# Patient Record
Sex: Female | Born: 1935 | Race: White | Hispanic: No | Smoking: Never smoker
Health system: Southern US, Community
[De-identification: ages and names within clinical notes are randomized; demographics above are authoritative.]

## PROBLEM LIST (undated history)

## (undated) DIAGNOSIS — E785 Hyperlipidemia, unspecified: Secondary | ICD-10-CM

## (undated) DIAGNOSIS — I614 Nontraumatic intracerebral hemorrhage in cerebellum: Secondary | ICD-10-CM

## (undated) DIAGNOSIS — I2510 Atherosclerotic heart disease of native coronary artery without angina pectoris: Secondary | ICD-10-CM

## (undated) DIAGNOSIS — S32599A Other specified fracture of unspecified pubis, initial encounter for closed fracture: Secondary | ICD-10-CM

## (undated) DIAGNOSIS — N6019 Diffuse cystic mastopathy of unspecified breast: Secondary | ICD-10-CM

## (undated) DIAGNOSIS — N182 Chronic kidney disease, stage 2 (mild): Secondary | ICD-10-CM

## (undated) DIAGNOSIS — I4891 Unspecified atrial fibrillation: Secondary | ICD-10-CM

## (undated) DIAGNOSIS — D126 Benign neoplasm of colon, unspecified: Secondary | ICD-10-CM

## (undated) DIAGNOSIS — I5032 Chronic diastolic (congestive) heart failure: Secondary | ICD-10-CM

## (undated) DIAGNOSIS — D509 Iron deficiency anemia, unspecified: Secondary | ICD-10-CM

## (undated) DIAGNOSIS — I071 Rheumatic tricuspid insufficiency: Secondary | ICD-10-CM

## (undated) DIAGNOSIS — I4821 Permanent atrial fibrillation: Secondary | ICD-10-CM

## (undated) DIAGNOSIS — I509 Heart failure, unspecified: Secondary | ICD-10-CM

## (undated) DIAGNOSIS — I272 Pulmonary hypertension, unspecified: Secondary | ICD-10-CM

## (undated) DIAGNOSIS — I82409 Acute embolism and thrombosis of unspecified deep veins of unspecified lower extremity: Secondary | ICD-10-CM

## (undated) DIAGNOSIS — C539 Malignant neoplasm of cervix uteri, unspecified: Secondary | ICD-10-CM

## (undated) DIAGNOSIS — K2270 Barrett's esophagus without dysplasia: Secondary | ICD-10-CM

## (undated) DIAGNOSIS — H3530 Unspecified macular degeneration: Secondary | ICD-10-CM

## (undated) DIAGNOSIS — I10 Essential (primary) hypertension: Secondary | ICD-10-CM

## (undated) DIAGNOSIS — S329XXA Fracture of unspecified parts of lumbosacral spine and pelvis, initial encounter for closed fracture: Secondary | ICD-10-CM

## (undated) HISTORY — DX: Nontraumatic intracerebral hemorrhage in cerebellum: I61.4

## (undated) HISTORY — DX: Heart failure, unspecified: I50.9

## (undated) HISTORY — DX: Fracture of unspecified parts of lumbosacral spine and pelvis, initial encounter for closed fracture: S32.9XXA

## (undated) HISTORY — DX: Benign neoplasm of colon, unspecified: D12.6

## (undated) HISTORY — DX: Essential (primary) hypertension: I10

## (undated) HISTORY — DX: Other specified fracture of unspecified pubis, initial encounter for closed fracture: S32.599A

## (undated) HISTORY — DX: Acute embolism and thrombosis of unspecified deep veins of unspecified lower extremity: I82.409

## (undated) HISTORY — DX: Malignant neoplasm of cervix uteri, unspecified: C53.9

## (undated) HISTORY — DX: Diffuse cystic mastopathy of unspecified breast: N60.19

## (undated) HISTORY — DX: Barrett's esophagus without dysplasia: K22.70

## (undated) HISTORY — DX: Iron deficiency anemia, unspecified: D50.9

## (undated) HISTORY — DX: Atherosclerotic heart disease of native coronary artery without angina pectoris: I25.10

## (undated) HISTORY — DX: Unspecified atrial fibrillation: I48.91

## (undated) HISTORY — DX: Hyperlipidemia, unspecified: E78.5

---

## 1962-10-04 HISTORY — PX: TOTAL ABDOMINAL HYSTERECTOMY: SHX209

## 2005-10-04 DIAGNOSIS — K2270 Barrett's esophagus without dysplasia: Secondary | ICD-10-CM

## 2005-10-04 DIAGNOSIS — D126 Benign neoplasm of colon, unspecified: Secondary | ICD-10-CM

## 2005-10-04 HISTORY — DX: Benign neoplasm of colon, unspecified: D12.6

## 2005-10-04 HISTORY — DX: Barrett's esophagus without dysplasia: K22.70

## 2006-08-09 ENCOUNTER — Encounter (HOSPITAL_COMMUNITY): Admission: RE | Admit: 2006-08-09 | Discharge: 2006-09-09 | Payer: Self-pay | Admitting: Internal Medicine

## 2006-09-05 ENCOUNTER — Ambulatory Visit (HOSPITAL_COMMUNITY): Admission: RE | Admit: 2006-09-05 | Discharge: 2006-09-05 | Payer: Self-pay | Admitting: *Deleted

## 2008-10-04 HISTORY — PX: CATARACT EXTRACTION, BILATERAL: SHX1313

## 2010-01-30 HISTORY — PX: CATARACT EXTRACTION: SUR2

## 2010-02-15 HISTORY — PX: CATARACT EXTRACTION: SUR2

## 2012-12-06 ENCOUNTER — Encounter: Payer: Self-pay | Admitting: Gastroenterology

## 2012-12-27 ENCOUNTER — Encounter: Payer: Self-pay | Admitting: Gastroenterology

## 2012-12-27 ENCOUNTER — Ambulatory Visit (INDEPENDENT_AMBULATORY_CARE_PROVIDER_SITE_OTHER): Payer: Medicare Other | Admitting: Gastroenterology

## 2012-12-27 VITALS — BP 120/64 | HR 88 | Ht 60.0 in | Wt 124.0 lb

## 2012-12-27 DIAGNOSIS — Z7901 Long term (current) use of anticoagulants: Secondary | ICD-10-CM

## 2012-12-27 DIAGNOSIS — Z1211 Encounter for screening for malignant neoplasm of colon: Secondary | ICD-10-CM

## 2012-12-27 DIAGNOSIS — Z86010 Personal history of colonic polyps: Secondary | ICD-10-CM

## 2012-12-27 NOTE — Progress Notes (Signed)
History of Present Illness: This is an 79 year old female previously followed by Dr. [name] Rochester and by Dr. Elnoria Howard. She has a history of one small adenomatous colon polyp found on colonoscopy in December 2007. Upper endoscopy performed at the same time showed a short segment of Barrett's esophagus without dysplasia. She saw Dr. Elnoria Howard about 2 years ago but did not follow through with recommended procedures. She has no gastrointestinal complaints. She is maintained on Coumadin for atrial fibrillation. Denies weight loss, abdominal pain, constipation, diarrhea, change in stool caliber, melena, hematochezia, nausea, vomiting, dysphagia, reflux symptoms, chest pain.  Review of Systems: Pertinent positive and negative review of systems were noted in the above HPI section. All other review of systems were otherwise negative.  Current Medications, Allergies, Past Medical History, Past Surgical History, Family History and Social History were reviewed in [employer] record.  Physical Exam: General: Well developed , well nourished, no acute distress Head: Normocephalic and atraumatic Eyes:  sclerae anicteric, EOMI Ears: Normal auditory acuity Mouth: No deformity or lesions Neck: Supple, no masses or thyromegaly Lungs: Clear throughout to auscultation Heart: Regular rate and rhythm; no murmurs, rubs or bruits Abdomen: Soft, non tender and non distended. No masses, hepatosplenomegaly or hernias noted. Normal Bowel sounds Musculoskeletal: Symmetrical with no gross deformities  Skin: No lesions on visible extremities Pulses:  Normal pulses noted Extremities: No clubbing, cyanosis, edema or deformities noted Neurological: Alert oriented x 4, grossly nonfocal Cervical Nodes:  No significant cervical adenopathy Inguinal Nodes: No significant inguinal adenopathy Psychological:  Alert and cooperative. Normal mood and affect  Assessment and Recommendations:  1. Personal history of one small  adenomatous colon polyp in 2007. Given her age of 54 and her comorbidities, including ongoing Coumadin anticoagulation, we discussed the pros and cons of continuing a polyp surveillance program. Adenomatous colon polyp surveillance programs and colorectal cancer screening programs generally stop at age 60 and she does not want to proceed with colonoscopy at this time. I believe this is a reasonable decision. I will see her back as needed.  2. Barrett's esophagus. Again for all the same reasons as above she elects not to proceed with surveillance endoscopy. I think this is a reasonable decision. I recommend that she begin omeprazole 20 mg daily long-term for treatment of presumed underlying GERD leading to Barrett's although she is currently asymptomatic. I will see her back as needed.

## 2012-12-27 NOTE — Patient Instructions (Signed)
Please follow up as needed.  Thank you for choosing Dr. Russella Dar and [facility].   Cc: Pearson Grippe, MD

## 2014-10-10 DIAGNOSIS — I4891 Unspecified atrial fibrillation: Secondary | ICD-10-CM | POA: Diagnosis not present

## 2014-10-10 DIAGNOSIS — Z7901 Long term (current) use of anticoagulants: Secondary | ICD-10-CM | POA: Diagnosis not present

## 2014-11-07 DIAGNOSIS — I4891 Unspecified atrial fibrillation: Secondary | ICD-10-CM | POA: Diagnosis not present

## 2014-11-07 DIAGNOSIS — Z7901 Long term (current) use of anticoagulants: Secondary | ICD-10-CM | POA: Diagnosis not present

## 2014-11-14 DIAGNOSIS — H4011X2 Primary open-angle glaucoma, moderate stage: Secondary | ICD-10-CM | POA: Diagnosis not present

## 2014-11-14 DIAGNOSIS — H4011X3 Primary open-angle glaucoma, severe stage: Secondary | ICD-10-CM | POA: Diagnosis not present

## 2014-11-26 DIAGNOSIS — H34831 Tributary (branch) retinal vein occlusion, right eye: Secondary | ICD-10-CM | POA: Diagnosis not present

## 2014-12-09 DIAGNOSIS — R739 Hyperglycemia, unspecified: Secondary | ICD-10-CM | POA: Diagnosis not present

## 2014-12-09 DIAGNOSIS — M810 Age-related osteoporosis without current pathological fracture: Secondary | ICD-10-CM | POA: Diagnosis not present

## 2014-12-09 DIAGNOSIS — Z7901 Long term (current) use of anticoagulants: Secondary | ICD-10-CM | POA: Diagnosis not present

## 2014-12-09 DIAGNOSIS — I10 Essential (primary) hypertension: Secondary | ICD-10-CM | POA: Diagnosis not present

## 2014-12-09 DIAGNOSIS — I4891 Unspecified atrial fibrillation: Secondary | ICD-10-CM | POA: Diagnosis not present

## 2014-12-12 DIAGNOSIS — I4891 Unspecified atrial fibrillation: Secondary | ICD-10-CM | POA: Diagnosis not present

## 2014-12-12 DIAGNOSIS — I10 Essential (primary) hypertension: Secondary | ICD-10-CM | POA: Diagnosis not present

## 2014-12-12 DIAGNOSIS — Z0000 Encounter for general adult medical examination without abnormal findings: Secondary | ICD-10-CM | POA: Diagnosis not present

## 2014-12-12 DIAGNOSIS — M810 Age-related osteoporosis without current pathological fracture: Secondary | ICD-10-CM | POA: Diagnosis not present

## 2015-01-09 DIAGNOSIS — Z7901 Long term (current) use of anticoagulants: Secondary | ICD-10-CM | POA: Diagnosis not present

## 2015-01-09 DIAGNOSIS — I4891 Unspecified atrial fibrillation: Secondary | ICD-10-CM | POA: Diagnosis not present

## 2015-01-13 DIAGNOSIS — H34831 Tributary (branch) retinal vein occlusion, right eye: Secondary | ICD-10-CM | POA: Diagnosis not present

## 2015-02-06 DIAGNOSIS — I4891 Unspecified atrial fibrillation: Secondary | ICD-10-CM | POA: Diagnosis not present

## 2015-02-06 DIAGNOSIS — Z7901 Long term (current) use of anticoagulants: Secondary | ICD-10-CM | POA: Diagnosis not present

## 2015-03-06 DIAGNOSIS — Z7901 Long term (current) use of anticoagulants: Secondary | ICD-10-CM | POA: Diagnosis not present

## 2015-03-06 DIAGNOSIS — I4891 Unspecified atrial fibrillation: Secondary | ICD-10-CM | POA: Diagnosis not present

## 2015-03-13 DIAGNOSIS — E780 Pure hypercholesterolemia: Secondary | ICD-10-CM | POA: Diagnosis not present

## 2015-03-13 DIAGNOSIS — I4891 Unspecified atrial fibrillation: Secondary | ICD-10-CM | POA: Diagnosis not present

## 2015-03-13 DIAGNOSIS — I10 Essential (primary) hypertension: Secondary | ICD-10-CM | POA: Diagnosis not present

## 2015-03-18 DIAGNOSIS — I10 Essential (primary) hypertension: Secondary | ICD-10-CM | POA: Diagnosis not present

## 2015-03-18 DIAGNOSIS — E780 Pure hypercholesterolemia: Secondary | ICD-10-CM | POA: Diagnosis not present

## 2015-03-18 DIAGNOSIS — I4891 Unspecified atrial fibrillation: Secondary | ICD-10-CM | POA: Diagnosis not present

## 2015-03-18 DIAGNOSIS — R739 Hyperglycemia, unspecified: Secondary | ICD-10-CM | POA: Diagnosis not present

## 2015-03-25 DIAGNOSIS — I4891 Unspecified atrial fibrillation: Secondary | ICD-10-CM | POA: Diagnosis not present

## 2015-04-08 DIAGNOSIS — I4891 Unspecified atrial fibrillation: Secondary | ICD-10-CM | POA: Diagnosis not present

## 2015-04-08 DIAGNOSIS — Z79899 Other long term (current) drug therapy: Secondary | ICD-10-CM | POA: Diagnosis not present

## 2015-04-10 DIAGNOSIS — Z7901 Long term (current) use of anticoagulants: Secondary | ICD-10-CM | POA: Diagnosis not present

## 2015-04-10 DIAGNOSIS — I4891 Unspecified atrial fibrillation: Secondary | ICD-10-CM | POA: Diagnosis not present

## 2015-04-17 DIAGNOSIS — Z1231 Encounter for screening mammogram for malignant neoplasm of breast: Secondary | ICD-10-CM | POA: Diagnosis not present

## 2015-04-23 DIAGNOSIS — N63 Unspecified lump in breast: Secondary | ICD-10-CM | POA: Diagnosis not present

## 2015-04-23 DIAGNOSIS — N6001 Solitary cyst of right breast: Secondary | ICD-10-CM | POA: Diagnosis not present

## 2015-04-24 DIAGNOSIS — H4011X3 Primary open-angle glaucoma, severe stage: Secondary | ICD-10-CM | POA: Diagnosis not present

## 2015-04-24 DIAGNOSIS — H4011X2 Primary open-angle glaucoma, moderate stage: Secondary | ICD-10-CM | POA: Diagnosis not present

## 2015-04-28 DIAGNOSIS — I4891 Unspecified atrial fibrillation: Secondary | ICD-10-CM | POA: Diagnosis not present

## 2015-04-28 DIAGNOSIS — Z7901 Long term (current) use of anticoagulants: Secondary | ICD-10-CM | POA: Diagnosis not present

## 2015-05-22 DIAGNOSIS — H4011X3 Primary open-angle glaucoma, severe stage: Secondary | ICD-10-CM | POA: Diagnosis not present

## 2015-05-26 DIAGNOSIS — Z7901 Long term (current) use of anticoagulants: Secondary | ICD-10-CM | POA: Diagnosis not present

## 2015-05-26 DIAGNOSIS — I4891 Unspecified atrial fibrillation: Secondary | ICD-10-CM | POA: Diagnosis not present

## 2015-06-05 DIAGNOSIS — H4011X2 Primary open-angle glaucoma, moderate stage: Secondary | ICD-10-CM | POA: Diagnosis not present

## 2015-06-12 DIAGNOSIS — I10 Essential (primary) hypertension: Secondary | ICD-10-CM | POA: Diagnosis not present

## 2015-06-12 DIAGNOSIS — M810 Age-related osteoporosis without current pathological fracture: Secondary | ICD-10-CM | POA: Diagnosis not present

## 2015-06-12 DIAGNOSIS — I4891 Unspecified atrial fibrillation: Secondary | ICD-10-CM | POA: Diagnosis not present

## 2015-06-18 DIAGNOSIS — I480 Paroxysmal atrial fibrillation: Secondary | ICD-10-CM | POA: Diagnosis not present

## 2015-06-18 DIAGNOSIS — M810 Age-related osteoporosis without current pathological fracture: Secondary | ICD-10-CM | POA: Diagnosis not present

## 2015-06-18 DIAGNOSIS — E780 Pure hypercholesterolemia: Secondary | ICD-10-CM | POA: Diagnosis not present

## 2015-06-18 DIAGNOSIS — I10 Essential (primary) hypertension: Secondary | ICD-10-CM | POA: Diagnosis not present

## 2015-06-23 DIAGNOSIS — Z7901 Long term (current) use of anticoagulants: Secondary | ICD-10-CM | POA: Diagnosis not present

## 2015-06-23 DIAGNOSIS — I4891 Unspecified atrial fibrillation: Secondary | ICD-10-CM | POA: Diagnosis not present

## 2015-07-24 DIAGNOSIS — I4891 Unspecified atrial fibrillation: Secondary | ICD-10-CM | POA: Diagnosis not present

## 2015-07-24 DIAGNOSIS — Z7901 Long term (current) use of anticoagulants: Secondary | ICD-10-CM | POA: Diagnosis not present

## 2015-07-25 DIAGNOSIS — H401122 Primary open-angle glaucoma, left eye, moderate stage: Secondary | ICD-10-CM | POA: Diagnosis not present

## 2015-07-25 DIAGNOSIS — H401113 Primary open-angle glaucoma, right eye, severe stage: Secondary | ICD-10-CM | POA: Diagnosis not present

## 2015-08-14 DIAGNOSIS — H401113 Primary open-angle glaucoma, right eye, severe stage: Secondary | ICD-10-CM | POA: Diagnosis not present

## 2015-08-14 DIAGNOSIS — H401122 Primary open-angle glaucoma, left eye, moderate stage: Secondary | ICD-10-CM | POA: Diagnosis not present

## 2015-08-21 DIAGNOSIS — I4891 Unspecified atrial fibrillation: Secondary | ICD-10-CM | POA: Diagnosis not present

## 2015-08-21 DIAGNOSIS — Z7901 Long term (current) use of anticoagulants: Secondary | ICD-10-CM | POA: Diagnosis not present

## 2015-09-17 DIAGNOSIS — H34831 Tributary (branch) retinal vein occlusion, right eye, with macular edema: Secondary | ICD-10-CM | POA: Diagnosis not present

## 2015-09-17 DIAGNOSIS — H353132 Nonexudative age-related macular degeneration, bilateral, intermediate dry stage: Secondary | ICD-10-CM | POA: Diagnosis not present

## 2015-09-17 DIAGNOSIS — H35351 Cystoid macular degeneration, right eye: Secondary | ICD-10-CM | POA: Diagnosis not present

## 2015-09-17 DIAGNOSIS — H3581 Retinal edema: Secondary | ICD-10-CM | POA: Diagnosis not present

## 2015-09-18 DIAGNOSIS — I4891 Unspecified atrial fibrillation: Secondary | ICD-10-CM | POA: Diagnosis not present

## 2015-09-18 DIAGNOSIS — Z7901 Long term (current) use of anticoagulants: Secondary | ICD-10-CM | POA: Diagnosis not present

## 2015-11-18 DIAGNOSIS — Z7901 Long term (current) use of anticoagulants: Secondary | ICD-10-CM | POA: Diagnosis not present

## 2015-11-18 DIAGNOSIS — I4891 Unspecified atrial fibrillation: Secondary | ICD-10-CM | POA: Diagnosis not present

## 2015-11-24 DIAGNOSIS — H34831 Tributary (branch) retinal vein occlusion, right eye, with macular edema: Secondary | ICD-10-CM | POA: Diagnosis not present

## 2015-11-24 DIAGNOSIS — H35351 Cystoid macular degeneration, right eye: Secondary | ICD-10-CM | POA: Diagnosis not present

## 2015-11-24 DIAGNOSIS — H353132 Nonexudative age-related macular degeneration, bilateral, intermediate dry stage: Secondary | ICD-10-CM | POA: Diagnosis not present

## 2015-12-09 DIAGNOSIS — N63 Unspecified lump in breast: Secondary | ICD-10-CM | POA: Diagnosis not present

## 2015-12-11 DIAGNOSIS — H401113 Primary open-angle glaucoma, right eye, severe stage: Secondary | ICD-10-CM | POA: Diagnosis not present

## 2015-12-11 DIAGNOSIS — H401122 Primary open-angle glaucoma, left eye, moderate stage: Secondary | ICD-10-CM | POA: Diagnosis not present

## 2015-12-17 DIAGNOSIS — M810 Age-related osteoporosis without current pathological fracture: Secondary | ICD-10-CM | POA: Diagnosis not present

## 2015-12-17 DIAGNOSIS — I10 Essential (primary) hypertension: Secondary | ICD-10-CM | POA: Diagnosis not present

## 2015-12-17 DIAGNOSIS — N390 Urinary tract infection, site not specified: Secondary | ICD-10-CM | POA: Diagnosis not present

## 2015-12-22 DIAGNOSIS — I4891 Unspecified atrial fibrillation: Secondary | ICD-10-CM | POA: Diagnosis not present

## 2015-12-22 DIAGNOSIS — Z7901 Long term (current) use of anticoagulants: Secondary | ICD-10-CM | POA: Diagnosis not present

## 2015-12-24 DIAGNOSIS — I480 Paroxysmal atrial fibrillation: Secondary | ICD-10-CM | POA: Diagnosis not present

## 2015-12-24 DIAGNOSIS — I10 Essential (primary) hypertension: Secondary | ICD-10-CM | POA: Diagnosis not present

## 2015-12-24 DIAGNOSIS — M810 Age-related osteoporosis without current pathological fracture: Secondary | ICD-10-CM | POA: Diagnosis not present

## 2016-01-05 DIAGNOSIS — I4891 Unspecified atrial fibrillation: Secondary | ICD-10-CM | POA: Diagnosis not present

## 2016-01-05 DIAGNOSIS — Z7901 Long term (current) use of anticoagulants: Secondary | ICD-10-CM | POA: Diagnosis not present

## 2016-02-05 DIAGNOSIS — Z7901 Long term (current) use of anticoagulants: Secondary | ICD-10-CM | POA: Diagnosis not present

## 2016-02-05 DIAGNOSIS — I4891 Unspecified atrial fibrillation: Secondary | ICD-10-CM | POA: Diagnosis not present

## 2016-02-15 ENCOUNTER — Emergency Department (HOSPITAL_COMMUNITY): Payer: Medicare Other

## 2016-02-15 ENCOUNTER — Observation Stay (HOSPITAL_COMMUNITY)
Admission: EM | Admit: 2016-02-15 | Discharge: 2016-02-18 | Disposition: A | Discharge status: Skilled Nursing Facility | Payer: Medicare Other | Attending: Internal Medicine | Admitting: Internal Medicine

## 2016-02-15 ENCOUNTER — Encounter (HOSPITAL_COMMUNITY): Payer: Self-pay | Admitting: *Deleted

## 2016-02-15 DIAGNOSIS — E785 Hyperlipidemia, unspecified: Secondary | ICD-10-CM | POA: Insufficient documentation

## 2016-02-15 DIAGNOSIS — K2270 Barrett's esophagus without dysplasia: Secondary | ICD-10-CM | POA: Diagnosis not present

## 2016-02-15 DIAGNOSIS — Y9222 Religious institution as the place of occurrence of the external cause: Secondary | ICD-10-CM | POA: Insufficient documentation

## 2016-02-15 DIAGNOSIS — M25551 Pain in right hip: Secondary | ICD-10-CM | POA: Diagnosis not present

## 2016-02-15 DIAGNOSIS — S32599A Other specified fracture of unspecified pubis, initial encounter for closed fracture: Secondary | ICD-10-CM | POA: Diagnosis present

## 2016-02-15 DIAGNOSIS — S32810G Multiple fractures of pelvis with stable disruption of pelvic ring, subsequent encounter for fracture with delayed healing: Secondary | ICD-10-CM | POA: Diagnosis present

## 2016-02-15 DIAGNOSIS — Y9389 Activity, other specified: Secondary | ICD-10-CM | POA: Insufficient documentation

## 2016-02-15 DIAGNOSIS — Z888 Allergy status to other drugs, medicaments and biological substances status: Secondary | ICD-10-CM | POA: Insufficient documentation

## 2016-02-15 DIAGNOSIS — Z7901 Long term (current) use of anticoagulants: Secondary | ICD-10-CM | POA: Insufficient documentation

## 2016-02-15 DIAGNOSIS — I4892 Unspecified atrial flutter: Secondary | ICD-10-CM | POA: Diagnosis not present

## 2016-02-15 DIAGNOSIS — Z8541 Personal history of malignant neoplasm of cervix uteri: Secondary | ICD-10-CM | POA: Insufficient documentation

## 2016-02-15 DIAGNOSIS — S32591A Other specified fracture of right pubis, initial encounter for closed fracture: Principal | ICD-10-CM | POA: Insufficient documentation

## 2016-02-15 DIAGNOSIS — I5032 Chronic diastolic (congestive) heart failure: Secondary | ICD-10-CM | POA: Insufficient documentation

## 2016-02-15 DIAGNOSIS — I4891 Unspecified atrial fibrillation: Secondary | ICD-10-CM | POA: Insufficient documentation

## 2016-02-15 DIAGNOSIS — Y998 Other external cause status: Secondary | ICD-10-CM | POA: Diagnosis not present

## 2016-02-15 DIAGNOSIS — Z880 Allergy status to penicillin: Secondary | ICD-10-CM | POA: Insufficient documentation

## 2016-02-15 DIAGNOSIS — I700 Atherosclerosis of aorta: Secondary | ICD-10-CM | POA: Diagnosis not present

## 2016-02-15 DIAGNOSIS — S79911A Unspecified injury of right hip, initial encounter: Secondary | ICD-10-CM | POA: Diagnosis not present

## 2016-02-15 DIAGNOSIS — I110 Hypertensive heart disease with heart failure: Secondary | ICD-10-CM | POA: Insufficient documentation

## 2016-02-15 DIAGNOSIS — S32511A Fracture of superior rim of right pubis, initial encounter for closed fracture: Secondary | ICD-10-CM | POA: Diagnosis not present

## 2016-02-15 DIAGNOSIS — W010XXA Fall on same level from slipping, tripping and stumbling without subsequent striking against object, initial encounter: Secondary | ICD-10-CM | POA: Diagnosis not present

## 2016-02-15 DIAGNOSIS — S329XXA Fracture of unspecified parts of lumbosacral spine and pelvis, initial encounter for closed fracture: Secondary | ICD-10-CM | POA: Diagnosis present

## 2016-02-15 LAB — BASIC METABOLIC PANEL
Anion gap: 12 (ref 5–15)
BUN: 12 mg/dL (ref 6–20)
CO2: 21 mmol/L — ABNORMAL LOW (ref 22–32)
Calcium: 9.4 mg/dL (ref 8.9–10.3)
Chloride: 107 mmol/L (ref 101–111)
Creatinine, Ser: 0.7 mg/dL (ref 0.44–1.00)
GFR calc Af Amer: >60 mL/min (ref >60)
GFR calc non Af Amer: >60 mL/min (ref >60)
Glucose, Bld: 117 mg/dL — ABNORMAL HIGH (ref 65–99)
Potassium: 3.8 mmol/L (ref 3.5–5.1)
Sodium: 140 mmol/L (ref 135–145)

## 2016-02-15 LAB — CBC WITH DIFFERENTIAL/PLATELET
Basophils Absolute: 0 10*3/uL (ref 0.0–0.1)
Basophils Relative: 0 %
Eosinophils Absolute: 0 10*3/uL (ref 0.0–0.7)
Eosinophils Relative: 0 %
HCT: 41.3 % (ref 36.0–46.0)
Hemoglobin: 13.9 g/dL (ref 12.0–15.0)
Lymphocytes Relative: 13 %
Lymphs Abs: 1.1 10*3/uL (ref 0.7–4.0)
MCH: 30.9 pg (ref 26.0–34.0)
MCHC: 33.7 g/dL (ref 30.0–36.0)
MCV: 91.8 fL (ref 78.0–100.0)
Monocytes Absolute: 1 10*3/uL (ref 0.1–1.0)
Monocytes Relative: 11 %
Neutro Abs: 6.8 10*3/uL (ref 1.7–7.7)
Neutrophils Relative %: 76 %
Platelets: 193 10*3/uL (ref 150–400)
RBC: 4.5 MIL/uL (ref 3.87–5.11)
RDW: 12.9 % (ref 11.5–15.5)
WBC: 9 10*3/uL (ref 4.0–10.5)

## 2016-02-15 LAB — PROTIME-INR
INR: 2.13 — ABNORMAL HIGH (ref 0.00–1.49)
Prothrombin Time: 23.7 seconds — ABNORMAL HIGH (ref 11.6–15.2)

## 2016-02-15 MED ORDER — ADULT MULTIVITAMIN W/MINERALS CH
1.0000 | ORAL_TABLET | Freq: Every day | ORAL | Status: DC
  Administered 2016-02-16 – 2016-02-18 (×3): 1 via ORAL
  Filled 2016-02-15 (×3): qty 1

## 2016-02-15 MED ORDER — AMLODIPINE BESYLATE 5 MG PO TABS
5.0000 mg | ORAL_TABLET | Freq: Every day | ORAL | Status: DC
  Administered 2016-02-16 – 2016-02-18 (×3): 5 mg via ORAL
  Filled 2016-02-15 (×3): qty 1

## 2016-02-15 MED ORDER — CALCIUM CARBONATE 600 MG PO TABS: 600.0000 mg | ORAL_TABLET | Freq: Every day | ORAL | Status: DC

## 2016-02-15 MED ORDER — ATORVASTATIN CALCIUM 20 MG PO TABS
20.0000 mg | ORAL_TABLET | Freq: Every day | ORAL | Status: DC
  Administered 2016-02-16 – 2016-02-18 (×3): 20 mg via ORAL
  Filled 2016-02-15 (×3): qty 1

## 2016-02-15 MED ORDER — SODIUM CHLORIDE 0.9 % IV SOLN
Freq: Once | INTRAVENOUS | Status: AC
  Administered 2016-02-15: 19:00:00 via INTRAVENOUS

## 2016-02-15 MED ORDER — FUROSEMIDE 20 MG PO TABS
20.0000 mg | ORAL_TABLET | Freq: Every day | ORAL | Status: DC
  Administered 2016-02-16 – 2016-02-18 (×3): 20 mg via ORAL
  Filled 2016-02-15 (×3): qty 1

## 2016-02-15 MED ORDER — TRAMADOL HCL 50 MG PO TABS
50.0000 mg | ORAL_TABLET | Freq: Once | ORAL | Status: AC
  Administered 2016-02-15: 50 mg via ORAL
  Filled 2016-02-15: qty 1

## 2016-02-15 MED ORDER — WARFARIN SODIUM 2 MG PO TABS: 2.0000 mg | ORAL_TABLET | Freq: Every morning | ORAL | Status: DC

## 2016-02-15 MED ORDER — DILTIAZEM HCL ER COATED BEADS 300 MG PO CP24
300.0000 mg | ORAL_CAPSULE | Freq: Every day | ORAL | Status: DC
Start: 2016-02-15 — End: 2016-02-18
  Administered 2016-02-16 – 2016-02-18 (×3): 300 mg via ORAL
  Filled 2016-02-15 (×4): qty 1

## 2016-02-15 MED ORDER — WARFARIN SODIUM 2 MG PO TABS
2.0000 mg | ORAL_TABLET | ORAL | Status: DC
  Administered 2016-02-16: 2 mg via ORAL
  Filled 2016-02-15 (×2): qty 1

## 2016-02-15 MED ORDER — DIGOXIN 125 MCG PO TABS
0.1250 mg | ORAL_TABLET | Freq: Every day | ORAL | Status: DC
  Administered 2016-02-17 – 2016-02-18 (×2): 0.125 mg via ORAL
  Filled 2016-02-15 (×3): qty 1

## 2016-02-15 MED ORDER — MORPHINE SULFATE (PF) 2 MG/ML IV SOLN: 2.0000 mg | INTRAVENOUS | Status: DC | PRN

## 2016-02-15 MED ORDER — WARFARIN SODIUM 3 MG PO TABS
3.0000 mg | ORAL_TABLET | ORAL | Status: DC
  Administered 2016-02-17: 3 mg via ORAL
  Filled 2016-02-15: qty 1

## 2016-02-15 MED ORDER — ALENDRONATE SODIUM 70 MG PO TABS: 70.0000 mg | ORAL_TABLET | ORAL | Status: DC

## 2016-02-15 MED ORDER — DILTIAZEM HCL 25 MG/5ML IV SOLN
10.0000 mg | Freq: Once | INTRAVENOUS | Status: AC
  Administered 2016-02-15: 10 mg via INTRAVENOUS
  Filled 2016-02-15: qty 5

## 2016-02-15 MED ORDER — ATORVASTATIN CALCIUM 20 MG PO TABS: 20.0000 mg | ORAL_TABLET | Freq: Every day | ORAL | Status: DC

## 2016-02-15 MED ORDER — LISINOPRIL 10 MG PO TABS
30.0000 mg | ORAL_TABLET | Freq: Every day | ORAL | Status: DC
  Administered 2016-02-16 – 2016-02-18 (×3): 30 mg via ORAL
  Filled 2016-02-15 (×2): qty 1

## 2016-02-15 MED ORDER — WARFARIN - PHYSICIAN DOSING INPATIENT
Freq: Every day | Status: DC
  Administered 2016-02-16: 18:00:00

## 2016-02-15 MED ORDER — ACETAMINOPHEN 500 MG PO TABS
500.0000 mg | ORAL_TABLET | Freq: Four times a day (QID) | ORAL | Status: DC | PRN
  Administered 2016-02-16 – 2016-02-18 (×7): 500 mg via ORAL
  Filled 2016-02-15 (×7): qty 1

## 2016-02-15 MED ORDER — ACETAMINOPHEN 325 MG PO TABS
650.0000 mg | ORAL_TABLET | Freq: Once | ORAL | Status: AC
  Administered 2016-02-15: 650 mg via ORAL
  Filled 2016-02-15: qty 2

## 2016-02-15 MED ORDER — DORZOLAMIDE HCL 2 % OP SOLN
1.0000 [drp] | Freq: Two times a day (BID) | OPHTHALMIC | Status: DC
  Administered 2016-02-15 – 2016-02-18 (×6): 1 [drp] via OPHTHALMIC
  Filled 2016-02-15: qty 10

## 2016-02-15 MED ORDER — LATANOPROST 0.005 % OP SOLN
1.0000 [drp] | Freq: Every day | OPHTHALMIC | Status: DC
  Administered 2016-02-15 – 2016-02-17 (×3): 1 [drp] via OPHTHALMIC
  Filled 2016-02-15: qty 2.5

## 2016-02-15 MED ORDER — AMLODIPINE BESYLATE 5 MG PO TABS: 5.0000 mg | ORAL_TABLET | Freq: Every day | ORAL | Status: DC

## 2016-02-15 NOTE — ED Notes (Signed)
Attempted to ambulate pt.  Pt unable to get up from bed with out severe pain.  MD made aware.

## 2016-02-15 NOTE — ED Notes (Signed)
Pt st[place] a [employer] and fell on right hip on Thursday.  Pt states it got better and she fell this am when the chair came out from under her.   Hurts to stand on it and reports pain to groin.  Not sure if she hit head today

## 2016-02-15 NOTE — ED Provider Notes (Signed)
CSN: [id number]     Arrival date & time 02/15/16  1241 History   First MD Initiated Contact with Patient 02/15/16 1458     Chief Complaint  Patient presents with  . Hip Pain     (Consider location/radiation/quality/duration/timing/severity/associated sxs/prior Treatment) The history is provided by the patient, medical records and a relative. No language interpreter was used.   Patient is a 82 year old female with history Osteoporosis, atrial fibrillation who presents with right hip pain after 2 mechanical falls. Patient first fell 4 days ago while shopping. She landed on her right hip. Denies LOC, head injury or other injuries. She had some hip discomfort after this fall but has been able to ambulate and walk up and down stairs since then. Today she fell again while at Sunday school. She was standing up from a table holding onto a chair when the chair pushed out from underneath her hands and she fell onto her right side. All was witnessed by daughter, who reports no loss of consciousness and no head injury. She was able to stand after her fall, but continued to have significant pain with weightbearing and ambulation. Pain is located mostly in her right anterior hip and groin area. Pain is worse with weightbearing. It is relieved with rest. She has not had prior injury or surgery in her right hip.  Past Medical History  Diagnosis Date  . Atrial fibrillation ([place])   . Hypertension   . Hyperlipidemia   . Cerebellar hemorrhage ([place])   . Barrett's esophagus 2007  . Adenomatous colon polyp 2007  . Fibrocystic breast disease   . Cervical cancer ([place])   . Iron deficiency anemia    Past Surgical History  Procedure Laterality Date  . Cataract extraction, bilateral  2010  . Total abdominal hysterectomy  1964   Family History  Problem Relation Age of Onset  . CVA Father   . CVA Mother    Social History  Substance Use Topics  . Smoking status: Never Smoker   . Smokeless tobacco: Never Used   . Alcohol Use: No   OB History    No data available     Review of Systems  Constitutional: Negative for fever and chills.  HENT: Negative for congestion and rhinorrhea.   Eyes: Negative for visual disturbance.  Respiratory: Negative for cough and shortness of breath.   Cardiovascular: Negative for chest pain and leg swelling.  Gastrointestinal: Negative for nausea, vomiting and abdominal pain.  Genitourinary: Negative for dysuria and difficulty urinating.  Musculoskeletal: Positive for gait problem.  Skin: Negative for pallor, rash and wound.  Neurological: Negative for dizziness and headaches.  Psychiatric/Behavioral: Negative for confusion.      Allergies  Penicillins and Zocor  Home Medications   Prior to Admission medications   Medication Sig Start Date End Date Taking? Authorizing Provider  acetaminophen (TYLENOL) 500 MG tablet Take 500 mg by mouth every 6 (six) hours as needed for pain.    Historical Provider, MD  amLODipine (NORVASC) 5 MG tablet Take 5 mg by mouth daily.    Historical Provider, MD  atorvastatin (LIPITOR) 20 MG tablet Take 20 mg by mouth daily.    Historical Provider, MD  digoxin (LANOXIN) 0.25 MG tablet Take 0.25 mg by mouth daily. 1 1/2 tablets on Tuesday and Saturday    Historical Provider, MD  diltiazem (CARDIZEM CD) 300 MG 24 hr capsule Take 300 mg by mouth daily.    Historical Provider, MD  dorzolamide (TRUSOPT) 2 % ophthalmic solution  1 drop 2 (two) times daily.    Historical Provider, MD  Ferrous Sulfate (IRON) 325 (65 FE) MG TABS Take 1 tablet by mouth daily.    Historical Provider, MD  furosemide (LASIX) 20 MG tablet Take 20 mg by mouth 2 (two) times daily.    Historical Provider, MD  latanoprost (XALATAN) 0.005 % ophthalmic solution 1 drop at bedtime.    Historical Provider, MD  lisinopril (PRINIVIL,ZESTRIL) 20 MG tablet Take 20 mg by mouth daily.    Historical Provider, MD  methocarbamol (ROBAXIN) 500 MG tablet Take 500 mg by mouth 4 (four)  times daily.    Historical Provider, MD  Multiple Vitamin (MULTIVITAMIN) tablet Take 1 tablet by mouth daily.    Historical Provider, MD  warfarin (COUMADIN) 2 MG tablet Take 2 mg by mouth daily.    Historical Provider, MD   BP 141/83 mmHg  Pulse 103  Temp(Src) 97.3 F (36.3 C) (Oral)  Resp 18  SpO2 97% Physical Exam  Constitutional: She is oriented to person, place, and time. She appears well-developed and well-nourished. No distress.  HENT:  Head: Normocephalic and atraumatic.  Eyes: EOM are normal. Pupils are equal, round, and reactive to light.  Neck: Normal range of motion. Neck supple.  Cardiovascular: Normal rate and intact distal pulses.  An irregularly irregular rhythm present.  Pulmonary/Chest: Effort normal and breath sounds normal. No respiratory distress.  Abdominal: Soft. She exhibits no distension. There is no tenderness.  Musculoskeletal: Normal range of motion. She exhibits no edema or tenderness.  Full passive ROM of right hip and knee without TTP. TTP with palpation over right anterior hip and groin. Distal pulses 2+.   Neurological: She is alert and oriented to person, place, and time.  Skin: Skin is warm and dry. No rash noted.  Psychiatric: She has a normal mood and affect.  Nursing note and vitals reviewed.   ED Course  Procedures (including critical care time) Labs Review Labs Reviewed  BASIC METABOLIC PANEL - Abnormal; Notable for the following:    CO2 21 (*)    Glucose, Bld 117 (*)    All other components within normal limits  CBC WITH DIFFERENTIAL/PLATELET  PROTIME-INR    Imaging Review Ct Hip Right Wo Contrast  02/15/2016  CLINICAL DATA:  Status post fall today and 2 days ago. Pain along the lateral aspect. EXAM: CT OF THE RIGHT HIP WITHOUT CONTRAST TECHNIQUE: Multidetector CT imaging of the right hip was performed according to the standard protocol. Multiplanar CT image reconstructions were also generated. COMPARISON:  None. FINDINGS: There is a  nondisplaced mildly comminuted fracture of the right superior pubic ramus. There is no other fracture or dislocation. There is no aggressive osseous lesion. There is no lytic or sclerotic osseous lesion. There is severe right facet arthropathy at L4-5 and L5-S1. The loss poles are normal. There is no muscle atrophy. There is peripheral vascular atherosclerotic disease. There is abdominal aortic atherosclerosis. There is no right inguinal hernia. There is no inguinal lymphadenopathy. IMPRESSION: 1. Nondisplaced mildly comminuted fracture of the right superior pubic ramus. Electronically Signed   By: Kathreen Devoid   On: 02/15/2016 16:32   Dg Hip Unilat  With Pelvis 2-3 Views Right  02/15/2016  CLINICAL DATA:  Fall.  Right hip pain EXAM: DG HIP (WITH OR WITHOUT PELVIS) 2-3V RIGHT COMPARISON:  None. FINDINGS: Hip joint is normal.  No fracture the femur. Negative for pelvic fracture.  Arterial calcification. IMPRESSION: Negative for fracture Electronically Signed   By:  Franchot Gallo M.D.   On: 02/15/2016 14:43   I have personally reviewed and evaluated these images and lab results as part of my medical decision-making.   EKG Interpretation None      MDM   Final diagnoses:  Atrial fibrillation with RVR (HCC)  Acute right hip pain    82 year old female past medical history of atrial fibrillation presents after mechanical fall with right hip pain. Denies head injury, loss of consciousness or headache. Initial x-ray imaging negative for hip fracture. However, patient continues to have pain to palpation over her anterior hip and groin area. CT imaging of right hip was performed and shows a nondisplaced fracture of the right superior pubic rami. Attempted to give the patient in the ED that she is unable to bear weight secondary to pain. Pain medicine was given and hospitalist is consulted for admission and triage of the patient. Also discussed patient with orthopedic surgery on call who recommended MRI as an  inpatient if she continues to have pain with weightbearing. While awaiting admission patient was found to be in atrial fibrillation with RVR. She is not hypotensive during this episode. She was given a diltiazem bolus with improvement in her heart rate. Admitted without further issues during my care.  Discussed with Dr. Reather Converse, ED attending  Gibson Ramp, MD 02/16/16 [id number]  Elnora Morrison, MD 02/16/16 [phone]

## 2016-02-15 NOTE — H&P (Signed)
History and Physical  Kelli Peterson [id number] DOB: [birth year] DOA: 02/15/2016  PCP:  Jani Gravel, MD   Chief Complaint:  fall  History of Present Illness:  Patient is a 82 yo female with history of afib, HTN, who came with cc of fall today at Sunday school in [place]. She said while she was trying to reach out to chair she fell landing on her right hip. She then had pain in her right hip pain. She denied any prior symptoms of chest pain, dyspnea, palpitations, dizziness, nausea or weakness/sensory deficits. She had a fall last Thursday and 1-2 falls since January. She ambulate well usually without assistance or devices. But she said she has had difficulty with balance since her stroke 20 years ago.   Review of Systems:  CONSTITUTIONAL:     No night sweats.  No fatigue.  No fever. No chills. Eyes:                            No visual changes.  No eye pain.  No eye discharge.   ENT:                              No epistaxis.  No sinus pain.  No sore throat.   No congestion. RESPIRATORY:           No cough.  No wheeze.  No hemoptysis.  No dyspnea CARDIOVASCULAR   :  No chest pains.  No palpitations. GASTROINTESTINAL:  No abdominal pain.  No nausea. No vomiting.  No diarrhea. No constipation.  No hematemesis.  No hematochezia.  No melena. GENITOURINARY:      No urgency.  No frequency.  No dysuria.  No hematuria.  No obstructive symptoms.  No discharge.  No pain.   MUSCULOSKELETAL:  +musculoskeletal pain.  No joint swelling.  No arthritis. NEUROLOGICAL:        No confusion.  No weakness. No headache. No seizure. PSYCHIATRIC:             No depression. No anxiety. No suicidal ideation. SKIN:                             No rashes.  No lesions.  No wounds. ENDOCRINE:                No weight loss.  No polydipsia.  No polyuria.  No polyphagia. HEMATOLOGIC:           No purpura.  No petechiae.  No bleeding.  ALLERGIC                 : No pruritus.  No angioedema Other:  Past Medical  and Surgical History:   Past Medical History  Diagnosis Date  . Atrial fibrillation ([place])   . Hypertension   . Hyperlipidemia   . Cerebellar hemorrhage ([place])   . Barrett's esophagus 2007  . Adenomatous colon polyp 2007  . Fibrocystic breast disease   . Cervical cancer ([place])   . Iron deficiency anemia    Past Surgical History  Procedure Laterality Date  . Cataract extraction, bilateral  2010  . Total abdominal hysterectomy  1964    Social History:   reports that she has never smoked. She has never used smokeless tobacco. She reports that she does not drink alcohol or use illicit drugs.  Allergies  Allergen Reactions  . Penicillins Other (See Comments)  . Zocor [Simvastatin] Other (See Comments)    Weak and dizzy    Family History  Problem Relation Age of Onset  . CVA Father   . CVA Mother       Prior to Admission medications   Medication Sig Start Date End Date Taking? Authorizing Provider  acetaminophen (TYLENOL) 500 MG tablet Take 500 mg by mouth every 6 (six) hours as needed for pain.   Yes Historical Provider, MD  alendronate (FOSAMAX) 70 MG tablet Take 70 mg by mouth every Wednesday. Take with a full glass of water on an empty stomach.   Yes Historical Provider, MD  amLODipine (NORVASC) 5 MG tablet Take 5 mg by mouth daily.   Yes Historical Provider, MD  atorvastatin (LIPITOR) 20 MG tablet Take 20 mg by mouth daily.   Yes Historical Provider, MD  calcium carbonate (CALCIUM 600) 600 MG TABS tablet Take 600 mg by mouth daily with breakfast.   Yes Historical Provider, MD  digoxin (LANOXIN) 0.25 MG tablet Take 0.125 mg by mouth daily.    Yes Historical Provider, MD  diltiazem (CARDIZEM CD) 300 MG 24 hr capsule Take 300 mg by mouth daily.   Yes Historical Provider, MD  dorzolamide (TRUSOPT) 2 % ophthalmic solution Place 1 drop into both eyes 2 (two) times daily.    Yes Historical Provider, MD  Ferrous Sulfate (SLOW RELEASE IRON PO) Take 1 tablet by mouth daily.   Yes  Historical Provider, MD  furosemide (LASIX) 20 MG tablet Take 20 mg by mouth daily.    Yes Historical Provider, MD  latanoprost (XALATAN) 0.005 % ophthalmic solution Place 1 drop into both eyes at bedtime.    Yes Historical Provider, MD  lisinopril (PRINIVIL,ZESTRIL) 20 MG tablet Take 30 mg by mouth daily.    Yes Historical Provider, MD  Multiple Vitamin (MULTIVITAMIN WITH MINERALS) TABS tablet Take 1 tablet by mouth daily.   Yes Historical Provider, MD  Multiple Vitamin (MULTIVITAMIN) tablet Take 1 tablet by mouth daily.   Yes Historical Provider, MD  Omega-3 Fatty Acids (FISH OIL PO) Take 1 capsule by mouth daily.   Yes Historical Provider, MD  warfarin (COUMADIN) 2 MG tablet Take 2-3 mg by mouth every morning. Takes 1.5 tabs on tues, thurs and sat Takes 1 tab all other days   Yes Historical Provider, MD    Physical Exam: BP 169/95 mmHg  Pulse 90  Temp(Src) 97.3 F (36.3 C) (Oral)  Resp 18  SpO2 94%  GENERAL :   Alert and cooperative, and appears to be in no acute distress. HEAD:           normocephalic. EYES:            PERRL, EOMI.  vision is grossly intact. EARS:           hearing grossly intact. NOSE:           No nasal discharge. CARDIAC:    Irregular rythem Vascular:     no peripheral edema.  ABDOMEN:  Soft, nondistended, nontender. No guarding or rebound.      MSK:           Pain on flexing right hip EXT           : No significant deformity or joint abnormality. Neuro        : Alert, oriented to person, place, and time.  CN II-XII intact.  SKIN:            No rash. No lesions. PSYCH:       No hallucination. Patient is not suicidal.          Labs on Admission:  Reviewed.   Radiological Exams on Admission: Ct Hip Right Wo Contrast  02/15/2016  CLINICAL DATA:  Status post fall today and 2 days ago. Pain along the lateral aspect. EXAM: CT OF THE RIGHT HIP WITHOUT CONTRAST TECHNIQUE: Multidetector CT imaging of the right hip was performed according to  the standard protocol. Multiplanar CT image reconstructions were also generated. COMPARISON:  None. FINDINGS: There is a nondisplaced mildly comminuted fracture of the right superior pubic ramus. There is no other fracture or dislocation. There is no aggressive osseous lesion. There is no lytic or sclerotic osseous lesion. There is severe right facet arthropathy at L4-5 and L5-S1. The loss poles are normal. There is no muscle atrophy. There is peripheral vascular atherosclerotic disease. There is abdominal aortic atherosclerosis. There is no right inguinal hernia. There is no inguinal lymphadenopathy. IMPRESSION: 1. Nondisplaced mildly comminuted fracture of the right superior pubic ramus. Electronically Signed   By: Kathreen Devoid   On: 02/15/2016 16:32   Dg Hip Unilat  With Pelvis 2-3 Views Right  02/15/2016  CLINICAL DATA:  Fall.  Right hip pain EXAM: DG HIP (WITH OR WITHOUT PELVIS) 2-3V RIGHT COMPARISON:  None. FINDINGS: Hip joint is normal.  No fracture the femur. Negative for pelvic fracture.  Arterial calcification. IMPRESSION: Negative for fracture Electronically Signed   By: Franchot Gallo M.D.   On: 02/15/2016 14:43     Assessment/Plan  Fall: Mechanical Exam and history not suggestive of neurologic or cardiovascular events. PT consult in am, possibly need rehab/home PT  Right pubic ramus fx:  Due to fall Pain management  Ortho consulted by ED PT consulted   Atrial fibrillation/flutter: Likely related to underlying hypertensive heart disease  Permanent  ChADSVasc:  >2.  Anticoagulation: coumadin  Rate/Rythm control: rate controlled with Dilitazem    CHF: continue lasix and ACEI  CVD: cont statin.                                                             Input & Output: NA Lines & Tubes: PIV DVT prophylaxis: on coumadin  GI prophylaxis: NA Consultants: Ortho / PT Code Status: Full  Family Communication: at bedside   Disposition Plan: Tele bed / Obs    Gennaro Africa  M.D [facility]

## 2016-02-16 DIAGNOSIS — S32501D Unspecified fracture of right pubis, subsequent encounter for fracture with routine healing: Secondary | ICD-10-CM | POA: Diagnosis not present

## 2016-02-16 DIAGNOSIS — S32511A Fracture of superior rim of right pubis, initial encounter for closed fracture: Secondary | ICD-10-CM | POA: Diagnosis not present

## 2016-02-16 DIAGNOSIS — I4891 Unspecified atrial fibrillation: Secondary | ICD-10-CM | POA: Diagnosis not present

## 2016-02-16 DIAGNOSIS — M25551 Pain in right hip: Secondary | ICD-10-CM | POA: Diagnosis not present

## 2016-02-16 NOTE — Consult Note (Signed)
Reason for Consult:  Right-sided pelvic fracture Referring Physician: EDP  Kelli Peterson is an 82 y.o. female.  HPI:   82 yo female with 2 recent mechanical falls.  Reports pain around her right hip and groin area.  After her fall yesterday, had much more significant pain and was admitted thru the ED due to her CT scan showing a minimal right superior pubic rami fracture.  I reviewed the CT scan and agree with this.  Nothing thus far shows a hip fracture, however, she does report considerable pain.  Past Medical History  Diagnosis Date  . Atrial fibrillation ([place])   . Hypertension   . Hyperlipidemia   . Cerebellar hemorrhage ([place])   . Barrett's esophagus 2007  . Adenomatous colon polyp 2007  . Fibrocystic breast disease   . Cervical cancer ([place])   . Iron deficiency anemia     Past Surgical History  Procedure Laterality Date  . Cataract extraction, bilateral  2010  . Total abdominal hysterectomy  1964    Family History  Problem Relation Age of Onset  . CVA Father   . CVA Mother     Social History:  reports that she has never smoked. She has never used smokeless tobacco. She reports that she does not drink alcohol or use illicit drugs.  Allergies:  Allergies  Allergen Reactions  . Penicillins Other (See Comments)  . Zocor [Simvastatin] Other (See Comments)    Weak and dizzy    Medications: I have reviewed the patient's current medications.  Results for orders placed or performed during the hospital encounter of 02/15/16 (from the past 48 hour(s))  CBC with Differential     Status: None   Collection Time: 02/15/16  6:30 PM  Result Value Ref Range   WBC 9.0 4.0 - 10.5 K/uL   RBC 4.50 3.87 - 5.11 MIL/uL   Hemoglobin 13.9 12.0 - 15.0 g/dL   HCT 41.3 36.0 - 46.0 %   MCV 91.8 78.0 - 100.0 fL   MCH 30.9 26.0 - 34.0 pg   MCHC 33.7 30.0 - 36.0 g/dL   RDW 12.9 11.5 - 15.5 %   Platelets 193 150 - 400 K/uL   Neutrophils Relative % 76 %   Neutro Abs 6.8 1.7 - 7.7 K/uL    Lymphocytes Relative 13 %   Lymphs Abs 1.1 0.7 - 4.0 K/uL   Monocytes Relative 11 %   Monocytes Absolute 1.0 0.1 - 1.0 K/uL   Eosinophils Relative 0 %   Eosinophils Absolute 0.0 0.0 - 0.7 K/uL   Basophils Relative 0 %   Basophils Absolute 0.0 0.0 - 0.1 K/uL  Basic metabolic panel     Status: Abnormal   Collection Time: 02/15/16  6:30 PM  Result Value Ref Range   Sodium 140 135 - 145 mmol/L   Potassium 3.8 3.5 - 5.1 mmol/L   Chloride 107 101 - 111 mmol/L   CO2 21 (L) 22 - 32 mmol/L   Glucose, Bld 117 (H) 65 - 99 mg/dL   BUN 12 6 - 20 mg/dL   Creatinine, Ser 0.70 0.44 - 1.00 mg/dL   Calcium 9.4 8.9 - 10.3 mg/dL   GFR calc non Af Amer >60 >60 mL/min   GFR calc Af Amer >60 >60 mL/min    Comment: (NOTE) The eGFR has been calculated using the CKD EPI equation. This calculation has not been validated in all clinical situations. eGFR's persistently <60 mL/min signify possible Chronic Kidney Disease.    Anion  gap 12 5 - 15  Protime-INR     Status: Abnormal   Collection Time: 02/15/16  9:45 PM  Result Value Ref Range   Prothrombin Time 23.7 (H) 11.6 - 15.2 seconds   INR 2.13 (H) 0.00 - 1.49    Ct Hip Right Wo Contrast  02/15/2016  CLINICAL DATA:  Status post fall today and 2 days ago. Pain along the lateral aspect. EXAM: CT OF THE RIGHT HIP WITHOUT CONTRAST TECHNIQUE: Multidetector CT imaging of the right hip was performed according to the standard protocol. Multiplanar CT image reconstructions were also generated. COMPARISON:  None. FINDINGS: There is a nondisplaced mildly comminuted fracture of the right superior pubic ramus. There is no other fracture or dislocation. There is no aggressive osseous lesion. There is no lytic or sclerotic osseous lesion. There is severe right facet arthropathy at L4-5 and L5-S1. The loss poles are normal. There is no muscle atrophy. There is peripheral vascular atherosclerotic disease. There is abdominal aortic atherosclerosis. There is no right inguinal  hernia. There is no inguinal lymphadenopathy. IMPRESSION: 1. Nondisplaced mildly comminuted fracture of the right superior pubic ramus. Electronically Signed   By: Kathreen Devoid   On: 02/15/2016 16:32   Dg Hip Unilat  With Pelvis 2-3 Views Right  02/15/2016  CLINICAL DATA:  Fall.  Right hip pain EXAM: DG HIP (WITH OR WITHOUT PELVIS) 2-3V RIGHT COMPARISON:  None. FINDINGS: Hip joint is normal.  No fracture the femur. Negative for pelvic fracture.  Arterial calcification. IMPRESSION: Negative for fracture Electronically Signed   By: Franchot Gallo M.D.   On: 02/15/2016 14:43    ROS Blood pressure 152/78, pulse 98, temperature 98.3 F (36.8 C), temperature source Oral, resp. rate 16, SpO2 95 %. Physical Exam  Constitutional: She is oriented to person, place, and time. She appears well-developed and well-nourished.  HENT:  Head: Normocephalic and atraumatic.  Eyes: EOM are normal. Pupils are equal, round, and reactive to light.  Neck: Normal range of motion. Neck supple.  Cardiovascular: Normal rate.   Respiratory: Effort normal and breath sounds normal.  GI: Soft. Bowel sounds are normal.  Musculoskeletal:       Right hip: She exhibits bony tenderness.  Neurological: She is alert and oriented to person, place, and time.  Skin: Skin is warm and dry.  Psychiatric: She has a normal mood and affect.   She will let me put her right hip thru gentle rotation and exhibits some groin pain.  Assessment/Plan: Right-sided superior pubic rami fracture 1)  I'm certainly concerned about her recent falls, so I am recommending physical therapy and only up with a walker.  Can attempt full weight-bearing as tolerated.  , Y 02/16/2016, 7:33 AM

## 2016-02-16 NOTE — Care Management Obs Status (Signed)
[facility] NOTIFICATION   Patient Details  Name: Kelli Peterson MRN: [id number] Date of Birth: 1930-04-25   Medicare Observation Status Notification Given:  Yes    Ninfa Meeker, RN 02/16/2016, 11:58 AM

## 2016-02-16 NOTE — Progress Notes (Signed)
PT Cancellation Note  Patient Details Name: Kelli Peterson MRN: [id number] DOB: March 12, 1930   Cancelled Treatment:    Reason Eval/Treat Not Completed: Medical issues which prohibited therapy (await ortho consult and weight bearing status)   Lanetta Inch [facility] 02/16/2016, 7:17 AM Elwyn Reach, [place]

## 2016-02-16 NOTE — Discharge Instructions (Addendum)

## 2016-02-16 NOTE — Progress Notes (Addendum)
Patient ID: Kelli Peterson, female   DOB: Dec 26, 1929, 82 y.o.   MRN: [id number]    PROGRESS NOTE    Kelli Peterson  [id number] DOB: March 26, 1930 DOA: 02/15/2016  PCP: Jani Gravel, MD   Brief Narrative:  82 yo female with history of afib, HTN, who came with cc of fall at Sunday school in [place]. She said while she was trying to reach out to chair she fell landing on her right hip. She denied any prior symptoms of chest pain, dyspnea, palpitations, dizziness, nausea or weakness/sensory deficits. She had a fall last Thursday and 1-2 falls since January. She ambulate well usually without assistance or devices. But she said she has had difficulty with balance since her stroke 20 years ago.   Assessment & Plan:   Active Problems:   Fracture of pubic ramus, right (HCC) - in the setting of mechanical fall  - PT eval done, SNF recommended - per ortho, no indication for surgical intervention - continue to provide analgesia   Atrial fibrillation/flutter, ChADSVasc: > 2 - continue Coumadin  - rate controlled with Dilitazem   CHF, chronic diastolic  - continue lasix and ACEI  CVD - cont statin.   DVT prophylaxis: On Coumadin  Code Status: Full  Family Communication: Patient and daughter at bedside  Disposition Plan: SNF in 1-2 days   Consultants:   Ortho   Procedures:   None  Antimicrobials:   None   Subjective: Reports difficulty with weight bearing.   Objective: Filed Vitals:   02/16/16 0529 02/16/16 0850 02/16/16 1035 02/16/16 1300  BP: 152/78  153/91 127/71  Pulse: 98 53 87 90  Temp: 98.3 F (36.8 C)   98.6 F (37 C)  TempSrc: Oral   Oral  Resp: 16   16  SpO2: 95%      No intake or output data in the 24 hours ending 02/16/16 1501 There were no vitals filed for this visit.  Examination:  General exam: Appears calm and comfortable  Respiratory system: Clear to auscultation. Respiratory effort  normal. Cardiovascular system: S1 & S2 heard, RRR. No JVD, murmurs, rubs, gallops or clicks. No pedal edema. Gastrointestinal system: Abdomen is nondistended, soft and nontender. No organomegaly or masses felt. Normal bowel sounds heard. Central nervous system: Alert and oriented. No focal neurological deficits. Extremities: Symmetric 5 x 5 power. Skin: No rashes, lesions or ulcers Psychiatry: Judgement and insight appear normal. Mood & affect appropriate.    Data Reviewed: I have personally reviewed following labs and imaging studies  CBC:  Recent Labs Lab 02/15/16 1830  WBC 9.0  NEUTROABS 6.8  HGB 13.9  HCT 41.3  MCV 91.8  PLT 0000000   Basic Metabolic Panel:  Recent Labs Lab 02/15/16 1830  NA 140  K 3.8  CL 107  CO2 21*  GLUCOSE 117*  BUN 12  CREATININE 0.70  CALCIUM 9.4   Coagulation Profile:  Recent Labs Lab 02/15/16 2145  INR 2.13*   Radiology Studies: Ct Hip Right Wo Contrast  02/15/2016  CLINICAL DATA:  Status post fall today and 2 days ago. Pain along the lateral aspect. EXAM: CT OF THE RIGHT HIP WITHOUT CONTRAST TECHNIQUE: Multidetector CT imaging of the right hip was performed according to the standard protocol. Multiplanar CT image reconstructions were also generated. COMPARISON:  None. FINDINGS: There is a nondisplaced mildly comminuted fracture of the right superior pubic ramus. There is no other fracture or dislocation. There is no aggressive osseous lesion. There is no lytic or  sclerotic osseous lesion. There is severe right facet arthropathy at L4-5 and L5-S1. The loss poles are normal. There is no muscle atrophy. There is peripheral vascular atherosclerotic disease. There is abdominal aortic atherosclerosis. There is no right inguinal hernia. There is no inguinal lymphadenopathy. IMPRESSION: 1. Nondisplaced mildly comminuted fracture of the right superior pubic ramus. Electronically Signed   By: Kathreen Devoid   On: 02/15/2016 16:32   Dg Hip Unilat  With  Pelvis 2-3 Views Right  02/15/2016  CLINICAL DATA:  Fall.  Right hip pain EXAM: DG HIP (WITH OR WITHOUT PELVIS) 2-3V RIGHT COMPARISON:  None. FINDINGS: Hip joint is normal.  No fracture the femur. Negative for pelvic fracture.  Arterial calcification. IMPRESSION: Negative for fracture Electronically Signed   By: Franchot Gallo M.D.   On: 02/15/2016 14:43      Scheduled Meds: . amLODipine  5 mg Oral Daily  . atorvastatin  20 mg Oral Daily  . digoxin  0.125 mg Oral Daily  . diltiazem  300 mg Oral Daily  . dorzolamide  1 drop Both Eyes BID  . furosemide  20 mg Oral Daily  . latanoprost  1 drop Both Eyes QHS  . lisinopril  30 mg Oral Daily  . multivitamin with minerals  1 tablet Oral Daily  . warfarin  2 mg Oral Q M,W,F,Su-1800   And  . [START ON 02/17/2016] warfarin  3 mg Oral Q T,Th,Sat-1800  . Warfarin - Physician Dosing Inpatient   Does not apply q1800   Continuous Infusions:    LOS: 1 day   Time spent: 20 minutes   Faye Ramsay, MD [facility] Pager [phone]  If 7PM-7AM, please contact night-coverage [email or web address] Password [email or web address] 02/16/2016, 3:01 PM

## 2016-02-16 NOTE — NC FL2 (Signed)
[place] LEVEL OF CARE SCREENING TOOL     IDENTIFICATION  Patient Name: Kelli Peterson Birthdate: Oct 28, 1929 Sex: female Admission Date (Current Location): 02/15/2016  [facility] and [place] Number:  Herbalist and Address:  [facility], [place] [address], [place], [place] [address]      Provider Number: [id number]  Attending Physician Name and Address:  Theodis Blaze, MD  Relative Name and Phone Number:       Current Level of Care: Hospital Recommended Level of Care: [place] Prior Approval Number:    Date Approved/Denied:   PASRR Number: [id number]  Discharge Plan: SNF    Current Diagnoses: Patient Active Problem List   Diagnosis Date Noted  . Pelvic fracture, closed, initial encounter 02/15/2016  . Fracture of pubic ramus ([place]) 02/15/2016    Orientation RESPIRATION BLADDER Height & Weight     Self, Time, Situation, Place  Normal Continent Weight:   Height:     BEHAVIORAL SYMPTOMS/MOOD NEUROLOGICAL BOWEL NUTRITION STATUS      Continent Diet (Please see discharge summary.)  AMBULATORY STATUS COMMUNICATION OF NEEDS Skin   Limited Assist Verbally Normal                       Personal Care Assistance Level of Assistance  Bathing, Feeding, Dressing Bathing Assistance: Maximum assistance Feeding assistance: Limited assistance Dressing Assistance: Maximum assistance     Functional Limitations Info             SPECIAL CARE FACTORS FREQUENCY  PT (By licensed PT), OT (By licensed OT)     PT Frequency: 5 OT Frequency: 5            Contractures      Additional Factors Info  Code Status, Allergies Code Status Info: FULL Allergies Info: Penicillins, Zocor           Current Medications (02/16/2016):  This is the current hospital active medication list Current Facility-Administered Medications  Medication Dose Route Frequency Provider Last Rate Last Dose  . acetaminophen (TYLENOL)  tablet 500 mg  500 mg Oral Q6H PRN Gennaro Africa, MD   500 mg at 02/16/16 0850  . amLODipine (NORVASC) tablet 5 mg  5 mg Oral Daily Rozann Lesches, RPH   5 mg at 02/16/16 0850  . atorvastatin (LIPITOR) tablet 20 mg  20 mg Oral Daily Rozann Lesches, RPH   20 mg at 02/16/16 0850  . digoxin (LANOXIN) tablet 0.125 mg  0.125 mg Oral Daily Gennaro Africa, MD   0.125 mg at 02/15/16 2200  . diltiazem (CARDIZEM CD) 24 hr capsule 300 mg  300 mg Oral Daily Gennaro Africa, MD   300 mg at 02/16/16 1035  . dorzolamide (TRUSOPT) 2 % ophthalmic solution 1 drop  1 drop Both Eyes BID Gennaro Africa, MD   1 drop at 02/16/16 1035  . furosemide (LASIX) tablet 20 mg  20 mg Oral Daily Gennaro Africa, MD   20 mg at 02/16/16 0850  . latanoprost (XALATAN) 0.005 % ophthalmic solution 1 drop  1 drop Both Eyes QHS Gennaro Africa, MD   1 drop at 02/15/16 2237  . lisinopril (PRINIVIL,ZESTRIL) tablet 30 mg  30 mg Oral Daily Gennaro Africa, MD   30 mg at 02/16/16 1000  . morphine 2 MG/ML injection 2 mg  2 mg Intravenous Q4H PRN Gennaro Africa, MD      . multivitamin with minerals tablet 1 tablet  1  tablet Oral Daily Gennaro Africa, MD   1 tablet at 02/16/16 [phone]  . warfarin (COUMADIN) tablet 2 mg  2 mg Oral Q M,W,F,Su-1800 Gennaro Africa, MD       And  . Derrill Memo ON 02/17/2016] warfarin (COUMADIN) tablet 3 mg  3 mg Oral Q T,Th,Sat-1800 Gennaro Africa, MD      . Warfarin - Physician Dosing Inpatient   Does not apply [id number] Gennaro Africa, MD         Discharge Medications: Please see discharge summary for a list of discharge medications.  Relevant Imaging Results:  Relevant Lab Results:   Additional Information SSN: [id number]  Caroline Sauger, LCSW

## 2016-02-16 NOTE — Evaluation (Addendum)
Physical Therapy Evaluation Patient Details Name: Kelli Peterson MRN: [id number] DOB: 01-30-1930 Today's Date: 02/16/2016   History of Present Illness  82 yo admitted after fall at church with prior fall 3 days before as well. Rt pubic rami fx. PMHx: Afib, HTN, CVA, cervical CA, anemia  Clinical Impression  Kelli Peterson is very pleasant and willing to mobilize but limited by pain in right pelvis. Pt with decreased strength, ROM, transfers, gait and function who will benefit from acute therapy to maximize mobility, function, gait, and independence to decrease burden of care. Pt and dgtr educated for HEP, encouraged transfers throughout the day with nursing as well as increased mobility.     Follow Up Recommendations SNF;Supervision/Assistance - 24 hour    Equipment Recommendations  Rolling walker with 5" wheels;3in1 (PT);Wheelchair (measurements PT);Wheelchair cushion (measurements PT)    Recommendations for Other Services       Precautions / Restrictions Precautions Precautions: Fall Restrictions Weight Bearing Restrictions: Yes RLE Weight Bearing: Weight bearing as tolerated      Mobility  Bed Mobility Overal bed mobility: Needs Assistance Bed Mobility: Supine to Sit     Supine to sit: Min assist     General bed mobility comments: Assist to move RLE to pivot to EOB with rail and increased time  Transfers Overall transfer level: Needs assistance   Transfers: Sit to/from Stand Sit to Stand: Min assist         General transfer comment: assist to rise from bed x 2 trials with cues for safety, hand placement and sequence  Ambulation/Gait Ambulation/Gait assistance: Min guard;+2 safety/equipment Ambulation Distance (Feet): 6 Feet Assistive device: Rolling walker (2 wheeled) Gait Pattern/deviations: Step-to pattern;Decreased stride length   Gait velocity interpretation: Below normal speed for age/gender General Gait Details: cues for sequence, RW use and safety,  chair to follow  Stairs            Wheelchair Mobility    Modified Rankin (Stroke Patients Only)       Balance Overall balance assessment: History of Falls                                           Pertinent Vitals/Pain Pain Assessment: 0-10 Pain Score: 5  Pain Location: right hip Pain Descriptors / Indicators: Aching;Throbbing Pain Intervention(s): Limited activity within patient's tolerance;Repositioned;Patient requesting pain meds-RN notified;Monitored during session    Home Living Family/patient expects to be discharged to:: Private residence Living Arrangements: Children Available Help at Discharge: Family;Available PRN/intermittently Type of Home: House Home Access: Level entry     Home Layout: Two level;Bed/bath upstairs Home Equipment: None      Prior Function Level of Independence: Independent         Comments: pt sleeps in recliner, her bedroom is upstairs and she sponge bathes. Lives with dgtr who is not there 24hrs     Hand Dominance        Extremity/Trunk Assessment   Upper Extremity Assessment: Overall WFL for tasks assessed           Lower Extremity Assessment: RLE deficits/detail RLE Deficits / Details: decreased strength and ROM due to pain    Cervical / Trunk Assessment: Kyphotic  Communication   Communication: No difficulties  Cognition Arousal/Alertness: Awake/alert Behavior During Therapy: WFL for tasks assessed/performed Overall Cognitive Status: Within Functional Limits for tasks assessed  General Comments      Exercises        Assessment/Plan    PT Assessment Patient needs continued PT services  PT Diagnosis Difficulty walking;Acute pain;Generalized weakness   PT Problem List Decreased strength;Decreased range of motion;Decreased activity tolerance;Decreased balance;Decreased mobility;Pain;Decreased knowledge of use of DME  PT Treatment Interventions Gait  training;Stair training;Functional mobility training;Therapeutic activities;Therapeutic exercise;Balance training;Patient/family education;DME instruction   PT Goals (Current goals can be found in the Care Plan section) Acute Rehab PT Goals Patient Stated Goal: return home PT Goal Formulation: With patient/family Time For Goal Achievement: 03/01/16 Potential to Achieve Goals: Fair    Frequency Min 3X/week   Barriers to discharge Decreased caregiver support      Co-evaluation               End of Session Equipment Utilized During Treatment: Gait belt Activity Tolerance: Patient tolerated treatment well Patient left: in chair;with call bell/phone within reach;with chair alarm set;with family/visitor present Nurse Communication: Mobility status    Functional Assessment Tool Used: clinical judgement Functional Limitation: Mobility: Walking and moving around Mobility: Walking and Moving Around Current Status [phone]): At least 20 percent but less than 40 percent impaired, limited or restricted Mobility: Walking and Moving Around Goal Status [phone]): At least 1 percent but less than 20 percent impaired, limited or restricted    Time: 0758-0818 PT Time Calculation (min) (ACUTE ONLY): 20 min   Charges:   PT Evaluation $PT Eval Moderate Complexity: 1 Procedure     PT G Codes:   PT G-Codes **NOT FOR INPATIENT CLASS** Functional Assessment Tool Used: clinical judgement Functional Limitation: Mobility: Walking and moving around Mobility: Walking and Moving Around Current Status [id number]): At least 20 percent but less than 40 percent impaired, limited or restricted Mobility: Walking and Moving Around Goal Status [phone]): At least 1 percent but less than 20 percent impaired, limited or restricted    Kelli Peterson 02/16/2016, 8:58 AM Elwyn Reach, [place]

## 2016-02-17 DIAGNOSIS — I4891 Unspecified atrial fibrillation: Secondary | ICD-10-CM | POA: Diagnosis not present

## 2016-02-17 DIAGNOSIS — S32501D Unspecified fracture of right pubis, subsequent encounter for fracture with routine healing: Secondary | ICD-10-CM | POA: Diagnosis not present

## 2016-02-17 LAB — PROTIME-INR
INR: 2.21 — ABNORMAL HIGH (ref 0.00–1.49)
Prothrombin Time: 24.3 seconds — ABNORMAL HIGH (ref 11.6–15.2)

## 2016-02-17 MED ORDER — METOPROLOL TARTRATE 5 MG/5ML IV SOLN
5.0000 mg | INTRAVENOUS | Status: DC | PRN
  Administered 2016-02-17: 5 mg via INTRAVENOUS
  Filled 2016-02-17 (×2): qty 5

## 2016-02-17 NOTE — Clinical Social Work Placement (Signed)
   CLINICAL SOCIAL WORK PLACEMENT  NOTE  Date:  02/17/2016  Patient Details  Name: Kelli Peterson MRN: [id number] Date of Birth: 1930-04-24  Clinical Social Work is seeking post-discharge placement for this patient at the [place] level of care (*CSW will initial, date and re-position this form in  chart as items are completed):  Yes   Patient/family provided with [place] Work Department's list of facilities offering this level of care within the geographic area requested by the patient (or if unable, by the patient's family).  Yes   Patient/family informed of their freedom to choose among providers that offer the needed level of care, that participate in Medicare, Medicaid or managed care program needed by the patient, have an available bed and are willing to accept the patient.  Yes   Patient/family informed of [facility]'s ownership interest in [facility] and [facility], as well as of the fact that they are under no obligation to receive care at these facilities.  PASRR submitted to EDS on 02/17/16     PASRR number received on 02/17/16     Existing PASRR number confirmed on       FL2 transmitted to all facilities in geographic area requested by pt/family on 02/17/16     FL2 transmitted to all facilities within larger geographic area on       Patient informed that his/her managed care company has contracts with or will negotiate with certain facilities, including the following:        Yes   Patient/family informed of bed offers received.  Patient chooses bed at [facility]     Physician recommends and patient chooses bed at      Patient to be transferred to [facility] on  .  Patient to be transferred to facility by PTAR or car     Patient family notified on   of transfer.  Name of family member notified:        PHYSICIAN       Additional Comment:    _______________________________________________ Caroline Sauger,  LCSW 02/17/2016, 1:55 PM

## 2016-02-17 NOTE — Progress Notes (Signed)
Patient ID: Kelli Peterson, female   DOB: 03-03-1930, 82 y.o.   MRN: [id number]    PROGRESS NOTE    Kelli Peterson  [id number] DOB: [birth year] DOA: 02/15/2016  PCP: Jani Gravel, MD   Brief Narrative:  82 yo female with history of afib, HTN, who came with cc of fall at Sunday school in [place]. She said while she was trying to reach out to chair she fell landing on her right hip. She denied any prior symptoms of chest pain, dyspnea, palpitations, dizziness, nausea or weakness/sensory deficits. She had a fall last Thursday and 1-2 falls since January. She ambulate well usually without assistance or devices. But she said she has had difficulty with balance since her stroke 20 years ago.   Assessment & Plan:   Active Problems:   Fracture of pubic ramus, right (HCC) - in the setting of mechanical fall  - PT eval done, SNF recommended - per ortho, no indication for surgical intervention - continue to provide analgesia   Atrial fibrillation/flutter, ChADSVasc: > 2 - continue Coumadin  - rate in 120 - 160's, observe for one more day  - if stable HR, can be d/c in AM  CHF, chronic diastolic  - continue lasix and ACEI  CVD - cont statin.   DVT prophylaxis: On Coumadin  Code Status: Full  Family Communication: Patient and daughter at bedside  Disposition Plan: SNF in AM, [facility]   Consultants:   Ortho   Procedures:   None  Antimicrobials:   None   Subjective: Reports difficulty with weight bearing.   Objective: Filed Vitals:   02/16/16 1300 02/16/16 2007 02/17/16 0450 02/17/16 1344  BP: 127/71 148/86 135/80 126/70  Pulse: 90 72 91 73  Temp: 98.6 F (37 C) 98.7 F (37.1 C) 98.4 F (36.9 C) 98.3 F (36.8 C)  TempSrc: Oral Oral Oral Oral  Resp: 16 17 16 15   SpO2:  95% 95%    No intake or output data in the 24 hours ending 02/17/16 1530 There were no vitals filed for this  visit.  Examination:  General exam: Appears calm and comfortable  Respiratory system: Clear to auscultation. Respiratory effort normal. Cardiovascular system:  IRRR. No JVD, SEM 3/6, no rubs, gallops or clicks. No pedal edema. Gastrointestinal system: Abdomen is nondistended, soft and nontender.  Central nervous system: Alert and oriented. No focal neurological deficits.   Data Reviewed: I have personally reviewed following labs and imaging studies  CBC:  Recent Labs Lab 02/15/16 1830  WBC 9.0  NEUTROABS 6.8  HGB 13.9  HCT 41.3  MCV 91.8  PLT 0000000   Basic Metabolic Panel:  Recent Labs Lab 02/15/16 1830  NA 140  K 3.8  CL 107  CO2 21*  GLUCOSE 117*  BUN 12  CREATININE 0.70  CALCIUM 9.4   Coagulation Profile:  Recent Labs Lab 02/15/16 2145 02/17/16 0440  INR 2.13* 2.21*   Radiology Studies: Ct Hip Right Wo Contrast  02/15/2016  CLINICAL DATA:  Status post fall today and 2 days ago. Pain along the lateral aspect. EXAM: CT OF THE RIGHT HIP WITHOUT CONTRAST TECHNIQUE: Multidetector CT imaging of the right hip was performed according to the standard protocol. Multiplanar CT image reconstructions were also generated. COMPARISON:  None. FINDINGS: There is a nondisplaced mildly comminuted fracture of the right superior pubic ramus. There is no other fracture or dislocation. There is no aggressive osseous lesion. There is no lytic or sclerotic osseous lesion. 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 The loss poles are normal. There is no muscle atrophy. There is peripheral vascular atherosclerotic disease. There is abdominal aortic atherosclerosis. There is no right inguinal hernia. There is no inguinal lymphadenopathy. IMPRESSION: 1. Nondisplaced mildly comminuted fracture of the right superior pubic ramus. Electronically Signed   By: Kathreen Devoid   On: 02/15/2016 16:32      Scheduled Meds: . amLODipine  5 mg Oral Daily  . atorvastatin  20 mg Oral Daily   . digoxin  0.125 mg Oral Daily  . diltiazem  300 mg Oral Daily  . dorzolamide  1 drop Both Eyes BID  . furosemide  20 mg Oral Daily  . latanoprost  1 drop Both Eyes QHS  . lisinopril  30 mg Oral Daily  . multivitamin with minerals  1 tablet Oral Daily  . warfarin  2 mg Oral Q M,W,F,Su-1800   And  . warfarin  3 mg Oral Q T,Th,Sat-1800  . Warfarin - Physician Dosing Inpatient   Does not apply q1800   Continuous Infusions:    LOS: 2 days   Time spent: 20 minutes   Faye Ramsay, MD [facility] Pager [phone]  If 7PM-7AM, please contact night-coverage [email or web address] Password [email or web address] 02/17/2016, 3:30 PM

## 2016-02-17 NOTE — Progress Notes (Signed)
Pt's scheduled dose of Lanoxin and Cardizem. Pt's heart rate has been fluctuating up and down this AM. Pt has worked with PT and has been on the bedpan several times. Will give AM dose of rhythm drugs and will reasess. Pt denies SOB or any other distress. Pt is visably comfortable lying in bed with daughter at bedside.

## 2016-02-17 NOTE — Progress Notes (Signed)
Pt's heart rate has stayed below 110 bpm via cardiac tele following IV toprol. Pt resting comfortably and quietly in bed. Pt's heart rate 60-70 bpm by cardiac tele. Will rept all to oncoming RN. Will continue to monitor.

## 2016-02-17 NOTE — Clinical Social Work Note (Signed)
Clinical Social Work Assessment  Patient Details  Name: Kelli Peterson MRN: [id number] Date of Birth: 10-Oct-1929  Date of referral:  02/17/16               Reason for consult:  Facility Placement, Discharge Planning                Permission sought to share information with:  Facility Sport and exercise psychologist, Family Supports Permission granted to share information::  Yes, Verbal Permission Granted  Name::     [name]  Agency::  [facility] (preference for [employer])  Relationship::  Daughter  Contact Information:     Housing/Transportation Living arrangements for the past 2 months:  [place] of Information:  Patient Patient Interpreter Needed:  None Criminal Activity/Legal Involvement Pertinent to Current Situation/Hospitalization:  No - Comment as needed Significant Relationships:  Adult Children Lives with:  Self Do you feel safe going back to the place where you live?  Yes Need for family participation in patient care:  Yes (Comment) (Patient's daughter active in patient's care.)  Care giving concerns:  Patient expressed no concerns at this time.   Social Worker assessment / plan:  LCSW received referral for possible SNF placement at time of discharge. LCSW spoke with patient and patient's daughter who both stated were agreeable to SNF placement at time of discharge. Patient expressed preference for [facility]. LCSW to continue to follow and assist with discharge planning needs.  Employment status:  Retired Science writer) PT Recommendations:  [place] / Referral to community resources:  [place]  Patient/Family's Response to care:  Patient understanding and agreeable to [employer] of care.  Patient/Family's Understanding of and Emotional Response to Diagnosis, Current Treatment, and Prognosis:  Patient understanding and agreeable to LCSW plan of care.  Emotional  Assessment Appearance:  Appears stated age Attitude/Demeanor/Rapport:  Other (Appropriate) Affect (typically observed):  Accepting, Appropriate, Pleasant Orientation:  Oriented to Self, Oriented to Place, Oriented to  Time, Oriented to Situation Alcohol / Substance use:  Not Applicable Psych involvement (Current and /or in the community):  No (Comment) (Not appropriate on this admission.)  Discharge Needs  Concerns to be addressed:  No discharge needs identified Readmission within the last 30 days:  No Current discharge risk:  None Barriers to Discharge:  No Barriers Identified   Caroline Sauger, LCSW 02/17/2016, 1:53 PM [phone]

## 2016-02-17 NOTE — Progress Notes (Signed)
Pt's heart rate staying more consistently 130-160's even at rest in bed. Rept to Dr. Doyle Askew. Orders received. Pt's BP 152/97 Pulse by dinamap 111 bpm but by cardiac tele 148 bpm, temp 36.9 Celsius, Resp 16 and unlabored, oxy sat 96% RA. Per Dr. Doyle Askew order, pt given slow IV push toprol 5 mg. Will continue to monitor.

## 2016-02-17 NOTE — Progress Notes (Signed)
Pt resting comfortably in bed with daughter at bedside. [place] representative in preparing for discharge. Pt's heart rate by cardiac tele 111. Will continue to monitor.

## 2016-02-18 DIAGNOSIS — I110 Hypertensive heart disease with heart failure: Secondary | ICD-10-CM | POA: Diagnosis not present

## 2016-02-18 DIAGNOSIS — M25551 Pain in right hip: Secondary | ICD-10-CM | POA: Insufficient documentation

## 2016-02-18 DIAGNOSIS — N390 Urinary tract infection, site not specified: Secondary | ICD-10-CM | POA: Diagnosis not present

## 2016-02-18 DIAGNOSIS — S32591A Other specified fracture of right pubis, initial encounter for closed fracture: Secondary | ICD-10-CM | POA: Diagnosis not present

## 2016-02-18 DIAGNOSIS — Z4789 Encounter for other orthopedic aftercare: Secondary | ICD-10-CM | POA: Diagnosis not present

## 2016-02-18 DIAGNOSIS — K2270 Barrett's esophagus without dysplasia: Secondary | ICD-10-CM | POA: Diagnosis not present

## 2016-02-18 DIAGNOSIS — Z880 Allergy status to penicillin: Secondary | ICD-10-CM | POA: Diagnosis not present

## 2016-02-18 DIAGNOSIS — R2681 Unsteadiness on feet: Secondary | ICD-10-CM | POA: Diagnosis not present

## 2016-02-18 DIAGNOSIS — I4892 Unspecified atrial flutter: Secondary | ICD-10-CM | POA: Diagnosis not present

## 2016-02-18 DIAGNOSIS — M6281 Muscle weakness (generalized): Secondary | ICD-10-CM | POA: Diagnosis not present

## 2016-02-18 DIAGNOSIS — I4891 Unspecified atrial fibrillation: Secondary | ICD-10-CM | POA: Diagnosis not present

## 2016-02-18 DIAGNOSIS — I700 Atherosclerosis of aorta: Secondary | ICD-10-CM | POA: Diagnosis not present

## 2016-02-18 DIAGNOSIS — Z79899 Other long term (current) drug therapy: Secondary | ICD-10-CM | POA: Diagnosis not present

## 2016-02-18 DIAGNOSIS — S32501D Unspecified fracture of right pubis, subsequent encounter for fracture with routine healing: Secondary | ICD-10-CM | POA: Diagnosis not present

## 2016-02-18 DIAGNOSIS — S329XXA Fracture of unspecified parts of lumbosacral spine and pelvis, initial encounter for closed fracture: Secondary | ICD-10-CM | POA: Diagnosis not present

## 2016-02-18 DIAGNOSIS — E785 Hyperlipidemia, unspecified: Secondary | ICD-10-CM | POA: Diagnosis not present

## 2016-02-18 DIAGNOSIS — I10 Essential (primary) hypertension: Secondary | ICD-10-CM | POA: Diagnosis not present

## 2016-02-18 DIAGNOSIS — H409 Unspecified glaucoma: Secondary | ICD-10-CM | POA: Diagnosis not present

## 2016-02-18 DIAGNOSIS — Z888 Allergy status to other drugs, medicaments and biological substances status: Secondary | ICD-10-CM | POA: Diagnosis not present

## 2016-02-18 DIAGNOSIS — I5032 Chronic diastolic (congestive) heart failure: Secondary | ICD-10-CM | POA: Diagnosis not present

## 2016-02-18 DIAGNOSIS — S32501S Unspecified fracture of right pubis, sequela: Secondary | ICD-10-CM | POA: Diagnosis not present

## 2016-02-18 DIAGNOSIS — D649 Anemia, unspecified: Secondary | ICD-10-CM | POA: Diagnosis not present

## 2016-02-18 DIAGNOSIS — Z7901 Long term (current) use of anticoagulants: Secondary | ICD-10-CM | POA: Diagnosis not present

## 2016-02-18 DIAGNOSIS — I509 Heart failure, unspecified: Secondary | ICD-10-CM | POA: Diagnosis not present

## 2016-02-18 LAB — BASIC METABOLIC PANEL
Anion gap: 12 (ref 5–15)
BUN: 16 mg/dL (ref 6–20)
CO2: 27 mmol/L (ref 22–32)
Calcium: 9.1 mg/dL (ref 8.9–10.3)
Chloride: 103 mmol/L (ref 101–111)
Creatinine, Ser: 0.7 mg/dL (ref 0.44–1.00)
GFR calc Af Amer: >60 mL/min (ref >60)
GFR calc non Af Amer: >60 mL/min (ref >60)
Glucose, Bld: 105 mg/dL — ABNORMAL HIGH (ref 65–99)
Potassium: 3.9 mmol/L (ref 3.5–5.1)
Sodium: 142 mmol/L (ref 135–145)

## 2016-02-18 LAB — CBC
HCT: 40.8 % (ref 36.0–46.0)
Hemoglobin: 13.6 g/dL (ref 12.0–15.0)
MCH: 30.6 pg (ref 26.0–34.0)
MCHC: 33.3 g/dL (ref 30.0–36.0)
MCV: 91.7 fL (ref 78.0–100.0)
Platelets: 205 10*3/uL (ref 150–400)
RBC: 4.45 MIL/uL (ref 3.87–5.11)
RDW: 12.8 % (ref 11.5–15.5)
WBC: 7.9 10*3/uL (ref 4.0–10.5)

## 2016-02-18 LAB — PROTIME-INR
INR: 1.93 — ABNORMAL HIGH (ref 0.00–1.49)
Prothrombin Time: 21.9 seconds — ABNORMAL HIGH (ref 11.6–15.2)

## 2016-02-18 NOTE — Progress Notes (Signed)
Physical Therapy Treatment Patient Details Name: Kelli Peterson MRN: [id number] DOB: 1930-07-17 Today's Date: 02/18/2016    History of Present Illness 82 yo admitted after fall at church with prior fall 3 days before as well. Rt pubic rami fx. PMHx: Afib, HTN, CVA, cervical CA, anemia    PT Comments    Pt performed increased mobility and denied pain.  Pt anxious to get to rehab to improve function and return home.    Follow Up Recommendations  SNF;Supervision/Assistance - 24 hour     Equipment Recommendations  Rolling walker with 5" wheels;3in1 (PT);Wheelchair (measurements PT);Wheelchair cushion (measurements PT)    Recommendations for Other Services       Precautions / Restrictions Precautions Precautions: Fall Restrictions Weight Bearing Restrictions: Yes RLE Weight Bearing: Weight bearing as tolerated    Mobility  Bed Mobility Overal bed mobility: Needs Assistance Bed Mobility: Supine to Sit     Supine to sit: Supervision     General bed mobility comments: Required cues to advance to edge of bed, no physical assistance required.    Transfers Overall transfer level: Needs assistance Equipment used: Rolling walker (2 wheeled) Transfers: Sit to/from Stand Sit to Stand: Min assist         General transfer comment: Cues to push from seated surface.  Cues for hand placement to reach back for seated surface and use grab bar on left during toileting.    Ambulation/Gait Ambulation/Gait assistance: Min assist Ambulation Distance (Feet): 24 Feet (+10 ft.  ) Assistive device: Rolling walker (2 wheeled) Gait Pattern/deviations: Step-to pattern;Decreased stride length;Trunk flexed;Shuffle     General Gait Details: cues for sequence, RW use and safety.  Pt required assist to turn and sit to maintain RW position.    Stairs            Wheelchair Mobility    Modified Rankin (Stroke Patients Only)       Balance                                     Cognition Arousal/Alertness: Awake/alert Behavior During Therapy: WFL for tasks assessed/performed Overall Cognitive Status: Within Functional Limits for tasks assessed                      Exercises Total Joint Exercises Quad Sets: AROM;Both;10 reps;Supine Gluteal Sets: AROM;Both;10 reps;Supine Heel Slides: AROM;Both;10 reps;Supine    General Comments        Pertinent Vitals/Pain Pain Assessment: No/denies pain    Home Living                      Prior Function            PT Goals (current goals can now be found in the care plan section) Acute Rehab PT Goals Patient Stated Goal: return home Potential to Achieve Goals: Fair Progress towards PT goals: Progressing toward goals    Frequency  Min 3X/week    PT Plan      Co-evaluation             End of Session Equipment Utilized During Treatment: Gait belt Activity Tolerance: Patient tolerated treatment well Patient left: in chair;with call bell/phone within reach;with family/visitor present (Pt eating lunch in chair post tx.  )     [id number] PT Time Calculation (min) (ACUTE ONLY): 17 min  Charges:  $Gait Training: 8-22 mins  G Codes:      Cristela Blue 02/18/2016, 1:18 PM  [place], PTA pager [phone]

## 2016-02-18 NOTE — Progress Notes (Signed)
Attempted to call report to [facility]. Left a message on the supervisor line regarding patient arrival. Left mobile number for return call for report. Florian Buff, RN

## 2016-02-18 NOTE — Clinical Social Work Note (Signed)
Patient to be discharged to [facility]. Patient to be transported via [place]. RN report number: [place], [place] Orthopedics: [phone] Surgical: [phone]

## 2016-02-18 NOTE — Discharge Summary (Signed)
Physician Discharge Summary  Kelli Peterson [id number] DOB: 29-Sep-1930 DOA: 02/15/2016  PCP: Jani Gravel, MD  Admit date: 02/15/2016 Discharge date: 02/18/2016  Recommendations for Outpatient Follow-up:  1. Pt will need to follow up with PCP in 1-2 weeks post discharge 2. Please obtain BMP to evaluate electrolytes and kidney function  Discharge Diagnoses:  Active Problems:   Pelvic fracture, closed, initial encounter   Fracture of pubic ramus [facility])  Discharge Condition: Stable  Diet recommendation: Heart healthy diet discussed in details   Brief Narrative:  82 yo female with history of afib, HTN, who came with cc of fall at Sunday school in [place]. She said while she was trying to reach out to chair she fell landing on her right hip. She denied any prior symptoms of chest pain, dyspnea, palpitations, dizziness, nausea or weakness/sensory deficits. She had a fall last Thursday and 1-2 falls since January. She ambulate well usually without assistance or devices. But she said she has had difficulty with balance since her stroke 20 years ago.   Assessment & Plan:  Active Problems:  Fracture of pubic ramus, right (HCC) - in the setting of mechanical fall  - PT eval done, SNF recommended - per ortho, no indication for surgical intervention  Atrial fibrillation/flutter, ChADSVasc: > 2 - continue Coumadin  - rate better controlled   CHF, chronic diastolic  - continue lasix and ACEI  CVD - cont statin.   DVT prophylaxis: On Coumadin  Code Status: Full  Family Communication: Patient and daughter at bedside  Disposition Plan: SNF   Consultants:   Ortho  Procedures:   None  Antimicrobials:   None  Discharge Exam: Filed Vitals:   02/17/16 2045 02/18/16 0503  BP: 131/64 139/72  Pulse: 79 53  Temp: 98.4 F (36.9 C) 98 F (36.7 C)  Resp: 16 15   Filed Vitals:   02/17/16 0450 02/17/16 1344 02/17/16  2045 02/18/16 0503  BP: 135/80 126/70 131/64 139/72  Pulse: 91 73 79 53  Temp: 98.4 F (36.9 C) 98.3 F (36.8 C) 98.4 F (36.9 C) 98 F (36.7 C)  TempSrc: Oral Oral Oral Oral  Resp: 16 15 16 15   SpO2: 95%  94% 96%    General: Pt is alert, follows commands appropriately, not in acute distress Cardiovascular: Irregular rate and rhythm, no rubs, no gallops Respiratory: Clear to auscultation bilaterally, no wheezing, no crackles, no rhonchi Abdominal: Soft, non tender, non distended, bowel sounds +, no guarding   Discharge Instructions  Discharge Instructions    Diet - low sodium heart healthy    Complete by:  As directed      Increase activity slowly    Complete by:  As directed             Medication List    TAKE these medications        acetaminophen 500 MG tablet  Commonly known as:  TYLENOL  Take 500 mg by mouth every 6 (six) hours as needed for pain.     alendronate 70 MG tablet  Commonly known as:  FOSAMAX  Take 70 mg by mouth every Wednesday. Take with a full glass of water on an empty stomach.     amLODipine 5 MG tablet  Commonly known as:  NORVASC  Take 5 mg by mouth daily.     atorvastatin 20 MG tablet  Commonly known as:  LIPITOR  Take 20 mg by mouth daily.     CALCIUM 600 600 MG Tabs tablet  Generic drug:  calcium carbonate  Take 600 mg by mouth daily with breakfast.     digoxin 0.25 MG tablet  Commonly known as:  LANOXIN  Take 0.125 mg by mouth daily.     diltiazem 300 MG 24 hr capsule  Commonly known as:  CARDIZEM CD  Take 300 mg by mouth daily.     dorzolamide 2 % ophthalmic solution  Commonly known as:  TRUSOPT  Place 1 drop into both eyes 2 (two) times daily.     FISH OIL PO  Take 1 capsule by mouth daily.     furosemide 20 MG tablet  Commonly known as:  LASIX  Take 20 mg by mouth daily.     latanoprost 0.005 % ophthalmic solution  Commonly known as:  XALATAN  Place 1 drop into both eyes at bedtime.     lisinopril 20 MG tablet   Commonly known as:  PRINIVIL,ZESTRIL  Take 30 mg by mouth daily.     multivitamin tablet  Take 1 tablet by mouth daily.     multivitamin with minerals Tabs tablet  Take 1 tablet by mouth daily.     SLOW RELEASE IRON PO  Take 1 tablet by mouth daily.     warfarin 2 MG tablet  Commonly known as:  COUMADIN  Take 2-3 mg by mouth every morning. Takes 1.5 tabs on tues, thurs and sat Takes 1 tab all other days           Follow-up Information    Follow up with Jani Gravel, MD.   Specialty:  Internal Medicine   Contact information:   [place] [address] [phone]       Call Faye Ramsay, MD.   Specialty:  Internal Medicine   Why:  As needed call my cell phone [phone]   Contact information:   [address] [place] [address] [phone]        The results of significant diagnostics from this hospitalization (including imaging, microbiology, ancillary and laboratory) are listed below for reference.     Microbiology: No results found for this or any previous visit (from the past 240 hour(s)).   Labs: Basic Metabolic Panel:  Recent Labs Lab 02/15/16 1830 02/18/16 0533  NA 140 142  K 3.8 3.9  CL 107 103  CO2 21* 27  GLUCOSE 117* 105*  BUN 12 16  CREATININE 0.70 0.70  CALCIUM 9.4 9.1   CBC:  Recent Labs Lab 02/15/16 1830 02/18/16 0533  WBC 9.0 7.9  NEUTROABS 6.8  --   HGB 13.9 13.6  HCT 41.3 40.8  MCV 91.8 91.7  PLT 193 205   SIGNED: Time coordinating discharge: 30 minutes  Faye Ramsay, MD  [facility] 02/18/2016, 8:18 AM Pager [phone]  If 7PM-7AM, please contact night-coverage [email or web address] Password [email or web address]

## 2016-02-18 NOTE — Clinical Social Work Placement (Signed)
   CLINICAL SOCIAL WORK PLACEMENT  NOTE  Date:  02/18/2016  Patient Details  Name: Kelli Peterson MRN: [id number] Date of Birth: [birth year]  Clinical Social Work is seeking post-discharge placement for this patient at the [place] level of care (*CSW will initial, date and re-position this form in  chart as items are completed):  Yes   Patient/family provided with [place] Work Department's list of facilities offering this level of care within the geographic area requested by the patient (or if unable, by the patient's family).  Yes   Patient/family informed of their freedom to choose among providers that offer the needed level of care, that participate in Medicare, Medicaid or managed care program needed by the patient, have an available bed and are willing to accept the patient.  Yes   Patient/family informed of [facility]'s ownership interest in [facility] and [facility], as well as of the fact that they are under no obligation to receive care at these facilities.  PASRR submitted to EDS on 02/17/16     PASRR number received on 02/17/16     Existing PASRR number confirmed on       FL2 transmitted to all facilities in geographic area requested by pt/family on 02/17/16     FL2 transmitted to all facilities within larger geographic area on       Patient informed that his/her managed care company has contracts with or will negotiate with certain facilities, including the following:        Yes   Patient/family informed of bed offers received.  Patient chooses bed at [facility]     Physician recommends and patient chooses bed at      Patient to be transferred to [facility] on 02/18/16.  Patient to be transferred to facility by [facility]     Patient family notified on 02/18/16 of transfer.  Name of family member notified:  Patient and patient's daughter     PHYSICIAN       Additional Comment:     _______________________________________________ Caroline Sauger, LCSW 02/18/2016, 12:07 PM

## 2016-02-19 ENCOUNTER — Non-Acute Institutional Stay (SKILLED_NURSING_FACILITY): Payer: Medicare Other | Admitting: Adult Health

## 2016-02-19 ENCOUNTER — Encounter: Payer: Self-pay | Admitting: Adult Health

## 2016-02-19 DIAGNOSIS — D649 Anemia, unspecified: Secondary | ICD-10-CM | POA: Diagnosis not present

## 2016-02-19 DIAGNOSIS — I509 Heart failure, unspecified: Secondary | ICD-10-CM

## 2016-02-19 DIAGNOSIS — I4891 Unspecified atrial fibrillation: Secondary | ICD-10-CM

## 2016-02-19 DIAGNOSIS — M810 Age-related osteoporosis without current pathological fracture: Secondary | ICD-10-CM | POA: Diagnosis not present

## 2016-02-19 DIAGNOSIS — E785 Hyperlipidemia, unspecified: Secondary | ICD-10-CM

## 2016-02-19 DIAGNOSIS — S32501S Unspecified fracture of right pubis, sequela: Secondary | ICD-10-CM

## 2016-02-19 DIAGNOSIS — H409 Unspecified glaucoma: Secondary | ICD-10-CM

## 2016-02-19 DIAGNOSIS — I10 Essential (primary) hypertension: Secondary | ICD-10-CM | POA: Diagnosis not present

## 2016-02-19 DIAGNOSIS — Z7901 Long term (current) use of anticoagulants: Secondary | ICD-10-CM

## 2016-02-19 DIAGNOSIS — S32591S Other specified fracture of right pubis, sequela: Secondary | ICD-10-CM

## 2016-02-19 NOTE — Progress Notes (Addendum)
Patient ID: Kelli Peterson, female   DOB: 10-22-1929, 82 y.o.   MRN: [id number]    DATE:  02/19/2016   MRN:  [id number]  BIRTHDAY: [birth year]  Facility:  Nursing Home Location:  [place] Room Number: [id number]  LEVEL OF CARE:  SNF [phone])  Contact Information    Name Relation Home Work [employer] Daughter [id number]  [phone]       Code Status History    Date Active Date Inactive Code Status Order ID Comments User Context   02/15/2016  6:53 PM 02/18/2016  3:57 PM Full Code [id number]  Gennaro Africa, MD ED       Chief Complaint  Patient presents with  . Hospitalization Follow-up    HISTORY OF PRESENT ILLNESS:  This is an 82 year old female who has been admitted to [facility] on 02/18/16 from [facility]. She has PMH of atrial fibrillation and hypertension. She fell at home and landed on her right hip and sustained a right pubic ramus fracture. No indication for surgical intervention per orthopedic.  She has been admitted for a short-term rehabilitation.  PAST MEDICAL HISTORY:  Past Medical History  Diagnosis Date  . Atrial fibrillation ([place])   . Hypertension   . Hyperlipidemia   . Cerebellar hemorrhage ([place])   . Barrett's esophagus 2007  . Adenomatous colon polyp 2007  . Fibrocystic breast disease   . Cervical cancer ([place])   . Iron deficiency anemia   . Closed pelvic fracture ([place])   . Fracture of pubic ramus ([place])   . CHF (congestive heart failure) ([place])   . CVD (cardiovascular disease)   . DVT (deep venous thrombosis) ([facility])      CURRENT MEDICATIONS: Reviewed  Patient's Medications  New Prescriptions   No medications on file  Previous Medications   ACETAMINOPHEN (TYLENOL) 500 MG TABLET    Take 500 mg by mouth every 6 (six) hours as needed for pain.   ALENDRONATE (FOSAMAX) 70 MG TABLET    Take 70 mg by mouth every Wednesday. Take with a full glass of water on an empty stomach.   AMLODIPINE (NORVASC) 5 MG  TABLET    Take 5 mg by mouth daily.   ATORVASTATIN (LIPITOR) 20 MG TABLET    Take 20 mg by mouth daily.   CALCIUM CARBONATE (CALCIUM 600) 600 MG TABS TABLET    Take 600 mg by mouth daily with breakfast.   DIGOXIN (LANOXIN) 0.25 MG TABLET    Take 0.125 mg by mouth daily.    DILTIAZEM (CARDIZEM CD) 300 MG 24 HR CAPSULE    Take 300 mg by mouth daily.   DORZOLAMIDE (TRUSOPT) 2 % OPHTHALMIC SOLUTION    Place 1 drop into both eyes 2 (two) times daily.    FERROUS SULFATE (SLOW RELEASE IRON PO)    Take 325 mg by mouth daily.    FUROSEMIDE (LASIX) 20 MG TABLET    Take 20 mg by mouth daily.    LATANOPROST (XALATAN) 0.005 % OPHTHALMIC SOLUTION    Place 1 drop into both eyes at bedtime.    LISINOPRIL (PRINIVIL,ZESTRIL) 20 MG TABLET    Take 30 mg by mouth daily.    MULTIPLE VITAMIN (MULTIVITAMIN WITH MINERALS) TABS TABLET    Take 1 tablet by mouth daily.   OMEGA-3 FATTY ACIDS (FISH OIL PO)    Take 1 capsule by mouth daily.   TRAMADOL (ULTRAM) 50 MG TABLET    Take 50-100 mg by  mouth every 6 (six) hours as needed for moderate pain.   WARFARIN (COUMADIN) 2 MG TABLET    Take 2 mg by mouth. On Sunday, Monday, Wednesday, and Friday   WARFARIN (COUMADIN) 3 MG TABLET    Take 3 mg by mouth daily. Tues, Thurs, Saturday  Modified Medications   No medications on file  Discontinued Medications   MULTIPLE VITAMIN (MULTIVITAMIN) TABLET    Take 1 tablet by mouth daily.   WARFARIN (COUMADIN) 2 MG TABLET    Take 2-3 mg by mouth every morning. Takes 1.5 tabs on tues, thurs and sat Takes 1 tab all other days     Allergies  Allergen Reactions  . Penicillins Other (See Comments)  . Zocor [Simvastatin] Other (See Comments)    Weak and dizzy     REVIEW OF SYSTEMS:  GENERAL: no change in appetite, no fatigue, no weight changes, no fever, chills or weakness SKIN: Denies rash, itching, wounds, ulcer sores, or nail abnormality EYES: Denies change in vision, dry eyes, eye pain, itching or discharge EARS: Denies change in  hearing, ringing in ears, or earache NOSE: Denies nasal congestion or epistaxis MOUTH and THROAT: Denies oral discomfort, gingival pain or bleeding, pain from teeth or hoarseness   RESPIRATORY: no cough, SOB, DOE, wheezing, hemoptysis CARDIAC: no chest pain, edema or palpitations GI: no abdominal pain, diarrhea, constipation, heart burn, nausea or vomiting GU: Denies dysuria, frequency, hematuria, incontinence, or discharge PSYCHIATRIC: Denies feeling of depression or anxiety. No report of hallucinations, insomnia, paranoia, or agitation   PHYSICAL EXAMINATION  GENERAL APPEARANCE: Well nourished. In no acute distress. Normal body habitus SKIN:  Skin is warm and dry.  HEAD: Normal in size and contour. No evidence of trauma EYES: Lids open and close normally. No blepharitis, entropion or ectropion. PERRL. Conjunctivae are clear and sclerae are white. Lenses are without opacity EARS: Pinnae are normal. Patient hears normal voice tunes of the examiner MOUTH and THROAT: Lips are without lesions. Oral mucosa is moist and without lesions. Tongue is normal in shape, size, and color and without lesions NECK: supple, trachea midline, no neck masses, no thyroid tenderness, no thyromegaly LYMPHATICS: no LAN in the neck, no supraclavicular LAN RESPIRATORY: breathing is even & unlabored, BS CTAB CARDIAC: Irregularly irregular, no murmur,no extra heart sounds, no edema GI: abdomen soft, normal BS, no masses, no tenderness, no hepatomegaly, no splenomegaly EXTREMITIES:  Able to move X 4 extremities PSYCHIATRIC: Alert and oriented X 3. Affect and behavior are appropriate  LABS/RADIOLOGY: Labs reviewed: Basic Metabolic Panel:  Recent Labs  02/15/16 1830 02/18/16 0533  NA 140 142  K 3.8 3.9  CL 107 103  CO2 21* 27  GLUCOSE 117* 105*  BUN 12 16  CREATININE 0.70 0.70  CALCIUM 9.4 9.1    CBC:  Recent Labs  02/15/16 1830 02/18/16 0533  WBC 9.0 7.9  NEUTROABS 6.8  --   HGB 13.9 13.6  HCT  41.3 40.8  MCV 91.8 91.7  PLT 193 205     Ct Hip Right Wo Contrast  02/15/2016  CLINICAL DATA:  Status post fall today and 2 days ago. Pain along the lateral aspect. EXAM: CT OF THE RIGHT HIP WITHOUT CONTRAST TECHNIQUE: Multidetector CT imaging of the right hip was performed according to the standard protocol. Multiplanar CT image reconstructions were also generated. COMPARISON:  None. FINDINGS: There is a nondisplaced mildly comminuted fracture of the right superior pubic ramus. There is no other fracture or dislocation. There is no aggressive osseous  lesion. There is no lytic or sclerotic osseous lesion. There is severe right facet arthropathy at L4-5 and L5-S1. The loss poles are normal. There is no muscle atrophy. There is peripheral vascular atherosclerotic disease. There is abdominal aortic atherosclerosis. There is no right inguinal hernia. There is no inguinal lymphadenopathy. IMPRESSION: 1. Nondisplaced mildly comminuted fracture of the right superior pubic ramus. Electronically Signed   By: Kathreen Devoid   On: 02/15/2016 16:32   Dg Hip Unilat  With Pelvis 2-3 Views Right  02/15/2016  CLINICAL DATA:  Fall.  Right hip pain EXAM: DG HIP (WITH OR WITHOUT PELVIS) 2-3V RIGHT COMPARISON:  None. FINDINGS: Hip joint is normal.  No fracture the femur. Negative for pelvic fracture.  Arterial calcification. IMPRESSION: Negative for fracture Electronically Signed   By: Franchot Gallo M.D.   On: 02/15/2016 14:43    ASSESSMENT/PLAN:  Fracture of right pubic ramus -  S/P fall @ home;  no indication for surgical intervention per orthopedic; for rehabilitation; continue tramadol 50 mg 1-2 tabs by mouth every 6 hours when necessary and acetaminophen 500 mg 1 tab by mouth every 6 hours when necessary for pain; follow-up with  Hypertension - continue amlodipine 5 mg 1 tab by mouth daily, lisinopril 30 mg 1 tab by mouth daily and diltiazem 24-hour CD 300 mg 1 capsule by mouth daily; check CMP  Atrial  fibrillation - rate controlled; continue digoxin 0.25 mg take 1/2 tab = 0.125 mg 1 tab by mouth daily and diltiazem 24-hour CD 300 mg 1 capsule by mouth daily; heck digoxin levelc  CHF - stable; continue furosemide 20 mg 1 tab by mouth daily and lisinopril 20 mg 1 tab by mouth daily  Anemia - discontinue iron 325 mg daily; check CBC Lab Results  Component Value Date   WBC 7.9 02/18/2016   HGB 13.6 02/18/2016   HCT 40.8 02/18/2016   MCV 91.7 02/18/2016   PLT 205 02/18/2016   Glaucoma - continue dorzolamide eyedrops 1 drop into both eyes twice a day and latanoprost 0.005% 1 drop into both eyes at bedtime  Long-term use of anticoagulant - INR 2.1; continue Coumadin 3 mg 1 tab by mouth every Tuesdays-Thursdays-Saturdays and 2 mg every Mondays-Wednesdays-Fridays-Sundays; check INR on 02/24/16  Hyperlipidemia - continue atorvastatin 20 mg 1 tab by mouth daily; check lipid panel  Osteoporosis -  continue Fosamax 70 mg 1 tab by mouth every Wednesdays and calcium 600 mg 1 tab by mouth daily     Goals of care:  Short-term rehabilitation    Durenda Age, NP [facility] [phone]

## 2016-02-20 LAB — HEPATIC FUNCTION PANEL
ALT: 12 U/L (ref 7–35)
AST: 16 U/L (ref 13–35)
Alkaline Phosphatase: 63 U/L (ref 25–125)
Bilirubin, Total: 0.4 mg/dL

## 2016-02-20 LAB — CBC AND DIFFERENTIAL
HCT: 41 % (ref 36–46)
Hemoglobin: 13.4 g/dL (ref 12.0–16.0)
Neutrophils Absolute: 6 /uL
Platelets: 233 10*3/uL (ref 150–399)
WBC: 8.1 10^3/mL

## 2016-02-20 LAB — LIPID PANEL
Cholesterol: 106 mg/dL (ref 0–200)
HDL: 31 mg/dL — AB (ref 35–70)
LDL Cholesterol: 56 mg/dL
Triglycerides: 92 mg/dL (ref 40–160)

## 2016-02-20 LAB — BASIC METABOLIC PANEL
BUN: 15 mg/dL (ref 4–21)
Creatinine: 0.6 mg/dL (ref 0.5–1.1)
Glucose: 104 mg/dL
Potassium: 4 mmol/L (ref 3.4–5.3)
Sodium: 142 mmol/L (ref 137–147)

## 2016-02-24 ENCOUNTER — Encounter: Payer: Self-pay | Admitting: Internal Medicine

## 2016-02-24 ENCOUNTER — Non-Acute Institutional Stay (SKILLED_NURSING_FACILITY): Payer: Medicare Other | Admitting: Internal Medicine

## 2016-02-24 DIAGNOSIS — M810 Age-related osteoporosis without current pathological fracture: Secondary | ICD-10-CM | POA: Diagnosis not present

## 2016-02-24 DIAGNOSIS — I10 Essential (primary) hypertension: Secondary | ICD-10-CM

## 2016-02-24 DIAGNOSIS — I4891 Unspecified atrial fibrillation: Secondary | ICD-10-CM

## 2016-02-24 DIAGNOSIS — S32591S Other specified fracture of right pubis, sequela: Secondary | ICD-10-CM

## 2016-02-24 DIAGNOSIS — E785 Hyperlipidemia, unspecified: Secondary | ICD-10-CM | POA: Diagnosis not present

## 2016-02-24 DIAGNOSIS — Z7901 Long term (current) use of anticoagulants: Secondary | ICD-10-CM

## 2016-02-24 DIAGNOSIS — S32501S Unspecified fracture of right pubis, sequela: Secondary | ICD-10-CM | POA: Diagnosis not present

## 2016-02-24 DIAGNOSIS — I509 Heart failure, unspecified: Secondary | ICD-10-CM | POA: Diagnosis not present

## 2016-02-24 DIAGNOSIS — R2681 Unsteadiness on feet: Secondary | ICD-10-CM

## 2016-02-24 NOTE — Progress Notes (Signed)
LOCATION: [place]  PCP: Jani Gravel, MD   Code Status: Full Code  Goals of care: Advanced Directive information No flowsheet data found.     Extended Emergency Contact Information Primary Emergency Contact: [name],[name] Address: [place] [address] [name] [place] Phone: [phone] Mobile Phone: [phone] Relation: Daughter   Allergies  Allergen Reactions  . Penicillins Other (See Comments)  . Zocor [Simvastatin] Other (See Comments)    Weak and dizzy   Chief complaint- new admission from hospital   HPI:  Patient is a 82 y.o. female seen today for short term rehabilitation post hospital admission from 02/15/16-02/18/16 with closed pelvic fracture post fall. Orthopedic was consulted and recommended medical management. She has PMH of afib, HTN, old CVA among others. She is seen in her room today.  Review of Systems:  Constitutional: Negative for fever, chills, diaphoresis. Energy level is slowly returning.   HENT: Negative for headache, congestion, nasal discharge Eyes: Negative for blurred vision, double vision and discharge.  Respiratory: Negative for cough, shortness of breath and wheezing.   Cardiovascular: Negative for chest pain, palpitations, leg swelling.  Gastrointestinal: Negative for heartburn, nausea, vomiting, abdominal pain. Last bowel movement was yesterday.  Genitourinary: Negative for dysuria Musculoskeletal: Negative for back pain, fall in the facility.  Skin: Negative for itching, rash.  Neurological: Negative for dizziness. Psychiatric/Behavioral: Negative for depression   Past Medical History  Diagnosis Date  . Atrial fibrillation ([place])   . Hypertension   . Hyperlipidemia   . Cerebellar hemorrhage ([place])   . Barrett's esophagus 2007  . Adenomatous colon polyp 2007  . Fibrocystic breast disease   . Cervical cancer ([place])   . Iron deficiency anemia   . Closed pelvic fracture ([place])   .  Fracture of pubic ramus ([place])   . CHF (congestive heart failure) ([place])   . CVD (cardiovascular disease)   . DVT (deep venous thrombosis) [facility])    Past Surgical History  Procedure Laterality Date  . Cataract extraction, bilateral  2010  . Total abdominal hysterectomy  1964   Social History:   reports that she has never smoked. She has never used smokeless tobacco. She reports that she does not drink alcohol or use illicit drugs.  Family History  Problem Relation Age of Onset  . CVA Father   . CVA Mother     Medications:   Medication List       This list is accurate as of: 02/24/16  4:33 PM.  Always use your most recent med list.               acetaminophen 500 MG tablet  Commonly known as:  TYLENOL  Take 500 mg by mouth every 6 (six) hours as needed for pain.     alendronate 70 MG tablet  Commonly known as:  FOSAMAX  Take 70 mg by mouth every Wednesday. Take with a full glass of water on an empty stomach.     amLODipine 5 MG tablet  Commonly known as:  NORVASC  Take 5 mg by mouth daily.     atorvastatin 20 MG tablet  Commonly known as:  LIPITOR  Take 20 mg by mouth daily.     CALCIUM 600 600 MG Tabs tablet  Generic drug:  calcium carbonate  Take 600 mg by mouth daily with breakfast.     digoxin 0.25 MG tablet  Commonly known as:  LANOXIN  Take 0.125 mg by mouth daily.     diltiazem 300 MG 24 hr capsule  Commonly known as:  CARDIZEM CD  Take 300 mg by mouth daily.     dorzolamide 2 % ophthalmic solution  Commonly known as:  TRUSOPT  Place 1 drop into both eyes 2 (two) times daily.     FISH OIL PO  Take 1 capsule by mouth daily.     furosemide 20 MG tablet  Commonly known as:  LASIX  Take 20 mg by mouth daily.     latanoprost 0.005 % ophthalmic solution  Commonly known as:  XALATAN  Place 1 drop into both eyes at bedtime.     lisinopril 20 MG tablet  Commonly known as:  PRINIVIL,ZESTRIL  Take 30 mg by mouth daily.     multivitamin with minerals  Tabs tablet  Take 1 tablet by mouth daily.     traMADol 50 MG tablet  Commonly known as:  ULTRAM  Take 50-100 mg by mouth every 6 (six) hours as needed for moderate pain.     warfarin 2 MG tablet  Commonly known as:  COUMADIN  Take 2 mg by mouth. On Sunday, Monday, Wednesday, and Friday     warfarin 3 MG tablet  Commonly known as:  COUMADIN  Take 3 mg by mouth daily. Tues, Thurs, Saturday        Immunizations: Immunization History  Administered Date(s) Administered  . PPD Test 02/18/2016     Physical Exam: Filed Vitals:   02/24/16 1628  BP: 146/86  Pulse: 88  Temp: 97.6 F (36.4 C)  TempSrc: Oral  Resp: 18  Height: 5' (1.524 m)  Weight: 124 lb (56.246 kg)  SpO2: 96%   Body mass index is 24.22 kg/(m^2).  General- elderly female, well built, in no acute distress Head- normocephalic, atraumatic Nose- no nasal discharge Throat- moist mucus membrane Eyes- PERRLA, EOMI, no pallor, no icterus Neck- no cervical lymphadenopathy Cardiovascular- normal s1,s2, no murmur, no leg edema Respiratory- bilateral clear to auscultation, no wheeze, no rhonchi, no crackles, no use of accessory muscles Abdomen- bowel sounds present, soft, non tender Musculoskeletal- able to move all 4 extremities, limited range of motion at her pelvis Neurological-  alert and oriented to person, place and time Skin- warm and dry Psychiatry- normal mood and affect    Labs reviewed: Basic Metabolic Panel:  Recent Labs  02/15/16 1830 02/18/16 0533  NA 140 142  K 3.8 3.9  CL 107 103  CO2 21* 27  GLUCOSE 117* 105*  BUN 12 16  CREATININE 0.70 0.70  CALCIUM 9.4 9.1   CBC:  Recent Labs  02/15/16 1830 02/18/16 0533  WBC 9.0 7.9  NEUTROABS 6.8  --   HGB 13.9 13.6  HCT 41.3 40.8  MCV 91.8 91.7  PLT 193 205    Radiological Exams: Ct Hip Right Wo Contrast  02/15/2016  CLINICAL DATA:  Status post fall today and 2 days ago. Pain along the lateral aspect. EXAM: CT OF THE RIGHT HIP  WITHOUT CONTRAST TECHNIQUE: Multidetector CT imaging of the right hip was performed according to the standard protocol. Multiplanar CT image reconstructions were also generated. COMPARISON:  None. FINDINGS: There is a nondisplaced mildly comminuted fracture of the right superior pubic ramus. There is no other fracture or dislocation. There is no aggressive osseous lesion. There is no lytic or sclerotic osseous lesion. There is severe right facet arthropathy at L4-5 and L5-S1. The loss poles are normal. There is no  muscle atrophy. There is peripheral vascular atherosclerotic disease. There is abdominal aortic atherosclerosis. There is no right inguinal hernia. There is no inguinal lymphadenopathy. IMPRESSION: 1. Nondisplaced mildly comminuted fracture of the right superior pubic ramus. Electronically Signed   By: Kathreen Devoid   On: 02/15/2016 16:32   Dg Hip Unilat  With Pelvis 2-3 Views Right  02/15/2016  CLINICAL DATA:  Fall.  Right hip pain EXAM: DG HIP (WITH OR WITHOUT PELVIS) 2-3V RIGHT COMPARISON:  None. FINDINGS: Hip joint is normal.  No fracture the femur. Negative for pelvic fracture.  Arterial calcification. IMPRESSION: Negative for fracture Electronically Signed   By: Franchot Gallo M.D.   On: 02/15/2016 14:43    Assessment/Plan  Unsteady gait With increased falls. Will have her work with physical therapy and occupational therapy team to help with gait training and muscle strengthening exercises.fall precautions. Skin care. Encourage to be out of bed.   Fracture of right pubic ramus Medical management per orthopedics. Will have patient work with PT/OT as tolerated to regain strength and restore function.  Fall precautions are in place. Continue tramadol 50 mg 1-2 tabs q6h prn pain with tylenol 500 mg q6h prn.    afib Rate controlled. Continue diltiazem CD 300 mg daily with digoxin 0.125 mg daily. Check digoxin level. Continue warfarin for anticoagulation  Hypertension Stable. continue  amlodipine 5 mg daily, lisinopril 30 mg daily and check BP and BMP  CHF continue lisinopril and lasix and monitor bmp. Monitor weight.   HLD Continue atorvastatin  Long term anticoagulation With afib, continue coumadin  Osteoporosis  Fall precautions. continue Fosamax 70 mg weekly and calcium supplement     Goals of care: short term rehabilitation   Labs/tests ordered: cbc, cmp, digoxin level  Family/ staff Communication: reviewed care plan with patient and nursing supervisor    Blanchie Serve, MD Internal Medicine [place], [place] [address] Cell Phone (Monday-Friday 8 am - 5 pm): [phone] On Call: [phone] and follow prompts after 5 pm and on weekends Office Phone: [phone] Office Fax: [phone]

## 2016-03-02 ENCOUNTER — Non-Acute Institutional Stay (SKILLED_NURSING_FACILITY): Payer: Medicare Other | Admitting: Adult Health

## 2016-03-02 ENCOUNTER — Encounter: Payer: Self-pay | Admitting: Adult Health

## 2016-03-02 DIAGNOSIS — I4891 Unspecified atrial fibrillation: Secondary | ICD-10-CM

## 2016-03-02 DIAGNOSIS — H409 Unspecified glaucoma: Secondary | ICD-10-CM | POA: Diagnosis not present

## 2016-03-02 DIAGNOSIS — I10 Essential (primary) hypertension: Secondary | ICD-10-CM | POA: Diagnosis not present

## 2016-03-02 DIAGNOSIS — S32591S Other specified fracture of right pubis, sequela: Secondary | ICD-10-CM

## 2016-03-02 DIAGNOSIS — M810 Age-related osteoporosis without current pathological fracture: Secondary | ICD-10-CM

## 2016-03-02 DIAGNOSIS — I509 Heart failure, unspecified: Secondary | ICD-10-CM | POA: Diagnosis not present

## 2016-03-02 DIAGNOSIS — E785 Hyperlipidemia, unspecified: Secondary | ICD-10-CM

## 2016-03-02 DIAGNOSIS — Z7901 Long term (current) use of anticoagulants: Secondary | ICD-10-CM

## 2016-03-02 DIAGNOSIS — S32501S Unspecified fracture of right pubis, sequela: Secondary | ICD-10-CM

## 2016-03-02 NOTE — Progress Notes (Signed)
Patient ID: Kelli Peterson, female   DOB: 08/09/1930, 82 y.o.   MRN: [id number]    DATE:  03/02/2016   MRN:  [id number]  BIRTHDAY: December 25, 1929  Facility:  Nursing Home Location:  [place] Room Number: [id number]  LEVEL OF CARE:  SNF [phone])  Contact Information    Name Relation Home Work [place] Daughter [phone]  [phone]       Code Status History    Date Active Date Inactive Code Status Order ID Comments User Context   02/15/2016  6:53 PM 02/18/2016  3:57 PM Full Code [id number]  Gennaro Africa, MD ED       Chief Complaint  Patient presents with  . Discharge Note    HISTORY OF PRESENT ILLNESS:  This is an 82 year old female who Is for discharge home with home health PT, OT, CNA and Nursing.  DME:  Rolling walker and bedside commode  She has been admitted to [facility] on 02/18/16 from [facility]. She has PMH of atrial fibrillation and hypertension. She fell at home and landed on her right hip and sustained a right pubic ramus fracture. No indication for surgical intervention per orthopedic.  Patient was admitted to this facility for short-term rehabilitation after the patient's recent hospitalization.  Patient has completed SNF rehabilitation and therapy has cleared the patient for discharge.  PAST MEDICAL HISTORY:  Past Medical History  Diagnosis Date  . Atrial fibrillation ([place])   . Hypertension   . Hyperlipidemia   . Cerebellar hemorrhage ([place])   . Barrett's esophagus 2007  . Adenomatous colon polyp 2007  . Fibrocystic breast disease   . Cervical cancer ([place])   . Iron deficiency anemia   . Closed pelvic fracture ([place])   . Fracture of pubic ramus ([place])   . CHF (congestive heart failure) ([place])   . CVD (cardiovascular disease)   . DVT (deep venous thrombosis) ([facility])      CURRENT MEDICATIONS: Reviewed  Patient's Medications  New Prescriptions   No medications on file  Previous Medications    ACETAMINOPHEN (TYLENOL) 500 MG TABLET    Take 500 mg by mouth every 6 (six) hours as needed for pain.   ALENDRONATE (FOSAMAX) 70 MG TABLET    Take 70 mg by mouth every Wednesday. Take with a full glass of water on an empty stomach.   AMLODIPINE (NORVASC) 5 MG TABLET    Take 5 mg by mouth daily.   ATORVASTATIN (LIPITOR) 20 MG TABLET    Take 20 mg by mouth daily.   CALCIUM CARBONATE (CALCIUM 600) 600 MG TABS TABLET    Take 600 mg by mouth daily with breakfast.   DIGOXIN (LANOXIN) 0.25 MG TABLET    Take 0.125 mg by mouth daily.    DILTIAZEM (CARDIZEM CD) 300 MG 24 HR CAPSULE    Take 300 mg by mouth daily.   DORZOLAMIDE (TRUSOPT) 2 % OPHTHALMIC SOLUTION    Place 1 drop into both eyes 2 (two) times daily.    FUROSEMIDE (LASIX) 20 MG TABLET    Take 20 mg by mouth daily.    LATANOPROST (XALATAN) 0.005 % OPHTHALMIC SOLUTION    Place 1 drop into both eyes at bedtime.    LISINOPRIL (PRINIVIL,ZESTRIL) 20 MG TABLET    Take 30 mg by mouth daily.    MULTIPLE VITAMIN (MULTIVITAMIN WITH MINERALS) TABS TABLET    Take 1 tablet by mouth daily.   OMEGA-3 FATTY ACIDS (FISH OIL  PO)    Take 1 capsule by mouth daily.   TRAMADOL (ULTRAM) 50 MG TABLET    Take 50-100 mg by mouth every 6 (six) hours as needed for moderate pain.   WARFARIN (COUMADIN) 2 MG TABLET    Take 2 mg by mouth. On Sunday, Monday, Wednesday, and Friday   WARFARIN (COUMADIN) 3 MG TABLET    Take 3 mg by mouth daily. Tues, Thurs, Saturday  Modified Medications   No medications on file  Discontinued Medications   No medications on file     Allergies  Allergen Reactions  . Penicillins Other (See Comments)  . Zocor [Simvastatin] Other (See Comments)    Weak and dizzy     REVIEW OF SYSTEMS:  GENERAL: no change in appetite, no fatigue, no weight changes, no fever, chills or weakness SKIN: Denies rash, itching, wounds, ulcer sores, or nail abnormality EYES: Denies change in vision, dry eyes, eye pain, itching or discharge EARS: Denies change in  hearing, ringing in ears, or earache NOSE: Denies nasal congestion or epistaxis MOUTH and THROAT: Denies oral discomfort, gingival pain or bleeding, pain from teeth or hoarseness   RESPIRATORY: no cough, SOB, DOE, wheezing, hemoptysis CARDIAC: no chest pain, edema or palpitations GI: no abdominal pain, diarrhea, constipation, heart burn, nausea or vomiting GU: Denies dysuria, frequency, hematuria, incontinence, or discharge PSYCHIATRIC: Denies feeling of depression or anxiety. No report of hallucinations, insomnia, paranoia, or agitation   PHYSICAL EXAMINATION  GENERAL APPEARANCE: Well nourished. In no acute distress. Normal body habitus SKIN:  Skin is warm and dry.  HEAD: Normal in size and contour. No evidence of trauma EYES: Lids open and close normally. No blepharitis, entropion or ectropion. PERRL. Conjunctivae are clear and sclerae are white. Lenses are without opacity EARS: Pinnae are normal. Patient hears normal voice tunes of the examiner MOUTH and THROAT: Lips are without lesions. Oral mucosa is moist and without lesions. Tongue is normal in shape, size, and color and without lesions NECK: supple, trachea midline, no neck masses, no thyroid tenderness, no thyromegaly LYMPHATICS: no LAN in the neck, no supraclavicular LAN RESPIRATORY: breathing is even & unlabored, BS CTAB CARDIAC: Irregularly irregular, no murmur,no extra heart sounds, no edema GI: abdomen soft, normal BS, no masses, no tenderness, no hepatomegaly, no splenomegaly EXTREMITIES:  Able to move X 4 extremities PSYCHIATRIC: Alert and oriented X 3. Affect and behavior are appropriate  LABS/RADIOLOGY: Labs reviewed: Basic Metabolic Panel:  Recent Labs  02/15/16 1830 02/18/16 0533 02/20/16  NA 140 142 142  K 3.8 3.9 4.0  CL 107 103  --   CO2 21* 27  --   GLUCOSE 117* 105*  --   BUN 12 16 15   CREATININE 0.70 0.70 0.6  CALCIUM 9.4 9.1  --     CBC:  Recent Labs  02/15/16 1830 02/18/16 0533 02/20/16   WBC 9.0 7.9 8.1  NEUTROABS 6.8  --  6  HGB 13.9 13.6 13.4  HCT 41.3 40.8 41  MCV 91.8 91.7  --   PLT 193 205 233     Ct Hip Right Wo Contrast  02/15/2016  CLINICAL DATA:  Status post fall today and 2 days ago. Pain along the lateral aspect. EXAM: CT OF THE RIGHT HIP WITHOUT CONTRAST TECHNIQUE: Multidetector CT imaging of the right hip was performed according to the standard protocol. Multiplanar CT image reconstructions were also generated. COMPARISON:  None. FINDINGS: There is a nondisplaced mildly comminuted fracture of the right superior pubic ramus. There is  no other fracture or dislocation. There is no aggressive osseous lesion. There is no lytic or sclerotic osseous lesion. There is severe right facet arthropathy at L4-5 and L5-S1. The loss poles are normal. There is no muscle atrophy. There is peripheral vascular atherosclerotic disease. There is abdominal aortic atherosclerosis. There is no right inguinal hernia. There is no inguinal lymphadenopathy. IMPRESSION: 1. Nondisplaced mildly comminuted fracture of the right superior pubic ramus. Electronically Signed   By: Kathreen Devoid   On: 02/15/2016 16:32   Dg Hip Unilat  With Pelvis 2-3 Views Right  02/15/2016  CLINICAL DATA:  Fall.  Right hip pain EXAM: DG HIP (WITH OR WITHOUT PELVIS) 2-3V RIGHT COMPARISON:  None. FINDINGS: Hip joint is normal.  No fracture the femur. Negative for pelvic fracture.  Arterial calcification. IMPRESSION: Negative for fracture Electronically Signed   By: Franchot Gallo M.D.   On: 02/15/2016 14:43    ASSESSMENT/PLAN:  Fracture of right pubic ramus -  S/P fall @ home;  no indication for surgical intervention per orthopedic; for home health PT, OT, CNA and Nursing; continue tramadol 50 mg 1-2 tabs by mouth every 6 hours when necessary and acetaminophen 500 mg 1 tab by mouth every 6 hours when necessary for pain  Hypertension - well controlled; continue amlodipine 5 mg 1 tab by mouth daily, lisinopril 30 mg 1 tab  by mouth daily and diltiazem 24-hour CD 300 mg 1 capsule by mouth daily  Atrial fibrillation - rate controlled; continue digoxin 0.25 mg take 1/2 tab = 0.125 mg 1 tab by mouth daily and diltiazem 24-hour CD 300 mg 1 capsule by mouth daily; digoxin level 0.8  CHF - stable; continue furosemide 20 mg 1 tab by mouth daily and lisinopril 20 mg 1 tab by mouth daily  Glaucoma - continue dorzolamide eyedrops 1 drop into both eyes twice a day and latanoprost 0.005% 1 drop into both eyes at bedtime  Long-term use of anticoagulant - INR 2.9; continue Coumadin 3 mg 1 tab by mouth every Tuesdays-Thursdays-Saturdays and 2 mg every Mondays-Wednesdays-Fridays-Sundays; check INR on 03/04/16  Hyperlipidemia - continue atorvastatin 20 mg 1 tab by mouth daily Lab Results  Component Value Date   CHOL 106 02/20/2016   HDL 31* 02/20/2016   LDLCALC 56 02/20/2016   TRIG 92 02/20/2016    Osteoporosis -  continue Fosamax 70 mg 1 tab by mouth every Wednesdays and calcium 600 mg 1 tab by mouth daily      I have filled out patient's discharge paperwork and written prescriptions.  Patient will receive home health PT, OT, Nursing and CNA.  DME provided:  Rolling walker and bedside commode  Total discharge time: Greater than 30 minutes  Discharge time involved coordination of the discharge process with Education officer, museum, nursing staff and therapy department. Medical justification for home health services/DME verified.     Durenda Age, NP [employer] [phone]

## 2016-03-05 DIAGNOSIS — Z7901 Long term (current) use of anticoagulants: Secondary | ICD-10-CM | POA: Diagnosis not present

## 2016-03-05 DIAGNOSIS — Z9181 History of falling: Secondary | ICD-10-CM | POA: Diagnosis not present

## 2016-03-05 DIAGNOSIS — I110 Hypertensive heart disease with heart failure: Secondary | ICD-10-CM | POA: Diagnosis not present

## 2016-03-05 DIAGNOSIS — W07XXXD Fall from chair, subsequent encounter: Secondary | ICD-10-CM | POA: Diagnosis not present

## 2016-03-05 DIAGNOSIS — I4892 Unspecified atrial flutter: Secondary | ICD-10-CM | POA: Diagnosis not present

## 2016-03-05 DIAGNOSIS — I2510 Atherosclerotic heart disease of native coronary artery without angina pectoris: Secondary | ICD-10-CM | POA: Diagnosis not present

## 2016-03-05 DIAGNOSIS — I5032 Chronic diastolic (congestive) heart failure: Secondary | ICD-10-CM | POA: Diagnosis not present

## 2016-03-05 DIAGNOSIS — S32501D Unspecified fracture of right pubis, subsequent encounter for fracture with routine healing: Secondary | ICD-10-CM | POA: Diagnosis not present

## 2016-03-05 DIAGNOSIS — I4891 Unspecified atrial fibrillation: Secondary | ICD-10-CM | POA: Diagnosis not present

## 2016-03-08 DIAGNOSIS — R05 Cough: Secondary | ICD-10-CM | POA: Diagnosis not present

## 2016-03-09 DIAGNOSIS — I5032 Chronic diastolic (congestive) heart failure: Secondary | ICD-10-CM | POA: Diagnosis not present

## 2016-03-09 DIAGNOSIS — I4892 Unspecified atrial flutter: Secondary | ICD-10-CM | POA: Diagnosis not present

## 2016-03-09 DIAGNOSIS — W07XXXD Fall from chair, subsequent encounter: Secondary | ICD-10-CM | POA: Diagnosis not present

## 2016-03-09 DIAGNOSIS — S32501D Unspecified fracture of right pubis, subsequent encounter for fracture with routine healing: Secondary | ICD-10-CM | POA: Diagnosis not present

## 2016-03-09 DIAGNOSIS — Z7901 Long term (current) use of anticoagulants: Secondary | ICD-10-CM | POA: Diagnosis not present

## 2016-03-09 DIAGNOSIS — I2510 Atherosclerotic heart disease of native coronary artery without angina pectoris: Secondary | ICD-10-CM | POA: Diagnosis not present

## 2016-03-09 DIAGNOSIS — I4891 Unspecified atrial fibrillation: Secondary | ICD-10-CM | POA: Diagnosis not present

## 2016-03-09 DIAGNOSIS — I110 Hypertensive heart disease with heart failure: Secondary | ICD-10-CM | POA: Diagnosis not present

## 2016-03-09 DIAGNOSIS — Z9181 History of falling: Secondary | ICD-10-CM | POA: Diagnosis not present

## 2016-03-10 DIAGNOSIS — Z9181 History of falling: Secondary | ICD-10-CM | POA: Diagnosis not present

## 2016-03-10 DIAGNOSIS — I4892 Unspecified atrial flutter: Secondary | ICD-10-CM | POA: Diagnosis not present

## 2016-03-10 DIAGNOSIS — I4891 Unspecified atrial fibrillation: Secondary | ICD-10-CM | POA: Diagnosis not present

## 2016-03-10 DIAGNOSIS — W07XXXD Fall from chair, subsequent encounter: Secondary | ICD-10-CM | POA: Diagnosis not present

## 2016-03-10 DIAGNOSIS — I110 Hypertensive heart disease with heart failure: Secondary | ICD-10-CM | POA: Diagnosis not present

## 2016-03-10 DIAGNOSIS — Z7901 Long term (current) use of anticoagulants: Secondary | ICD-10-CM | POA: Diagnosis not present

## 2016-03-10 DIAGNOSIS — S32501D Unspecified fracture of right pubis, subsequent encounter for fracture with routine healing: Secondary | ICD-10-CM | POA: Diagnosis not present

## 2016-03-10 DIAGNOSIS — I5032 Chronic diastolic (congestive) heart failure: Secondary | ICD-10-CM | POA: Diagnosis not present

## 2016-03-10 DIAGNOSIS — I2510 Atherosclerotic heart disease of native coronary artery without angina pectoris: Secondary | ICD-10-CM | POA: Diagnosis not present

## 2016-03-11 DIAGNOSIS — I5032 Chronic diastolic (congestive) heart failure: Secondary | ICD-10-CM | POA: Diagnosis not present

## 2016-03-11 DIAGNOSIS — I2510 Atherosclerotic heart disease of native coronary artery without angina pectoris: Secondary | ICD-10-CM | POA: Diagnosis not present

## 2016-03-11 DIAGNOSIS — I4892 Unspecified atrial flutter: Secondary | ICD-10-CM | POA: Diagnosis not present

## 2016-03-11 DIAGNOSIS — S32501D Unspecified fracture of right pubis, subsequent encounter for fracture with routine healing: Secondary | ICD-10-CM | POA: Diagnosis not present

## 2016-03-11 DIAGNOSIS — Z9181 History of falling: Secondary | ICD-10-CM | POA: Diagnosis not present

## 2016-03-11 DIAGNOSIS — Z7901 Long term (current) use of anticoagulants: Secondary | ICD-10-CM | POA: Diagnosis not present

## 2016-03-11 DIAGNOSIS — W07XXXD Fall from chair, subsequent encounter: Secondary | ICD-10-CM | POA: Diagnosis not present

## 2016-03-11 DIAGNOSIS — I110 Hypertensive heart disease with heart failure: Secondary | ICD-10-CM | POA: Diagnosis not present

## 2016-03-11 DIAGNOSIS — I4891 Unspecified atrial fibrillation: Secondary | ICD-10-CM | POA: Diagnosis not present

## 2016-03-15 DIAGNOSIS — S32501D Unspecified fracture of right pubis, subsequent encounter for fracture with routine healing: Secondary | ICD-10-CM | POA: Diagnosis not present

## 2016-03-15 DIAGNOSIS — Z9181 History of falling: Secondary | ICD-10-CM | POA: Diagnosis not present

## 2016-03-15 DIAGNOSIS — I2510 Atherosclerotic heart disease of native coronary artery without angina pectoris: Secondary | ICD-10-CM | POA: Diagnosis not present

## 2016-03-15 DIAGNOSIS — I110 Hypertensive heart disease with heart failure: Secondary | ICD-10-CM | POA: Diagnosis not present

## 2016-03-15 DIAGNOSIS — I4892 Unspecified atrial flutter: Secondary | ICD-10-CM | POA: Diagnosis not present

## 2016-03-15 DIAGNOSIS — W07XXXD Fall from chair, subsequent encounter: Secondary | ICD-10-CM | POA: Diagnosis not present

## 2016-03-15 DIAGNOSIS — Z7901 Long term (current) use of anticoagulants: Secondary | ICD-10-CM | POA: Diagnosis not present

## 2016-03-15 DIAGNOSIS — I5032 Chronic diastolic (congestive) heart failure: Secondary | ICD-10-CM | POA: Diagnosis not present

## 2016-03-15 DIAGNOSIS — I4891 Unspecified atrial fibrillation: Secondary | ICD-10-CM | POA: Diagnosis not present

## 2016-03-16 DIAGNOSIS — I4891 Unspecified atrial fibrillation: Secondary | ICD-10-CM | POA: Diagnosis not present

## 2016-03-16 DIAGNOSIS — I110 Hypertensive heart disease with heart failure: Secondary | ICD-10-CM | POA: Diagnosis not present

## 2016-03-16 DIAGNOSIS — I4892 Unspecified atrial flutter: Secondary | ICD-10-CM | POA: Diagnosis not present

## 2016-03-16 DIAGNOSIS — I2510 Atherosclerotic heart disease of native coronary artery without angina pectoris: Secondary | ICD-10-CM | POA: Diagnosis not present

## 2016-03-16 DIAGNOSIS — S32501D Unspecified fracture of right pubis, subsequent encounter for fracture with routine healing: Secondary | ICD-10-CM | POA: Diagnosis not present

## 2016-03-16 DIAGNOSIS — W07XXXD Fall from chair, subsequent encounter: Secondary | ICD-10-CM | POA: Diagnosis not present

## 2016-03-16 DIAGNOSIS — Z9181 History of falling: Secondary | ICD-10-CM | POA: Diagnosis not present

## 2016-03-16 DIAGNOSIS — Z7901 Long term (current) use of anticoagulants: Secondary | ICD-10-CM | POA: Diagnosis not present

## 2016-03-16 DIAGNOSIS — I5032 Chronic diastolic (congestive) heart failure: Secondary | ICD-10-CM | POA: Diagnosis not present

## 2016-03-17 DIAGNOSIS — I110 Hypertensive heart disease with heart failure: Secondary | ICD-10-CM | POA: Diagnosis not present

## 2016-03-17 DIAGNOSIS — I4891 Unspecified atrial fibrillation: Secondary | ICD-10-CM | POA: Diagnosis not present

## 2016-03-17 DIAGNOSIS — W07XXXD Fall from chair, subsequent encounter: Secondary | ICD-10-CM | POA: Diagnosis not present

## 2016-03-17 DIAGNOSIS — I4892 Unspecified atrial flutter: Secondary | ICD-10-CM | POA: Diagnosis not present

## 2016-03-17 DIAGNOSIS — I2510 Atherosclerotic heart disease of native coronary artery without angina pectoris: Secondary | ICD-10-CM | POA: Diagnosis not present

## 2016-03-17 DIAGNOSIS — Z9181 History of falling: Secondary | ICD-10-CM | POA: Diagnosis not present

## 2016-03-17 DIAGNOSIS — Z7901 Long term (current) use of anticoagulants: Secondary | ICD-10-CM | POA: Diagnosis not present

## 2016-03-17 DIAGNOSIS — I5032 Chronic diastolic (congestive) heart failure: Secondary | ICD-10-CM | POA: Diagnosis not present

## 2016-03-17 DIAGNOSIS — S32501D Unspecified fracture of right pubis, subsequent encounter for fracture with routine healing: Secondary | ICD-10-CM | POA: Diagnosis not present

## 2016-03-18 DIAGNOSIS — Z9181 History of falling: Secondary | ICD-10-CM | POA: Diagnosis not present

## 2016-03-18 DIAGNOSIS — I4892 Unspecified atrial flutter: Secondary | ICD-10-CM | POA: Diagnosis not present

## 2016-03-18 DIAGNOSIS — I5032 Chronic diastolic (congestive) heart failure: Secondary | ICD-10-CM | POA: Diagnosis not present

## 2016-03-18 DIAGNOSIS — W07XXXD Fall from chair, subsequent encounter: Secondary | ICD-10-CM | POA: Diagnosis not present

## 2016-03-18 DIAGNOSIS — Z7901 Long term (current) use of anticoagulants: Secondary | ICD-10-CM | POA: Diagnosis not present

## 2016-03-18 DIAGNOSIS — I110 Hypertensive heart disease with heart failure: Secondary | ICD-10-CM | POA: Diagnosis not present

## 2016-03-18 DIAGNOSIS — I10 Essential (primary) hypertension: Secondary | ICD-10-CM | POA: Diagnosis not present

## 2016-03-18 DIAGNOSIS — I2510 Atherosclerotic heart disease of native coronary artery without angina pectoris: Secondary | ICD-10-CM | POA: Diagnosis not present

## 2016-03-18 DIAGNOSIS — I4891 Unspecified atrial fibrillation: Secondary | ICD-10-CM | POA: Diagnosis not present

## 2016-03-18 DIAGNOSIS — S32501D Unspecified fracture of right pubis, subsequent encounter for fracture with routine healing: Secondary | ICD-10-CM | POA: Diagnosis not present

## 2016-03-22 DIAGNOSIS — I110 Hypertensive heart disease with heart failure: Secondary | ICD-10-CM | POA: Diagnosis not present

## 2016-03-22 DIAGNOSIS — I5032 Chronic diastolic (congestive) heart failure: Secondary | ICD-10-CM | POA: Diagnosis not present

## 2016-03-22 DIAGNOSIS — S32501D Unspecified fracture of right pubis, subsequent encounter for fracture with routine healing: Secondary | ICD-10-CM | POA: Diagnosis not present

## 2016-03-22 DIAGNOSIS — I2510 Atherosclerotic heart disease of native coronary artery without angina pectoris: Secondary | ICD-10-CM | POA: Diagnosis not present

## 2016-03-22 DIAGNOSIS — Z7901 Long term (current) use of anticoagulants: Secondary | ICD-10-CM | POA: Diagnosis not present

## 2016-03-22 DIAGNOSIS — I4891 Unspecified atrial fibrillation: Secondary | ICD-10-CM | POA: Diagnosis not present

## 2016-03-22 DIAGNOSIS — W07XXXD Fall from chair, subsequent encounter: Secondary | ICD-10-CM | POA: Diagnosis not present

## 2016-03-22 DIAGNOSIS — Z9181 History of falling: Secondary | ICD-10-CM | POA: Diagnosis not present

## 2016-03-22 DIAGNOSIS — I4892 Unspecified atrial flutter: Secondary | ICD-10-CM | POA: Diagnosis not present

## 2016-03-24 DIAGNOSIS — I4892 Unspecified atrial flutter: Secondary | ICD-10-CM | POA: Diagnosis not present

## 2016-03-24 DIAGNOSIS — Z7901 Long term (current) use of anticoagulants: Secondary | ICD-10-CM | POA: Diagnosis not present

## 2016-03-24 DIAGNOSIS — W07XXXD Fall from chair, subsequent encounter: Secondary | ICD-10-CM | POA: Diagnosis not present

## 2016-03-24 DIAGNOSIS — I110 Hypertensive heart disease with heart failure: Secondary | ICD-10-CM | POA: Diagnosis not present

## 2016-03-24 DIAGNOSIS — S32501D Unspecified fracture of right pubis, subsequent encounter for fracture with routine healing: Secondary | ICD-10-CM | POA: Diagnosis not present

## 2016-03-24 DIAGNOSIS — I4891 Unspecified atrial fibrillation: Secondary | ICD-10-CM | POA: Diagnosis not present

## 2016-03-24 DIAGNOSIS — Z9181 History of falling: Secondary | ICD-10-CM | POA: Diagnosis not present

## 2016-03-24 DIAGNOSIS — I2510 Atherosclerotic heart disease of native coronary artery without angina pectoris: Secondary | ICD-10-CM | POA: Diagnosis not present

## 2016-03-24 DIAGNOSIS — I5032 Chronic diastolic (congestive) heart failure: Secondary | ICD-10-CM | POA: Diagnosis not present

## 2016-03-25 DIAGNOSIS — I4892 Unspecified atrial flutter: Secondary | ICD-10-CM | POA: Diagnosis not present

## 2016-03-25 DIAGNOSIS — Z7901 Long term (current) use of anticoagulants: Secondary | ICD-10-CM | POA: Diagnosis not present

## 2016-03-25 DIAGNOSIS — S32501D Unspecified fracture of right pubis, subsequent encounter for fracture with routine healing: Secondary | ICD-10-CM | POA: Diagnosis not present

## 2016-03-25 DIAGNOSIS — W07XXXD Fall from chair, subsequent encounter: Secondary | ICD-10-CM | POA: Diagnosis not present

## 2016-03-25 DIAGNOSIS — I4891 Unspecified atrial fibrillation: Secondary | ICD-10-CM | POA: Diagnosis not present

## 2016-03-25 DIAGNOSIS — Z9181 History of falling: Secondary | ICD-10-CM | POA: Diagnosis not present

## 2016-03-25 DIAGNOSIS — I5032 Chronic diastolic (congestive) heart failure: Secondary | ICD-10-CM | POA: Diagnosis not present

## 2016-03-25 DIAGNOSIS — I2510 Atherosclerotic heart disease of native coronary artery without angina pectoris: Secondary | ICD-10-CM | POA: Diagnosis not present

## 2016-03-25 DIAGNOSIS — I110 Hypertensive heart disease with heart failure: Secondary | ICD-10-CM | POA: Diagnosis not present

## 2016-03-29 DIAGNOSIS — I5032 Chronic diastolic (congestive) heart failure: Secondary | ICD-10-CM | POA: Diagnosis not present

## 2016-03-29 DIAGNOSIS — Z9181 History of falling: Secondary | ICD-10-CM | POA: Diagnosis not present

## 2016-03-29 DIAGNOSIS — S32501D Unspecified fracture of right pubis, subsequent encounter for fracture with routine healing: Secondary | ICD-10-CM | POA: Diagnosis not present

## 2016-03-29 DIAGNOSIS — I4892 Unspecified atrial flutter: Secondary | ICD-10-CM | POA: Diagnosis not present

## 2016-03-29 DIAGNOSIS — W07XXXD Fall from chair, subsequent encounter: Secondary | ICD-10-CM | POA: Diagnosis not present

## 2016-03-29 DIAGNOSIS — I2510 Atherosclerotic heart disease of native coronary artery without angina pectoris: Secondary | ICD-10-CM | POA: Diagnosis not present

## 2016-03-29 DIAGNOSIS — Z7901 Long term (current) use of anticoagulants: Secondary | ICD-10-CM | POA: Diagnosis not present

## 2016-03-29 DIAGNOSIS — I4891 Unspecified atrial fibrillation: Secondary | ICD-10-CM | POA: Diagnosis not present

## 2016-03-29 DIAGNOSIS — I110 Hypertensive heart disease with heart failure: Secondary | ICD-10-CM | POA: Diagnosis not present

## 2016-03-31 DIAGNOSIS — I4892 Unspecified atrial flutter: Secondary | ICD-10-CM | POA: Diagnosis not present

## 2016-03-31 DIAGNOSIS — I2510 Atherosclerotic heart disease of native coronary artery without angina pectoris: Secondary | ICD-10-CM | POA: Diagnosis not present

## 2016-03-31 DIAGNOSIS — I4891 Unspecified atrial fibrillation: Secondary | ICD-10-CM | POA: Diagnosis not present

## 2016-03-31 DIAGNOSIS — I110 Hypertensive heart disease with heart failure: Secondary | ICD-10-CM | POA: Diagnosis not present

## 2016-03-31 DIAGNOSIS — I5032 Chronic diastolic (congestive) heart failure: Secondary | ICD-10-CM | POA: Diagnosis not present

## 2016-03-31 DIAGNOSIS — W07XXXD Fall from chair, subsequent encounter: Secondary | ICD-10-CM | POA: Diagnosis not present

## 2016-03-31 DIAGNOSIS — Z9181 History of falling: Secondary | ICD-10-CM | POA: Diagnosis not present

## 2016-03-31 DIAGNOSIS — Z7901 Long term (current) use of anticoagulants: Secondary | ICD-10-CM | POA: Diagnosis not present

## 2016-03-31 DIAGNOSIS — S32501D Unspecified fracture of right pubis, subsequent encounter for fracture with routine healing: Secondary | ICD-10-CM | POA: Diagnosis not present

## 2016-04-02 ENCOUNTER — Other Ambulatory Visit: Payer: Self-pay | Admitting: Adult Health

## 2016-04-02 DIAGNOSIS — I2510 Atherosclerotic heart disease of native coronary artery without angina pectoris: Secondary | ICD-10-CM | POA: Diagnosis not present

## 2016-04-02 DIAGNOSIS — I4892 Unspecified atrial flutter: Secondary | ICD-10-CM | POA: Diagnosis not present

## 2016-04-02 DIAGNOSIS — W07XXXD Fall from chair, subsequent encounter: Secondary | ICD-10-CM | POA: Diagnosis not present

## 2016-04-02 DIAGNOSIS — I4891 Unspecified atrial fibrillation: Secondary | ICD-10-CM | POA: Diagnosis not present

## 2016-04-02 DIAGNOSIS — Z7901 Long term (current) use of anticoagulants: Secondary | ICD-10-CM | POA: Diagnosis not present

## 2016-04-02 DIAGNOSIS — Z9181 History of falling: Secondary | ICD-10-CM | POA: Diagnosis not present

## 2016-04-02 DIAGNOSIS — I110 Hypertensive heart disease with heart failure: Secondary | ICD-10-CM | POA: Diagnosis not present

## 2016-04-02 DIAGNOSIS — S32501D Unspecified fracture of right pubis, subsequent encounter for fracture with routine healing: Secondary | ICD-10-CM | POA: Diagnosis not present

## 2016-04-02 DIAGNOSIS — I5032 Chronic diastolic (congestive) heart failure: Secondary | ICD-10-CM | POA: Diagnosis not present

## 2016-04-05 DIAGNOSIS — I110 Hypertensive heart disease with heart failure: Secondary | ICD-10-CM | POA: Diagnosis not present

## 2016-04-05 DIAGNOSIS — W07XXXD Fall from chair, subsequent encounter: Secondary | ICD-10-CM | POA: Diagnosis not present

## 2016-04-05 DIAGNOSIS — Z7901 Long term (current) use of anticoagulants: Secondary | ICD-10-CM | POA: Diagnosis not present

## 2016-04-05 DIAGNOSIS — Z9181 History of falling: Secondary | ICD-10-CM | POA: Diagnosis not present

## 2016-04-05 DIAGNOSIS — I4892 Unspecified atrial flutter: Secondary | ICD-10-CM | POA: Diagnosis not present

## 2016-04-05 DIAGNOSIS — I4891 Unspecified atrial fibrillation: Secondary | ICD-10-CM | POA: Diagnosis not present

## 2016-04-05 DIAGNOSIS — I2510 Atherosclerotic heart disease of native coronary artery without angina pectoris: Secondary | ICD-10-CM | POA: Diagnosis not present

## 2016-04-05 DIAGNOSIS — S32501D Unspecified fracture of right pubis, subsequent encounter for fracture with routine healing: Secondary | ICD-10-CM | POA: Diagnosis not present

## 2016-04-05 DIAGNOSIS — I5032 Chronic diastolic (congestive) heart failure: Secondary | ICD-10-CM | POA: Diagnosis not present

## 2016-04-08 DIAGNOSIS — W07XXXD Fall from chair, subsequent encounter: Secondary | ICD-10-CM | POA: Diagnosis not present

## 2016-04-08 DIAGNOSIS — Z9181 History of falling: Secondary | ICD-10-CM | POA: Diagnosis not present

## 2016-04-08 DIAGNOSIS — I4891 Unspecified atrial fibrillation: Secondary | ICD-10-CM | POA: Diagnosis not present

## 2016-04-08 DIAGNOSIS — Z7901 Long term (current) use of anticoagulants: Secondary | ICD-10-CM | POA: Diagnosis not present

## 2016-04-08 DIAGNOSIS — I2510 Atherosclerotic heart disease of native coronary artery without angina pectoris: Secondary | ICD-10-CM | POA: Diagnosis not present

## 2016-04-08 DIAGNOSIS — I5032 Chronic diastolic (congestive) heart failure: Secondary | ICD-10-CM | POA: Diagnosis not present

## 2016-04-08 DIAGNOSIS — I110 Hypertensive heart disease with heart failure: Secondary | ICD-10-CM | POA: Diagnosis not present

## 2016-04-08 DIAGNOSIS — I4892 Unspecified atrial flutter: Secondary | ICD-10-CM | POA: Diagnosis not present

## 2016-04-08 DIAGNOSIS — S32501D Unspecified fracture of right pubis, subsequent encounter for fracture with routine healing: Secondary | ICD-10-CM | POA: Diagnosis not present

## 2016-04-14 DIAGNOSIS — W07XXXD Fall from chair, subsequent encounter: Secondary | ICD-10-CM | POA: Diagnosis not present

## 2016-04-14 DIAGNOSIS — I4891 Unspecified atrial fibrillation: Secondary | ICD-10-CM | POA: Diagnosis not present

## 2016-04-14 DIAGNOSIS — I5032 Chronic diastolic (congestive) heart failure: Secondary | ICD-10-CM | POA: Diagnosis not present

## 2016-04-14 DIAGNOSIS — Z7901 Long term (current) use of anticoagulants: Secondary | ICD-10-CM | POA: Diagnosis not present

## 2016-04-14 DIAGNOSIS — I110 Hypertensive heart disease with heart failure: Secondary | ICD-10-CM | POA: Diagnosis not present

## 2016-04-14 DIAGNOSIS — Z9181 History of falling: Secondary | ICD-10-CM | POA: Diagnosis not present

## 2016-04-14 DIAGNOSIS — I2510 Atherosclerotic heart disease of native coronary artery without angina pectoris: Secondary | ICD-10-CM | POA: Diagnosis not present

## 2016-04-14 DIAGNOSIS — I4892 Unspecified atrial flutter: Secondary | ICD-10-CM | POA: Diagnosis not present

## 2016-04-14 DIAGNOSIS — S32501D Unspecified fracture of right pubis, subsequent encounter for fracture with routine healing: Secondary | ICD-10-CM | POA: Diagnosis not present

## 2016-04-15 DIAGNOSIS — I10 Essential (primary) hypertension: Secondary | ICD-10-CM | POA: Diagnosis not present

## 2016-04-15 DIAGNOSIS — R739 Hyperglycemia, unspecified: Secondary | ICD-10-CM | POA: Diagnosis not present

## 2016-04-15 DIAGNOSIS — I480 Paroxysmal atrial fibrillation: Secondary | ICD-10-CM | POA: Diagnosis not present

## 2016-04-15 DIAGNOSIS — I4891 Unspecified atrial fibrillation: Secondary | ICD-10-CM | POA: Diagnosis not present

## 2016-04-15 DIAGNOSIS — Z7901 Long term (current) use of anticoagulants: Secondary | ICD-10-CM | POA: Diagnosis not present

## 2016-04-16 DIAGNOSIS — Z7901 Long term (current) use of anticoagulants: Secondary | ICD-10-CM | POA: Diagnosis not present

## 2016-04-16 DIAGNOSIS — I5032 Chronic diastolic (congestive) heart failure: Secondary | ICD-10-CM | POA: Diagnosis not present

## 2016-04-16 DIAGNOSIS — I4892 Unspecified atrial flutter: Secondary | ICD-10-CM | POA: Diagnosis not present

## 2016-04-16 DIAGNOSIS — I4891 Unspecified atrial fibrillation: Secondary | ICD-10-CM | POA: Diagnosis not present

## 2016-04-16 DIAGNOSIS — I110 Hypertensive heart disease with heart failure: Secondary | ICD-10-CM | POA: Diagnosis not present

## 2016-04-16 DIAGNOSIS — W07XXXD Fall from chair, subsequent encounter: Secondary | ICD-10-CM | POA: Diagnosis not present

## 2016-04-16 DIAGNOSIS — Z9181 History of falling: Secondary | ICD-10-CM | POA: Diagnosis not present

## 2016-04-16 DIAGNOSIS — S32501D Unspecified fracture of right pubis, subsequent encounter for fracture with routine healing: Secondary | ICD-10-CM | POA: Diagnosis not present

## 2016-04-16 DIAGNOSIS — I2510 Atherosclerotic heart disease of native coronary artery without angina pectoris: Secondary | ICD-10-CM | POA: Diagnosis not present

## 2016-04-20 DIAGNOSIS — R739 Hyperglycemia, unspecified: Secondary | ICD-10-CM | POA: Diagnosis not present

## 2016-04-20 DIAGNOSIS — I10 Essential (primary) hypertension: Secondary | ICD-10-CM | POA: Diagnosis not present

## 2016-04-20 DIAGNOSIS — I480 Paroxysmal atrial fibrillation: Secondary | ICD-10-CM | POA: Diagnosis not present

## 2016-04-21 ENCOUNTER — Other Ambulatory Visit: Payer: Self-pay | Admitting: Adult Health

## 2016-04-21 DIAGNOSIS — I110 Hypertensive heart disease with heart failure: Secondary | ICD-10-CM | POA: Diagnosis not present

## 2016-04-21 DIAGNOSIS — I4892 Unspecified atrial flutter: Secondary | ICD-10-CM | POA: Diagnosis not present

## 2016-04-21 DIAGNOSIS — I2510 Atherosclerotic heart disease of native coronary artery without angina pectoris: Secondary | ICD-10-CM | POA: Diagnosis not present

## 2016-04-21 DIAGNOSIS — Z7901 Long term (current) use of anticoagulants: Secondary | ICD-10-CM | POA: Diagnosis not present

## 2016-04-21 DIAGNOSIS — Z9181 History of falling: Secondary | ICD-10-CM | POA: Diagnosis not present

## 2016-04-21 DIAGNOSIS — I5032 Chronic diastolic (congestive) heart failure: Secondary | ICD-10-CM | POA: Diagnosis not present

## 2016-04-21 DIAGNOSIS — W07XXXD Fall from chair, subsequent encounter: Secondary | ICD-10-CM | POA: Diagnosis not present

## 2016-04-21 DIAGNOSIS — I4891 Unspecified atrial fibrillation: Secondary | ICD-10-CM | POA: Diagnosis not present

## 2016-04-21 DIAGNOSIS — S32501D Unspecified fracture of right pubis, subsequent encounter for fracture with routine healing: Secondary | ICD-10-CM | POA: Diagnosis not present

## 2016-04-24 ENCOUNTER — Other Ambulatory Visit: Payer: Self-pay | Admitting: Adult Health

## 2016-04-26 DIAGNOSIS — Z7901 Long term (current) use of anticoagulants: Secondary | ICD-10-CM | POA: Diagnosis not present

## 2016-04-26 DIAGNOSIS — I4892 Unspecified atrial flutter: Secondary | ICD-10-CM | POA: Diagnosis not present

## 2016-04-26 DIAGNOSIS — W07XXXD Fall from chair, subsequent encounter: Secondary | ICD-10-CM | POA: Diagnosis not present

## 2016-04-26 DIAGNOSIS — Z9181 History of falling: Secondary | ICD-10-CM | POA: Diagnosis not present

## 2016-04-26 DIAGNOSIS — I2510 Atherosclerotic heart disease of native coronary artery without angina pectoris: Secondary | ICD-10-CM | POA: Diagnosis not present

## 2016-04-26 DIAGNOSIS — S32501D Unspecified fracture of right pubis, subsequent encounter for fracture with routine healing: Secondary | ICD-10-CM | POA: Diagnosis not present

## 2016-04-26 DIAGNOSIS — I110 Hypertensive heart disease with heart failure: Secondary | ICD-10-CM | POA: Diagnosis not present

## 2016-04-26 DIAGNOSIS — I5032 Chronic diastolic (congestive) heart failure: Secondary | ICD-10-CM | POA: Diagnosis not present

## 2016-04-26 DIAGNOSIS — I4891 Unspecified atrial fibrillation: Secondary | ICD-10-CM | POA: Diagnosis not present

## 2016-05-20 DIAGNOSIS — I4891 Unspecified atrial fibrillation: Secondary | ICD-10-CM | POA: Diagnosis not present

## 2016-05-20 DIAGNOSIS — H401113 Primary open-angle glaucoma, right eye, severe stage: Secondary | ICD-10-CM | POA: Diagnosis not present

## 2016-05-20 DIAGNOSIS — H401122 Primary open-angle glaucoma, left eye, moderate stage: Secondary | ICD-10-CM | POA: Diagnosis not present

## 2016-05-20 DIAGNOSIS — H04123 Dry eye syndrome of bilateral lacrimal glands: Secondary | ICD-10-CM | POA: Diagnosis not present

## 2016-05-20 DIAGNOSIS — Z7901 Long term (current) use of anticoagulants: Secondary | ICD-10-CM | POA: Diagnosis not present

## 2016-06-17 DIAGNOSIS — H35351 Cystoid macular degeneration, right eye: Secondary | ICD-10-CM | POA: Diagnosis not present

## 2016-06-17 DIAGNOSIS — H34831 Tributary (branch) retinal vein occlusion, right eye, with macular edema: Secondary | ICD-10-CM | POA: Diagnosis not present

## 2016-06-17 DIAGNOSIS — H3581 Retinal edema: Secondary | ICD-10-CM | POA: Diagnosis not present

## 2016-06-17 DIAGNOSIS — H353132 Nonexudative age-related macular degeneration, bilateral, intermediate dry stage: Secondary | ICD-10-CM | POA: Diagnosis not present

## 2016-06-22 DIAGNOSIS — N63 Unspecified lump in breast: Secondary | ICD-10-CM | POA: Diagnosis not present

## 2016-06-22 DIAGNOSIS — Z853 Personal history of malignant neoplasm of breast: Secondary | ICD-10-CM | POA: Diagnosis not present

## 2016-06-23 DIAGNOSIS — H34831 Tributary (branch) retinal vein occlusion, right eye, with macular edema: Secondary | ICD-10-CM | POA: Diagnosis not present

## 2016-06-24 DIAGNOSIS — Z7901 Long term (current) use of anticoagulants: Secondary | ICD-10-CM | POA: Diagnosis not present

## 2016-06-24 DIAGNOSIS — I4891 Unspecified atrial fibrillation: Secondary | ICD-10-CM | POA: Diagnosis not present

## 2016-07-05 DIAGNOSIS — I10 Essential (primary) hypertension: Secondary | ICD-10-CM | POA: Diagnosis not present

## 2016-07-05 DIAGNOSIS — I4891 Unspecified atrial fibrillation: Secondary | ICD-10-CM | POA: Diagnosis not present

## 2016-07-05 DIAGNOSIS — Z0000 Encounter for general adult medical examination without abnormal findings: Secondary | ICD-10-CM | POA: Diagnosis not present

## 2016-07-05 DIAGNOSIS — M810 Age-related osteoporosis without current pathological fracture: Secondary | ICD-10-CM | POA: Diagnosis not present

## 2016-08-02 DIAGNOSIS — Z7901 Long term (current) use of anticoagulants: Secondary | ICD-10-CM | POA: Diagnosis not present

## 2016-08-02 DIAGNOSIS — I4891 Unspecified atrial fibrillation: Secondary | ICD-10-CM | POA: Diagnosis not present

## 2016-08-04 DIAGNOSIS — H34831 Tributary (branch) retinal vein occlusion, right eye, with macular edema: Secondary | ICD-10-CM | POA: Diagnosis not present

## 2016-08-05 DIAGNOSIS — H401113 Primary open-angle glaucoma, right eye, severe stage: Secondary | ICD-10-CM | POA: Diagnosis not present

## 2016-08-05 DIAGNOSIS — H34831 Tributary (branch) retinal vein occlusion, right eye, with macular edema: Secondary | ICD-10-CM | POA: Diagnosis not present

## 2016-08-05 DIAGNOSIS — H40051 Ocular hypertension, right eye: Secondary | ICD-10-CM | POA: Diagnosis not present

## 2016-08-05 DIAGNOSIS — H401122 Primary open-angle glaucoma, left eye, moderate stage: Secondary | ICD-10-CM | POA: Diagnosis not present

## 2016-08-31 ENCOUNTER — Other Ambulatory Visit: Payer: Self-pay | Admitting: Adult Health

## 2016-09-02 DIAGNOSIS — I4891 Unspecified atrial fibrillation: Secondary | ICD-10-CM | POA: Diagnosis not present

## 2016-09-02 DIAGNOSIS — Z7901 Long term (current) use of anticoagulants: Secondary | ICD-10-CM | POA: Diagnosis not present

## 2016-09-15 DIAGNOSIS — H34831 Tributary (branch) retinal vein occlusion, right eye, with macular edema: Secondary | ICD-10-CM | POA: Diagnosis not present

## 2016-10-07 DIAGNOSIS — I4891 Unspecified atrial fibrillation: Secondary | ICD-10-CM | POA: Diagnosis not present

## 2016-10-07 DIAGNOSIS — Z7901 Long term (current) use of anticoagulants: Secondary | ICD-10-CM | POA: Diagnosis not present

## 2016-10-27 DIAGNOSIS — H35351 Cystoid macular degeneration, right eye: Secondary | ICD-10-CM | POA: Diagnosis not present

## 2016-10-27 DIAGNOSIS — H353212 Exudative age-related macular degeneration, right eye, with inactive choroidal neovascularization: Secondary | ICD-10-CM | POA: Diagnosis not present

## 2016-10-27 DIAGNOSIS — H353132 Nonexudative age-related macular degeneration, bilateral, intermediate dry stage: Secondary | ICD-10-CM | POA: Diagnosis not present

## 2016-10-27 DIAGNOSIS — H34831 Tributary (branch) retinal vein occlusion, right eye, with macular edema: Secondary | ICD-10-CM | POA: Diagnosis not present

## 2016-11-04 DIAGNOSIS — I4891 Unspecified atrial fibrillation: Secondary | ICD-10-CM | POA: Diagnosis not present

## 2016-11-04 DIAGNOSIS — Z7901 Long term (current) use of anticoagulants: Secondary | ICD-10-CM | POA: Diagnosis not present

## 2016-11-11 DIAGNOSIS — H401122 Primary open-angle glaucoma, left eye, moderate stage: Secondary | ICD-10-CM | POA: Diagnosis not present

## 2016-11-11 DIAGNOSIS — H40051 Ocular hypertension, right eye: Secondary | ICD-10-CM | POA: Diagnosis not present

## 2016-11-11 DIAGNOSIS — H401113 Primary open-angle glaucoma, right eye, severe stage: Secondary | ICD-10-CM | POA: Diagnosis not present

## 2016-12-02 DIAGNOSIS — I4891 Unspecified atrial fibrillation: Secondary | ICD-10-CM | POA: Diagnosis not present

## 2016-12-02 DIAGNOSIS — Z7901 Long term (current) use of anticoagulants: Secondary | ICD-10-CM | POA: Diagnosis not present

## 2016-12-07 DIAGNOSIS — H34831 Tributary (branch) retinal vein occlusion, right eye, with macular edema: Secondary | ICD-10-CM | POA: Diagnosis not present

## 2017-01-03 DIAGNOSIS — N390 Urinary tract infection, site not specified: Secondary | ICD-10-CM | POA: Diagnosis not present

## 2017-01-03 DIAGNOSIS — I10 Essential (primary) hypertension: Secondary | ICD-10-CM | POA: Diagnosis not present

## 2017-01-03 DIAGNOSIS — I4891 Unspecified atrial fibrillation: Secondary | ICD-10-CM | POA: Diagnosis not present

## 2017-01-03 DIAGNOSIS — M810 Age-related osteoporosis without current pathological fracture: Secondary | ICD-10-CM | POA: Diagnosis not present

## 2017-01-03 DIAGNOSIS — E559 Vitamin D deficiency, unspecified: Secondary | ICD-10-CM | POA: Diagnosis not present

## 2017-01-06 DIAGNOSIS — Z7901 Long term (current) use of anticoagulants: Secondary | ICD-10-CM | POA: Diagnosis not present

## 2017-01-06 DIAGNOSIS — I4891 Unspecified atrial fibrillation: Secondary | ICD-10-CM | POA: Diagnosis not present

## 2017-01-10 DIAGNOSIS — I10 Essential (primary) hypertension: Secondary | ICD-10-CM | POA: Diagnosis not present

## 2017-01-10 DIAGNOSIS — Z0000 Encounter for general adult medical examination without abnormal findings: Secondary | ICD-10-CM | POA: Diagnosis not present

## 2017-01-10 DIAGNOSIS — M810 Age-related osteoporosis without current pathological fracture: Secondary | ICD-10-CM | POA: Diagnosis not present

## 2017-01-10 DIAGNOSIS — I4891 Unspecified atrial fibrillation: Secondary | ICD-10-CM | POA: Diagnosis not present

## 2017-01-17 ENCOUNTER — Other Ambulatory Visit: Payer: Self-pay

## 2017-01-17 NOTE — Patient Outreach (Signed)
Calling for Medication Adherence pt did not want any help she said .she has some medication of lisinopril 20 mg she has a pill box and she does not want a 90 days supply.   [place] Management Direct Dial [phone]  Fax [phone] [email or web address]

## 2017-01-18 DIAGNOSIS — H353132 Nonexudative age-related macular degeneration, bilateral, intermediate dry stage: Secondary | ICD-10-CM | POA: Diagnosis not present

## 2017-01-18 DIAGNOSIS — H34831 Tributary (branch) retinal vein occlusion, right eye, with macular edema: Secondary | ICD-10-CM | POA: Diagnosis not present

## 2017-01-27 DIAGNOSIS — I10 Essential (primary) hypertension: Secondary | ICD-10-CM | POA: Diagnosis not present

## 2017-01-27 DIAGNOSIS — I4891 Unspecified atrial fibrillation: Secondary | ICD-10-CM | POA: Diagnosis not present

## 2017-02-03 DIAGNOSIS — Z7901 Long term (current) use of anticoagulants: Secondary | ICD-10-CM | POA: Diagnosis not present

## 2017-02-03 DIAGNOSIS — I4891 Unspecified atrial fibrillation: Secondary | ICD-10-CM | POA: Diagnosis not present

## 2017-03-03 DIAGNOSIS — Z7901 Long term (current) use of anticoagulants: Secondary | ICD-10-CM | POA: Diagnosis not present

## 2017-03-03 DIAGNOSIS — I4891 Unspecified atrial fibrillation: Secondary | ICD-10-CM | POA: Diagnosis not present

## 2017-03-09 DIAGNOSIS — H348312 Tributary (branch) retinal vein occlusion, right eye, stable: Secondary | ICD-10-CM | POA: Diagnosis not present

## 2017-03-09 DIAGNOSIS — H353132 Nonexudative age-related macular degeneration, bilateral, intermediate dry stage: Secondary | ICD-10-CM | POA: Diagnosis not present

## 2017-03-09 DIAGNOSIS — H35351 Cystoid macular degeneration, right eye: Secondary | ICD-10-CM | POA: Diagnosis not present

## 2017-03-09 DIAGNOSIS — H353212 Exudative age-related macular degeneration, right eye, with inactive choroidal neovascularization: Secondary | ICD-10-CM | POA: Diagnosis not present

## 2017-03-31 DIAGNOSIS — Z7901 Long term (current) use of anticoagulants: Secondary | ICD-10-CM | POA: Diagnosis not present

## 2017-03-31 DIAGNOSIS — I4891 Unspecified atrial fibrillation: Secondary | ICD-10-CM | POA: Diagnosis not present

## 2017-04-14 DIAGNOSIS — Z7901 Long term (current) use of anticoagulants: Secondary | ICD-10-CM | POA: Diagnosis not present

## 2017-04-14 DIAGNOSIS — I4891 Unspecified atrial fibrillation: Secondary | ICD-10-CM | POA: Diagnosis not present

## 2017-05-04 DIAGNOSIS — H34831 Tributary (branch) retinal vein occlusion, right eye, with macular edema: Secondary | ICD-10-CM | POA: Diagnosis not present

## 2017-05-04 DIAGNOSIS — H353132 Nonexudative age-related macular degeneration, bilateral, intermediate dry stage: Secondary | ICD-10-CM | POA: Diagnosis not present

## 2017-05-04 DIAGNOSIS — H353212 Exudative age-related macular degeneration, right eye, with inactive choroidal neovascularization: Secondary | ICD-10-CM | POA: Diagnosis not present

## 2017-05-04 DIAGNOSIS — H35351 Cystoid macular degeneration, right eye: Secondary | ICD-10-CM | POA: Diagnosis not present

## 2017-05-06 DIAGNOSIS — H34831 Tributary (branch) retinal vein occlusion, right eye, with macular edema: Secondary | ICD-10-CM | POA: Diagnosis not present

## 2017-05-12 DIAGNOSIS — Z7901 Long term (current) use of anticoagulants: Secondary | ICD-10-CM | POA: Diagnosis not present

## 2017-05-12 DIAGNOSIS — I4891 Unspecified atrial fibrillation: Secondary | ICD-10-CM | POA: Diagnosis not present

## 2017-05-18 DIAGNOSIS — H34831 Tributary (branch) retinal vein occlusion, right eye, with macular edema: Secondary | ICD-10-CM | POA: Diagnosis not present

## 2017-05-26 DIAGNOSIS — M810 Age-related osteoporosis without current pathological fracture: Secondary | ICD-10-CM | POA: Diagnosis not present

## 2017-05-26 DIAGNOSIS — I10 Essential (primary) hypertension: Secondary | ICD-10-CM | POA: Diagnosis not present

## 2017-05-26 DIAGNOSIS — E7800 Pure hypercholesterolemia, unspecified: Secondary | ICD-10-CM | POA: Diagnosis not present

## 2017-05-26 DIAGNOSIS — I4891 Unspecified atrial fibrillation: Secondary | ICD-10-CM | POA: Diagnosis not present

## 2017-06-09 DIAGNOSIS — H40051 Ocular hypertension, right eye: Secondary | ICD-10-CM | POA: Diagnosis not present

## 2017-06-09 DIAGNOSIS — H353132 Nonexudative age-related macular degeneration, bilateral, intermediate dry stage: Secondary | ICD-10-CM | POA: Diagnosis not present

## 2017-06-09 DIAGNOSIS — H401113 Primary open-angle glaucoma, right eye, severe stage: Secondary | ICD-10-CM | POA: Diagnosis not present

## 2017-06-09 DIAGNOSIS — H401122 Primary open-angle glaucoma, left eye, moderate stage: Secondary | ICD-10-CM | POA: Diagnosis not present

## 2017-06-09 DIAGNOSIS — H34831 Tributary (branch) retinal vein occlusion, right eye, with macular edema: Secondary | ICD-10-CM | POA: Diagnosis not present

## 2017-06-16 DIAGNOSIS — Z7901 Long term (current) use of anticoagulants: Secondary | ICD-10-CM | POA: Diagnosis not present

## 2017-06-16 DIAGNOSIS — I4891 Unspecified atrial fibrillation: Secondary | ICD-10-CM | POA: Diagnosis not present

## 2017-06-22 DIAGNOSIS — H348312 Tributary (branch) retinal vein occlusion, right eye, stable: Secondary | ICD-10-CM | POA: Diagnosis not present

## 2017-06-22 DIAGNOSIS — H43812 Vitreous degeneration, left eye: Secondary | ICD-10-CM | POA: Diagnosis not present

## 2017-06-22 DIAGNOSIS — H353122 Nonexudative age-related macular degeneration, left eye, intermediate dry stage: Secondary | ICD-10-CM | POA: Diagnosis not present

## 2017-07-07 DIAGNOSIS — Z1231 Encounter for screening mammogram for malignant neoplasm of breast: Secondary | ICD-10-CM | POA: Diagnosis not present

## 2017-07-18 DIAGNOSIS — Z7901 Long term (current) use of anticoagulants: Secondary | ICD-10-CM | POA: Diagnosis not present

## 2017-07-18 DIAGNOSIS — I4891 Unspecified atrial fibrillation: Secondary | ICD-10-CM | POA: Diagnosis not present

## 2017-07-18 DIAGNOSIS — I10 Essential (primary) hypertension: Secondary | ICD-10-CM | POA: Diagnosis not present

## 2017-07-26 DIAGNOSIS — M810 Age-related osteoporosis without current pathological fracture: Secondary | ICD-10-CM | POA: Diagnosis not present

## 2017-07-26 DIAGNOSIS — E7800 Pure hypercholesterolemia, unspecified: Secondary | ICD-10-CM | POA: Diagnosis not present

## 2017-07-26 DIAGNOSIS — I4891 Unspecified atrial fibrillation: Secondary | ICD-10-CM | POA: Diagnosis not present

## 2017-07-26 DIAGNOSIS — I10 Essential (primary) hypertension: Secondary | ICD-10-CM | POA: Diagnosis not present

## 2017-07-28 DIAGNOSIS — I4891 Unspecified atrial fibrillation: Secondary | ICD-10-CM | POA: Diagnosis not present

## 2017-07-28 DIAGNOSIS — R739 Hyperglycemia, unspecified: Secondary | ICD-10-CM | POA: Diagnosis not present

## 2017-07-28 DIAGNOSIS — I10 Essential (primary) hypertension: Secondary | ICD-10-CM | POA: Diagnosis not present

## 2017-07-28 DIAGNOSIS — E7800 Pure hypercholesterolemia, unspecified: Secondary | ICD-10-CM | POA: Diagnosis not present

## 2017-08-02 DIAGNOSIS — Z23 Encounter for immunization: Secondary | ICD-10-CM | POA: Diagnosis not present

## 2017-08-04 DIAGNOSIS — H348312 Tributary (branch) retinal vein occlusion, right eye, stable: Secondary | ICD-10-CM | POA: Diagnosis not present

## 2017-08-04 DIAGNOSIS — H35351 Cystoid macular degeneration, right eye: Secondary | ICD-10-CM | POA: Diagnosis not present

## 2017-08-04 DIAGNOSIS — H353212 Exudative age-related macular degeneration, right eye, with inactive choroidal neovascularization: Secondary | ICD-10-CM | POA: Diagnosis not present

## 2017-08-04 DIAGNOSIS — H353122 Nonexudative age-related macular degeneration, left eye, intermediate dry stage: Secondary | ICD-10-CM | POA: Diagnosis not present

## 2017-08-16 DIAGNOSIS — Z7901 Long term (current) use of anticoagulants: Secondary | ICD-10-CM | POA: Diagnosis not present

## 2017-08-16 DIAGNOSIS — I4891 Unspecified atrial fibrillation: Secondary | ICD-10-CM | POA: Diagnosis not present

## 2017-08-31 DIAGNOSIS — Z7901 Long term (current) use of anticoagulants: Secondary | ICD-10-CM | POA: Diagnosis not present

## 2017-08-31 DIAGNOSIS — I4891 Unspecified atrial fibrillation: Secondary | ICD-10-CM | POA: Diagnosis not present

## 2017-10-06 DIAGNOSIS — Z7901 Long term (current) use of anticoagulants: Secondary | ICD-10-CM | POA: Diagnosis not present

## 2017-10-06 DIAGNOSIS — I4891 Unspecified atrial fibrillation: Secondary | ICD-10-CM | POA: Diagnosis not present

## 2017-11-03 DIAGNOSIS — Z7901 Long term (current) use of anticoagulants: Secondary | ICD-10-CM | POA: Diagnosis not present

## 2017-11-03 DIAGNOSIS — I4891 Unspecified atrial fibrillation: Secondary | ICD-10-CM | POA: Diagnosis not present

## 2017-11-10 DIAGNOSIS — H353122 Nonexudative age-related macular degeneration, left eye, intermediate dry stage: Secondary | ICD-10-CM | POA: Diagnosis not present

## 2017-11-10 DIAGNOSIS — H348312 Tributary (branch) retinal vein occlusion, right eye, stable: Secondary | ICD-10-CM | POA: Diagnosis not present

## 2017-11-10 DIAGNOSIS — H353212 Exudative age-related macular degeneration, right eye, with inactive choroidal neovascularization: Secondary | ICD-10-CM | POA: Diagnosis not present

## 2017-11-10 DIAGNOSIS — H3581 Retinal edema: Secondary | ICD-10-CM | POA: Diagnosis not present

## 2017-11-10 DIAGNOSIS — H35351 Cystoid macular degeneration, right eye: Secondary | ICD-10-CM | POA: Diagnosis not present

## 2017-12-01 DIAGNOSIS — Z7901 Long term (current) use of anticoagulants: Secondary | ICD-10-CM | POA: Diagnosis not present

## 2017-12-01 DIAGNOSIS — I4891 Unspecified atrial fibrillation: Secondary | ICD-10-CM | POA: Diagnosis not present

## 2017-12-08 DIAGNOSIS — H40051 Ocular hypertension, right eye: Secondary | ICD-10-CM | POA: Diagnosis not present

## 2017-12-08 DIAGNOSIS — H401113 Primary open-angle glaucoma, right eye, severe stage: Secondary | ICD-10-CM | POA: Diagnosis not present

## 2017-12-08 DIAGNOSIS — H401122 Primary open-angle glaucoma, left eye, moderate stage: Secondary | ICD-10-CM | POA: Diagnosis not present

## 2017-12-15 DIAGNOSIS — I4891 Unspecified atrial fibrillation: Secondary | ICD-10-CM | POA: Diagnosis not present

## 2017-12-15 DIAGNOSIS — Z7901 Long term (current) use of anticoagulants: Secondary | ICD-10-CM | POA: Diagnosis not present

## 2017-12-30 DIAGNOSIS — R0781 Pleurodynia: Secondary | ICD-10-CM | POA: Diagnosis not present

## 2017-12-30 DIAGNOSIS — K5900 Constipation, unspecified: Secondary | ICD-10-CM | POA: Diagnosis not present

## 2017-12-30 DIAGNOSIS — M25551 Pain in right hip: Secondary | ICD-10-CM | POA: Diagnosis not present

## 2017-12-30 DIAGNOSIS — S3992XA Unspecified injury of lower back, initial encounter: Secondary | ICD-10-CM | POA: Diagnosis not present

## 2017-12-30 DIAGNOSIS — W108XXA Fall (on) (from) other stairs and steps, initial encounter: Secondary | ICD-10-CM | POA: Diagnosis not present

## 2017-12-30 DIAGNOSIS — M546 Pain in thoracic spine: Secondary | ICD-10-CM | POA: Diagnosis not present

## 2017-12-30 DIAGNOSIS — S79911A Unspecified injury of right hip, initial encounter: Secondary | ICD-10-CM | POA: Diagnosis not present

## 2018-01-02 DIAGNOSIS — M4854XA Collapsed vertebra, not elsewhere classified, thoracic region, initial encounter for fracture: Secondary | ICD-10-CM | POA: Diagnosis not present

## 2018-01-02 DIAGNOSIS — M4856XA Collapsed vertebra, not elsewhere classified, lumbar region, initial encounter for fracture: Secondary | ICD-10-CM | POA: Diagnosis not present

## 2018-01-10 DIAGNOSIS — M546 Pain in thoracic spine: Secondary | ICD-10-CM | POA: Diagnosis not present

## 2018-01-10 DIAGNOSIS — M545 Low back pain: Secondary | ICD-10-CM | POA: Diagnosis not present

## 2018-01-13 DIAGNOSIS — I4891 Unspecified atrial fibrillation: Secondary | ICD-10-CM | POA: Diagnosis not present

## 2018-01-13 DIAGNOSIS — Z7901 Long term (current) use of anticoagulants: Secondary | ICD-10-CM | POA: Diagnosis not present

## 2018-01-13 DIAGNOSIS — M810 Age-related osteoporosis without current pathological fracture: Secondary | ICD-10-CM | POA: Diagnosis not present

## 2018-01-23 DIAGNOSIS — M4856XD Collapsed vertebra, not elsewhere classified, lumbar region, subsequent encounter for fracture with routine healing: Secondary | ICD-10-CM | POA: Diagnosis not present

## 2018-01-23 DIAGNOSIS — M4854XD Collapsed vertebra, not elsewhere classified, thoracic region, subsequent encounter for fracture with routine healing: Secondary | ICD-10-CM | POA: Diagnosis not present

## 2018-01-30 DIAGNOSIS — I4891 Unspecified atrial fibrillation: Secondary | ICD-10-CM | POA: Diagnosis not present

## 2018-01-30 DIAGNOSIS — R739 Hyperglycemia, unspecified: Secondary | ICD-10-CM | POA: Diagnosis not present

## 2018-01-30 DIAGNOSIS — I10 Essential (primary) hypertension: Secondary | ICD-10-CM | POA: Diagnosis not present

## 2018-02-06 ENCOUNTER — Telehealth: Payer: Self-pay | Admitting: Gastroenterology

## 2018-02-06 ENCOUNTER — Other Ambulatory Visit: Payer: Self-pay | Admitting: Internal Medicine

## 2018-02-06 DIAGNOSIS — Z0000 Encounter for general adult medical examination without abnormal findings: Secondary | ICD-10-CM | POA: Diagnosis not present

## 2018-02-06 DIAGNOSIS — E7800 Pure hypercholesterolemia, unspecified: Secondary | ICD-10-CM | POA: Diagnosis not present

## 2018-02-06 DIAGNOSIS — I10 Essential (primary) hypertension: Secondary | ICD-10-CM | POA: Diagnosis not present

## 2018-02-06 DIAGNOSIS — I4891 Unspecified atrial fibrillation: Secondary | ICD-10-CM | POA: Diagnosis not present

## 2018-02-06 DIAGNOSIS — R4781 Slurred speech: Secondary | ICD-10-CM | POA: Diagnosis not present

## 2018-02-06 DIAGNOSIS — R739 Hyperglycemia, unspecified: Secondary | ICD-10-CM | POA: Diagnosis not present

## 2018-02-06 NOTE — Telephone Encounter (Signed)
A user error has taken place: ERROR °

## 2018-02-07 ENCOUNTER — Ambulatory Visit
Admission: RE | Admit: 2018-02-07 | Discharge: 2018-02-07 | Disposition: A | Discharge status: Home / Self Care | Payer: Medicare Other | Source: Ambulatory Visit | Attending: Internal Medicine | Admitting: Internal Medicine

## 2018-02-07 DIAGNOSIS — R4781 Slurred speech: Secondary | ICD-10-CM

## 2018-02-09 DIAGNOSIS — Z7901 Long term (current) use of anticoagulants: Secondary | ICD-10-CM | POA: Diagnosis not present

## 2018-02-09 DIAGNOSIS — I4891 Unspecified atrial fibrillation: Secondary | ICD-10-CM | POA: Diagnosis not present

## 2018-02-22 ENCOUNTER — Encounter: Payer: Self-pay | Admitting: Physician Assistant

## 2018-02-22 ENCOUNTER — Ambulatory Visit: Payer: Medicare Other | Admitting: Physician Assistant

## 2018-02-22 DIAGNOSIS — K862 Cyst of pancreas: Secondary | ICD-10-CM | POA: Diagnosis not present

## 2018-02-22 NOTE — Patient Instructions (Signed)
You have been scheduled for an MRI at [facility], located at [address] . Your appointment is scheduled for 02-28-18 at 4:40 pm. Please arrive 30 minutes prior to your appointment time for registration purposes. Do not wear any metal. Please make certain not to have anything to eat or drink 4 hours prior to your test. In addition, if you have any metal in your body, have a pacemaker or defibrillator, please be sure to let your ordering physician know. This test typically takes 1 hour to complete. Should you need to reschedule, please call [phone] to do so.

## 2018-02-22 NOTE — Progress Notes (Signed)
Chief Complaint: Abnormal imaging of the pancreas  HPI:    Kelli Peterson is an 84 year old female, known to Dr. Fuller Plan, with a past medical history as listed below including A. fib, DVT, CHF and COPD on Coumadin, as well as others listed below, who was referred to me by Jani Gravel, MD for a complaint of abnormal imaging of her pancreas.      MRI of thoracic spine 01/10/2018 revealed a hyperintense lesion within the pancreatic tail partially seen measuring up to 1.3 cm and could represent pancreatic pseudocyst versus pancreatic cystic neoplasm.    Today, presents to clinic accompanied by her daughter and explains that she recently had MRI imaging of her back after a fall and they found a "cyst" on my pancreas.  The patient describes that she has no complaints today, in fact she feels fairly well.  She does discuss that she would not want anything too invasive done to figure out what is going on.    Denies fever, chills, weight loss, change in bowel habits, nausea, vomiting, heartburn, reflux or symptoms that awaken her at night.  Past Medical History:  Diagnosis Date  . Adenomatous colon polyp 2007  . Atrial fibrillation ([place])   . Barrett's esophagus 2007  . Cerebellar hemorrhage ([place])   . Cervical cancer ([place])   . CHF (congestive heart failure) ([place])   . Closed pelvic fracture ([place])   . CVD (cardiovascular disease)   . DVT (deep venous thrombosis) ([place])   . Fibrocystic breast disease   . Fracture of pubic ramus ([place])   . Hyperlipidemia   . Hypertension   . Iron deficiency anemia     Past Surgical History:  Procedure Laterality Date  . CATARACT EXTRACTION, BILATERAL  2010  . TOTAL ABDOMINAL HYSTERECTOMY  1964    Current Outpatient Medications  Medication Sig Dispense Refill  . acetaminophen (TYLENOL) 500 MG tablet Take 500 mg by mouth every 6 (six) hours as needed for pain.    Marland Kitchen alendronate (FOSAMAX) 70 MG tablet Take 70 mg by mouth every Wednesday. Take with a full glass of water  on an empty stomach.    Marland Kitchen amLODipine (NORVASC) 5 MG tablet Take 5 mg by mouth daily.    Marland Kitchen atorvastatin (LIPITOR) 20 MG tablet Take 20 mg by mouth daily.    . calcium carbonate (CALCIUM 600) 600 MG TABS tablet Take 600 mg by mouth daily with breakfast.    . digoxin (LANOXIN) 0.25 MG tablet Take 0.125 mg by mouth daily.     Marland Kitchen diltiazem (CARDIZEM CD) 300 MG 24 hr capsule Take 300 mg by mouth daily.    . dorzolamide (TRUSOPT) 2 % ophthalmic solution Place 1 drop into both eyes 2 (two) times daily.     . furosemide (LASIX) 20 MG tablet Take 20 mg by mouth daily.     Marland Kitchen latanoprost (XALATAN) 0.005 % ophthalmic solution Place 1 drop into both eyes at bedtime.     Marland Kitchen lisinopril (PRINIVIL,ZESTRIL) 20 MG tablet Take 30 mg by mouth daily.     . Multiple Vitamin (MULTIVITAMIN WITH MINERALS) TABS tablet Take 1 tablet by mouth daily.    . Omega-3 Fatty Acids (FISH OIL PO) Take 1 capsule by mouth daily.    Marland Kitchen warfarin (COUMADIN) 2 MG tablet Take 2 mg by mouth. On Sunday, Monday, Wednesday, and Friday    . warfarin (COUMADIN) 3 MG tablet Take 3 mg by mouth daily. Tues, Thurs, Saturday     No current facility-administered  medications for this visit.     Allergies as of 02/22/2018 - Review Complete 02/22/2018  Allergen Reaction Noted  . Penicillins Other (See Comments) 02/15/2016  . Zocor [simvastatin] Other (See Comments) 12/18/2012    Family History  Problem Relation Age of Onset  . CVA Father   . CVA Mother     Social History   Socioeconomic History  . Marital status: Widowed    Spouse name: Not on file  . Number of children: Not on file  . Years of education: Not on file  . Highest education level: Not on file  Occupational History  . Occupation: Retired  Scientific laboratory technician  . Financial resource strain: Not on file  . Food insecurity:    Worry: Not on file    Inability: Not on file  . Transportation needs:    Medical: Not on file    Non-medical: Not on file  Tobacco Use  . Smoking status:  Never Smoker  . Smokeless tobacco: Never Used  Substance and Sexual Activity  . Alcohol use: No  . Drug use: No  . Sexual activity: Not on file  Lifestyle  . Physical activity:    Days per week: Not on file    Minutes per session: Not on file  . Stress: Not on file  Relationships  . Social connections:    Talks on phone: Not on file    Gets together: Not on file    Attends religious service: Not on file    Active member of club or organization: Not on file    Attends meetings of clubs or organizations: Not on file    Relationship status: Not on file  . Intimate partner violence:    Fear of current or ex partner: Not on file    Emotionally abused: Not on file    Physically abused: Not on file    Forced sexual activity: Not on file  Other Topics Concern  . Not on file  Social History Narrative  . Not on file    Review of Systems:    Constitutional: No weight loss, fever or chills Skin: No rash Cardiovascular: No chest pain Respiratory: No SOB Gastrointestinal: See HPI and otherwise negative Genitourinary: No dysuria Neurological: + positional dizziness Musculoskeletal: No new muscle or joint pain Hematologic: No bleeding  Psychiatric: No history of depression or anxiety   Physical Exam:  Vital signs: BP 132/66   Pulse 68   Ht 4\' 11"  (1.499 m)   Wt 107 lb (48.5 kg)   BMI 21.61 kg/m   Constitutional:   Pleasant Elderly, frail-appearing, Caucasian female appears to be in NAD, Well developed, Well nourished, alert and cooperative Head:  Normocephalic and atraumatic. Eyes:   PEERL, EOMI. No icterus. Conjunctiva pink. Ears:  Normal auditory acuity. Neck:  Supple Throat: Oral cavity and pharynx without inflammation, swelling or lesion.  Respiratory: Respirations even and unlabored. Lungs clear to auscultation bilaterally.   No wheezes, crackles, or rhonchi.  Cardiovascular: Normal S1, S2. No MRG. Regular rate and rhythm. No peripheral edema, cyanosis or pallor.    Gastrointestinal:  Soft, nondistended, nontender. No rebound or guarding. Normal bowel sounds. No appreciable masses or hepatomegaly. Rectal:  Not performed.  Msk:  Symmetrical without gross deformities. Without edema, no deformity or joint abnormality. Uses walker to ambulate Neurologic:  Alert and  oriented x4;  grossly normal neurologically.  Skin:   Dry and intact without significant lesions or rashes. Psychiatric:  Demonstrates good judgement and reason without abnormal  affect or behaviors.  See HPI for recent imaging.  Assessment: 1.  Abnormal imaging of the pancreas: 1.3 cm cyst seen in the tail of the pancreas on MRI of the thoracic spine, asymptomatic; most likely simple cyst versus less likely cystic neoplasm  Plan: 1.  Discussed options with the patient.  Would recommend an MRI with pancreatic protocol as a first step to better decipher what is going on.  The patient is in agreement with this.  She requests this to be done at [facility]. 2.  Did discuss next steps with the patient possibly including an EUS with FNA if the cyst looks suspicious.  The patient is not sure that she would want to have this done.  Explained that we can decide this for sure after imaging above. 3.  Did briefly discussed the case with Dr. Ardis Hughs who agreed with MRI 4.  Patient to follow in clinic per recommendations after imaging above.  Ellouise Newer, PA-C [facility] 02/22/2018, 9:01 AM  Cc: Jani Gravel, MD

## 2018-02-22 NOTE — Progress Notes (Signed)
Reviewed and agree with initial management plan including pancreatic protocol MRI.  Pricilla Riffle. Fuller Plan, MD [facility]

## 2018-02-28 ENCOUNTER — Ambulatory Visit
Admission: RE | Admit: 2018-02-28 | Discharge: 2018-02-28 | Disposition: A | Discharge status: Home / Self Care | Payer: Medicare Other | Source: Ambulatory Visit | Attending: Physician Assistant | Admitting: Physician Assistant

## 2018-02-28 DIAGNOSIS — K862 Cyst of pancreas: Secondary | ICD-10-CM

## 2018-02-28 MED ORDER — GADOBENATE DIMEGLUMINE 529 MG/ML IV SOLN
9.0000 mL | Freq: Once | INTRAVENOUS | Status: AC | PRN
  Administered 2018-02-28: 9 mL via INTRAVENOUS

## 2018-03-06 ENCOUNTER — Other Ambulatory Visit: Payer: Self-pay | Admitting: Internal Medicine

## 2018-03-06 DIAGNOSIS — I10 Essential (primary) hypertension: Secondary | ICD-10-CM | POA: Diagnosis not present

## 2018-03-06 DIAGNOSIS — I4891 Unspecified atrial fibrillation: Secondary | ICD-10-CM | POA: Diagnosis not present

## 2018-03-06 DIAGNOSIS — M810 Age-related osteoporosis without current pathological fracture: Secondary | ICD-10-CM | POA: Diagnosis not present

## 2018-03-06 DIAGNOSIS — K869 Disease of pancreas, unspecified: Secondary | ICD-10-CM

## 2018-03-09 DIAGNOSIS — Z7901 Long term (current) use of anticoagulants: Secondary | ICD-10-CM | POA: Diagnosis not present

## 2018-03-09 DIAGNOSIS — I4891 Unspecified atrial fibrillation: Secondary | ICD-10-CM | POA: Diagnosis not present

## 2018-03-13 ENCOUNTER — Ambulatory Visit
Admission: RE | Admit: 2018-03-13 | Discharge: 2018-03-13 | Disposition: A | Discharge status: Home / Self Care | Payer: Medicare Other | Source: Ambulatory Visit | Attending: Internal Medicine | Admitting: Internal Medicine

## 2018-03-13 DIAGNOSIS — H348312 Tributary (branch) retinal vein occlusion, right eye, stable: Secondary | ICD-10-CM | POA: Diagnosis not present

## 2018-03-13 DIAGNOSIS — H353122 Nonexudative age-related macular degeneration, left eye, intermediate dry stage: Secondary | ICD-10-CM | POA: Diagnosis not present

## 2018-03-13 DIAGNOSIS — M25551 Pain in right hip: Secondary | ICD-10-CM | POA: Diagnosis not present

## 2018-03-13 DIAGNOSIS — K869 Disease of pancreas, unspecified: Secondary | ICD-10-CM

## 2018-03-13 DIAGNOSIS — H353212 Exudative age-related macular degeneration, right eye, with inactive choroidal neovascularization: Secondary | ICD-10-CM | POA: Diagnosis not present

## 2018-03-13 DIAGNOSIS — H35351 Cystoid macular degeneration, right eye: Secondary | ICD-10-CM | POA: Diagnosis not present

## 2018-03-13 MED ORDER — GADOBENATE DIMEGLUMINE 529 MG/ML IV SOLN
9.0000 mL | Freq: Once | INTRAVENOUS | Status: AC | PRN
  Administered 2018-03-13: 9 mL via INTRAVENOUS

## 2018-04-13 DIAGNOSIS — I4891 Unspecified atrial fibrillation: Secondary | ICD-10-CM | POA: Diagnosis not present

## 2018-04-13 DIAGNOSIS — Z7901 Long term (current) use of anticoagulants: Secondary | ICD-10-CM | POA: Diagnosis not present

## 2018-05-11 DIAGNOSIS — Z7901 Long term (current) use of anticoagulants: Secondary | ICD-10-CM | POA: Diagnosis not present

## 2018-05-11 DIAGNOSIS — I4891 Unspecified atrial fibrillation: Secondary | ICD-10-CM | POA: Diagnosis not present

## 2018-05-30 DIAGNOSIS — Z7901 Long term (current) use of anticoagulants: Secondary | ICD-10-CM | POA: Diagnosis not present

## 2018-05-30 DIAGNOSIS — R197 Diarrhea, unspecified: Secondary | ICD-10-CM | POA: Diagnosis not present

## 2018-05-30 DIAGNOSIS — I4891 Unspecified atrial fibrillation: Secondary | ICD-10-CM | POA: Diagnosis not present

## 2018-05-30 DIAGNOSIS — N390 Urinary tract infection, site not specified: Secondary | ICD-10-CM | POA: Diagnosis not present

## 2018-05-30 DIAGNOSIS — R531 Weakness: Secondary | ICD-10-CM | POA: Diagnosis not present

## 2018-05-30 DIAGNOSIS — R110 Nausea: Secondary | ICD-10-CM | POA: Diagnosis not present

## 2018-05-31 ENCOUNTER — Encounter (HOSPITAL_COMMUNITY): Payer: Self-pay | Admitting: Emergency Medicine

## 2018-05-31 ENCOUNTER — Inpatient Hospital Stay (HOSPITAL_COMMUNITY)
Admission: EM | Admit: 2018-05-31 | Discharge: 2018-06-06 | DRG: 682 | Disposition: A | Discharge status: Skilled Nursing Facility | Payer: Medicare Other | Attending: Internal Medicine | Admitting: Internal Medicine

## 2018-05-31 ENCOUNTER — Emergency Department (HOSPITAL_COMMUNITY): Payer: Medicare Other

## 2018-05-31 ENCOUNTER — Other Ambulatory Visit: Payer: Self-pay

## 2018-05-31 DIAGNOSIS — R498 Other voice and resonance disorders: Secondary | ICD-10-CM | POA: Diagnosis not present

## 2018-05-31 DIAGNOSIS — E0781 Sick-euthyroid syndrome: Secondary | ICD-10-CM | POA: Diagnosis present

## 2018-05-31 DIAGNOSIS — Z7983 Long term (current) use of bisphosphonates: Secondary | ICD-10-CM | POA: Diagnosis not present

## 2018-05-31 DIAGNOSIS — K2270 Barrett's esophagus without dysplasia: Secondary | ICD-10-CM | POA: Diagnosis present

## 2018-05-31 DIAGNOSIS — E785 Hyperlipidemia, unspecified: Secondary | ICD-10-CM | POA: Diagnosis not present

## 2018-05-31 DIAGNOSIS — E43 Unspecified severe protein-calorie malnutrition: Secondary | ICD-10-CM

## 2018-05-31 DIAGNOSIS — R627 Adult failure to thrive: Secondary | ICD-10-CM | POA: Diagnosis present

## 2018-05-31 DIAGNOSIS — E860 Dehydration: Secondary | ICD-10-CM

## 2018-05-31 DIAGNOSIS — M6281 Muscle weakness (generalized): Secondary | ICD-10-CM | POA: Diagnosis not present

## 2018-05-31 DIAGNOSIS — E872 Acidosis: Secondary | ICD-10-CM | POA: Diagnosis not present

## 2018-05-31 DIAGNOSIS — N179 Acute kidney failure, unspecified: Secondary | ICD-10-CM | POA: Diagnosis not present

## 2018-05-31 DIAGNOSIS — K5900 Constipation, unspecified: Secondary | ICD-10-CM | POA: Diagnosis not present

## 2018-05-31 DIAGNOSIS — Z681 Body mass index (BMI) 19 or less, adult: Secondary | ICD-10-CM

## 2018-05-31 DIAGNOSIS — I482 Chronic atrial fibrillation, unspecified: Secondary | ICD-10-CM | POA: Diagnosis present

## 2018-05-31 DIAGNOSIS — R279 Unspecified lack of coordination: Secondary | ICD-10-CM | POA: Diagnosis not present

## 2018-05-31 DIAGNOSIS — Z86010 Personal history of colonic polyps: Secondary | ICD-10-CM | POA: Diagnosis not present

## 2018-05-31 DIAGNOSIS — N390 Urinary tract infection, site not specified: Secondary | ICD-10-CM | POA: Diagnosis not present

## 2018-05-31 DIAGNOSIS — I130 Hypertensive heart and chronic kidney disease with heart failure and stage 1 through stage 4 chronic kidney disease, or unspecified chronic kidney disease: Secondary | ICD-10-CM | POA: Diagnosis present

## 2018-05-31 DIAGNOSIS — Z888 Allergy status to other drugs, medicaments and biological substances status: Secondary | ICD-10-CM

## 2018-05-31 DIAGNOSIS — I480 Paroxysmal atrial fibrillation: Secondary | ICD-10-CM | POA: Diagnosis not present

## 2018-05-31 DIAGNOSIS — Z743 Need for continuous supervision: Secondary | ICD-10-CM | POA: Diagnosis not present

## 2018-05-31 DIAGNOSIS — I10 Essential (primary) hypertension: Secondary | ICD-10-CM

## 2018-05-31 DIAGNOSIS — I2510 Atherosclerotic heart disease of native coronary artery without angina pectoris: Secondary | ICD-10-CM | POA: Diagnosis present

## 2018-05-31 DIAGNOSIS — Z79899 Other long term (current) drug therapy: Secondary | ICD-10-CM

## 2018-05-31 DIAGNOSIS — N183 Chronic kidney disease, stage 3 (moderate): Secondary | ICD-10-CM | POA: Diagnosis not present

## 2018-05-31 DIAGNOSIS — Z8541 Personal history of malignant neoplasm of cervix uteri: Secondary | ICD-10-CM

## 2018-05-31 DIAGNOSIS — Z880 Allergy status to penicillin: Secondary | ICD-10-CM

## 2018-05-31 DIAGNOSIS — I4891 Unspecified atrial fibrillation: Secondary | ICD-10-CM | POA: Diagnosis not present

## 2018-05-31 DIAGNOSIS — R488 Other symbolic dysfunctions: Secondary | ICD-10-CM | POA: Diagnosis not present

## 2018-05-31 DIAGNOSIS — K8689 Other specified diseases of pancreas: Secondary | ICD-10-CM | POA: Diagnosis not present

## 2018-05-31 DIAGNOSIS — I5032 Chronic diastolic (congestive) heart failure: Secondary | ICD-10-CM | POA: Diagnosis not present

## 2018-05-31 DIAGNOSIS — J449 Chronic obstructive pulmonary disease, unspecified: Secondary | ICD-10-CM | POA: Diagnosis not present

## 2018-05-31 DIAGNOSIS — R531 Weakness: Secondary | ICD-10-CM

## 2018-05-31 DIAGNOSIS — K8020 Calculus of gallbladder without cholecystitis without obstruction: Secondary | ICD-10-CM | POA: Diagnosis not present

## 2018-05-31 DIAGNOSIS — H409 Unspecified glaucoma: Secondary | ICD-10-CM

## 2018-05-31 DIAGNOSIS — Z7901 Long term (current) use of anticoagulants: Secondary | ICD-10-CM | POA: Diagnosis not present

## 2018-05-31 DIAGNOSIS — R2689 Other abnormalities of gait and mobility: Secondary | ICD-10-CM | POA: Diagnosis not present

## 2018-05-31 LAB — CBC
HCT: 40.2 % (ref 36.0–46.0)
Hemoglobin: 13.7 g/dL (ref 12.0–15.0)
MCH: 30.2 pg (ref 26.0–34.0)
MCHC: 34.1 g/dL (ref 30.0–36.0)
MCV: 88.5 fL (ref 78.0–100.0)
Platelets: 423 10*3/uL — ABNORMAL HIGH (ref 150–400)
RBC: 4.54 MIL/uL (ref 3.87–5.11)
RDW: 14 % (ref 11.5–15.5)
WBC: 15 10*3/uL — ABNORMAL HIGH (ref 4.0–10.5)

## 2018-05-31 LAB — HEPATIC FUNCTION PANEL
ALT: 16 U/L (ref 0–44)
AST: 10 U/L — ABNORMAL LOW (ref 15–41)
Albumin: 3 g/dL — ABNORMAL LOW (ref 3.5–5.0)
Alkaline Phosphatase: 73 U/L (ref 38–126)
Bilirubin, Direct: 0.1 mg/dL (ref 0.0–0.2)
Indirect Bilirubin: 0.8 mg/dL (ref 0.3–0.9)
Total Bilirubin: 0.9 mg/dL (ref 0.3–1.2)
Total Protein: 7.7 g/dL (ref 6.5–8.1)

## 2018-05-31 LAB — BASIC METABOLIC PANEL
Anion gap: 18 — ABNORMAL HIGH (ref 5–15)
BUN: 155 mg/dL — ABNORMAL HIGH (ref 8–23)
CO2: 17 mmol/L — ABNORMAL LOW (ref 22–32)
Calcium: 8.6 mg/dL — ABNORMAL LOW (ref 8.9–10.3)
Chloride: 102 mmol/L (ref 98–111)
Creatinine, Ser: 9.67 mg/dL — ABNORMAL HIGH (ref 0.44–1.00)
GFR calc Af Amer: 4 mL/min — ABNORMAL LOW (ref >60)
GFR calc non Af Amer: 3 mL/min — ABNORMAL LOW (ref >60)
Glucose, Bld: 191 mg/dL — ABNORMAL HIGH (ref 70–99)
Potassium: 4.9 mmol/L (ref 3.5–5.1)
Sodium: 137 mmol/L (ref 135–145)

## 2018-05-31 LAB — URINALYSIS, ROUTINE W REFLEX MICROSCOPIC
Bilirubin Urine: NEGATIVE
Glucose, UA: NEGATIVE mg/dL
Hgb urine dipstick: NEGATIVE
Ketones, ur: NEGATIVE mg/dL
Leukocytes, UA: NEGATIVE
Nitrite: NEGATIVE
Protein, ur: NEGATIVE mg/dL
Specific Gravity, Urine: 1.01 (ref 1.005–1.030)
pH: 5 (ref 5.0–8.0)

## 2018-05-31 LAB — PROTIME-INR
INR: 3.13
Prothrombin Time: 31.9 seconds — ABNORMAL HIGH (ref 11.4–15.2)

## 2018-05-31 LAB — I-STAT VENOUS BLOOD GAS, ED
Acid-base deficit: 12 mmol/L — ABNORMAL HIGH (ref 0.0–2.0)
Bicarbonate: 14.1 mmol/L — ABNORMAL LOW (ref 20.0–28.0)
O2 Saturation: 73 %
TCO2: 15 mmol/L — ABNORMAL LOW (ref 22–32)
pCO2, Ven: 30.7 mmHg — ABNORMAL LOW (ref 44.0–60.0)
pH, Ven: 7.269 (ref 7.250–7.430)
pO2, Ven: 43 mmHg (ref 32.0–45.0)

## 2018-05-31 LAB — PHOSPHORUS: Phosphorus: 6.5 mg/dL — ABNORMAL HIGH (ref 2.5–4.6)

## 2018-05-31 LAB — MAGNESIUM: Magnesium: 2.7 mg/dL — ABNORMAL HIGH (ref 1.7–2.4)

## 2018-05-31 LAB — DIGOXIN LEVEL: Digoxin Level: 3.2 ng/mL (ref 0.8–2.0)

## 2018-05-31 LAB — I-STAT TROPONIN, ED: Troponin i, poc: 0.05 ng/mL (ref 0.00–0.08)

## 2018-05-31 MED ORDER — BRIMONIDINE TARTRATE 0.2 % OP SOLN
1.0000 [drp] | Freq: Two times a day (BID) | OPHTHALMIC | Status: DC
  Administered 2018-06-01 – 2018-06-06 (×12): 1 [drp] via OPHTHALMIC
  Filled 2018-05-31: qty 5

## 2018-05-31 MED ORDER — BISACODYL 10 MG RE SUPP
10.0000 mg | Freq: Every day | RECTAL | Status: DC | PRN
  Administered 2018-06-04: 10 mg via RECTAL
  Filled 2018-05-31: qty 1

## 2018-05-31 MED ORDER — LATANOPROST 0.005 % OP SOLN
1.0000 [drp] | Freq: Every day | OPHTHALMIC | Status: DC
  Administered 2018-05-31 – 2018-06-06 (×7): 1 [drp] via OPHTHALMIC
  Filled 2018-05-31: qty 2.5

## 2018-05-31 MED ORDER — ONDANSETRON HCL 4 MG PO TABS: 4.0000 mg | ORAL_TABLET | Freq: Four times a day (QID) | ORAL | Status: DC | PRN

## 2018-05-31 MED ORDER — ONDANSETRON HCL 4 MG/2ML IJ SOLN
4.0000 mg | Freq: Four times a day (QID) | INTRAMUSCULAR | Status: DC | PRN
  Administered 2018-05-31 – 2018-06-03 (×5): 4 mg via INTRAVENOUS
  Filled 2018-05-31 (×5): qty 2

## 2018-05-31 MED ORDER — ACETAMINOPHEN 650 MG RE SUPP: 650.0000 mg | Freq: Four times a day (QID) | RECTAL | Status: DC | PRN

## 2018-05-31 MED ORDER — DORZOLAMIDE HCL 2 % OP SOLN
1.0000 [drp] | Freq: Two times a day (BID) | OPHTHALMIC | Status: DC
  Administered 2018-06-01 – 2018-06-06 (×12): 1 [drp] via OPHTHALMIC
  Filled 2018-05-31: qty 10

## 2018-05-31 MED ORDER — SODIUM CHLORIDE 0.9 % IV BOLUS
1000.0000 mL | Freq: Once | INTRAVENOUS | Status: AC
  Administered 2018-05-31: 1000 mL via INTRAVENOUS

## 2018-05-31 MED ORDER — SENNA 8.6 MG PO TABS
1.0000 | ORAL_TABLET | Freq: Two times a day (BID) | ORAL | Status: DC
  Administered 2018-06-01 – 2018-06-06 (×12): 8.6 mg via ORAL
  Filled 2018-05-31 (×12): qty 1

## 2018-05-31 MED ORDER — SODIUM CHLORIDE 0.9 % IV SOLN
INTRAVENOUS | Status: AC
  Administered 2018-05-31 – 2018-06-01 (×2): via INTRAVENOUS

## 2018-05-31 MED ORDER — DILTIAZEM HCL ER COATED BEADS 120 MG PO CP24
120.0000 mg | ORAL_CAPSULE | Freq: Every day | ORAL | Status: DC
  Administered 2018-06-01 – 2018-06-04 (×4): 120 mg via ORAL
  Filled 2018-05-31 (×5): qty 1

## 2018-05-31 MED ORDER — ATORVASTATIN CALCIUM 20 MG PO TABS
20.0000 mg | ORAL_TABLET | Freq: Every day | ORAL | Status: DC
  Administered 2018-06-01 – 2018-06-06 (×6): 20 mg via ORAL
  Filled 2018-05-31 (×6): qty 1

## 2018-05-31 MED ORDER — ACETAMINOPHEN 325 MG PO TABS
650.0000 mg | ORAL_TABLET | Freq: Four times a day (QID) | ORAL | Status: DC | PRN
  Filled 2018-05-31: qty 2

## 2018-05-31 MED ORDER — POLYETHYLENE GLYCOL 3350 17 G PO PACK
17.0000 g | PACK | Freq: Every day | ORAL | Status: DC | PRN
  Administered 2018-06-04: 17 g via ORAL
  Filled 2018-05-31: qty 1

## 2018-05-31 NOTE — Progress Notes (Signed)
Report received. Room ready.  

## 2018-05-31 NOTE — ED Triage Notes (Addendum)
Daughter reports pt has had generalized weakness x 1 1/2 weeks and not feeling well.  Pt denies pain.  Labs done at [place] office yesterday and started on antibiotics for ? Infection.  Dr's office called today and sent pt to ED due to abnormal kidney function.  Denies fever.  Pt alert and oriented.

## 2018-05-31 NOTE — H&P (Signed)
BARBI KUMAGAI LJQ:[id number] DOB: 1930-05-30 DOA: 05/31/2018     PCP: Jani Gravel, MD   Outpatient Specialists:    Fabienne Bruns [facility] Dr. Fuller Plan Patient arrived to ER on 05/31/18 at 1507  Patient coming from:  home Lives  With family    Chief Complaint:  Chief Complaint  Patient presents with  . Weakness  . abnormal labs    HPI: Kelli Peterson is a 84 y.o. female with medical history significant of A. fib on Coumadin,  HLD, chronic diastolic CHF  Presented with generalized fatigue decreased p.o. intake for the past 2 weeks.  Patient initially had gastroenteritis 2 weeks ago had profuse diarrhea for few days currently diarrhea has resolved patient continues to not eat or drink appropriately.  She went to her primary care provider yesterday and was diagnosed with possible UTI and started on Keflex they checked her blood work and noted today that her creatinine was elevated so they sent her to emergency department. Otherwise she denies any fevers or chills. Decreased urination,  Stools have been looking dark she is on iron pills no blood in stool or urine. No dysuria She has been lethargic for the past 1 week Pt mentioned she had some chest discomfort 5 days ago but not currently.   Her INR was 4 yesterday and she was told not to take it today. Reports slight nausea no vomiting. Diarrhea have resolved 2 weeks ago last BM was yesterday  Regarding pertinent Chronic problems: A.fib on digoxin, Cardizem, anticoagulated with coumadin.   Hx of diastolic CHF on Lasix and Lisinopril While in ER: Noted to be in severe acute renal failure creatinine 9.67 BUN 155 potassium 4.9 Physical exam significant for significant dehydration  The following Work up has been ordered so far:  Orders Placed This Encounter  Procedures  . Urine culture  . CT Renal Stone Study  . Basic metabolic panel  . CBC  . Urinalysis, Routine w reflex microscopic  . Protime-INR  . Cardiac monitoring  . Saline Lock IV,  Maintain IV access  . Consult to nephrology  . Consult to hospitalist  . Pulse oximetry, continuous  . I-stat troponin, ED  . ED EKG      Following Medications were ordered in ER: Medications  sodium chloride 0.9 % bolus 1,000 mL (1,000 mLs Intravenous New Bag/Given 05/31/18 1817)  sodium chloride 0.9 % bolus 1,000 mL (0 mLs Intravenous Stopped 05/31/18 1820)    Significant initial  Findings: Abnormal Labs Reviewed  BASIC METABOLIC PANEL - Abnormal; Notable for the following components:      Result Value   CO2 17 (*)    Glucose, Bld 191 (*)    BUN 155 (*)    Creatinine, Ser 9.67 (*)    Calcium 8.6 (*)    GFR calc non Af Amer 3 (*)    GFR calc Af Amer 4 (*)    Anion gap 18 (*)    All other components within normal limits  CBC - Abnormal; Notable for the following components:   WBC 15.0 (*)    Platelets 423 (*)    All other components within normal limits  PROTIME-INR - Abnormal; Notable for the following components:   Prothrombin Time 31.9 (*)    All other components within normal limits     Na  137 K 4.9  Cr    Up from baseline see below Lab Results  Component Value Date   CREATININE 9.67 (H) 05/31/2018   CREATININE  0.6 02/20/2016   CREATININE 0.70 02/18/2016      WBC 15  HG/HCT stable,      Component Value Date/Time   HGB 13.7 05/31/2018 1527   HCT 40.2 05/31/2018 1527       Troponin (Point of Care Test) Recent Labs    05/31/18 1652  TROPIPOC 0.05       BNP (last 3 results) No results for input(s): BNP in the last 8760 hours.  ProBNP (last 3 results) No results for input(s): PROBNP in the last 8760 hours.  Lactic Acid, Venous No results found for: LATICACIDVEN    UA ordered    ECG:  Personally reviewed by me showing: HR : 58 Rhythm:  A.fib.  with slow ventricular response   no evidence of ischemic changes QTC 359     ED Triage Vitals  Enc Vitals Group     BP 05/31/18 1517 126/66     Pulse Rate 05/31/18 1517 (!) 57      Resp 05/31/18 1517 14     Temp 05/31/18 1517 (!) 97.5 F (36.4 C)     Temp Source 05/31/18 1517 Oral     SpO2 05/31/18 1517 100 %     Weight 05/31/18 1522 102 lb (46.3 kg)     Height 05/31/18 1522 5' (1.524 m)     Head Circumference --      Peak Flow --      Pain Score 05/31/18 1522 0     Pain Loc --      Pain Edu? --      Excl. in [place]? --   TMAX(24)@       Latest  Blood pressure (!) 146/72, pulse 79, temperature (!) 97.5 F (36.4 C), temperature source Oral, resp. rate 12, height 5' (1.524 m), weight 46.3 kg, SpO2 99 %.    ER Provider Called: nephrology   Dr. Augustin Coupe  They Recommend admit and rehydrate follow kidney function they will follow together with [place] Will see in AM   Hospitalist was called for admission for severe acute renal failure secondary to dehydration   Review of Systems:    Pertinent positives include: fatigue, weight loss  chest pain, dark stools Constitutional:  No weight loss, night sweats, Fevers, chills,  HEENT:  No headaches, Difficulty swallowing,Tooth/dental problems,Sore throat,  No sneezing, itching, ear ache, nasal congestion, post nasal drip,  Cardio-vascular:  No  Orthopnea, PND, anasarca, dizziness, palpitations.no Bilateral lower extremity swelling  GI:  No heartburn, indigestion, abdominal pain, nausea, vomiting, diarrhea, change in bowel habits, loss of appetite, melena, blood in stool, hematemesis Resp:  no shortness of breath at rest. No dyspnea on exertion, No excess mucus, no productive cough, No non-productive cough, No coughing up of blood.No change in color of mucus.No wheezing. Skin:  no rash or lesions. No jaundice GU:  no dysuria, change in color of urine, no urgency or frequency. No straining to urinate.  No flank pain.  Musculoskeletal:  No joint pain or no joint swelling. No decreased range of motion. No back pain.  Psych:  No change in mood or affect. No depression or anxiety. No memory loss.  Neuro: no localizing  neurological complaints, no tingling, no weakness, no double vision, no gait abnormality, no slurred speech, no confusion  All systems reviewed and apart from [place] all are negative  Past Medical History:   Past Medical History:  Diagnosis Date  . Adenomatous colon polyp 2007  . Atrial fibrillation ([place])   . Barrett's esophagus  2007  . Cerebellar hemorrhage ([place])   . Cervical cancer ([place])   . CHF (congestive heart failure) ([place])   . Closed pelvic fracture ([place])   . CVD (cardiovascular disease)   . DVT (deep venous thrombosis) ([place])   . Fibrocystic breast disease   . Fracture of pubic ramus ([place])   . Hyperlipidemia   . Hypertension   . Iron deficiency anemia       Past Surgical History:  Procedure Laterality Date  . CATARACT EXTRACTION, BILATERAL  2010  . TOTAL ABDOMINAL HYSTERECTOMY  1964    Social History:  Ambulatory   cane or  walker       reports that she has never smoked. She has never used smokeless tobacco. She reports that she does not drink alcohol or use drugs.     Family History:   Family History  Problem Relation Age of Onset  . CVA Father   . CVA Mother     Allergies: Allergies  Allergen Reactions  . Penicillins Swelling  . Zocor [Simvastatin] Other (See Comments)    Weak and dizzy     Prior to Admission medications   Medication Sig Start Date End Date Taking? Authorizing Provider  acetaminophen (TYLENOL) 500 MG tablet Take 500 mg by mouth every 6 (six) hours as needed for pain.   Yes [provider]  alendronate (FOSAMAX) 70 MG tablet Take 70 mg by mouth every Thursday. Take with a full glass of water on an empty stomach.   Yes [provider]  amLODipine (NORVASC) 5 MG tablet Take 5 mg by mouth daily.   Yes [provider]  atorvastatin (LIPITOR) 20 MG tablet Take 20 mg by mouth daily.   Yes [provider]  brimonidine (ALPHAGAN) 0.2 % ophthalmic solution Place 1 drop into the right eye 2 (two) times  daily. 05/01/18  Yes [provider]  cephALEXin (KEFLEX) 500 MG capsule Take 500 mg by mouth 2 (two) times daily. 05/30/18  Yes [provider]  digoxin (LANOXIN) 0.25 MG tablet Take 0.125 mg by mouth daily.    Yes [provider]  diltiazem (TIAZAC) 120 MG 24 hr capsule Take 120 mg by mouth daily.   Yes [provider]  dorzolamide (TRUSOPT) 2 % ophthalmic solution Place 1 drop into both eyes 2 (two) times daily.    Yes [provider]  ferrous sulfate 325 (65 FE) MG tablet Take 325 mg by mouth daily with breakfast.   Yes [provider]  furosemide (LASIX) 20 MG tablet Take 20 mg by mouth daily.    Yes [provider]  latanoprost (XALATAN) 0.005 % ophthalmic solution Place 1 drop into both eyes at bedtime.    Yes [provider]  lisinopril (PRINIVIL,ZESTRIL) 20 MG tablet Take 20 mg by mouth daily.    Yes [provider]  Multiple Vitamin (MULTIVITAMIN WITH MINERALS) TABS tablet Take 1 tablet by mouth daily.   Yes [provider]  oxybutynin (DITROPAN) 5 MG tablet Take 5 mg by mouth 2 (two) times daily as needed for bladder spasms.  05/01/18  Yes [provider]  warfarin (COUMADIN) 2 MG tablet Take 2-3 mg by mouth See admin instructions. Take 1 tablet (2mg ) every Sunday, Monday, Wednesday, and Friday. Then take 1 1/2 tablets (3mg ) every Tuesday Thursday and Saturday.   Yes [provider]   Physical Exam: Blood pressure (!) 146/72, pulse 79, temperature (!) 97.5 F (36.4 C), temperature source Oral, resp. rate 12,  height 5' (1.524 m), weight 46.3 kg, SpO2 99 %. 1. General:  in No Acute distress   Chronically ill -appearing 2. Psychological: somnolent  and   Oriented 3. Head/ENT:    Dry Mucous Membranes                          Head Non traumatic, neck supple                           Poor Dentition 4. SKIN:  decreased Skin turgor,  Skin clean Dry and intact no rash 5. Heart: Regular  rate and rhythm no  Murmur, no Rub or gallop 6. Lungs: Clear to auscultation bilaterally, no wheezes or crackles   7. Abdomen: Soft,  non-tender, Non distended  bowel sounds present 8. Lower extremities: no clubbing, cyanosis, or  edema 9. Neurologically Grossly intact, moving all 4 extremities equally   10. MSK: Normal range of motion   LABS:     Recent Labs  Lab 05/31/18 1527  WBC 15.0*  HGB 13.7  HCT 40.2  MCV 88.5  PLT 397*   Basic Metabolic Panel: Recent Labs  Lab 05/31/18 1527  NA 137  K 4.9  CL 102  CO2 17*  GLUCOSE 191*  BUN 155*  CREATININE 9.67*  CALCIUM 8.6*      No results for input(s): AST, ALT, ALKPHOS, BILITOT, PROT, ALBUMIN in the last 168 hours. No results for input(s): LIPASE, AMYLASE in the last 168 hours. No results for input(s): AMMONIA in the last 168 hours.    HbA1C: No results for input(s): HGBA1C in the last 72 hours. CBG: No results for input(s): GLUCAP in the last 168 hours.    Urine analysis: No results found for: COLORURINE, APPEARANCEUR, LABSPEC, PHURINE, GLUCOSEU, HGBUR, BILIRUBINUR, KETONESUR, PROTEINUR, UROBILINOGEN, NITRITE, LEUKOCYTESUR     Cultures: No results found for: SDES, [place], CULT, REPTSTATUS   Radiological Exams on Admission: Ct Renal Stone Study  Result Date: 05/31/2018 CLINICAL DATA:  Generalized weakness for the past 10 days. EXAM: CT ABDOMEN AND PELVIS WITHOUT CONTRAST TECHNIQUE: Multidetector CT imaging of the abdomen and pelvis was performed following the standard protocol without IV contrast. COMPARISON:  Abdominal MRI-02/28/2018; pelvic MRI-03/13/2018; right hip CT-02/15/2016 FINDINGS: The lack of intravenous contrast limits the ability to evaluate solid abdominal organs. Lower chest: Limited visualization of the lower thorax demonstrates minimal dependent subpleural ground-glass atelectasis. No pleural effusion. Cardiomegaly. Coronary artery calcifications. Exuberant calcifications within the mitral  valve annulus. No pericardial effusion. Hepatobiliary: Normal hepatic contour. There is a jack shaped approximately 1.6 x 1.2 cm radiopaque stone within the neck of an otherwise normal-appearing gallbladder (coronal image 35, series 6), similar to abdominal MRI performed 02/28/2018. No gallbladder wall thickening or pericholecystic stranding. No ascites. Pancreas: Note is made of an approximately 1.0 cm hypoattenuating lesion within the mid body of the pancreas which is incompletely characterized on this noncontrast examination though appears morphologically similar to the 02/2018 examination and is again favored to represent a benign side branch IPMN. No peripancreatic stranding. Spleen: Slightly diminutive otherwise normal appearance. Adrenals/Urinary Tract: Previously characterized approximately 5.2 cm partially exophytic left-sided renal cyst is unchanged. Calcifications about the bilateral renal hilar favored to be vascular in etiology. No renal stones. No urinary obstruction or perinephric stranding. Normal noncontrast appearance the bilateral adrenal glands. Normal noncontrast appearance of the urinary bladder. Stomach/Bowel: Large colonic stool burden without evidence of enteric obstruction. Colonic diverticulosis without  evidence of diverticulitis. Normal noncontrast appearance of the terminal ileum. The appendix is not visualized however there is no definitive pericecal inflammatory change on this noncontrast examination. No pneumoperitoneum, pneumatosis or portal venous gas. Vascular/Lymphatic: There is a large amount of atherosclerotic plaque within a tortuous and mildly ectatic abdominal aorta which measures approximately 2.5 cm in greatest oblique short axis coronal diameter (coronal image 34, series 6). No bulky retroperitoneal, mesenteric, pelvic or inguinal lymphadenopathy. Reproductive: Post hysterectomy.  No discrete adnexal lesion. Other: Regional soft tissues appear normal. Musculoskeletal:  Old/healed fractures involving the right superior (image 60, series 2) and inferior pubic rami (image 69). Mixed lytic and sclerotic lesion involving the posterior superior aspect the right iliac crest (images 35 and 39, series 3) appears similar to recent pelvic MRI performed 03/13/2018 at which time are favored to represent either the sequela of prior bone infarct early Paget's disease. IMPRESSION: 1. No acute findings within the abdomen or pelvis. 2. Large colonic stool burden without evidence of enteric obstruction. 3. Cholelithiasis without evidence of superimposed acute cholecystitis on this noncontrast examination and similar to abdominal MRI performed 02/28/2018. If there is clinical concern for acute cholecystitis, further evaluation with right upper quadrant abdominal pain could be performed as indicated. 4. Approximately 1 cm hypoattenuating pancreatic lesion, similar to abdominal MRI performed 02/2018 at which time lesion was favored to be benign however a 2 year follow-up MRI was recommended to ensure stability. 5.  Aortic Atherosclerosis (ICD10-I70.0). 6. Mild ectasia of the abdominal aorta measuring 2.5 cm in diameter. Recommend follow-up aortic ultrasound in 5 years. This recommendation follows ACR consensus guidelines: White Paper of the ACR Incidental Findings Committee II on Vascular Findings. J Am Coll Radiol 2013; 10:789-794. Electronically Signed   By: Sandi Mariscal M.D.   On: 05/31/2018 17:24    Chart has been reviewed    Assessment/Plan   84 y.o. female with medical history significant of A. fib on Coumadin,   CHF and COPD, HLD    Admitted for severe acute renal failure secondary to dehydration  Present on Admission: . AKI (acute kidney injury) ([place]) - - likely secondary to dehydration, check FeNA treat with  IVF appreciate renal consult.   . Dehydration rehydrate with IV fluids and follow kidney function . Essential hypertension - cont Cardizem hold ACEi . Atrial  fibrillation, chronic (HCC) -           - CHA2DS2 vas score 5:  On Coumadin INR >3 hold Coumadin for tonight if kidney failure does not improve and the patient requires dialysis would need procedures done if kidney function improves and/or patient has no indication for dialysis can resume Coumadin         -  Rate control:  Currently controlled with  Diltiazem  will continue        Check digoxin level prior to restarting digoxin  . COPD (chronic obstructive pulmonary disease) (HCC) currently stable continue to monitor  . Glaucoma restart home medications  . Hyperlipidemia stable restart Lipitor  Metabolic acidosis - likely secondary to acute renal failure will obtain VBG  Stool burden per CT scan patient had a BM today will order bowel regimen diarrhea has resolved 2 weeks ago  Recently questionable UTI.  Patient had no dysuria no fever family states primary care provider was not sure if it was true UTI.  Will hold off on further antibiotics for now check UA and follow  Other plan as per orders.  DVT prophylaxis: Therapeutic on coumadin  ,  SCD  Code Status:  FULL CODE  as per family  I had personally discussed CODE STATUS with patient and family  Family Communication:   Family at  Bedside  plan of care was discussed with   Daughter,  Disposition Plan:    To home once workup is complete and patient is stable                      Would benefit from PT/OT eval prior to DC  Ordered                   Swallow eval - SLP ordered                   Social Work  consulted                   Nutrition    consulted                  Wound care  consulted                   Palliative care    consulted                   Behavioral health  consulted                    Consults called: nephrology is aware  Admission status:   inpatient     Level of care    tele            Toy Baker 05/31/2018, 8:16 PM    [facility]  Pager [phone]   after 2 AM please page floor coverage  PA If 7AM-7PM, please contact the day team taking care of the patient  [email or web address]  Password [email or web address]

## 2018-05-31 NOTE — Consult Note (Signed)
Reason for Consult: Acute Kidney Injury Referring Physician:  Dr. Darl Householder  Chief Complaint: Weakness and abnormal labs  Assessment/Plan:  1. Acute Kidney Injury - By exam and history appears to be volume down; with no e/o dsypnea would aggressively hydrate with isotonic fluids as tolerated.  - Will also check a U/A and FENA; CT shows no e/o obstruction. Hopefully she is only prerenal and has not progressed to ATN.  - I counseled the pt and daughter who was bedside to notify the MD or nursing if she feels dyspneic and that if there is no significant improvement in renal function by tomorrow then this is likely ATN. - Will hold off on serologies pending urinary studies. - Certainly the nausea may be from uremia and if renal function does not improve tomorrow with hydration then will strongly consider initiating HD in the AM. - Hold ACE and Lasix for now. 2. ?infection being treated for UTI by PMD - let's confirm by urinalysis with microscopy if there are any signs of an infection. She denies any fever, chills or dysuria. 3. Afib - on coumadin. 4. H/o CHF - would continue to hold Lasix for now. 5. CASHD    HPI: Kelli Peterson is an 84 y.o. female  hx of barrett's esophagus, afib on coumadin, CHF, HL, HTN here with weakness, failure to thrive for the past 2 weeks. History obtained via pt, chart and daughter who was bedside. Patient had diarreha about 2 weeks ago which were thought to be viral with associated nausea but no vomiting but even with resolution she has still not felt well w/ weakness and malaise.  However, her appetite has not come back with intermittent nausea; daughter states she's been eating and drinking but not much over the past 2 weeks.  She went to see her doctor yesterday and was prescribed Keflex for possible UTI but no positive cultures; Lasix was also stopped at that time.  Patient also had labs drawn at the same time and was told that she has acute renal failure leading to ED  visit.   Patient states that she is urinating less than usual but denies any dysuria or hematuria or foamy urine. She also denies any ESRD in the family and also denies NSAID use. No rashes or photophobia or arthralgias.  ROS Pertinent items are noted in HPI.  Chemistry and CBC: Creatinine  Date/Time Value Ref Range Status  02/20/2016 0.6 0.5 - 1.1 mg/dL Final   Creatinine, Ser  Date/Time Value Ref Range Status  05/31/2018 03:27 PM 9.67 (H) 0.44 - 1.00 mg/dL Final  02/18/2016 05:33 AM 0.70 0.44 - 1.00 mg/dL Final  02/15/2016 06:30 PM 0.70 0.44 - 1.00 mg/dL Final   Recent Labs  Lab 05/31/18 1527  NA 137  K 4.9  CL 102  CO2 17*  GLUCOSE 191*  BUN 155*  CREATININE 9.67*  CALCIUM 8.6*   Recent Labs  Lab 05/31/18 1527  WBC 15.0*  HGB 13.7  HCT 40.2  MCV 88.5  PLT 423*   Liver Function Tests: No results for input(s): AST, ALT, ALKPHOS, BILITOT, PROT, ALBUMIN in the last 168 hours. No results for input(s): LIPASE, AMYLASE in the last 168 hours. No results for input(s): AMMONIA in the last 168 hours. Cardiac Enzymes: No results for input(s): CKTOTAL, CKMB, CKMBINDEX, TROPONINI in the last 168 hours. Iron Studies: No results for input(s): IRON, TIBC, TRANSFERRIN, FERRITIN in the last 72 hours. PT/INR: '@LABRCNTIP' (inr:5)  Xrays/Other Studies: ) Results for orders placed or performed  during the hospital encounter of 05/31/18 (from the past 48 hour(s))  Basic metabolic panel     Status: Abnormal   Collection Time: 05/31/18  3:27 PM  Result Value Ref Range   Sodium 137 135 - 145 mmol/L   Potassium 4.9 3.5 - 5.1 mmol/L   Chloride 102 98 - 111 mmol/L   CO2 17 (L) 22 - 32 mmol/L   Glucose, Bld 191 (H) 70 - 99 mg/dL   BUN 155 (H) 8 - 23 mg/dL   Creatinine, Ser 9.67 (H) 0.44 - 1.00 mg/dL   Calcium 8.6 (L) 8.9 - 10.3 mg/dL   GFR calc non Af Amer 3 (L) >60 mL/min   GFR calc Af Amer 4 (L) >60 mL/min    Comment: (NOTE) The eGFR has been calculated using the CKD EPI  equation. This calculation has not been validated in all clinical situations. eGFR's persistently <60 mL/min signify possible Chronic Kidney Disease.    Anion gap 18 (H) 5 - 15    Comment: Performed at [facility], [place] [address], [place], [place] [address]  CBC     Status: Abnormal   Collection Time: 05/31/18  3:27 PM  Result Value Ref Range   WBC 15.0 (H) 4.0 - 10.5 K/uL   RBC 4.54 3.87 - 5.11 MIL/uL   Hemoglobin 13.7 12.0 - 15.0 g/dL   HCT 40.2 36.0 - 46.0 %   MCV 88.5 78.0 - 100.0 fL   MCH 30.2 26.0 - 34.0 pg   MCHC 34.1 30.0 - 36.0 g/dL   RDW 14.0 11.5 - 15.5 %   Platelets 423 (H) 150 - 400 K/uL    Comment: Performed at [facility] [address] [place], [place] [address]  Protime-INR     Status: Abnormal   Collection Time: 05/31/18  3:27 PM  Result Value Ref Range   Prothrombin Time 31.9 (H) 11.4 - 15.2 seconds   INR 3.13     Comment: Performed at [facility] [address] [place], [place] [address]  I-stat troponin, ED     Status: None   Collection Time: 05/31/18  4:52 PM  Result Value Ref Range   Troponin i, poc 0.05 0.00 - 0.08 ng/mL   Comment 3            Comment: Due to the release kinetics of cTnI, a negative result within the first hours of the onset of symptoms does not rule out myocardial infarction with certainty. If myocardial infarction is still suspected, repeat the test at appropriate intervals.    Ct Renal Stone Study  Result Date: 05/31/2018 CLINICAL DATA:  Generalized weakness for the past 10 days. EXAM: CT ABDOMEN AND PELVIS WITHOUT CONTRAST TECHNIQUE: Multidetector CT imaging of the abdomen and pelvis was performed following the standard protocol without IV contrast. COMPARISON:  Abdominal MRI-02/28/2018; pelvic MRI-03/13/2018; right hip CT-02/15/2016 FINDINGS: The lack of intravenous contrast limits the ability to evaluate solid abdominal organs. Lower chest: Limited visualization of the lower thorax demonstrates minimal  dependent subpleural ground-glass atelectasis. No pleural effusion. Cardiomegaly. Coronary artery calcifications. Exuberant calcifications within the mitral valve annulus. No pericardial effusion. Hepatobiliary: Normal hepatic contour. There is a jack shaped approximately 1.6 x 1.2 cm radiopaque stone within the neck of an otherwise normal-appearing gallbladder (coronal image 35, series 6), similar to abdominal MRI performed 02/28/2018. No gallbladder wall thickening or pericholecystic stranding. No ascites. Pancreas: Note is made of an approximately 1.0 cm hypoattenuating lesion within the mid  body of the pancreas which is incompletely characterized on this noncontrast examination though appears morphologically similar to the 02/2018 examination and is again favored to represent a benign side branch IPMN. No peripancreatic stranding. Spleen: Slightly diminutive otherwise normal appearance. Adrenals/Urinary Tract: Previously characterized approximately 5.2 cm partially exophytic left-sided renal cyst is unchanged. Calcifications about the bilateral renal hilar favored to be vascular in etiology. No renal stones. No urinary obstruction or perinephric stranding. Normal noncontrast appearance the bilateral adrenal glands. Normal noncontrast appearance of the urinary bladder. Stomach/Bowel: Large colonic stool burden without evidence of enteric obstruction. Colonic diverticulosis without evidence of diverticulitis. Normal noncontrast appearance of the terminal ileum. The appendix is not visualized however there is no definitive pericecal inflammatory change on this noncontrast examination. No pneumoperitoneum, pneumatosis or portal venous gas. Vascular/Lymphatic: There is a large amount of atherosclerotic plaque within a tortuous and mildly ectatic abdominal aorta which measures approximately 2.5 cm in greatest oblique short axis coronal diameter (coronal image 34, series 6). No bulky retroperitoneal, mesenteric, pelvic  or inguinal lymphadenopathy. Reproductive: Post hysterectomy.  No discrete adnexal lesion. Other: Regional soft tissues appear normal. Musculoskeletal: Old/healed fractures involving the right superior (image 60, series 2) and inferior pubic rami (image 69). Mixed lytic and sclerotic lesion involving the posterior superior aspect the right iliac crest (images 35 and 39, series 3) appears similar to recent pelvic MRI performed 03/13/2018 at which time are favored to represent either the sequela of prior bone infarct early Paget's disease. IMPRESSION: 1. No acute findings within the abdomen or pelvis. 2. Large colonic stool burden without evidence of enteric obstruction. 3. Cholelithiasis without evidence of superimposed acute cholecystitis on this noncontrast examination and similar to abdominal MRI performed 02/28/2018. If there is clinical concern for acute cholecystitis, further evaluation with right upper quadrant abdominal pain could be performed as indicated. 4. Approximately 1 cm hypoattenuating pancreatic lesion, similar to abdominal MRI performed 02/2018 at which time lesion was favored to be benign however a 2 year follow-up MRI was recommended to ensure stability. 5.  Aortic Atherosclerosis (ICD10-I70.0). 6. Mild ectasia of the abdominal aorta measuring 2.5 cm in diameter. Recommend follow-up aortic ultrasound in 5 years. This recommendation follows ACR consensus guidelines: White Paper of the ACR Incidental Findings Committee II on Vascular Findings. J Am Coll Radiol 2013; 10:789-794. Electronically Signed   By: Sandi Mariscal M.D.   On: 05/31/2018 17:24    PMH:   Past Medical History:  Diagnosis Date  . Adenomatous colon polyp 2007  . Atrial fibrillation ([place])   . Barrett's esophagus 2007  . 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 Cervical cancer ([place])   . CHF (congestive heart failure) ([place])   . Closed pelvic fracture ([place])   . CVD (cardiovascular disease)   . DVT (deep venous thrombosis) ([place])   .  Fibrocystic breast disease   . Fracture of pubic ramus ([place])   . Hyperlipidemia   . Hypertension   . Iron deficiency anemia     PSH:   Past Surgical History:  Procedure Laterality Date  . CATARACT EXTRACTION, BILATERAL  2010  . TOTAL ABDOMINAL HYSTERECTOMY  1964    Allergies:  Allergies  Allergen Reactions  . Penicillins Swelling  . Zocor [Simvastatin] Other (See Comments)    Weak and dizzy    Medications:   Prior to Admission medications   Medication Sig Start Date End Date Taking? Authorizing Provider  acetaminophen (TYLENOL) 500 MG tablet Take 500 mg by mouth every 6 (six) hours as needed  for pain.   Yes [provider]  alendronate (FOSAMAX) 70 MG tablet Take 70 mg by mouth every Thursday. Take with a full glass of water on an empty stomach.   Yes [provider]  amLODipine (NORVASC) 5 MG tablet Take 5 mg by mouth daily.   Yes [provider]  atorvastatin (LIPITOR) 20 MG tablet Take 20 mg by mouth daily.   Yes [provider]  brimonidine (ALPHAGAN) 0.2 % ophthalmic solution Place 1 drop into the right eye 2 (two) times daily. 05/01/18  Yes [provider]  cephALEXin (KEFLEX) 500 MG capsule Take 500 mg by mouth 2 (two) times daily. 05/30/18  Yes [provider]  digoxin (LANOXIN) 0.25 MG tablet Take 0.125 mg by mouth daily.    Yes [provider]  diltiazem (TIAZAC) 120 MG 24 hr capsule Take 120 mg by mouth daily.   Yes [provider]  dorzolamide (TRUSOPT) 2 % ophthalmic solution Place 1 drop into both eyes 2 (two) times daily.    Yes [provider]  ferrous sulfate 325 (65 FE) MG tablet Take 325 mg by mouth daily with breakfast.   Yes [provider]  furosemide (LASIX) 20 MG tablet Take 20 mg by mouth daily.    Yes [provider]  latanoprost (XALATAN) 0.005 % ophthalmic solution Place 1 drop into both eyes at bedtime.    Yes [provider]  lisinopril  (PRINIVIL,ZESTRIL) 20 MG tablet Take 20 mg by mouth daily.    Yes [provider]  Multiple Vitamin (MULTIVITAMIN WITH MINERALS) TABS tablet Take 1 tablet by mouth daily.   Yes [provider]  oxybutynin (DITROPAN) 5 MG tablet Take 5 mg by mouth 2 (two) times daily as needed for bladder spasms.  05/01/18  Yes [provider]  warfarin (COUMADIN) 2 MG tablet Take 2-3 mg by mouth See admin instructions. Take 1 tablet (25m) every Sunday, Monday, Wednesday, and Friday. Then take 1 1/2 tablets (342m every Tuesday Thursday and Saturday.   Yes [provider]    Discontinued Meds:   Medications Discontinued During This Encounter  Medication Reason  . alendronate (FOSAMAX) 70 MG tablet Discontinued by provider  . Omega-3 Fatty Acids (FISH OIL PO) Patient Preference  . calcium carbonate (CALCIUM 600) 600 MG TABS tablet Patient Preference  . diltiazem (CARDIZEM CD) 300 MG 24 hr capsule Dose change  . warfarin (COUMADIN) 3 MG tablet Change in therapy    Social History:  reports that she has never smoked. She has never used smokeless tobacco. She reports that she does not drink alcohol or use drugs.  Family History:   Family History  Problem Relation Age of Onset  . CVA Father   . CVA Mother     Blood pressure (!) 146/72, pulse 79, temperature (!) 97.5 F (36.4 C), temperature source Oral, resp. rate 12, height 5' (1.524 m), weight 46.3 kg, SpO2 99 %. General appearance: alert, cooperative and appears stated age Head: Normocephalic, without obvious abnormality, atraumatic Eyes: negative Neck: no adenopathy, no carotid bruit, no JVD, supple, symmetrical, trachea midline and thyroid not enlarged, symmetric, no tenderness/mass/nodules Back: symmetric, no curvature. ROM normal. No CVA tenderness. Resp: clear to auscultation bilaterally Chest wall: no tenderness, skin tenting present Cardio: irregular GI: soft, non-tender; bowel sounds normal; no masses,  no  organomegaly Extremities: extremities normal, atraumatic, no cyanosis or edema Pulses: 2+ and symmetric Skin: Skin color, texture, turgor normal. No rashes or lesions or tenting Lymph  nodes: Cervical, supraclavicular, and axillary nodes normal. Neurologic: Grossly normal       Prinston Kynard, Hunt Oris, MD 05/31/2018, 7:14 PM

## 2018-05-31 NOTE — ED Notes (Signed)
Patient transported to CT 

## 2018-05-31 NOTE — ED Provider Notes (Signed)
[facility] EMERGENCY DEPARTMENT Provider Note   CSN: [id number] Arrival date & time: 05/31/18  1507     History   Chief Complaint Chief Complaint  Patient presents with  . Weakness  . abnormal labs    HPI Kelli Peterson is a 84 y.o. female hx of barrett's esophagus, afib on coumadin, CHF, HL, HTN here with weakness, failure to thrive. Patient states that she started having some loose stools about 2 weeks ago.  She thought she had a gastroenteritis at that time.  However, her appetite never came back and she just feels nauseated all the time.  Denies any vomiting or persistent diarrhea.  Denies any fevers or chills.  She went to see her doctor yesterday and was prescribed Keflex for possible UTI.  Patient also had labs drawn and was told that she has acute renal failure so was sent here for evaluation.  Patient states that she is urinating less than usual but denies any dysuria or hematuria.  The history is provided by the patient.    Past Medical History:  Diagnosis Date  . Adenomatous colon polyp 2007  . Atrial fibrillation ([place])   . Barrett's esophagus 2007  . Cerebellar hemorrhage ([place])   . Cervical cancer ([place])   . CHF (congestive heart failure) ([place])   . Closed pelvic fracture ([place])   . CVD (cardiovascular disease)   . DVT (deep venous thrombosis) ([place])   . Fibrocystic breast disease   . Fracture of pubic ramus ([place])   . Hyperlipidemia   . Hypertension   . Iron deficiency anemia     Patient Active Problem List   Diagnosis Date Noted  . Acute right hip pain   . Atrial fibrillation with RVR ([place])   . Pelvic fracture, closed, initial encounter 02/15/2016  . Fracture of pubic ramus ([place]) 02/15/2016    Past Surgical History:  Procedure Laterality Date  . CATARACT EXTRACTION, BILATERAL  2010  . TOTAL ABDOMINAL HYSTERECTOMY  1964     OB History   None      Home Medications    Prior to Admission medications   Medication Sig Start Date  End Date Taking? Authorizing Provider  acetaminophen (TYLENOL) 500 MG tablet Take 500 mg by mouth every 6 (six) hours as needed for pain.    [provider]  alendronate (FOSAMAX) 70 MG tablet Take 70 mg by mouth every Wednesday. Take with a full glass of water on an empty stomach.    [provider]  amLODipine (NORVASC) 5 MG tablet Take 5 mg by mouth daily.    [provider]  atorvastatin (LIPITOR) 20 MG tablet Take 20 mg by mouth daily.    [provider]  calcium carbonate (CALCIUM 600) 600 MG TABS tablet Take 600 mg by mouth daily with breakfast.    [provider]  digoxin (LANOXIN) 0.25 MG tablet Take 0.125 mg by mouth daily.     [provider]  diltiazem (CARDIZEM CD) 300 MG 24 hr capsule Take 300 mg by mouth daily.    [provider]  dorzolamide (TRUSOPT) 2 % ophthalmic solution Place 1 drop into both eyes 2 (two) times daily.     [provider]  furosemide (LASIX) 20 MG tablet Take 20 mg by mouth daily.     [provider]  latanoprost (XALATAN) 0.005 % ophthalmic solution Place 1 drop into both eyes at bedtime.     [provider]  lisinopril (PRINIVIL,ZESTRIL) 20  MG tablet Take 30 mg by mouth daily.     [provider]  Multiple Vitamin (MULTIVITAMIN WITH MINERALS) TABS tablet Take 1 tablet by mouth daily.    [provider]  Omega-3 Fatty Acids (FISH OIL PO) Take 1 capsule by mouth daily.    [provider]  warfarin (COUMADIN) 2 MG tablet Take 2 mg by mouth. On Sunday, Monday, Wednesday, and Friday    [provider]  warfarin (COUMADIN) 3 MG tablet Take 3 mg by mouth daily. Tues, Thurs, Saturday    [provider]    Family History Family History  Problem Relation Age of Onset  . CVA Father   . CVA Mother     Social History Social History   Tobacco Use  . Smoking status: Never Smoker  . Smokeless tobacco: Never Used  Substance Use  Topics  . Alcohol use: No  . Drug use: No     Allergies   Penicillins and Zocor [simvastatin]   Review of Systems Review of Systems  Gastrointestinal: Positive for nausea.  Neurological: Positive for weakness.  All other systems reviewed and are negative.    Physical Exam Updated Vital Signs BP (!) 146/72   Pulse 79   Temp (!) 97.5 F (36.4 C) (Oral)   Resp 12   Ht 5' (1.524 m)   Wt 46.3 kg   SpO2 99%   BMI 19.92 kg/m   Physical Exam  Constitutional: She is oriented to person, place, and time.  Dehydrated, MM dry   HENT:  Head: Normocephalic.  MM dry   Eyes: Pupils are equal, round, and reactive to light. Conjunctivae and EOM are normal.  Neck: Normal range of motion. Neck supple.  Cardiovascular: Normal rate, regular rhythm and normal heart sounds.  Pulmonary/Chest: Effort normal and breath sounds normal. No stridor. No respiratory distress. She has no wheezes.  Abdominal: Soft. Bowel sounds are normal. She exhibits no distension. There is no tenderness. There is no guarding.  Musculoskeletal: Normal range of motion.  Neurological: She is alert and oriented to person, place, and time. No cranial nerve deficit. Coordination normal.  Skin: Skin is warm.  Psychiatric: She has a normal mood and affect.  Nursing note and vitals reviewed.    ED Treatments / Results  Labs (all labs ordered are listed, but only abnormal results are displayed) Labs Reviewed  BASIC METABOLIC PANEL - Abnormal; Notable for the following components:      Result Value   CO2 17 (*)    Glucose, Bld 191 (*)    BUN 155 (*)    Creatinine, Ser 9.67 (*)    Calcium 8.6 (*)    GFR calc non Af Amer 3 (*)    GFR calc Af Amer 4 (*)    Anion gap 18 (*)    All other components within normal limits  CBC - Abnormal; Notable for the following components:   WBC 15.0 (*)    Platelets 423 (*)    All other components within normal limits  URINE CULTURE  URINALYSIS, ROUTINE W REFLEX MICROSCOPIC    PROTIME-INR  I-STAT TROPONIN, ED    EKG EKG Interpretation  Date/Time:  Wednesday May 31 2018 15:50:46 EDT Ventricular Rate:  58 PR Interval:    QRS Duration: 92 QT Interval:  366 QTC Calculation: 359 R Axis:   1 Text Interpretation:  Atrial fibrillation with slow ventricular response Anterior infarct , age undetermined Abnormal ECG Since last tracing rate slower Confirmed by Darl Householder,  Fenton Malling ([id number]) on 05/31/2018 4:23:54 PM   Radiology Ct Renal Stone Study  Result Date: 05/31/2018 CLINICAL DATA:  Generalized weakness for the past 10 days. EXAM: CT ABDOMEN AND PELVIS WITHOUT CONTRAST TECHNIQUE: Multidetector CT imaging of the abdomen and pelvis was performed following the standard protocol without IV contrast. COMPARISON:  Abdominal MRI-02/28/2018; pelvic MRI-03/13/2018; right hip CT-02/15/2016 FINDINGS: The lack of intravenous contrast limits the ability to evaluate solid abdominal organs. Lower chest: Limited visualization of the lower thorax demonstrates minimal dependent subpleural ground-glass atelectasis. No pleural effusion. Cardiomegaly. Coronary artery calcifications. Exuberant calcifications within the mitral valve annulus. No pericardial effusion. Hepatobiliary: Normal hepatic contour. There is a jack shaped approximately 1.6 x 1.2 cm radiopaque stone within the neck of an otherwise normal-appearing gallbladder (coronal image 35, series 6), similar to abdominal MRI performed 02/28/2018. No gallbladder wall thickening or pericholecystic stranding. No ascites. Pancreas: Note is made of an approximately 1.0 cm hypoattenuating lesion within the mid body of the pancreas which is incompletely characterized on this noncontrast examination though appears morphologically similar to the 02/2018 examination and is again favored to represent a benign side branch IPMN. No peripancreatic stranding. Spleen: Slightly diminutive otherwise normal appearance. Adrenals/Urinary Tract: Previously  characterized approximately 5.2 cm partially exophytic left-sided renal cyst is unchanged. Calcifications about the bilateral renal hilar favored to be vascular in etiology. No renal stones. No urinary obstruction or perinephric stranding. Normal noncontrast appearance the bilateral adrenal glands. Normal noncontrast appearance of the urinary bladder. Stomach/Bowel: Large colonic stool burden without evidence of enteric obstruction. Colonic diverticulosis without evidence of diverticulitis. Normal noncontrast appearance of the terminal ileum. The appendix is not visualized however there is no definitive pericecal inflammatory change on this noncontrast examination. No pneumoperitoneum, pneumatosis or portal venous gas. Vascular/Lymphatic: There is a large amount of atherosclerotic plaque within a tortuous and mildly ectatic abdominal aorta which measures approximately 2.5 cm in greatest oblique short axis coronal diameter (coronal image 34, series 6). No bulky retroperitoneal, mesenteric, pelvic or inguinal lymphadenopathy. Reproductive: Post hysterectomy.  No discrete adnexal lesion. Other: Regional soft tissues appear normal. Musculoskeletal: Old/healed fractures involving the right superior (image 60, series 2) and inferior pubic rami (image 69). Mixed lytic and sclerotic lesion involving the posterior superior aspect the right iliac crest (images 35 and 39, series 3) appears similar to recent pelvic MRI performed 03/13/2018 at which time are favored to represent either the sequela of prior bone infarct early Paget's disease. IMPRESSION: 1. No acute findings within the abdomen or pelvis. 2. Large colonic stool burden without evidence of enteric obstruction. 3. Cholelithiasis without evidence of superimposed acute cholecystitis on this noncontrast examination and similar to abdominal MRI performed 02/28/2018. If there is clinical concern for acute cholecystitis, further evaluation with right upper quadrant  abdominal pain could be performed as indicated. 4. Approximately 1 cm hypoattenuating pancreatic lesion, similar to abdominal MRI performed 02/2018 at which time lesion was favored to be benign however a 2 year follow-up MRI was recommended to ensure stability. 5.  Aortic Atherosclerosis (ICD10-I70.0). 6. Mild ectasia of the abdominal aorta measuring 2.5 cm in diameter. Recommend follow-up aortic ultrasound in 5 years. This recommendation follows ACR consensus guidelines: White Paper of the ACR Incidental Findings Committee II on Vascular Findings. J Am Coll Radiol 2013; 10:789-794. Electronically Signed   By: Sandi Mariscal M.D.   On: 05/31/2018 17:24    Procedures Procedures (including critical care time)  CRITICAL CARE Performed by: Wandra Arthurs   Total critical care time: 8  minutes  Critical care time was exclusive of separately billable procedures and treating other patients.  Critical care was necessary to treat or prevent imminent or life-threatening deterioration.  Critical care was time spent personally by me on the following activities: development of treatment plan with patient and/or surrogate as well as nursing, discussions with consultants, evaluation of patient's response to treatment, examination of patient, obtaining history from patient or surrogate, ordering and performing treatments and interventions, ordering and review of laboratory studies, ordering and review of radiographic studies, pulse oximetry and re-evaluation of patient's condition.   Medications Ordered in ED Medications  sodium chloride 0.9 % bolus 1,000 mL (1,000 mLs Intravenous New Bag/Given 05/31/18 1719)  sodium chloride 0.9 % bolus 1,000 mL (has no administration in time range)     Initial Impression / Assessment and Plan / ED Course  I have reviewed the triage vital signs and the nursing notes.  Pertinent labs & imaging results that were available during my care of the patient were reviewed by me and  considered in my medical decision making (see chart for details).     RASHAE ROTHER is a 84 y.o. female here with weakness, renal failure. Appears dehydrated so I think likely pre renal etiology. Also has poor appetite, weight loss, so will get CT ab/pel to assess for mass as well. Will repeat chemistry, obtain CBC, UA. Will hydrate patient.   6:22 PM Cr is 10. BUN 155. CT showed no hydro or mass. I called Dr. Augustin Coupe from nephrology. He agrees that patient likely has prerenal etiology causing renal failure. Recommend aggressive IV hydration. He will see patient as consult. Hospitalist to admit.    Final Clinical Impressions(s) / ED Diagnoses   Final diagnoses:  None    ED Discharge Orders    None       Drenda Freeze, MD 05/31/18 [phone]

## 2018-06-01 DIAGNOSIS — N179 Acute kidney failure, unspecified: Principal | ICD-10-CM

## 2018-06-01 LAB — COMPREHENSIVE METABOLIC PANEL
ALT: 15 U/L (ref 0–44)
AST: 12 U/L — ABNORMAL LOW (ref 15–41)
Albumin: 3.1 g/dL — ABNORMAL LOW (ref 3.5–5.0)
Alkaline Phosphatase: 73 U/L (ref 38–126)
Anion gap: 15 (ref 5–15)
BUN: 138 mg/dL — ABNORMAL HIGH (ref 8–23)
CO2: 16 mmol/L — ABNORMAL LOW (ref 22–32)
Calcium: 8 mg/dL — ABNORMAL LOW (ref 8.9–10.3)
Chloride: 110 mmol/L (ref 98–111)
Creatinine, Ser: 7.77 mg/dL — ABNORMAL HIGH (ref 0.44–1.00)
GFR calc Af Amer: 5 mL/min — ABNORMAL LOW (ref >60)
GFR calc non Af Amer: 4 mL/min — ABNORMAL LOW (ref >60)
Glucose, Bld: 140 mg/dL — ABNORMAL HIGH (ref 70–99)
Potassium: 4.2 mmol/L (ref 3.5–5.1)
Sodium: 141 mmol/L (ref 135–145)
Total Bilirubin: 0.6 mg/dL (ref 0.3–1.2)
Total Protein: 7.2 g/dL (ref 6.5–8.1)

## 2018-06-01 LAB — CBC
HCT: 40.8 % (ref 36.0–46.0)
Hemoglobin: 13.7 g/dL (ref 12.0–15.0)
MCH: 29.8 pg (ref 26.0–34.0)
MCHC: 33.6 g/dL (ref 30.0–36.0)
MCV: 88.7 fL (ref 78.0–100.0)
Platelets: 371 10*3/uL (ref 150–400)
RBC: 4.6 MIL/uL (ref 3.87–5.11)
RDW: 13.7 % (ref 11.5–15.5)
WBC: 10.3 10*3/uL (ref 4.0–10.5)

## 2018-06-01 LAB — T4, FREE: Free T4: 0.96 ng/dL (ref 0.82–1.77)

## 2018-06-01 LAB — CREATININE, URINE, RANDOM: Creatinine, Urine: 58.59 mg/dL

## 2018-06-01 LAB — TSH: TSH: 0.333 u[IU]/mL — ABNORMAL LOW (ref 0.350–4.500)

## 2018-06-01 LAB — PHOSPHORUS: Phosphorus: 6.5 mg/dL — ABNORMAL HIGH (ref 2.5–4.6)

## 2018-06-01 LAB — PROTIME-INR
INR: 3.38
Prothrombin Time: 33.9 seconds — ABNORMAL HIGH (ref 11.4–15.2)

## 2018-06-01 LAB — MAGNESIUM: Magnesium: 2.7 mg/dL — ABNORMAL HIGH (ref 1.7–2.4)

## 2018-06-01 LAB — SODIUM, URINE, RANDOM: Sodium, Ur: 64 mmol/L

## 2018-06-01 MED ORDER — ENSURE ENLIVE PO LIQD
237.0000 mL | Freq: Two times a day (BID) | ORAL | Status: DC
  Administered 2018-06-01 – 2018-06-06 (×9): 237 mL via ORAL

## 2018-06-01 MED ORDER — SODIUM CHLORIDE 0.45 % IV SOLN
INTRAVENOUS | Status: DC
  Administered 2018-06-01 – 2018-06-02 (×3): via INTRAVENOUS
  Filled 2018-06-01 (×3): qty 1000

## 2018-06-01 NOTE — Progress Notes (Addendum)
PROGRESS NOTE    Kelli Peterson  DJT:[id number] DOB: Jul 23, 1930 DOA: 05/31/2018 PCP: Jani Gravel, MD   Brief Narrative: Patient is a 84 year old female with past medical history of A. fib on Coumadin, hyperlipidemia, chronic diastolic CHF who presents to the emergency department with complaints of generalized fatigue for last 2 weeks.  Patient was also noted to have diarrhea at home since last 2 weeks and was not able to drink or eat properly.  Patient was found to have severe acute kidney injury on presentation.  Assessment & Plan:   Active Problems:   AKI (acute kidney injury) ([place])   Dehydration   Essential hypertension   Atrial fibrillation, chronic ([facility])   COPD (chronic obstructive pulmonary disease) ([facility])   Glaucoma   Hyperlipidemia   Acute renal failure (ARF) ([facility])   Constipation  Acute kidney injury: Likely secondary to dehydration from diarrhea.  Also presented with severe metabolic acidosis.  Kidney function improving with IV fluids.  Continue current IV fluid regimen as recommended by nephrology.  We will continue to monitor BMP.  Chronic A. ZES:[id number] vas score 5.  Currently rate is controlled currently INR is supratherapeutic.  Continue Cardizem.  Also on digoxin at home.  Hold digoxin for now until kidney function recovers.  COPD: Currently stable  Glaucoma: Resume home medications.  Hyperlipidemia: Continue Lipitor  Questionable UTI: No mention of dysuria or fever.  Urine culture has been sent.  Will follow.  We will hold for antibiotics.  Generalized weakness/deconditioning: Will request for PT evaluation.  Low TSH: We will check free T3 and T4  DVT prophylaxis: Warfarin Code Status: Full Family Communication: Discussed with daughters at the bedside Disposition Plan: Pending PT evaluation.  Awaiting kidney function improvement   Consultants: Nephrology  Procedures: None  Antimicrobials: None  Subjective: Patient seen and examined the bedside this  morning.  Hemodynamically stable.  He still significantly weak.  Denies any chest pain, shortness of breath, abdominal pain, nausea or vomiting.  Objective: Vitals:   05/31/18 1930 05/31/18 2058 06/01/18 0520 06/01/18 0819  BP: (!) 154/65 (!) 154/69 (!) 155/75 (!) 150/64  Pulse: 60 65 78 61  Resp: 15  18 18   Temp:  (!) 97.5 F (36.4 C) 98.6 F (37 C) 97.6 F (36.4 C)  TempSrc:  Oral Oral Oral  SpO2: 99% 100% 100% 99%  Weight:      Height:        Intake/Output Summary (Last 24 hours) at 06/01/2018 1253 Last data filed at 06/01/2018 1100 Gross per 24 hour  Intake 3709.33 ml  Output 900 ml  Net 2809.33 ml   Filed Weights   05/31/18 1522  Weight: 46.3 kg    Examination:  General exam: Not in distress, generalized weakness, thin built elderly female HEENT:PERRL,Oral mucosa dry, Ear/Nose normal on gross exam Respiratory system: Bilateral equal air entry, normal vesicular breath sounds, no wheezes or crackles  Cardiovascular system: S1 & S2 heard, RRR. No JVD, murmurs, rubs, gallops or clicks. No pedal edema. Gastrointestinal system: Abdomen is nondistended, soft and nontender. No organomegaly or masses felt. Normal bowel sounds heard. Central nervous system: Alert and oriented. No focal neurological deficits. Extremities: No edema, no clubbing ,no cyanosis, distal peripheral pulses palpable. Skin: No rashes, lesions or ulcers,no icterus ,no pallor   Data Reviewed: I have personally reviewed following labs and imaging studies  CBC: Recent Labs  Lab 05/31/18 1527 06/01/18 0315  WBC 15.0* 10.3  HGB 13.7 13.7  HCT 40.2 40.8  MCV 88.5  88.7  PLT 423* 440   Basic Metabolic Panel: Recent Labs  Lab 05/31/18 1527 05/31/18 2000 06/01/18 0315  NA 137  --  141  K 4.9  --  4.2  CL 102  --  110  CO2 17*  --  16*  GLUCOSE 191*  --  140*  BUN 155*  --  138*  CREATININE 9.67*  --  7.77*  CALCIUM 8.6*  --  8.0*  MG  --  2.7* 2.7*  PHOS  --  6.5* 6.5*   GFR: Estimated  Creatinine Clearance: 3.7 mL/min (A) (by C-G formula based on SCr of 7.77 mg/dL (H)). Liver Function Tests: Recent Labs  Lab 05/31/18 2000 06/01/18 0315  AST 10* 12*  ALT 16 15  ALKPHOS 73 73  BILITOT 0.9 0.6  PROT 7.7 7.2  ALBUMIN 3.0* 3.1*   No results for input(s): LIPASE, AMYLASE in the last 168 hours. No results for input(s): AMMONIA in the last 168 hours. Coagulation Profile: Recent Labs  Lab 05/31/18 1527 06/01/18 0315  INR 3.13 3.38   Cardiac Enzymes: No results for input(s): CKTOTAL, CKMB, CKMBINDEX, TROPONINI in the last 168 hours. BNP (last 3 results) No results for input(s): PROBNP in the last 8760 hours. HbA1C: No results for input(s): HGBA1C in the last 72 hours. CBG: No results for input(s): GLUCAP in the last 168 hours. Lipid Profile: No results for input(s): CHOL, HDL, LDLCALC, TRIG, CHOLHDL, LDLDIRECT in the last 72 hours. Thyroid Function Tests: Recent Labs    06/01/18 0315  TSH 0.333*   Anemia Panel: No results for input(s): VITAMINB12, FOLATE, FERRITIN, TIBC, IRON, RETICCTPCT in the last 72 hours. Sepsis Labs: No results for input(s): PROCALCITON, LATICACIDVEN in the last 168 hours.  No results found for this or any previous visit (from the past 240 hour(s)).       Radiology Studies: Ct Renal Stone Study  Result Date: 05/31/2018 CLINICAL DATA:  Generalized weakness for the past 10 days. EXAM: CT ABDOMEN AND PELVIS WITHOUT CONTRAST TECHNIQUE: Multidetector CT imaging of the abdomen and pelvis was performed following the standard protocol without IV contrast. COMPARISON:  Abdominal MRI-02/28/2018; pelvic MRI-03/13/2018; right hip CT-02/15/2016 FINDINGS: The lack of intravenous contrast limits the ability to evaluate solid abdominal organs. Lower chest: Limited visualization of the lower thorax demonstrates minimal dependent subpleural ground-glass atelectasis. No pleural effusion. Cardiomegaly. Coronary artery calcifications. Exuberant  calcifications within the mitral valve annulus. No pericardial effusion. Hepatobiliary: Normal hepatic contour. There is a jack shaped approximately 1.6 x 1.2 cm radiopaque stone within the neck of an otherwise normal-appearing gallbladder (coronal image 35, series 6), similar to abdominal MRI performed 02/28/2018. No gallbladder wall thickening or pericholecystic stranding. No ascites. Pancreas: Note is made of an approximately 1.0 cm hypoattenuating lesion within the mid body of the pancreas which is incompletely characterized on this noncontrast examination though appears morphologically similar to the 02/2018 examination and is again favored to represent a benign side branch IPMN. No peripancreatic stranding. Spleen: Slightly diminutive otherwise normal appearance. Adrenals/Urinary Tract: Previously characterized approximately 5.2 cm partially exophytic left-sided renal cyst is unchanged. Calcifications about the bilateral renal hilar favored to be vascular in etiology. No renal stones. No urinary obstruction or perinephric stranding. Normal noncontrast appearance the bilateral adrenal glands. Normal noncontrast appearance of the urinary bladder. Stomach/Bowel: Large colonic stool burden without evidence of enteric obstruction. Colonic diverticulosis without evidence of diverticulitis. Normal noncontrast appearance of the terminal ileum. The appendix is not visualized however there is no definitive pericecal inflammatory  change on this noncontrast examination. No pneumoperitoneum, pneumatosis or portal venous gas. Vascular/Lymphatic: There is a large amount of atherosclerotic plaque within a tortuous and mildly ectatic abdominal aorta which measures approximately 2.5 cm in greatest oblique short axis coronal diameter (coronal image 34, series 6). No bulky retroperitoneal, mesenteric, pelvic or inguinal lymphadenopathy. Reproductive: Post hysterectomy.  No discrete adnexal lesion. Other: Regional soft tissues  appear normal. Musculoskeletal: Old/healed fractures involving the right superior (image 60, series 2) and inferior pubic rami (image 69). Mixed lytic and sclerotic lesion involving the posterior superior aspect the right iliac crest (images 35 and 39, series 3) appears similar to recent pelvic MRI performed 03/13/2018 at which time are favored to represent either the sequela of prior bone infarct early Paget's disease. IMPRESSION: 1. No acute findings within the abdomen or pelvis. 2. Large colonic stool burden without evidence of enteric obstruction. 3. Cholelithiasis without evidence of superimposed acute cholecystitis on this noncontrast examination and similar to abdominal MRI performed 02/28/2018. If there is clinical concern for acute cholecystitis, further evaluation with right upper quadrant abdominal pain could be performed as indicated. 4. Approximately 1 cm hypoattenuating pancreatic lesion, similar to abdominal MRI performed 02/2018 at which time lesion was favored to be benign however a 2 year follow-up MRI was recommended to ensure stability. 5.  Aortic Atherosclerosis (ICD10-I70.0). 6. Mild ectasia of the abdominal aorta measuring 2.5 cm in diameter. Recommend follow-up aortic ultrasound in 5 years. This recommendation follows ACR consensus guidelines: White Paper of the ACR Incidental Findings Committee II on Vascular Findings. J Am Coll Radiol 2013; 10:789-794. Electronically Signed   By: Sandi Mariscal M.D.   On: 05/31/2018 17:24        Scheduled Meds: . atorvastatin  20 mg Oral Daily  . brimonidine  1 drop Right Eye BID  . diltiazem  120 mg Oral Daily  . dorzolamide  1 drop Both Eyes BID  . feeding supplement (ENSURE ENLIVE)  237 mL Oral BID BM  . latanoprost  1 drop Both Eyes QHS  . senna  1 tablet Oral BID   Continuous Infusions: . sodium chloride 0.45 % 1,000 mL with sodium bicarbonate 75 mEq infusion 100 mL/hr at 06/01/18 1046     LOS: 1 day    Time spent: More than 50% of  that time was spent in counseling and/or coordination of care.      Shelly Coss, MD [facility] Pager [phone]  If 7PM-7AM, please contact night-coverage [email or web address] Password [facility] 06/01/2018, 12:53 PM

## 2018-06-01 NOTE — Progress Notes (Signed)
Initial Nutrition Assessment  DOCUMENTATION CODES:   Severe malnutrition in context of chronic illness  INTERVENTION:   Ensure Enlive po BID, each supplement provides 350 kcal and 20 grams of protein  Recommend liberalizing diet to REGULAR   NUTRITION DIAGNOSIS:   Severe Malnutrition related to chronic illness(FTT post pelvic fracture, CHF) as evidenced by energy intake < or equal to 75% for > or equal to 1 month, severe muscle depletion, moderate fat depletion.  GOAL:   Patient will meet greater than or equal to 90% of their needs  MONITOR:   PO intake, Supplement acceptance, Labs, Weight trends  REASON FOR ASSESSMENT:   Consult Malnutrition Eval  ASSESSMENT:   84 yo female presents with fatigue and decreased po intake x 2 weeks who is admitted with AKI likely secondary to dehydration PMH includes CHF, cerebellar hemorrhange, Barrett's esophagus, HTN, HLD, iron deficiency anemia, pelvic fracture   Daughter at bedside; pt sleeping. Per daughter, pt did not sleep well last night and has been sleeping since breakfast. Pt arouseable but falls back asleep. History obtained from daughters.   Recorded po intake 75% at breakfast. Daughter confirms pt ate "good" breakfast this AM. Pt consisted of . Daughter reports pt po intake/appetite has been down for 2 weeks and coincides with initiation of diarrhea/?gastroenteritis. Pt eating and drinking minimally; daughters did get pt to start drinking Boost/Ensure daily but only able to drink 1. Prior to this, daughter reports pt eating relatively well; eating 3 meals per day. Pt resides with 1 of her other daughters and this daughter prepares all her meals. This RD called and spoke with [name] (daughter that pt lives with). [name] indicates that pt was eating very small portions bites over the past few days, drinking 1/2 to maybe 1 Ensure per day. Prior to 2 weeks, pt eating 3 meals per day but small amounts; 1 fist full at meal times. Pt might  eat shrimp fried rice for dinner, but only a fist full. Same at all meals. Pt would not eat if she was not hungry.   Daughter indicates pt with 5 pound wt loss in 2 weeks. Noted pt weighed 107 pounds in May of this year; current wt of 102 pounds. [name] indicates that pt recent wt has been stable at 108 pounds until recently. 4.7% weight loss in 2 weeks which is significant for time frame. Some weight loss  Daughter, [name], believes that pt with general decline since pelvic fracture several years ago. Weight and intake started trending down at that ime.    Labs: BUN 138, Creatinine 7.77, phosphorus 6.5 (H) Meds: 1/2 NS @ 100 ml/hr with sodium bicarb  NUTRITION - FOCUSED PHYSICAL EXAM:    Most Recent Value  Orbital Region  Moderate depletion  Upper Arm Region  No depletion  Thoracic and Lumbar Region  Mild depletion  Buccal Region  Moderate depletion  Temple Region  Severe depletion  Clavicle Bone Region  Severe depletion  Clavicle and Acromion Bone Region  Moderate depletion  Scapular Bone Region  Severe depletion  Dorsal Hand  Mild depletion  Patellar Region  Mild depletion  Anterior Thigh Region  Mild depletion  Posterior Calf Region  Mild depletion  Edema (RD Assessment)  None       Diet Order:   Diet Order            Diet regular Room service appropriate? Yes; Fluid consistency: Thin  Diet effective now  EDUCATION NEEDS:   Education needs have been addressed  Skin:  Skin Assessment: Reviewed RN Assessment  Last BM:  no documented  Height:   Ht Readings from Last 1 Encounters:  05/31/18 5' (1.524 m)    Weight:   Wt Readings from Last 1 Encounters:  05/31/18 46.3 kg    Ideal Body Weight:     BMI:  Body mass index is 19.92 kg/m.  Estimated Nutritional Needs:   Kcal:  1450-1650 kcals   Protein:  65-75 g  Fluid:  >/= 1.5 L   Kerman Passey MS, RD, LDN, CNSC [phone] Pager  [phone] Weekend/On-Call Pager

## 2018-06-01 NOTE — Evaluation (Signed)
Physical Therapy Evaluation Patient Details Name: Kelli Peterson MRN: [id number] DOB: [birth year] Today's Date: 06/01/2018   History of Present Illness  Pt is a 84 y.o. F iwth significant PMH of a.fib on Coumadin, hyperlipidemia, chronic diastolic CHF who presents to ED with complaints of genealized fatigue for last 2 weeks. Also noted to have diarrhea at home since last 2 seeks and not able to drink or eat properly. Found to have severe acute kidney injury on presentation.  Clinical Impression  No family/caregiver present to determine patient's baseline cognition or previous level of function. Oriented to only self and place and unable to follow simple commands consistently. Patient currently transferring with min guard assist using walker. Displaying decreased functional mobility secondary to decreased safety awareness, orientation, activity tolerance, balance deficits, and generalized weakness. Presenting as a high fall risk based on deficits seen during examination. Recommending SNF at discharge however will follow acutely to progress mobility and discuss discharge planning once family is present.    Follow Up Recommendations SNF;Supervision/Assistance - 24 hour    Equipment Recommendations  None recommended by PT    Recommendations for Other Services       Precautions / Restrictions Precautions Precautions: Fall Restrictions Weight Bearing Restrictions: No      Mobility  Bed Mobility Overal bed mobility: Needs Assistance Bed Mobility: Supine to Sit     Supine to sit: Supervision     General bed mobility comments: supervision for safety and significantly increased time with cues for direction  Transfers Overall transfer level: Needs assistance Equipment used: Rolling walker (2 wheeled) Transfers: Sit to/from [facility] Sit to Stand: Min guard Stand pivot transfers: Min guard       General transfer comment: trunk flexed throughout transfer from bed to  chair  Ambulation/Gait                Stairs            Wheelchair Mobility    Modified Rankin (Stroke Patients Only)       Balance Overall balance assessment: Needs assistance Sitting-balance support: Bilateral upper extremity supported;Feet supported Sitting balance-Leahy Scale: Fair     Standing balance support: Bilateral upper extremity supported Standing balance-Leahy Scale: Poor Standing balance comment: reliant on external support                             Pertinent Vitals/Pain Pain Assessment: Faces Faces Pain Scale: No hurt    Home Living Family/patient expects to be discharged to:: Private residence Living Arrangements: Children(daughter) Available Help at Discharge: Family;Available PRN/intermittently             Additional Comments: pt unable to answer home set up information    Prior Function Level of Independence: Needs assistance   Gait / Transfers Assistance Needed: states she uses a walker     Comments: unable to state PLOF     Hand Dominance        Extremity/Trunk Assessment   Upper Extremity Assessment Upper Extremity Assessment: Generalized weakness    Lower Extremity Assessment Lower Extremity Assessment: Generalized weakness    Cervical / Trunk Assessment Cervical / Trunk Assessment: Kyphotic  Communication   Communication: No difficulties  Cognition Arousal/Alertness: Awake/alert Behavior During Therapy: WFL for tasks assessed/performed Overall Cognitive Status: Impaired/Different from baseline Area of Impairment: Attention;Orientation;Memory;Following commands;Awareness                 Orientation Level: Disoriented to;Time;Situation Current  Attention Level: Sustained Memory: Decreased short-term memory Following Commands: Follows one step commands inconsistently;Follows one step commands with increased time   Awareness: Intellectual   General Comments: Pt only oriented to self and  place. Needs frequent redirection to task. Answers questions with one word responses intermittently      General Comments      Exercises     Assessment/Plan    PT Assessment Patient needs continued PT services  PT Problem List Decreased strength;Decreased activity tolerance;Decreased balance;Decreased mobility;Decreased cognition;Decreased safety awareness       PT Treatment Interventions DME instruction;Gait training;Functional mobility training;Therapeutic activities;Therapeutic exercise;Balance training;Patient/family education    PT Goals (Current goals can be found in the Care Plan section)  Acute Rehab PT Goals Patient Stated Goal: unable to participate in goal setting PT Goal Formulation: Patient unable to participate in goal setting Time For Goal Achievement: 06/15/18 Potential to Achieve Goals: Fair    Frequency Min 3X/week   Barriers to discharge        Co-evaluation               AM-PAC PT "6 Clicks" Daily Activity  Outcome Measure Difficulty turning over in bed (including adjusting bedclothes, sheets and blankets)?: None Difficulty moving from lying on back to sitting on the side of the bed? : A Lot Difficulty sitting down on and standing up from a chair with arms (e.g., wheelchair, bedside commode, etc,.)?: A Little Help needed moving to and from a bed to chair (including a wheelchair)?: A Little Help needed walking in hospital room?: A Little Help needed climbing 3-5 steps with a railing? : A Lot 6 Click Score: 17    End of Session   Activity Tolerance: Patient tolerated treatment well Patient left: in chair;with call bell/phone within reach;with chair alarm set Nurse Communication: Mobility status PT Visit Diagnosis: Unsteadiness on feet (R26.81);Other abnormalities of gait and mobility (R26.89);Difficulty in walking, not elsewhere classified (R26.2)    Time: 6659-9357 PT Time Calculation (min) (ACUTE ONLY): 19 min   Charges:   PT  Evaluation $PT Eval Moderate Complexity: 1 Mod          Ellamae Sia, PT, DPT Acute Rehabilitation Services  Pager: [phone]  Willy Eddy 06/01/2018, 5:01 PM

## 2018-06-01 NOTE — Progress Notes (Signed)
New Admission Note:  Arrival Method: Stretcher Mental Orientation: Alert and oriented x 4 Telemetry: Box 05 Assessment: Completed Skin: MSAD to buttocks IV: NSL Pain: Denies  Tubes: N/A Safety Measures: Safety Fall Prevention Plan initiated.  Admission: Completed 5 M  Orientation: Patient has been orientated to the room, unit and the staff. Welcome booklet given.  Family: Dtr  Orders have been reviewed and implemented. Will continue to monitor the patient. Call light has been placed within reach and bed alarm has been activated.   Sima Matas BSN, RN  Phone Number: [phone]

## 2018-06-01 NOTE — Progress Notes (Signed)
Patient ID: Kelli Peterson, female   DOB: 1930/03/29, 84 y.o.   MRN: [id number] [facility] Progress Note   Assessment/ Plan:   1.  Acute kidney injury: Unclear baseline renal function but high risk for underlying chronic kidney disease given burden of comorbidities.  Creatinine gradually improving with intravenous fluids supportive of initial clinical suspicion of intravascular volume contraction.  I have switched fluid to include some sodium bicarbonate to help improve acidemia seen on labs earlier.  Fair urine output and without any acute indications for hemodialysis.  Urine electrolytes suggestive of ATN (possibly skewed by ACE inhibitor/diuretic).  Will maintain caution to avoid tipping her into volume overload/CHF exacerbation. 2.  Anion gap metabolic acidosis: Secondary to acute kidney injury, will correct partially with sodium bicarbonate as part of her intravenous fluids.  Monitor with improving renal function. 3.  Possible urinary tract infection: Being treated for urinary tract infection as an outpatient with cephalexin-recommend repeat cultures to decide on need to continue treatment. 4.  Hypertension: Monitor blood pressures while on intravenous fluids and need to restart additional/alternative antihypertensive therapy in addition to diltiazem such as hydralazine. 5.  History of diastolic congestive heart failure: Currently appears to be compensated, monitor cautiously with intravenous fluids.  Subjective:   Slept poorly overnight with hospitalization/events and currently trying to "catch up".  Denies any chest pain or shortness of breath.   Objective:   BP (!) 150/64 (BP Location: Left Arm)   Pulse 61   Temp 97.6 F (36.4 C) (Oral)   Resp 18   Ht 5' (1.524 m)   Wt 46.3 kg   SpO2 99%   BMI 19.92 kg/m   Intake/Output Summary (Last 24 hours) at 06/01/2018 1150 Last data filed at 06/01/2018 1100 Gross per 24 hour  Intake 3709.33 ml  Output 900 ml  Net 2809.33 ml    Weight change:   Physical Exam: Gen: Resting comfortably in bed, daughter at bedside CVS: Pulse regular rhythm, normal rate, S1 and S2 normal Resp: Anteriorly clear to auscultation, no rales/rhonchi Abd: Soft, flat, nontender Ext: Without lower extremity edema.  Skin tenting noted.  Imaging: Ct Renal Stone Study  Result Date: 05/31/2018 CLINICAL DATA:  Generalized weakness for the past 10 days. EXAM: CT ABDOMEN AND PELVIS WITHOUT CONTRAST TECHNIQUE: Multidetector CT imaging of the abdomen and pelvis was performed following the standard protocol without IV contrast. COMPARISON:  Abdominal MRI-02/28/2018; pelvic MRI-03/13/2018; right hip CT-02/15/2016 FINDINGS: The lack of intravenous contrast limits the ability to evaluate solid abdominal organs. Lower chest: Limited visualization of the lower thorax demonstrates minimal dependent subpleural ground-glass atelectasis. No pleural effusion. Cardiomegaly. Coronary artery calcifications. Exuberant calcifications within the mitral valve annulus. No pericardial effusion. Hepatobiliary: Normal hepatic contour. There is a jack shaped approximately 1.6 x 1.2 cm radiopaque stone within the neck of an otherwise normal-appearing gallbladder (coronal image 35, series 6), similar to abdominal MRI performed 02/28/2018. No gallbladder wall thickening or pericholecystic stranding. No ascites. Pancreas: Note is made of an approximately 1.0 cm hypoattenuating lesion within the mid body of the pancreas which is incompletely characterized on this noncontrast examination though appears morphologically similar to the 02/2018 examination and is again favored to represent a benign side branch IPMN. No peripancreatic stranding. Spleen: Slightly diminutive otherwise normal appearance. Adrenals/Urinary Tract: Previously characterized approximately 5.2 cm partially exophytic left-sided renal cyst is unchanged. Calcifications about the bilateral renal hilar favored to be vascular  in etiology. No renal stones. No urinary obstruction or perinephric stranding. Normal noncontrast appearance  the bilateral adrenal glands. Normal noncontrast appearance of the urinary bladder. Stomach/Bowel: Large colonic stool burden without evidence of enteric obstruction. Colonic diverticulosis without evidence of diverticulitis. Normal noncontrast appearance of the terminal ileum. The appendix is not visualized however there is no definitive pericecal inflammatory change on this noncontrast examination. No pneumoperitoneum, pneumatosis or portal venous gas. Vascular/Lymphatic: There is a large amount of atherosclerotic plaque within a tortuous and mildly ectatic abdominal aorta which measures approximately 2.5 cm in greatest oblique short axis coronal diameter (coronal image 34, series 6). No bulky retroperitoneal, mesenteric, pelvic or inguinal lymphadenopathy. Reproductive: Post hysterectomy.  No discrete adnexal lesion. Other: Regional soft tissues appear normal. Musculoskeletal: Old/healed fractures involving the right superior (image 60, series 2) and inferior pubic rami (image 69). Mixed lytic and sclerotic lesion involving the posterior superior aspect the right iliac crest (images 35 and 39, series 3) appears similar to recent pelvic MRI performed 03/13/2018 at which time are favored to represent either the sequela of prior bone infarct early Paget's disease. IMPRESSION: 1. No acute findings within the abdomen or pelvis. 2. Large colonic stool burden without evidence of enteric obstruction. 3. Cholelithiasis without evidence of superimposed acute cholecystitis on this noncontrast examination and similar to abdominal MRI performed 02/28/2018. If there is clinical concern for acute cholecystitis, further evaluation with right upper quadrant abdominal pain could be performed as indicated. 4. Approximately 1 cm hypoattenuating pancreatic lesion, similar to abdominal MRI performed 02/2018 at which time lesion  was favored to be benign however a 2 year follow-up MRI was recommended to ensure stability. 5.  Aortic Atherosclerosis (ICD10-I70.0). 6. Mild ectasia of the abdominal aorta measuring 2.5 cm in diameter. Recommend follow-up aortic ultrasound in 5 years. This recommendation follows ACR consensus guidelines: White Paper of the ACR Incidental Findings Committee II on Vascular Findings. J Am Coll Radiol 2013; 10:789-794. Electronically Signed   By: Sandi Mariscal M.D.   On: 05/31/2018 17:24    Labs: BMET Recent Labs  Lab 05/31/18 1527 05/31/18 2000 06/01/18 0315  NA 137  --  141  K 4.9  --  4.2  CL 102  --  110  CO2 17*  --  16*  GLUCOSE 191*  --  140*  BUN 155*  --  138*  CREATININE 9.67*  --  7.77*  CALCIUM 8.6*  --  8.0*  PHOS  --  6.5* 6.5*   CBC Recent Labs  Lab 05/31/18 1527 06/01/18 0315  WBC 15.0* 10.3  HGB 13.7 13.7  HCT 40.2 40.8  MCV 88.5 88.7  PLT 423* 371    Medications:    . atorvastatin  20 mg Oral Daily  . brimonidine  1 drop Right Eye BID  . diltiazem  120 mg Oral Daily  . dorzolamide  1 drop Both Eyes BID  . latanoprost  1 drop Both Eyes QHS  . senna  1 tablet Oral BID   Elmarie Shiley, MD 06/01/2018, 11:50 AM

## 2018-06-02 DIAGNOSIS — E43 Unspecified severe protein-calorie malnutrition: Secondary | ICD-10-CM

## 2018-06-02 LAB — URINE CULTURE: Culture: <10000 — AB

## 2018-06-02 LAB — BASIC METABOLIC PANEL
Anion gap: 12 (ref 5–15)
BUN: 104 mg/dL — ABNORMAL HIGH (ref 8–23)
CO2: 24 mmol/L (ref 22–32)
Calcium: 6.8 mg/dL — ABNORMAL LOW (ref 8.9–10.3)
Chloride: 103 mmol/L (ref 98–111)
Creatinine, Ser: 4.99 mg/dL — ABNORMAL HIGH (ref 0.44–1.00)
GFR calc Af Amer: 8 mL/min — ABNORMAL LOW (ref >60)
GFR calc non Af Amer: 7 mL/min — ABNORMAL LOW (ref >60)
Glucose, Bld: 102 mg/dL — ABNORMAL HIGH (ref 70–99)
Potassium: 3 mmol/L — ABNORMAL LOW (ref 3.5–5.1)
Sodium: 139 mmol/L (ref 135–145)

## 2018-06-02 LAB — PROTIME-INR
INR: 2.42
Prothrombin Time: 26.2 seconds — ABNORMAL HIGH (ref 11.4–15.2)

## 2018-06-02 LAB — T3, FREE: T3, Free: 1.6 pg/mL — ABNORMAL LOW (ref 2.0–4.4)

## 2018-06-02 MED ORDER — POTASSIUM CHLORIDE CRYS ER 20 MEQ PO TBCR
40.0000 meq | EXTENDED_RELEASE_TABLET | Freq: Two times a day (BID) | ORAL | Status: DC
  Administered 2018-06-02: 40 meq via ORAL
  Filled 2018-06-02: qty 2

## 2018-06-02 NOTE — NC FL2 (Signed)
[place] LEVEL OF CARE SCREENING TOOL     IDENTIFICATION  Patient Name: Kelli Peterson Birthdate: 01/31/1930 Sex: female Admission Date (Current Location): 05/31/2018  [facility] and [place] Number:  Herbalist and Address:  [facility] [place], [place] [address], [place], [place] [address]      Provider Number: [id number]  Attending Physician Name and Address:  Shelly Coss, MD  Relative Name and Phone Number:       Current Level of Care: Hospital Recommended Level of Care: [place] Prior Approval Number:    Date Approved/Denied:   PASRR Number: [id number]  Discharge Plan: SNF    Current Diagnoses: Patient Active Problem List   Diagnosis Date Noted  . Protein-calorie malnutrition, severe 06/02/2018  . AKI (acute kidney injury) ([place]) 05/31/2018  . Dehydration 05/31/2018  . Essential hypertension 05/31/2018  . Atrial fibrillation, chronic ([place]) 05/31/2018  . COPD (chronic obstructive pulmonary disease) ([place]) 05/31/2018  . Glaucoma 05/31/2018  . Hyperlipidemia 05/31/2018  . Acute renal failure (ARF) ([place]) 05/31/2018  . Constipation 05/31/2018  . Acute right hip pain   . Atrial fibrillation with RVR ([place])   . Pelvic fracture, closed, initial encounter 02/15/2016  . Fracture of pubic ramus ([place]) 02/15/2016    Orientation RESPIRATION BLADDER Height & Weight     Self, Time, Situation, Place  Normal Indwelling catheter Weight: 46.3 kg Height:  5' (152.4 cm)  BEHAVIORAL SYMPTOMS/MOOD NEUROLOGICAL BOWEL NUTRITION STATUS      Continent Diet(heart healthy)  AMBULATORY STATUS COMMUNICATION OF NEEDS Skin   Limited Assist Verbally Bruising                       Personal Care Assistance Level of Assistance  Bathing, Feeding, Dressing Bathing Assistance: Limited assistance Feeding assistance: Limited assistance Dressing Assistance: Limited assistance     Functional Limitations Info  Sight, Hearing, Speech  Sight Info: Impaired Hearing Info: Impaired Speech Info: Adequate    SPECIAL CARE FACTORS FREQUENCY  PT (By licensed PT), OT (By licensed OT)     PT Frequency: 5x/wk OT Frequency: 5x/wk            Contractures Contractures Info: Not present    Additional Factors Info  Code Status, Allergies, Psychotropic Code Status Info: full Allergies Info: PENICILLINS, ZOCOR SIMVASTATIN Psychotropic Info: none         Current Medications (06/02/2018):  This is the current hospital active medication list Current Facility-Administered Medications  Medication Dose Route Frequency Provider Last Rate Last Dose  . acetaminophen (TYLENOL) tablet 650 mg  650 mg Oral Q6H PRN Toy Baker, MD       Or  . acetaminophen (TYLENOL) suppository 650 mg  650 mg Rectal Q6H PRN Doutova, Anastassia, MD      . atorvastatin (LIPITOR) tablet 20 mg  20 mg Oral Daily Doutova, Anastassia, MD   20 mg at 06/02/18 0932  . bisacodyl (DULCOLAX) suppository 10 mg  10 mg Rectal Daily PRN Doutova, Anastassia, MD      . brimonidine (ALPHAGAN) 0.2 % ophthalmic solution 1 drop  1 drop Right Eye BID Toy Baker, MD   1 drop at 06/02/18 0933  . diltiazem (CARDIZEM CD) 24 hr capsule 120 mg  120 mg Oral Daily Doutova, Anastassia, MD   120 mg at 06/02/18 0932  . dorzolamide (TRUSOPT) 2 % ophthalmic solution 1 drop  1 drop Both Eyes BID Toy Baker, MD   1 drop at 06/02/18 0933  .  feeding supplement (ENSURE ENLIVE) (ENSURE ENLIVE) liquid 237 mL  237 mL Oral BID BM Shelly Coss, MD   237 mL at 06/02/18 0932  . latanoprost (XALATAN) 0.005 % ophthalmic solution 1 drop  1 drop Both Eyes QHS Doutova, Anastassia, MD   1 drop at 06/01/18 2126  . ondansetron (ZOFRAN) tablet 4 mg  4 mg Oral Q6H PRN Doutova, Anastassia, MD       Or  . ondansetron (ZOFRAN) injection 4 mg  4 mg Intravenous Q6H PRN Doutova, Anastassia, MD   4 mg at 06/02/18 0401  . polyethylene glycol (MIRALAX / GLYCOLAX) packet 17 g  17 g Oral Daily  PRN Doutova, Anastassia, MD      . senna (SENOKOT) tablet 8.6 mg  1 tablet Oral BID Toy Baker, MD   8.6 mg at 06/02/18 0932     Discharge Medications: Please see discharge summary for a list of discharge medications.  Relevant Imaging Results:  Relevant Lab Results:   Additional Information SSN: [id number]  Pollie Friar, RN

## 2018-06-02 NOTE — Clinical Social Work Note (Signed)
Clinical Social Work Assessment  Patient Details  Name: Kelli Peterson MRN: [id number] Date of Birth: June 30, 1930  Date of referral:  06/02/18               Reason for consult:  Discharge Planning, Facility Placement                Permission sought to share information with:  Facility Sport and exercise psychologist, Family Supports Permission granted to share information::     Name::     Debbe Odea  Agency::  SNFs  Relationship::  daughter  Contact Information:  [phone]  Housing/Transportation Living arrangements for the past 2 months:  Single Family Home Source of Information:  Adult Children Patient Interpreter Needed:  None Criminal Activity/Legal Involvement Pertinent to Current Situation/Hospitalization:  No - Comment as needed Significant Relationships:  Adult Children Lives with:  Adult Children Do you feel safe going back to the place where you live?  Yes Need for family participation in patient care:  Yes (Comment)  Care giving concerns: PT recommending SNF.   Social Worker assessment / plan: CSW met with patient's daughter, Arbie Cookey, at bedside. Patient asleep in bedside chair. Patient lives with her other daughter, [name]. CM had sent out initial SNF referrals. CSW provided bed offers available so far to [place]. Arbie Cookey indicated she will review with [name] for their choice of facility. Patient will need [facility] authorization before admitting to facility. CSW to follow and support.  Employment status:  Retired Research officer, political party) PT Recommendations:  [place] / Referral to community resources:  [place]  Patient/Family's Response to care: Patient's daughter appreciative of care.  Patient/Family's Understanding of and Emotional Response to Diagnosis, Current Treatment, and Prognosis: Patient's daughter with understanding of condition and agreeable to SNF.  Emotional Assessment Appearance:  Appears stated  age Attitude/Demeanor/Rapport:  Unable to Assess Affect (typically observed):  Unable to Assess Orientation:    Alcohol / Substance use:  Not Applicable Psych involvement (Current and /or in the community):  No (Comment)  Discharge Needs  Concerns to be addressed:  Discharge Planning Concerns, Care Coordination Readmission within the last 30 days:  No Current discharge risk:  Physical Impairment Barriers to Discharge:  Continued Medical Work up, [place], LCSW 06/02/2018, 3:11 PM

## 2018-06-02 NOTE — Progress Notes (Signed)
PROGRESS NOTE    Kelli Peterson  AST:[id number] DOB: August 26, 1930 DOA: 05/31/2018 PCP: Jani Gravel, MD   Brief Narrative: Patient is a 84 year old female with past medical history of A. fib on Coumadin, hyperlipidemia, chronic diastolic CHF who presents to the emergency department with complaints of generalized fatigue for last 2 weeks.  Patient was also noted to have diarrhea at home since last 2 weeks and was not able to drink or eat properly.  Patient was found to have severe acute kidney injury on presentation. Renal function has improved.  She is stable for discharge to skilled nursing facility after the bed is available.  Assessment & Plan:   Active Problems:   AKI (acute kidney injury) ([place])   Dehydration   Essential hypertension   Atrial fibrillation, chronic (HCC)   COPD (chronic obstructive pulmonary disease) (HCC)   Glaucoma   Hyperlipidemia   Acute renal failure (ARF) (HCC)   Constipation   Protein-calorie malnutrition, severe  Acute kidney injury: Likely secondary to dehydration from diarrhea.  Also presented with severe metabolic acidosis.  Kidney function improved with IV fluids.  IV fluids stopped today as per nephrology.  We will continue to monitor BMP.  Chronic A. [id number] vas score 5.  Currently rate is controlled currently INR is therapeutic.  Continue Cardizem.  Also on digoxin at home.  Hold digoxin for now until kidney function recovers.  COPD: Currently stable  Glaucoma: Resume home medications.  Hyperlipidemia: Continue Lipitor  Questionable UTI: No mention of dysuria or fever.  Urine culture did not show significant growth.  Generalized weakness/deconditioning:  PT evaluation done and recommended skilled nursing facility on discharge.  Social worker consulted.  Low TSH: Low TSH, low T3, normal T4.  Most likely associated  with euthyroid sick syndrome.  DVT prophylaxis: Warfarin Code Status: Full Family Communication: Discussed with daughter at the  bedside Disposition Plan: SNF.Waiting for bed   Consultants: Nephrology  Procedures: None  Antimicrobials: None  Subjective: Patient seen and examined the bedside this morning.  Hemodynamically stable.  No new complaints.  Renal function improving.  Objective: Vitals:   06/01/18 1712 06/01/18 2037 06/02/18 0552 06/02/18 1006  BP: (!) 147/63 (!) 147/73 (!) 152/65 (!) 155/88  Pulse: 65 80 99 73  Resp: 16 18 18 16   Temp: 97.6 F (36.4 C) 97.6 F (36.4 C) 97.7 F (36.5 C) 97.6 F (36.4 C)  TempSrc: Oral Oral Oral Oral  SpO2: 99% 98% 100% 100%  Weight:      Height:        Intake/Output Summary (Last 24 hours) at 06/02/2018 1316 Last data filed at 06/02/2018 4081 Gross per 24 hour  Intake 2433.33 ml  Output 2750 ml  Net -316.67 ml   Filed Weights   05/31/18 1522  Weight: 46.3 kg    Examination:  General exam: Appears calm and comfortable ,Not in distress, thin built elderly female, generalized weakness HEENT:PERRL,Oral mucosa moist, Ear/Nose normal on gross exam Respiratory system: Bilateral equal air entry, normal vesicular breath sounds, no wheezes or crackles  Cardiovascular system: S1 & S2 heard, RRR. No JVD, murmurs, rubs, gallops or clicks. Gastrointestinal system: Abdomen is nondistended, soft and nontender. No organomegaly or masses felt. Normal bowel sounds heard. Central nervous system: Alert . Extremities: No edema, no clubbing ,no cyanosis, distal peripheral pulses palpable. Skin: No rashes, lesions or ulcers,no icterus ,no pallor     Data Reviewed: I have personally reviewed following labs and imaging studies  CBC: Recent Labs  Lab 05/31/18  1527 06/01/18 0315  WBC 15.0* 10.3  HGB 13.7 13.7  HCT 40.2 40.8  MCV 88.5 88.7  PLT 423* 284   Basic Metabolic Panel: Recent Labs  Lab 05/31/18 1527 05/31/18 2000 06/01/18 0315 06/02/18 0419  NA 137  --  141 139  K 4.9  --  4.2 3.0*  CL 102  --  110 103  CO2 17*  --  16* 24  GLUCOSE 191*  --   140* 102*  BUN 155*  --  138* 104*  CREATININE 9.67*  --  7.77* 4.99*  CALCIUM 8.6*  --  8.0* 6.8*  MG  --  2.7* 2.7*  --   PHOS  --  6.5* 6.5*  --    GFR: Estimated Creatinine Clearance: 5.8 mL/min (A) (by C-G formula based on SCr of 4.99 mg/dL (H)). Liver Function Tests: Recent Labs  Lab 05/31/18 2000 06/01/18 0315  AST 10* 12*  ALT 16 15  ALKPHOS 73 73  BILITOT 0.9 0.6  PROT 7.7 7.2  ALBUMIN 3.0* 3.1*   No results for input(s): LIPASE, AMYLASE in the last 168 hours. No results for input(s): AMMONIA in the last 168 hours. Coagulation Profile: Recent Labs  Lab 05/31/18 1527 06/01/18 0315 06/02/18 0933  INR 3.13 3.38 2.42   Cardiac Enzymes: No results for input(s): CKTOTAL, CKMB, CKMBINDEX, TROPONINI in the last 168 hours. BNP (last 3 results) No results for input(s): PROBNP in the last 8760 hours. HbA1C: No results for input(s): HGBA1C in the last 72 hours. CBG: No results for input(s): GLUCAP in the last 168 hours. Lipid Profile: No results for input(s): CHOL, HDL, LDLCALC, TRIG, CHOLHDL, LDLDIRECT in the last 72 hours. Thyroid Function Tests: Recent Labs    06/01/18 0315 06/01/18 1457  TSH 0.333*  --   FREET4  --  0.96  T3FREE  --  1.6*   Anemia Panel: No results for input(s): VITAMINB12, FOLATE, FERRITIN, TIBC, IRON, RETICCTPCT in the last 72 hours. Sepsis Labs: No results for input(s): PROCALCITON, LATICACIDVEN in the last 168 hours.  Recent Results (from the past 240 hour(s))  Urine culture     Status: Abnormal   Collection Time: 05/31/18  5:13 PM  Result Value Ref Range Status   Specimen Description URINE, CLEAN CATCH  Final   Special Requests NONE  Final   Culture (A)  Final    <10,000 COLONIES/mL INSIGNIFICANT GROWTH Performed at [facility], [address] [place], [place] [address]    Report Status 06/02/2018 FINAL  Final         Radiology Studies: Ct Renal Stone Study  Result Date: 05/31/2018 CLINICAL DATA:  Generalized  weakness for the past 10 days. EXAM: CT ABDOMEN AND PELVIS WITHOUT CONTRAST TECHNIQUE: Multidetector CT imaging of the abdomen and pelvis was performed following the standard protocol without IV contrast. COMPARISON:  Abdominal MRI-02/28/2018; pelvic MRI-03/13/2018; right hip CT-02/15/2016 FINDINGS: The lack of intravenous contrast limits the ability to evaluate solid abdominal organs. Lower chest: Limited visualization of the lower thorax demonstrates minimal dependent subpleural ground-glass atelectasis. No pleural effusion. Cardiomegaly. Coronary artery calcifications. Exuberant calcifications within the mitral valve annulus. No pericardial effusion. Hepatobiliary: Normal hepatic contour. There is a jack shaped approximately 1.6 x 1.2 cm radiopaque stone within the neck of an otherwise normal-appearing gallbladder (coronal image 35, series 6), similar to abdominal MRI performed 02/28/2018. No gallbladder wall thickening or pericholecystic stranding. No ascites. Pancreas: Note is made of an approximately 1.0 cm hypoattenuating lesion within the mid body  of the pancreas which is incompletely characterized on this noncontrast examination though appears morphologically similar to the 02/2018 examination and is again favored to represent a benign side branch IPMN. No peripancreatic stranding. Spleen: Slightly diminutive otherwise normal appearance. Adrenals/Urinary Tract: Previously characterized approximately 5.2 cm partially exophytic left-sided renal cyst is unchanged. Calcifications about the bilateral renal hilar favored to be vascular in etiology. No renal stones. No urinary obstruction or perinephric stranding. Normal noncontrast appearance the bilateral adrenal glands. Normal noncontrast appearance of the urinary bladder. Stomach/Bowel: Large colonic stool burden without evidence of enteric obstruction. Colonic diverticulosis without evidence of diverticulitis. Normal noncontrast appearance of the terminal  ileum. The appendix is not visualized however there is no definitive pericecal inflammatory change on this noncontrast examination. No pneumoperitoneum, pneumatosis or portal venous gas. Vascular/Lymphatic: There is a large amount of atherosclerotic plaque within a tortuous and mildly ectatic abdominal aorta which measures approximately 2.5 cm in greatest oblique short axis coronal diameter (coronal image 34, series 6). No bulky retroperitoneal, mesenteric, pelvic or inguinal lymphadenopathy. Reproductive: Post hysterectomy.  No discrete adnexal lesion. Other: Regional soft tissues appear normal. Musculoskeletal: Old/healed fractures involving the right superior (image 60, series 2) and inferior pubic rami (image 69). Mixed lytic and sclerotic lesion involving the posterior superior aspect the right iliac crest (images 35 and 39, series 3) appears similar to recent pelvic MRI performed 03/13/2018 at which time are favored to represent either the sequela of prior bone infarct early Paget's disease. IMPRESSION: 1. No acute findings within the abdomen or pelvis. 2. Large colonic stool burden without evidence of enteric obstruction. 3. Cholelithiasis without evidence of superimposed acute cholecystitis on this noncontrast examination and similar to abdominal MRI performed 02/28/2018. If there is clinical concern for acute cholecystitis, further evaluation with right upper quadrant abdominal pain could be performed as indicated. 4. Approximately 1 cm hypoattenuating pancreatic lesion, similar to abdominal MRI performed 02/2018 at which time lesion was favored to be benign however a 2 year follow-up MRI was recommended to ensure stability. 5.  Aortic Atherosclerosis (ICD10-I70.0). 6. Mild ectasia of the abdominal aorta measuring 2.5 cm in diameter. Recommend follow-up aortic ultrasound in 5 years. This recommendation follows ACR consensus guidelines: White Paper of the ACR Incidental Findings Committee II on Vascular  Findings. J Am Coll Radiol 2013; 10:789-794. Electronically Signed   By: Sandi Mariscal M.D.   On: 05/31/2018 17:24        Scheduled Meds: . atorvastatin  20 mg Oral Daily  . brimonidine  1 drop Right Eye BID  . diltiazem  120 mg Oral Daily  . dorzolamide  1 drop Both Eyes BID  . feeding supplement (ENSURE ENLIVE)  237 mL Oral BID BM  . latanoprost  1 drop Both Eyes QHS  . senna  1 tablet Oral BID   Continuous Infusions:    LOS: 2 days    Time spent: 25 mins.More than 50% of that time was spent in counseling and/or coordination of care.      Shelly Coss, MD [facility] Pager [phone]  If 7PM-7AM, please contact night-coverage [email or web address] Password [email or web address] 06/02/2018, 1:16 PM

## 2018-06-02 NOTE — Progress Notes (Signed)
Subjective: Interval History: no complaints today, is not in pain, no SOB.  Appetite still not great per sister had some nausea last night but no vomiting.  Objective: Vital signs in last 24 hours: Temp:  [97.6 F (36.4 C)-97.7 F (36.5 C)] 97.7 F (36.5 C) (08/30 0552) Pulse Rate:  [65-99] 99 (08/30 0552) Resp:  [16-18] 18 (08/30 0552) BP: (147-152)/(63-73) 152/65 (08/30 0552) SpO2:  [98 %-100 %] 100 % (08/30 0552) Weight change:   Intake/Output from previous day: 08/29 0701 - 08/30 0700 In: 2797.8 [P.O.:660; I.V.:2137.8] Out: 2750 [Urine:2750] Intake/Output this shift: No intake/output data recorded.  Cardiac: normal rate and irregularly irregular rhythm, clear s1 and s2 Pulmonary: anterior breath sounds CTAB, not in distress Abdominal: non distended abdomen, soft and nontender Extremities: no LE edema Mental status: Alert, conversant, flat affect  Lab Results: Recent Labs    05/31/18 1527 06/01/18 0315  WBC 15.0* 10.3  HGB 13.7 13.7  HCT 40.2 40.8  PLT 423* 371   BMET:  Recent Labs    06/01/18 0315 06/02/18 0419  NA 141 139  K 4.2 3.0*  CL 110 103  CO2 16* 24  GLUCOSE 140* 102*  BUN 138* 104*  CREATININE 7.77* 4.99*  CALCIUM 8.0* 6.8*   No results for input(s): PTH in the last 72 hours. Iron Studies: No results for input(s): IRON, TIBC, TRANSFERRIN, FERRITIN in the last 72 hours. CBG (last 3)  No results for input(s): GLUCAP in the last 72 hours.   Studies/Results: Ct Renal Stone Study  Result Date: 05/31/2018 CLINICAL DATA:  Generalized weakness for the past 10 days. EXAM: CT ABDOMEN AND PELVIS WITHOUT CONTRAST TECHNIQUE: Multidetector CT imaging of the abdomen and pelvis was performed following the standard protocol without IV contrast. COMPARISON:  Abdominal MRI-02/28/2018; pelvic MRI-03/13/2018; right hip CT-02/15/2016 FINDINGS: The lack of intravenous contrast limits the ability to evaluate solid abdominal organs. Lower chest: Limited visualization  of the lower thorax demonstrates minimal dependent subpleural ground-glass atelectasis. No pleural effusion. Cardiomegaly. Coronary artery calcifications. Exuberant calcifications within the mitral valve annulus. No pericardial effusion. Hepatobiliary: Normal hepatic contour. There is a jack shaped approximately 1.6 x 1.2 cm radiopaque stone within the neck of an otherwise normal-appearing gallbladder (coronal image 35, series 6), similar to abdominal MRI performed 02/28/2018. No gallbladder wall thickening or pericholecystic stranding. No ascites. Pancreas: Note is made of an approximately 1.0 cm hypoattenuating lesion within the mid body of the pancreas which is incompletely characterized on this noncontrast examination though appears morphologically similar to the 02/2018 examination and is again favored to represent a benign side branch IPMN. No peripancreatic stranding. Spleen: Slightly diminutive otherwise normal appearance. Adrenals/Urinary Tract: Previously characterized approximately 5.2 cm partially exophytic left-sided renal cyst is unchanged. Calcifications about the bilateral renal hilar favored to be vascular in etiology. No renal stones. No urinary obstruction or perinephric stranding. Normal noncontrast appearance the bilateral adrenal glands. Normal noncontrast appearance of the urinary bladder. Stomach/Bowel: Large colonic stool burden without evidence of enteric obstruction. Colonic diverticulosis without evidence of diverticulitis. Normal noncontrast appearance of the terminal ileum. The appendix is not visualized however there is no definitive pericecal inflammatory change on this noncontrast examination. No pneumoperitoneum, pneumatosis or portal venous gas. Vascular/Lymphatic: There is a large amount of atherosclerotic plaque within a tortuous and mildly ectatic abdominal aorta which measures approximately 2.5 cm in greatest oblique short axis coronal diameter (coronal image 34, series 6). No  bulky retroperitoneal, mesenteric, pelvic or inguinal lymphadenopathy. Reproductive: Post hysterectomy.  No discrete adnexal  lesion. Other: Regional soft tissues appear normal. Musculoskeletal: Old/healed fractures involving the right superior (image 60, series 2) and inferior pubic rami (image 69). Mixed lytic and sclerotic lesion involving the posterior superior aspect the right iliac crest (images 35 and 39, series 3) appears similar to recent pelvic MRI performed 03/13/2018 at which time are favored to represent either the sequela of prior bone infarct early Paget's disease. IMPRESSION: 1. No acute findings within the abdomen or pelvis. 2. Large colonic stool burden without evidence of enteric obstruction. 3. Cholelithiasis without evidence of superimposed acute cholecystitis on this noncontrast examination and similar to abdominal MRI performed 02/28/2018. If there is clinical concern for acute cholecystitis, further evaluation with right upper quadrant abdominal pain could be performed as indicated. 4. Approximately 1 cm hypoattenuating pancreatic lesion, similar to abdominal MRI performed 02/2018 at which time lesion was favored to be benign however a 2 year follow-up MRI was recommended to ensure stability. 5.  Aortic Atherosclerosis (ICD10-I70.0). 6. Mild ectasia of the abdominal aorta measuring 2.5 cm in diameter. Recommend follow-up aortic ultrasound in 5 years. This recommendation follows ACR consensus guidelines: White Paper of the ACR Incidental Findings Committee II on Vascular Findings. J Am Coll Radiol 2013; 10:789-794. Electronically Signed   By: Sandi Mariscal M.D.   On: 05/31/2018 17:24    I have reviewed the patient's current medications.  Assessment/Plan: 1.  AKI on CKD?: Unclear baseline renal function but high risk for underlying chronic kidney disease given burden of comorbidities.  Creatinine gradually improving with intravenous fluids.  Responded well to bicarb. Blood pressure  normalizing  -will d/c bicarb this am, watch urine output may need some gentle LR -potassium will correct on it's own now that bicarb d/ced already got 35meq this am from primary d/ced afternoon dose.    2.  Anion gap metabolic acidosis: gap is closed, has corrected with bicarb added to fluids and improvement in renal function.  -d/c bicarb drip so we do not overcorrect  3.  Possible urinary tract infection: culture is negative, no abx  4.  Hypertension: Adjust therapeutic regimen once pt stabilizes off IV fluids  5.  History of diastolic congestive heart failure: Currently appears euvolemic, was even over last 24 hours.    LOS: 2 days   Brandon Yovany Clock 06/02/2018,8:20 AM

## 2018-06-02 NOTE — Care Management Note (Signed)
Case Management Note  Patient Details  Name: Kelli Peterson MRN: [id number] Date of Birth: 1930-04-24  Subjective/Objective:    Pt admitted with AKI. She is from home with her daughter, Kelli Peterson. Pt used walker and cane at home.                 Action/Plan: CM met with the patient and her daughter, Kelli Peterson. CM inquired about rehab prior to return to home with [place]. Kelli Peterson was in agreement and stated she and Kelli Peterson had discussed it and both want her to have rehab. Pt was sleeping during the conversation. Kelli Peterson states the patient has been to [facility] in the past but is agreeable to her being faxed out in [place]. CM provided Kelli Peterson with list of [facility] and she will share this information with [place]. CM updated CSW. CM following.   Expected Discharge Date:  06/05/18               Expected Discharge Plan:  Skilled Nursing Facility  In-House Referral:  Clinical Social Work  Discharge planning Services  CM Consult  Post Acute Care Choice:    Choice offered to:     DME Arranged:    DME Agency:     HH Arranged:    [place] Agency:     Status of Service:  In process, will continue to follow  If discussed at Long Length of Stay Meetings, dates discussed:    Additional Comments:  Pollie Friar, RN 06/02/2018, 12:01 PM

## 2018-06-02 NOTE — Progress Notes (Signed)
Physical Therapy Treatment Patient Details Name: Kelli Peterson MRN: [id number] DOB: 1929-12-02 Today's Date: 06/02/2018    History of Present Illness Pt is a 84 y.o. F with significant PMH of a.fib on Coumadin, hyperlipidemia, chronic diastolic CHF who presents to ED with complaints of genealized fatigue for last 2 weeks. Also noted to have diarrhea at home since last 2 seeks and not able to drink or eat properly. Found to have severe acute kidney injury on presentation.    PT Comments    Session focused on progressing activity tolerance and mobility. Increased ambulation distance to 30 feet using walker and min guard assist. Discussed rehab with pt daughter and she in agreement that patient could benefit from SNF services at discharge to maximize functional independence and decrease caregiver burden.    Follow Up Recommendations  SNF;Supervision/Assistance - 24 hour     Equipment Recommendations  None recommended by PT    Recommendations for Other Services       Precautions / Restrictions Precautions Precautions: Fall Restrictions Weight Bearing Restrictions: No    Mobility  Bed Mobility Overal bed mobility: Needs Assistance Bed Mobility: Sit to Supine     Supine to sit: Supervision Sit to supine: Supervision   General bed mobility comments: increased time required, pt able to pull herself up in bed independently using bed rails  Transfers Overall transfer level: Needs assistance Equipment used: Rolling walker (2 wheeled) Transfers: Sit to/from [facility] Sit to Stand: Min guard            Ambulation/Gait Ambulation/Gait assistance: Min guard Gait Distance (Feet): 30 Feet Assistive device: Rolling walker (2 wheeled) Gait Pattern/deviations: Step-through pattern;Trunk flexed;Decreased stride length Gait velocity: decr Gait velocity interpretation: <1.31 ft/sec, indicative of household ambulator General Gait Details: Patient with slow speed  but overall no overt LOB. Verbal cueing for walker proximity    Stairs             Wheelchair Mobility    Modified Rankin (Stroke Patients Only)       Balance Overall balance assessment: Needs assistance Sitting-balance support: Bilateral upper extremity supported;Feet supported Sitting balance-Leahy Scale: Fair     Standing balance support: Bilateral upper extremity supported Standing balance-Leahy Scale: Fair                              Cognition Arousal/Alertness: Awake/alert Behavior During Therapy: WFL for tasks assessed/performed Overall Cognitive Status: History of cognitive impairments - at baseline Area of Impairment: Problem solving;Memory                 Orientation Level: (Pt oriented x4) Current Attention Level: Sustained Memory: Decreased short-term memory Following Commands: Follows multi-step commands inconsistently   Awareness: Emergent Problem Solving: Slow processing;Decreased initiation;Difficulty sequencing General Comments: Daughter reports pt is at baseline with cognition      Exercises      General Comments        Pertinent Vitals/Pain Pain Assessment: No/denies pain    Home Living                      Prior Function            PT Goals (current goals can now be found in the care plan section) Acute Rehab PT Goals Patient Stated Goal: stated by daughter "for mom to get stronger" Potential to Achieve Goals: Fair Progress towards PT goals: Progressing toward goals  Frequency    Min 3X/week      PT Plan Current plan remains appropriate    Co-evaluation              AM-PAC PT "6 Clicks" Daily Activity  Outcome Measure  Difficulty turning over in bed (including adjusting bedclothes, sheets and blankets)?: None Difficulty moving from lying on back to sitting on the side of the bed? : A Little Difficulty sitting down on and standing up from a chair with arms (e.g., wheelchair,  bedside commode, etc,.)?: A Little Help needed moving to and from a bed to chair (including a wheelchair)?: A Little Help needed walking in hospital room?: A Little Help needed climbing 3-5 steps with a railing? : A Lot 6 Click Score: 18    End of Session Equipment Utilized During Treatment: Gait belt Activity Tolerance: Patient tolerated treatment well Patient left: in chair;with call bell/phone within reach;with chair alarm set Nurse Communication: Mobility status PT Visit Diagnosis: Unsteadiness on feet (R26.81);Other abnormalities of gait and mobility (R26.89);Difficulty in walking, not elsewhere classified (R26.2)     Time: 6837-2902 PT Time Calculation (min) (ACUTE ONLY): 15 min  Charges:  $Gait Training: 8-22 mins                    Ellamae Sia, PT, DPT Acute Rehabilitation Services  Pager: [phone]    Willy Eddy 06/02/2018, 5:06 PM

## 2018-06-02 NOTE — Evaluation (Signed)
Occupational Therapy Evaluation Patient Details Name: Kelli Peterson MRN: [id number] DOB: 25-Jan-1930 Today's Date: 06/02/2018    History of Present Illness Pt is a 84 y.o. F iwth significant PMH of a.fib on Coumadin, hyperlipidemia, chronic diastolic CHF who presents to ED with complaints of genealized fatigue for last 2 weeks. Also noted to have diarrhea at home since last 2 seeks and not able to drink or eat properly. Found to have severe acute kidney injury on presentation.   Clinical Impression   Pt admitted with fatigue and malnutrition and now presenting with deficits in functional activity tolerance that limits her safety with ADL transfers. Per daughter and pt report, pt was living with one of her daughters. She remained on the first floor of the house, used a BSC, and completed sponge baths with set up. Pt was left alone for brief periods of time during which she would usually sleep. Daughter feels she is at baseline cognitively, just weak physically. Pt would benefit from skilled OT services to maximize return to PLOF and reduce caregiver burden, as well as a brief SNF stay.     Follow Up Recommendations  SNF;Supervision/Assistance - 24 hour(family agrees to brief SNF stay)    Equipment Recommendations  None recommended by OT    Recommendations for Other Services PT consult;Speech consult     Precautions / Restrictions Precautions Precautions: Fall Restrictions Weight Bearing Restrictions: No      Mobility Bed Mobility Overal bed mobility: Needs Assistance Bed Mobility: Sit to Supine     Supine to sit: Supervision Sit to supine: Supervision   General bed mobility comments: increased time required, pt able to pull herself up in bed independently using bed rails  Transfers Overall transfer level: Needs assistance Equipment used: Rolling walker (2 wheeled) Transfers: Sit to/from [facility] Sit to Stand: Supervision Stand pivot transfers:  Supervision            Balance Overall balance assessment: Needs assistance Sitting-balance support: Bilateral upper extremity supported;Feet supported Sitting balance-Leahy Scale: Fair     Standing balance support: Bilateral upper extremity supported Standing balance-Leahy Scale: Fair                             ADL either performed or assessed with clinical judgement   ADL Overall ADL's : Needs assistance/impaired Eating/Feeding: Modified independent   Grooming: Wash/dry hands;Supervision/safety;Standing   Upper Body Bathing: Min guard;Sitting Upper Body Bathing Details (indicate cue type and reason): Pt with increased levels of fatigue  Lower Body Bathing: Minimal assistance;Sit to/from stand   Upper Body Dressing : Min guard;Sitting   Lower Body Dressing: Minimal assistance;Sit to/from stand   Toilet Transfer: Min guard;RW;Ambulation   Toileting- Water quality scientist and Hygiene: Min guard;Sit to/from stand   Tub/ Banker: (N/A)   Functional mobility during ADLs: Min guard;Rolling walker General ADL Comments: pt reported dizziness throughout session with all VSS     Vision Baseline Vision/History: No visual deficits Patient Visual Report: No change from baseline Vision Assessment?: No apparent visual deficits     Perception     Praxis      Pertinent Vitals/Pain Pain Assessment: No/denies pain     Hand Dominance Right   Extremity/Trunk Assessment Upper Extremity Assessment Upper Extremity Assessment: Generalized weakness   Lower Extremity Assessment Lower Extremity Assessment: Defer to PT evaluation   Cervical / Trunk Assessment Cervical / Trunk Assessment: Kyphotic(pt self corrected posture upon standing)   Communication Communication  Communication: No difficulties   Cognition Arousal/Alertness: Awake/alert Behavior During Therapy: WFL for tasks assessed/performed Overall Cognitive Status: History of cognitive impairments  - at baseline Area of Impairment: Problem solving;Memory                 Orientation Level: (Pt oriented x4) Current Attention Level: Sustained Memory: Decreased short-term memory Following Commands: Follows multi-step commands inconsistently   Awareness: Emergent Problem Solving: Slow processing;Decreased initiation;Difficulty sequencing General Comments: Daughter reports pt is at baseline with cognition              Home Living Family/patient expects to be discharged to:: Private residence Living Arrangements: Children Available Help at Discharge: Family;Available PRN/intermittently Type of Home: House Home Access: Level entry     Home Layout: Two level;Able to live on main level with bedroom/bathroom Alternate Level Stairs-Number of Steps: 12   Bathroom Shower/Tub: None(None downstairs, pt does bed baths)   Bathroom Toilet: Standard Bathroom Accessibility: Yes How Accessible: Accessible via walker Home Equipment: Walker - 2 wheels;Walker - 4 wheels;Bedside commode   Additional Comments: Daughter present verified all information      Prior Functioning/Environment Level of Independence: Needs assistance  Gait / Transfers Assistance Needed: Used a cane or walker in the house. Used a rollator in the community ADL's / Homemaking Assistance Needed: Daughter would set up most ADLs. Pt occasionally cooked dinner with daughter present. Family loves to travel together Communication / Swallowing Assistance Needed: None          OT Problem List: Decreased strength;Decreased activity tolerance;Impaired balance (sitting and/or standing);Decreased safety awareness;Cardiopulmonary status limiting activity      OT Treatment/Interventions: Self-care/ADL training;Therapeutic exercise;Energy conservation;DME and/or AE instruction;Therapeutic activities;Cognitive remediation/compensation;Patient/family education;Balance training    OT Goals(Current goals can be found in the  care plan section) Acute Rehab OT Goals Patient Stated Goal: stated by daughter "for mom to get stronger" OT Goal Formulation: With patient/family Time For Goal Achievement: 06/12/18 Potential to Achieve Goals: Fair  OT Frequency: Min 2X/week    AM-PAC PT "6 Clicks" Daily Activity     Outcome Measure Help from another person eating meals?: None Help from another person taking care of personal grooming?: None Help from another person toileting, which includes using toliet, bedpan, or urinal?: A Little Help from another person bathing (including washing, rinsing, drying)?: A Little Help from another person to put on and taking off regular upper body clothing?: A Little Help from another person to put on and taking off regular lower body clothing?: A Little 6 Click Score: 20   End of Session Equipment Utilized During Treatment: Gait belt;Rolling walker Nurse Communication: Mobility status  Activity Tolerance: Patient tolerated treatment well Patient left: in bed;with bed alarm set;with family/visitor present;with call bell/phone within reach  OT Visit Diagnosis: Unsteadiness on feet (R26.81);Muscle weakness (generalized) (M62.81)                Time: 6808-8110 OT Time Calculation (min): 30 min Charges:  OT General Charges $OT Visit: 1 Visit OT Evaluation $OT Eval Low Complexity: 1 Low OT Treatments $Self Care/Home Management : 8-22 mins  Curtis Sites OTR/L 06/02/2018, 2:11 PM

## 2018-06-03 LAB — BASIC METABOLIC PANEL
Anion gap: 12 (ref 5–15)
BUN: 101 mg/dL — ABNORMAL HIGH (ref 8–23)
CO2: 25 mmol/L (ref 22–32)
Calcium: 8.3 mg/dL — ABNORMAL LOW (ref 8.9–10.3)
Chloride: 105 mmol/L (ref 98–111)
Creatinine, Ser: 4.45 mg/dL — ABNORMAL HIGH (ref 0.44–1.00)
GFR calc Af Amer: 9 mL/min — ABNORMAL LOW (ref >60)
GFR calc non Af Amer: 8 mL/min — ABNORMAL LOW (ref >60)
Glucose, Bld: 118 mg/dL — ABNORMAL HIGH (ref 70–99)
Potassium: 3.6 mmol/L (ref 3.5–5.1)
Sodium: 142 mmol/L (ref 135–145)

## 2018-06-03 LAB — PROTIME-INR
INR: 1.73
Prothrombin Time: 20.1 seconds — ABNORMAL HIGH (ref 11.4–15.2)

## 2018-06-03 LAB — DIGOXIN LEVEL: Digoxin Level: 1.6 ng/mL (ref 0.8–2.0)

## 2018-06-03 MED ORDER — WARFARIN SODIUM 3 MG PO TABS
3.0000 mg | ORAL_TABLET | Freq: Once | ORAL | Status: AC
  Administered 2018-06-03: 3 mg via ORAL
  Filled 2018-06-03: qty 1

## 2018-06-03 MED ORDER — LACTATED RINGERS IV SOLN
INTRAVENOUS | Status: AC
  Administered 2018-06-03 – 2018-06-04 (×2): via INTRAVENOUS

## 2018-06-03 MED ORDER — WARFARIN - PHARMACIST DOSING INPATIENT
Freq: Every day | Status: DC
  Administered 2018-06-06: 17:00:00

## 2018-06-03 MED ORDER — HYDRALAZINE HCL 25 MG PO TABS
25.0000 mg | ORAL_TABLET | Freq: Two times a day (BID) | ORAL | Status: DC
  Administered 2018-06-03 – 2018-06-06 (×7): 25 mg via ORAL
  Filled 2018-06-03 (×8): qty 1

## 2018-06-03 NOTE — Progress Notes (Signed)
PROGRESS NOTE    Kelli Peterson  MEQ:[id number] DOB: 05/12/1930 DOA: 05/31/2018 PCP: Jani Gravel, MD   Brief Narrative: Patient is a 84 year old female with past medical history of A. fib on Coumadin, hyperlipidemia, chronic diastolic CHF who presents to the emergency department with complaints of generalized fatigue for last 2 weeks.  Patient was also noted to have diarrhea at home since last 2 weeks and was not able to drink or eat properly.  Patient was found to have severe acute kidney injury on presentation. Renal function has improved with iv fluids.  Nephrology is following.  She is stable for discharge to skilled nursing facility as soon as the bed is available.  We will continue with gentle IV fluids until she is discharged.  Assessment & Plan:   Active Problems:   AKI (acute kidney injury) ([place])   Dehydration   Essential hypertension   Atrial fibrillation, chronic (HCC)   COPD (chronic obstructive pulmonary disease) (HCC)   Glaucoma   Hyperlipidemia   Acute renal failure (ARF) (HCC)   Constipation   Protein-calorie malnutrition, severe  Acute kidney injury: Likely secondary to dehydration from diarrhea.  Also presented with severe metabolic acidosis.  Kidney function improved with IV fluids.  IV fluids continued on slow rate as per nephrology.  We will continue to monitor BMP.  Chronic A. WLN:[id number] vas score 5.  Currently rate is controlled currently INR is therapeutic.  Continue Cardizem.  Also on digoxin at home.  Hold digoxin for now until kidney function recovers.  COPD: Currently stable  Glaucoma: Resume home medications.  Hyperlipidemia: Continue Lipitor  Questionable UTI: No mention of dysuria or fever.  Urine culture did not show significant growth.  Generalized weakness/deconditioning:  PT evaluation done and recommended skilled nursing facility on discharge.  Social worker consulted.  Low TSH: Low TSH, low T3, normal T4.  Most likely associated  with  euthyroid sick syndrome.  History of diastolic heart failure: Currently stable.  DVT prophylaxis: Warfarin Code Status: Full Family Communication: Discussed with daughter at the bedside Disposition Plan: SNF.Waiting for bed   Consultants: Nephrology  Procedures: None  Antimicrobials: None  Subjective: Patient seen and examined the bedside this morning.  Hemodynamically stable.  No new complaints.    Objective: Vitals:   06/02/18 1741 06/02/18 2140 06/03/18 0501 06/03/18 0839  BP: (!) 159/86 (!) 169/68 (!) 151/93 (!) 143/104  Pulse: 80 80 79 88  Resp: 18 16 18 17   Temp: 98 F (36.7 C) 98 F (36.7 C) (!) 97.5 F (36.4 C) 97.6 F (36.4 C)  TempSrc: Oral Oral Oral Oral  SpO2: 98% 98% 97% 98%  Weight:      Height:        Intake/Output Summary (Last 24 hours) at 06/03/2018 1332 Last data filed at 06/03/2018 4174 Gross per 24 hour  Intake 540 ml  Output 3400 ml  Net -2860 ml   Filed Weights   05/31/18 1522  Weight: 46.3 kg    Examination:  General exam: Appears calm and comfortable ,Not in distress, thin built elderly female, generalized weakness HEENT:PERRL,Oral mucosa moist, Ear/Nose normal on gross exam Respiratory system: Bilateral equal air entry, normal vesicular breath sounds, no wheezes or crackles  Cardiovascular system: S1 & S2 heard, RRR. No JVD, murmurs, rubs, gallops or clicks. Gastrointestinal system: Abdomen is nondistended, soft and nontender. No organomegaly or masses felt. Normal bowel sounds heard. Central nervous system: Alert . Extremities: No edema, no clubbing ,no cyanosis, distal peripheral pulses palpable. Skin:  No rashes, lesions or ulcers,no icterus ,no pallor     Data Reviewed: I have personally reviewed following labs and imaging studies  CBC: Recent Labs  Lab 05/31/18 1527 06/01/18 0315  WBC 15.0* 10.3  HGB 13.7 13.7  HCT 40.2 40.8  MCV 88.5 88.7  PLT 423* 937   Basic Metabolic Panel: Recent Labs  Lab 05/31/18 1527  05/31/18 2000 06/01/18 0315 06/02/18 0419 06/03/18 0628  NA 137  --  141 139 142  K 4.9  --  4.2 3.0* 3.6  CL 102  --  110 103 105  CO2 17*  --  16* 24 25  GLUCOSE 191*  --  140* 102* 118*  BUN 155*  --  138* 104* 101*  CREATININE 9.67*  --  7.77* 4.99* 4.45*  CALCIUM 8.6*  --  8.0* 6.8* 8.3*  MG  --  2.7* 2.7*  --   --   PHOS  --  6.5* 6.5*  --   --    GFR: Estimated Creatinine Clearance: 6.5 mL/min (A) (by C-G formula based on SCr of 4.45 mg/dL (H)). Liver Function Tests: Recent Labs  Lab 05/31/18 2000 06/01/18 0315  AST 10* 12*  ALT 16 15  ALKPHOS 73 73  BILITOT 0.9 0.6  PROT 7.7 7.2  ALBUMIN 3.0* 3.1*   No results for input(s): LIPASE, AMYLASE in the last 168 hours. No results for input(s): AMMONIA in the last 168 hours. Coagulation Profile: Recent Labs  Lab 05/31/18 1527 06/01/18 0315 06/02/18 0933 06/03/18 0628  INR 3.13 3.38 2.42 1.73   Cardiac Enzymes: No results for input(s): CKTOTAL, CKMB, CKMBINDEX, TROPONINI in the last 168 hours. BNP (last 3 results) No results for input(s): PROBNP in the last 8760 hours. HbA1C: No results for input(s): HGBA1C in the last 72 hours. CBG: No results for input(s): GLUCAP in the last 168 hours. Lipid Profile: No results for input(s): CHOL, HDL, LDLCALC, TRIG, CHOLHDL, LDLDIRECT in the last 72 hours. Thyroid Function Tests: Recent Labs    06/01/18 0315 06/01/18 1457  TSH 0.333*  --   FREET4  --  0.96  T3FREE  --  1.6*   Anemia Panel: No results for input(s): VITAMINB12, FOLATE, FERRITIN, TIBC, IRON, RETICCTPCT in the last 72 hours. Sepsis Labs: No results for input(s): PROCALCITON, LATICACIDVEN in the last 168 hours.  Recent Results (from the past 240 hour(s))  Urine culture     Status: Abnormal   Collection Time: 05/31/18  5:13 PM  Result Value Ref Range Status   Specimen Description URINE, CLEAN CATCH  Final   Special Requests NONE  Final   Culture (A)  Final    <10,000 COLONIES/mL INSIGNIFICANT  GROWTH Performed at [facility], [address] [place], [place] [address]    Report Status 06/02/2018 FINAL  Final         Radiology Studies: No results found.      Scheduled Meds: . atorvastatin  20 mg Oral Daily  . brimonidine  1 drop Right Eye BID  . diltiazem  120 mg Oral Daily  . dorzolamide  1 drop Both Eyes BID  . feeding supplement (ENSURE ENLIVE)  237 mL Oral BID BM  . hydrALAZINE  25 mg Oral BID  . latanoprost  1 drop Both Eyes QHS  . senna  1 tablet Oral BID  . warfarin  3 mg Oral ONCE-1800  . Warfarin - Pharmacist Dosing Inpatient   Does not apply q1800   Continuous Infusions: . lactated  ringers 75 mL/hr at 06/03/18 1304     LOS: 3 days    Time spent: 25 mins.More than 50% of that time was spent in counseling and/or coordination of care.      Shelly Coss, MD [facility] Pager [phone]  If 7PM-7AM, please contact night-coverage [email or web address] Password [email or web address] 06/03/2018, 1:32 PM

## 2018-06-03 NOTE — Clinical Social Work Note (Signed)
CSW spoke with pt's daughter, the facility of choice is [facility]. [facility] will take pt and has started [facility] auth at this time.   Loletha Grayer, MSW [phone]

## 2018-06-03 NOTE — Progress Notes (Signed)
ANTICOAGULATION CONSULT NOTE - Follow Up Consult  Pharmacy Consult for Warfarin Indication: atrial fibrillation  Allergies  Allergen Reactions  . Penicillins Swelling  . Zocor [Simvastatin] Other (See Comments)    Weak and dizzy    Patient Measurements: Height: 5' (152.4 cm) Weight: 102 lb (46.3 kg) IBW/kg (Calculated) : 45.5  Vital Signs: Temp: 97.6 F (36.4 C) (08/31 0839) Temp Source: Oral (08/31 0839) BP: 143/104 (08/31 0839) Pulse Rate: 88 (08/31 0839)  Labs: Recent Labs    05/31/18 1527 06/01/18 0315 06/02/18 0419 06/02/18 0933 06/03/18 0628  HGB 13.7 13.7  --   --   --   HCT 40.2 40.8  --   --   --   PLT 423* 371  --   --   --   LABPROT 31.9* 33.9*  --  26.2* 20.1*  INR 3.13 3.38  --  2.42 1.73  CREATININE 9.67* 7.77* 4.99*  --  4.45*    Assessment: 84 year old female on warfarin prior to admission for Afib INR elevated on admission and held for 3 days, now INR has trended down to 1.73  Goal of Therapy:  INR 2-3 Monitor platelets by anticoagulation protocol: Yes   Plan:  Warfarin 3 mg po x 1 dose tonight Daily INR  Thank you Anette Guarneri, PharmD [phone]  06/03/2018,12:19 PM

## 2018-06-03 NOTE — Progress Notes (Signed)
Patient ID: Kelli Peterson, female   DOB: 1930/03/24, 84 y.o.   MRN: [id number]  [facility] Progress Note   Assessment/ Plan:   1.  Acute kidney injury on chronic kidney disease stage III: Renal function stable overnight with fair urine output-restart low-rate intravenous fluids as oral intake remains suboptimal.  She does not have any acute electrolyte abnormalities or uremic signs or symptoms. 2.  Anion gap metabolic acidosis: Corrected with intravenous fluids. 3.  Possible urinary tract infection: Urine cultures negative to date, off antibiotic therapy. 4.  Hypertension: Elevated blood pressures noted-on diltiazem, evaluate for need to augment antihypertensive therapy with hydralazine. 5.  Diastolic congestive heart failure: Appears to be compensated at this time/euvolemic.  Monitor cautiously on intravenous fluids.  Subjective:   Denies any acute events overnight.  Daughter is concerned that she has not had a bowel movement.   Objective:   BP (!) 143/104 (BP Location: Left Arm)   Pulse 88   Temp 97.6 F (36.4 C) (Oral)   Resp 17   Ht 5' (1.524 m)   Wt 46.3 kg   SpO2 98%   BMI 19.92 kg/m   Intake/Output Summary (Last 24 hours) at 06/03/2018 1126 Last data filed at 06/03/2018 0946 Gross per 24 hour  Intake 780 ml  Output 3400 ml  Net -2620 ml   Weight change:   Physical Exam: Gen: Comfortably sitting up in recliner CVS: Pulse regular rhythm, normal rate, S1 and S2 normal Resp: Clear to auscultation, no rales/rhonchi Abd: Soft, flat, nontender Ext: No lower extremity edema  Imaging: No results found.  Labs: BMET Recent Labs  Lab 05/31/18 1527 05/31/18 2000 06/01/18 0315 06/02/18 0419 06/03/18 0628  NA 137  --  141 139 142  K 4.9  --  4.2 3.0* 3.6  CL 102  --  110 103 105  CO2 17*  --  16* 24 25  GLUCOSE 191*  --  140* 102* 118*  BUN 155*  --  138* 104* 101*  CREATININE 9.67*  --  7.77* 4.99* 4.45*  CALCIUM 8.6*  --  8.0* 6.8* 8.3*  PHOS  --   6.5* 6.5*  --   --    CBC Recent Labs  Lab 05/31/18 1527 06/01/18 0315  WBC 15.0* 10.3  HGB 13.7 13.7  HCT 40.2 40.8  MCV 88.5 88.7  PLT 423* 371   Medications:    . atorvastatin  20 mg Oral Daily  . brimonidine  1 drop Right Eye BID  . diltiazem  120 mg Oral Daily  . dorzolamide  1 drop Both Eyes BID  . feeding supplement (ENSURE ENLIVE)  237 mL Oral BID BM  . latanoprost  1 drop Both Eyes QHS  . senna  1 tablet Oral BID   Elmarie Shiley, MD 06/03/2018, 11:26 AM

## 2018-06-04 LAB — BASIC METABOLIC PANEL
Anion gap: 12 (ref 5–15)
BUN: 82 mg/dL — ABNORMAL HIGH (ref 8–23)
CO2: 23 mmol/L (ref 22–32)
Calcium: 8.2 mg/dL — ABNORMAL LOW (ref 8.9–10.3)
Chloride: 106 mmol/L (ref 98–111)
Creatinine, Ser: 3.55 mg/dL — ABNORMAL HIGH (ref 0.44–1.00)
GFR calc Af Amer: 12 mL/min — ABNORMAL LOW (ref >60)
GFR calc non Af Amer: 11 mL/min — ABNORMAL LOW (ref >60)
Glucose, Bld: 97 mg/dL (ref 70–99)
Potassium: 3.5 mmol/L (ref 3.5–5.1)
Sodium: 141 mmol/L (ref 135–145)

## 2018-06-04 LAB — PROTIME-INR
INR: 1.62
Prothrombin Time: 19.1 seconds — ABNORMAL HIGH (ref 11.4–15.2)

## 2018-06-04 MED ORDER — WARFARIN SODIUM 5 MG PO TABS
5.0000 mg | ORAL_TABLET | Freq: Once | ORAL | Status: AC
  Administered 2018-06-04: 5 mg via ORAL
  Filled 2018-06-04: qty 1

## 2018-06-04 MED ORDER — DILTIAZEM HCL ER COATED BEADS 120 MG PO CP24: 120.0000 mg | ORAL_CAPSULE | Freq: Once | ORAL | Status: DC

## 2018-06-04 MED ORDER — HYDRALAZINE HCL 25 MG PO TABS: 25.0000 mg | ORAL_TABLET | Freq: Two times a day (BID) | ORAL | 0 refills | Status: DC

## 2018-06-04 MED ORDER — DILTIAZEM HCL ER BEADS 180 MG PO CP24: 180.0000 mg | ORAL_CAPSULE | Freq: Every day | ORAL | 0 refills | Status: DC

## 2018-06-04 MED ORDER — DILTIAZEM HCL ER COATED BEADS 120 MG PO CP24
240.0000 mg | ORAL_CAPSULE | Freq: Every day | ORAL | Status: DC
Start: 2018-06-05 — End: 2018-06-04

## 2018-06-04 MED ORDER — DILTIAZEM HCL ER COATED BEADS 180 MG PO CP24
180.0000 mg | ORAL_CAPSULE | Freq: Every day | ORAL | Status: DC
  Administered 2018-06-05 – 2018-06-06 (×2): 180 mg via ORAL
  Filled 2018-06-04 (×2): qty 1

## 2018-06-04 NOTE — Progress Notes (Signed)
Patient ID: Kelli Peterson, female   DOB: 11/24/1929, 84 y.o.   MRN: [id number] [facility] Progress Note   Assessment/ Plan:   1.  Acute kidney injury on chronic kidney disease stage III: Continues to have improving renal function with fair urine output on intravenous fluids as we await improvement of her oral intake.  Stable from renal standpoint to discharge to skilled nursing facility off of intravenous fluids and with repeat labs in 1 week (with review by facility physician). 2.  Anion gap metabolic acidosis: Corrected with intravenous fluids. 3.  Possible urinary tract infection: Urine cultures negative to date, off antibiotic therapy. 4.  Hypertension: Elevated blood pressures noted-on diltiazem, evaluate for need to augment antihypertensive therapy with hydralazine. 5.  Diastolic congestive heart failure: Appears to be euvolemic and compensated state, monitor on intravenous fluids.  Will sign off at this time, please call with questions or concerns  Subjective:   Reports to be feeling better this morning, denies any acute events overnight or current complaints.   Objective:   BP (!) 144/71 (BP Location: Right Arm)   Pulse 72   Temp 98.3 F (36.8 C) (Oral)   Resp 16   Ht 5' (1.524 m)   Wt 46.3 kg   SpO2 97%   BMI 19.92 kg/m   Intake/Output Summary (Last 24 hours) at 06/04/2018 1032 Last data filed at 06/04/2018 0950 Gross per 24 hour  Intake 820 ml  Output 1430 ml  Net -610 ml   Weight change:   Physical Exam: Gen: Comfortably resting in bed, daughter at bedside CVS: Pulse regular rhythm, normal rate, S1 and S2 normal Resp: Clear to auscultation, no rales/rhonchi Abd: Soft, flat, nontender Ext: No lower extremity edema  Imaging: No results found.  Labs: BMET Recent Labs  Lab 05/31/18 1527 05/31/18 2000 06/01/18 0315 06/02/18 0419 06/03/18 0628 06/04/18 0756  NA 137  --  141 139 142 141  K 4.9  --  4.2 3.0* 3.6 3.5  CL 102  --  110 103 105  106  CO2 17*  --  16* 24 25 23   GLUCOSE 191*  --  140* 102* 118* 97  BUN 155*  --  138* 104* 101* 82*  CREATININE 9.67*  --  7.77* 4.99* 4.45* 3.55*  CALCIUM 8.6*  --  8.0* 6.8* 8.3* 8.2*  PHOS  --  6.5* 6.5*  --   --   --    CBC Recent Labs  Lab 05/31/18 1527 06/01/18 0315  WBC 15.0* 10.3  HGB 13.7 13.7  HCT 40.2 40.8  MCV 88.5 88.7  PLT 423* 371   Medications:    . atorvastatin  20 mg Oral Daily  . brimonidine  1 drop Right Eye BID  . diltiazem  120 mg Oral Daily  . dorzolamide  1 drop Both Eyes BID  . feeding supplement (ENSURE ENLIVE)  237 mL Oral BID BM  . hydrALAZINE  25 mg Oral BID  . latanoprost  1 drop Both Eyes QHS  . senna  1 tablet Oral BID  . Warfarin - Pharmacist Dosing Inpatient   Does not apply [id number]   Elmarie Shiley, MD 06/04/2018, 10:32 AM

## 2018-06-04 NOTE — Progress Notes (Addendum)
ANTICOAGULATION CONSULT NOTE - Follow Up Consult  Pharmacy Consult for Warfarin Indication: atrial fibrillation  Allergies  Allergen Reactions  . Penicillins Swelling  . Zocor [Simvastatin] Other (See Comments)    Weak and dizzy    Patient Measurements: Height: 5' (152.4 cm) Weight: 102 lb (46.3 kg) IBW/kg (Calculated) : 45.5  Vital Signs: Temp: 98.3 F (36.8 C) (09/01 0831) Temp Source: Oral (09/01 0831) BP: 144/71 (09/01 0831) Pulse Rate: 72 (09/01 0831)  Labs: Recent Labs    06/02/18 0419 06/02/18 0933 06/03/18 0628 06/04/18 0756  LABPROT  --  26.2* 20.1* 19.1*  INR  --  2.42 1.73 1.62  CREATININE 4.99*  --  4.45* 3.55*    Assessment: 84 year old female on warfarin prior to admission for Afib INR elevated on admission and held for 3 days, now INR has trended down to 1.62  Goal of Therapy:  INR 2-3 Monitor platelets by anticoagulation protocol: Yes   Plan:  Warfarin 5 mg po x 1 dose tonight (if not discharged to SNF) Daily INR  Thank you Anette Guarneri, PharmD [phone]  06/04/2018,11:12 AM

## 2018-06-04 NOTE — Discharge Summary (Addendum)
Physician Discharge Summary  Kelli Peterson FTD:[id number] DOB: 1929-11-20 DOA: 05/31/2018  PCP: Jani Gravel, MD  Admit date: 05/31/2018 Discharge date: 06/06/18 Admitted From: Home Disposition:  SNF  Discharge Condition:Stable CODE STATUS:FULL Diet recommendation: Heart Healthy   Brief/Interim Summary: Patient is a 84 year old female with past medical history of A. fib on Coumadin, hyperlipidemia, chronic diastolic CHF who presents to the emergency department with complaints of generalized fatigue for last 2 weeks.  Patient was also noted to have diarrhea at home since last 2 weeks and was not able to drink or eat properly.  Patient was found to have severe acute kidney injury on presentation. Started with IV fluids.Renal function has improved with iv fluids.  Nephrology was following.  She is stable for discharge to skilled nursing facility as soon as the bed is available.    Following problems were addressed during hospitalization:   Acute kidney injury: Likely secondary to dehydration from diarrhea.  Also presented with severe metabolic acidosis.  Kidney function improved with IV fluids.  Creatinine today in the range of 2.  Follow-up BMP in a week.  Lisinopril and Lasix stopped .  Chronic A. CWC:[id number] vas score 5.  Currently INR is therapeutic. She is on warfarin.  She was on digoxin at home.  Hold digoxin for now because of the suboptimal kidney function. She might need to be off digoxin permanently. We have  increased the dose of Cardizem from 120 mg to 180 mg.  Also started on Toprol.  Her heart rate fluctuates from 80-110.  COPD: Currently stable  Glaucoma: Resume home medications.  Hyperlipidemia: Continue Lipitor  Questionable UTI: No mention of dysuria or fever.  Urine culture did not show significant growth.  Generalized weakness/deconditioning:  PT evaluation done and recommended skilled nursing facility on discharge.  Social worker consulted.  Low TSH: Low TSH,  low T3, normal T4.  Most likely associated  with euthyroid sick syndrome.  History of diastolic heart failure: Currently stable.Lasix and lisinopril discontinued due to acute kidney injury.   Discharge Diagnoses:  Active Problems:   AKI (acute kidney injury) ([facility])   Dehydration   Essential hypertension   Atrial fibrillation, chronic (HCC)   COPD (chronic obstructive pulmonary disease) (HCC)   Glaucoma   Hyperlipidemia   Acute renal failure (ARF) (HCC)   Constipation   Protein-calorie malnutrition, severe    Discharge Instructions  Discharge Instructions    Diet - low sodium heart healthy   Complete by:  As directed    Discharge instructions   Complete by:  As directed    1) Take prescribed medications as instructed. 2) Do a BMP test in a week.   Increase activity slowly   Complete by:  As directed      Allergies as of 06/04/2018      Reactions   Penicillins Swelling   Zocor [simvastatin] Other (See Comments)   Weak and dizzy      Medication List    STOP taking these medications   cephALEXin 500 MG capsule Commonly known as:  KEFLEX   digoxin 0.25 MG tablet Commonly known as:  LANOXIN   furosemide 20 MG tablet Commonly known as:  LASIX   lisinopril 20 MG tablet Commonly known as:  PRINIVIL,ZESTRIL     TAKE these medications   acetaminophen 500 MG tablet Commonly known as:  TYLENOL Take 500 mg by mouth every 6 (six) hours as needed for pain.   alendronate 70 MG tablet Commonly known as:  FOSAMAX Take  70 mg by mouth every Thursday. Take with a full glass of water on an empty stomach.   amLODipine 5 MG tablet Commonly known as:  NORVASC Take 5 mg by mouth daily.   atorvastatin 20 MG tablet Commonly known as:  LIPITOR Take 20 mg by mouth daily.   brimonidine 0.2 % ophthalmic solution Commonly known as:  ALPHAGAN Place 1 drop into the right eye 2 (two) times daily.   diltiazem 180 MG 24 hr capsule Commonly known as:  TIAZAC Take 1 capsule (180  mg total) by mouth daily. What changed:    medication strength  how much to take   dorzolamide 2 % ophthalmic solution Commonly known as:  TRUSOPT Place 1 drop into both eyes 2 (two) times daily.   ferrous sulfate 325 (65 FE) MG tablet Take 325 mg by mouth daily with breakfast.   hydrALAZINE 25 MG tablet Commonly known as:  APRESOLINE Take 1 tablet (25 mg total) by mouth 2 (two) times daily.   latanoprost 0.005 % ophthalmic solution Commonly known as:  XALATAN Place 1 drop into both eyes at bedtime.   multivitamin with minerals Tabs tablet Take 1 tablet by mouth daily.   oxybutynin 5 MG tablet Commonly known as:  DITROPAN Take 5 mg by mouth 2 (two) times daily as needed for bladder spasms.   warfarin 2 MG tablet Commonly known as:  COUMADIN Take 2-3 mg by mouth See admin instructions. Take 1 tablet (2mg ) every Sunday, Monday, Wednesday, and Friday. Then take 1 1/2 tablets (3mg ) every Tuesday Thursday and Saturday.      Contact information for after-discharge care    Destination    [facility] .   Service:  Skilled Nursing Contact information: [place] [address] [phone]             Allergies  Allergen Reactions  . Penicillins Swelling  . Zocor [Simvastatin] Other (See Comments)    Weak and dizzy    Consultations: Nephrology  Procedures/Studies: Ct Renal Stone Study  Result Date: 05/31/2018 CLINICAL DATA:  Generalized weakness for the past 10 days. EXAM: CT ABDOMEN AND PELVIS WITHOUT CONTRAST TECHNIQUE: Multidetector CT imaging of the abdomen and pelvis was performed following the standard protocol without IV contrast. COMPARISON:  Abdominal MRI-02/28/2018; pelvic MRI-03/13/2018; right hip CT-02/15/2016 FINDINGS: The lack of intravenous contrast limits the ability to evaluate solid abdominal organs. Lower chest: Limited visualization of the lower thorax demonstrates minimal dependent subpleural  ground-glass atelectasis. No pleural effusion. Cardiomegaly. Coronary artery calcifications. Exuberant calcifications within the mitral valve annulus. No pericardial effusion. Hepatobiliary: Normal hepatic contour. There is a jack shaped approximately 1.6 x 1.2 cm radiopaque stone within the neck of an otherwise normal-appearing gallbladder (coronal image 35, series 6), similar to abdominal MRI performed 02/28/2018. No gallbladder wall thickening or pericholecystic stranding. No ascites. Pancreas: Note is made of an approximately 1.0 cm hypoattenuating lesion within the mid body of the pancreas which is incompletely characterized on this noncontrast examination though appears morphologically similar to the 02/2018 examination and is again favored to represent a benign side branch IPMN. No peripancreatic stranding. Spleen: Slightly diminutive otherwise normal appearance. Adrenals/Urinary Tract: Previously characterized approximately 5.2 cm partially exophytic left-sided renal cyst is unchanged. Calcifications about the bilateral renal hilar favored to be vascular in etiology. No renal stones. No urinary obstruction or perinephric stranding. Normal noncontrast appearance the bilateral adrenal glands. Normal noncontrast appearance of the urinary bladder. Stomach/Bowel: Large colonic stool burden without evidence of  enteric obstruction. Colonic diverticulosis without evidence of diverticulitis. Normal noncontrast appearance of the terminal ileum. The appendix is not visualized however there is no definitive pericecal inflammatory change on this noncontrast examination. No pneumoperitoneum, pneumatosis or portal venous gas. Vascular/Lymphatic: There is a large amount of atherosclerotic plaque within a tortuous and mildly ectatic abdominal aorta which measures approximately 2.5 cm in greatest oblique short axis coronal diameter (coronal image 34, series 6). No bulky retroperitoneal, mesenteric, pelvic or inguinal  lymphadenopathy. Reproductive: Post hysterectomy.  No discrete adnexal lesion. Other: Regional soft tissues appear normal. Musculoskeletal: Old/healed fractures involving the right superior (image 60, series 2) and inferior pubic rami (image 69). Mixed lytic and sclerotic lesion involving the posterior superior aspect the right iliac crest (images 35 and 39, series 3) appears similar to recent pelvic MRI performed 03/13/2018 at which time are favored to represent either the sequela of prior bone infarct early Paget's disease. IMPRESSION: 1. No acute findings within the abdomen or pelvis. 2. Large colonic stool burden without evidence of enteric obstruction. 3. Cholelithiasis without evidence of superimposed acute cholecystitis on this noncontrast examination and similar to abdominal MRI performed 02/28/2018. If there is clinical concern for acute cholecystitis, further evaluation with right upper quadrant abdominal pain could be performed as indicated. 4. Approximately 1 cm hypoattenuating pancreatic lesion, similar to abdominal MRI performed 02/2018 at which time lesion was favored to be benign however a 2 year follow-up MRI was recommended to ensure stability. 5.  Aortic Atherosclerosis (ICD10-I70.0). 6. Mild ectasia of the abdominal aorta measuring 2.5 cm in diameter. Recommend follow-up aortic ultrasound in 5 years. This recommendation follows ACR consensus guidelines: White Paper of the ACR Incidental Findings Committee II on Vascular Findings. J Am Coll Radiol 2013; 10:789-794. Electronically Signed   By: Sandi Mariscal M.D.   On: 05/31/2018 17:24       Subjective: Patient seen and examined the bedside this morning.  Remains comfortable.  Hemodynamically  stable.  Stable for discharge to skilled nursing facility today.  Discharge Exam: Vitals:   06/04/18 0549 06/04/18 0831  BP: (!) 164/96 (!) 144/71  Pulse: 80 72  Resp: 12 16  Temp: (!) 97.4 F (36.3 C) 98.3 F (36.8 C)  SpO2: 99% 97%    Vitals:   06/04/18 0228 06/04/18 0237 06/04/18 0549 06/04/18 0831  BP: (!) 154/93 (!) 154/93 (!) 164/96 (!) 144/71  Pulse: (!) 56 (!) 56 80 72  Resp: 16 16 12 16   Temp: 97.8 F (36.6 C) 97.8 F (36.6 C) (!) 97.4 F (36.3 C) 98.3 F (36.8 C)  TempSrc: Oral Oral Oral Oral  SpO2: 98% 98% 99% 97%  Weight:      Height:        General: Pt is alert, awake, not in acute distress, elderly female Cardiovascular: RRR, S1/S2 +, no rubs, no gallops Respiratory: CTA bilaterally, no wheezing, no rhonchi Abdominal: Soft, NT, ND, bowel sounds + Extremities: no edema, no cyanosis    The results of significant diagnostics from this hospitalization (including imaging, microbiology, ancillary and laboratory) are listed below for reference.     Microbiology: Recent Results (from the past 240 hour(s))  Urine culture     Status: Abnormal   Collection Time: 05/31/18  5:13 PM  Result Value Ref Range Status   Specimen Description URINE, CLEAN CATCH  Final   Special Requests NONE  Final   Culture (A)  Final    <10,000 COLONIES/mL INSIGNIFICANT GROWTH Performed at [facility], [address] [place], [place] [address]    Report Status 06/02/2018 FINAL  Final     Labs: BNP (last 3 results) No results for input(s): BNP in the last 8760 hours. Basic Metabolic Panel: Recent Labs  Lab 05/31/18 1527 05/31/18 2000 06/01/18 0315 06/02/18 0419 06/03/18 0628 06/04/18 0756  NA 137  --  141 139 142 141  K 4.9  --  4.2 3.0* 3.6 3.5  CL 102  --  110 103 105 106  CO2 17*  --  16* 24 25 23   GLUCOSE 191*  --  140* 102* 118* 97  BUN 155*  --  138* 104* 101* 82*  CREATININE 9.67*  --  7.77* 4.99* 4.45* 3.55*  CALCIUM 8.6*  --  8.0* 6.8* 8.3* 8.2*  MG  --  2.7* 2.7*  --   --   --   PHOS  --  6.5* 6.5*  --   --   --    Liver Function Tests: Recent Labs  Lab 05/31/18 2000 06/01/18 0315  AST 10* 12*  ALT 16 15  ALKPHOS 73 73  BILITOT 0.9 0.6  PROT 7.7 7.2  ALBUMIN 3.0* 3.1*   No  results for input(s): LIPASE, AMYLASE in the last 168 hours. No results for input(s): AMMONIA in the last 168 hours. CBC: Recent Labs  Lab 05/31/18 1527 06/01/18 0315  WBC 15.0* 10.3  HGB 13.7 13.7  HCT 40.2 40.8  MCV 88.5 88.7  PLT 423* 371   Cardiac Enzymes: No results for input(s): CKTOTAL, CKMB, CKMBINDEX, TROPONINI in the last 168 hours. BNP: Invalid input(s): POCBNP CBG: No results for input(s): GLUCAP in the last 168 hours. D-Dimer No results for input(s): DDIMER in the last 72 hours. Hgb A1c No results for input(s): HGBA1C in the last 72 hours. Lipid Profile No results for input(s): CHOL, HDL, LDLCALC, TRIG, CHOLHDL, LDLDIRECT in the last 72 hours. Thyroid function studies Recent Labs    06/01/18 1457  T3FREE 1.6*   Anemia work up No results for input(s): VITAMINB12, FOLATE, FERRITIN, TIBC, IRON, RETICCTPCT in the last 72 hours. Urinalysis    Component Value Date/Time   COLORURINE YELLOW 05/31/2018 1913   APPEARANCEUR CLEAR 05/31/2018 1913   LABSPEC 1.010 05/31/2018 1913   PHURINE 5.0 05/31/2018 1913   GLUCOSEU NEGATIVE 05/31/2018 1913   HGBUR NEGATIVE 05/31/2018 1913   BILIRUBINUR NEGATIVE 05/31/2018 1913   KETONESUR NEGATIVE 05/31/2018 1913   PROTEINUR NEGATIVE 05/31/2018 1913   NITRITE NEGATIVE 05/31/2018 1913   LEUKOCYTESUR NEGATIVE 05/31/2018 1913   Sepsis Labs Invalid input(s): PROCALCITONIN,  WBC,  LACTICIDVEN Microbiology Recent Results (from the past 240 hour(s))  Urine culture     Status: Abnormal   Collection Time: 05/31/18  5:13 PM  Result Value Ref Range Status   Specimen Description URINE, CLEAN CATCH  Final   Special Requests NONE  Final   Culture (A)  Final    <10,000 COLONIES/mL INSIGNIFICANT GROWTH Performed at [facility], [address] [place],  [address]    Report Status 06/02/2018 FINAL  Final    Please note: You were cared for by a hospitalist during your hospital stay. Once you are discharged, your  primary care physician will handle any further medical issues. Please note that NO REFILLS for any discharge medications will be authorized once you are discharged, as it is imperative that you return to your primary care physician (or establish a relationship with a primary care physician if you do not have one) for your  post hospital discharge needs so that they can reassess your need for medications and monitor your lab values.    Time coordinating discharge: 40 minutes  SIGNED:   Shelly Coss, MD  [facility] 06/04/2018, 11:06 AM Pager [phone]  If 7PM-7AM, please contact night-coverage [email or web address] Password [email or web address]

## 2018-06-04 NOTE — Progress Notes (Signed)
Patient had a episode of 2.08 pause. Text paged NP Bodenheimer. EKG and vital signs done.

## 2018-06-05 LAB — BASIC METABOLIC PANEL
Anion gap: 11 (ref 5–15)
BUN: 67 mg/dL — ABNORMAL HIGH (ref 8–23)
CO2: 27 mmol/L (ref 22–32)
Calcium: 8.4 mg/dL — ABNORMAL LOW (ref 8.9–10.3)
Chloride: 102 mmol/L (ref 98–111)
Creatinine, Ser: 2.99 mg/dL — ABNORMAL HIGH (ref 0.44–1.00)
GFR calc Af Amer: 15 mL/min — ABNORMAL LOW (ref >60)
GFR calc non Af Amer: 13 mL/min — ABNORMAL LOW (ref >60)
Glucose, Bld: 169 mg/dL — ABNORMAL HIGH (ref 70–99)
Potassium: 3.2 mmol/L — ABNORMAL LOW (ref 3.5–5.1)
Sodium: 140 mmol/L (ref 135–145)

## 2018-06-05 LAB — PROTIME-INR
INR: 1.89
Prothrombin Time: 21.5 seconds — ABNORMAL HIGH (ref 11.4–15.2)

## 2018-06-05 MED ORDER — WARFARIN SODIUM 3 MG PO TABS
3.0000 mg | ORAL_TABLET | Freq: Once | ORAL | Status: AC
  Administered 2018-06-05: 3 mg via ORAL
  Filled 2018-06-05: qty 1

## 2018-06-05 MED ORDER — METOPROLOL SUCCINATE ER 25 MG PO TB24
25.0000 mg | ORAL_TABLET | Freq: Every day | ORAL | Status: DC
  Administered 2018-06-05 – 2018-06-06 (×2): 25 mg via ORAL
  Filled 2018-06-05 (×2): qty 1

## 2018-06-05 NOTE — Progress Notes (Signed)
ANTICOAGULATION CONSULT NOTE - Follow Up Consult  Pharmacy Consult for Warfarin Indication: atrial fibrillation  Allergies  Allergen Reactions  . Penicillins Swelling  . Zocor [Simvastatin] Other (See Comments)    Weak and dizzy    Patient Measurements: Height: 5' (152.4 cm) Weight: 102 lb (46.3 kg) IBW/kg (Calculated) : 45.5  Vital Signs: Temp: 97.6 F (36.4 C) (09/02 0929) Temp Source: Oral (09/02 0929) BP: 133/75 (09/02 0929) Pulse Rate: 74 (09/02 0929)  Labs: Recent Labs    06/03/18 0628 06/04/18 0756 06/05/18 0958 06/05/18 1203  LABPROT 20.1* 19.1*  --  21.5*  INR 1.73 1.62  --  1.89  CREATININE 4.45* 3.55* 2.99*  --     Assessment: 84 year old female on warfarin prior to admission for Afib INR elevated on admission, peaked at 3.38 on 8/29. Warfarin and held for 3 days inpatient and held PTA per her PCP.  Warfarin resumed 8/31. INR bumped down yesterday but today INR has increased to 1.89. Still below therapeutic goal.  No bleeding noted.   PTA regimen: 2 mg MWFSun, 3 mg TTS.  Last dose pta past week.  Goal of Therapy:  INR 2-3 Monitor platelets by anticoagulation protocol: Yes   Plan:  Warfarin 3 mg po x 1 dose tonight (if not discharged to SNF) Daily INR  Thank you Nicole Cella, [facility] Clinical Pharmacist Pager: [phone] 06/05/2018,12:33 PM

## 2018-06-05 NOTE — Progress Notes (Signed)
PROGRESS NOTE    Kelli Peterson  QPY:[id number] DOB: August 31, 1930 DOA: 05/31/2018 PCP: Jani Gravel, MD   Brief Narrative: Patient is a 84 year old female with past medical history of A. fib on Coumadin, hyperlipidemia, chronic diastolic CHF who presents to the emergency department with complaints of generalized fatigue for last 2 weeks. Patient was also noted to have diarrhea at home since last 2 weeks and was not able to drink or eat properly. Patient was found to have severe acute kidney injury on presentation. Renal function has improvedwith iv fluids.Nephrology was following.She is stable for discharge to skilled nursing facility as soon asthe bed is available.  Assessment & Plan:   Active Problems:   AKI (acute kidney injury) ([facility])   Dehydration   Essential hypertension   Atrial fibrillation, chronic (HCC)   COPD (chronic obstructive pulmonary disease) (HCC)   Glaucoma   Hyperlipidemia   Acute renal failure (ARF) (HCC)   Constipation   Protein-calorie malnutrition, severe   Acute kidney injury:Likely secondary to dehydration from diarrhea at home. Also presented with severe metabolic acidosis. Kidney function improved with IV fluids. Creatinine today in the range of 2.  Follow-up BMP in a week  Chronic A. TIW:[id number] vas score 5.  Continue Cardizem. Also on digoxin at home. Hold digoxin for now until kidney function recovers. She might need to be off digoxin permanently. We have increased the dose of Cardizem from 120 mg to 180 mg.  Also started on Toprol for rate control.  Heart rate is variable from 90-120. On warfarin for anticoagulation.  COPD:Currently stable  Glaucoma: Resume home medications.  Hyperlipidemia: Continue Lipitor  Questionable UTI:No mention of dysuria or fever. Urine culture did not show significant growth.  Generalized weakness/deconditioning: PT evaluation done and recommended skilled nursing facility on discharge. Social  worker consulted.  Low TSH: Low TSH, low T3, normal T4. Most likely associated with euthyroid sick syndrome.  History of diastolic heart failure:Currently stable.Lasix and lisinopril discontinued due to acute kidney injury.    DVT prophylaxis: Warfarin Code Status: Full Family Communication: Daughter at the bedside Disposition Plan: Skilled nursing facility as soon as the bed is available   Consultants: Nephrology  Procedures:None  Antimicrobials:None  Subjective: Patient seen and examined the bedside this morning.  Remains comfortable.  No active issues/events.  Objective: Vitals:   06/04/18 1807 06/04/18 2110 06/05/18 0618 06/05/18 0929  BP: (!) 154/78 (!) 145/99 (!) 167/89 133/75  Pulse: 64 82 91 74  Resp: 18 16 16 16   Temp: (!) 97.5 F (36.4 C) 98 F (36.7 C) 98.2 F (36.8 C) 97.6 F (36.4 C)  TempSrc: Oral Oral Oral Oral  SpO2: 97% 96% 94% 97%  Weight:      Height:        Intake/Output Summary (Last 24 hours) at 06/05/2018 1223 Last data filed at 06/05/2018 1125 Gross per 24 hour  Intake 370 ml  Output 1900 ml  Net -1530 ml   Filed Weights   05/31/18 1522  Weight: 46.3 kg    Examination:  General exam: Appears calm and comfortable ,Not in distress, thin built elderly female, generalized weakness HEENT:PERRL,Oral mucosa moist, Ear/Nose normal on gross exam Respiratory system: Bilateral equal air entry, normal vesicular breath sounds, no wheezes or crackles  Cardiovascular system: S1 & S2 heard,Afib . No JVD, murmurs, rubs, gallops or clicks. Gastrointestinal system: Abdomen is nondistended, soft and nontender. No organomegaly or masses felt. Normal bowel sounds heard. Central nervous system: Alert . Extremities: No edema, no clubbing ,  no cyanosis, distal peripheral pulses palpable. Skin: No rashes, lesions or ulcers,no icterus ,no pallor    Data Reviewed: I have personally reviewed following labs and imaging studies  CBC: Recent Labs  Lab  05/31/18 1527 06/01/18 0315  WBC 15.0* 10.3  HGB 13.7 13.7  HCT 40.2 40.8  MCV 88.5 88.7  PLT 423* 562   Basic Metabolic Panel: Recent Labs  Lab 05/31/18 2000 06/01/18 0315 06/02/18 0419 06/03/18 0628 06/04/18 0756 06/05/18 0958  NA  --  141 139 142 141 140  K  --  4.2 3.0* 3.6 3.5 3.2*  CL  --  110 103 105 106 102  CO2  --  16* 24 25 23 27   GLUCOSE  --  140* 102* 118* 97 169*  BUN  --  138* 104* 101* 82* 67*  CREATININE  --  7.77* 4.99* 4.45* 3.55* 2.99*  CALCIUM  --  8.0* 6.8* 8.3* 8.2* 8.4*  MG 2.7* 2.7*  --   --   --   --   PHOS 6.5* 6.5*  --   --   --   --    GFR: Estimated Creatinine Clearance: 9.7 mL/min (A) (by C-G formula based on SCr of 2.99 mg/dL (H)). Liver Function Tests: Recent Labs  Lab 05/31/18 2000 06/01/18 0315  AST 10* 12*  ALT 16 15  ALKPHOS 73 73  BILITOT 0.9 0.6  PROT 7.7 7.2  ALBUMIN 3.0* 3.1*   No results for input(s): LIPASE, AMYLASE in the last 168 hours. No results for input(s): AMMONIA in the last 168 hours. Coagulation Profile: Recent Labs  Lab 05/31/18 1527 06/01/18 0315 06/02/18 0933 06/03/18 0628 06/04/18 0756  INR 3.13 3.38 2.42 1.73 1.62   Cardiac Enzymes: No results for input(s): CKTOTAL, CKMB, CKMBINDEX, TROPONINI in the last 168 hours. BNP (last 3 results) No results for input(s): PROBNP in the last 8760 hours. HbA1C: No results for input(s): HGBA1C in the last 72 hours. CBG: No results for input(s): GLUCAP in the last 168 hours. Lipid Profile: No results for input(s): CHOL, HDL, LDLCALC, TRIG, CHOLHDL, LDLDIRECT in the last 72 hours. Thyroid Function Tests: No results for input(s): TSH, T4TOTAL, FREET4, T3FREE, THYROIDAB in the last 72 hours. Anemia Panel: No results for input(s): VITAMINB12, FOLATE, FERRITIN, TIBC, IRON, RETICCTPCT in the last 72 hours. Sepsis Labs: No results for input(s): PROCALCITON, LATICACIDVEN in the last 168 hours.  Recent Results (from the past 240 hour(s))  Urine culture      Status: Abnormal   Collection Time: 05/31/18  5:13 PM  Result Value Ref Range Status   Specimen Description URINE, CLEAN CATCH  Final   Special Requests NONE  Final   Culture (A)  Final    <10,000 COLONIES/mL INSIGNIFICANT GROWTH Performed at [facility], [address], [place], [place] [address]    Report Status 06/02/2018 FINAL  Final         Radiology Studies: No results found.      Scheduled Meds: . atorvastatin  20 mg Oral Daily  . brimonidine  1 drop Right Eye BID  . diltiazem  180 mg Oral Daily  . dorzolamide  1 drop Both Eyes BID  . feeding supplement (ENSURE ENLIVE)  237 mL Oral BID BM  . hydrALAZINE  25 mg Oral BID  . latanoprost  1 drop Both Eyes QHS  . metoprolol succinate  25 mg Oral Daily  . senna  1 tablet Oral BID  . Warfarin - Pharmacist Dosing Inpatient  Does not apply q1800   Continuous Infusions:   LOS: 5 days    Time spent: 25 mins.More than 50% of that time was spent in counseling and/or coordination of care.      Shelly Coss, MD [facility] Pager [phone]  If 7PM-7AM, please contact night-coverage [email or web address] Password [email or web address] 06/05/2018, 12:23 PM

## 2018-06-05 NOTE — Care Management Important Message (Signed)
Important Message  Patient Details  Name: Kelli Peterson MRN: [id number] Date of Birth: 03-18-1930   Medicare Important Message Given:  Yes    Karena Kinker [address] 06/05/2018, 8:31 AM

## 2018-06-06 DIAGNOSIS — R498 Other voice and resonance disorders: Secondary | ICD-10-CM | POA: Diagnosis not present

## 2018-06-06 DIAGNOSIS — J449 Chronic obstructive pulmonary disease, unspecified: Secondary | ICD-10-CM | POA: Diagnosis not present

## 2018-06-06 DIAGNOSIS — K5900 Constipation, unspecified: Secondary | ICD-10-CM | POA: Diagnosis not present

## 2018-06-06 DIAGNOSIS — I5032 Chronic diastolic (congestive) heart failure: Secondary | ICD-10-CM | POA: Diagnosis not present

## 2018-06-06 DIAGNOSIS — M6281 Muscle weakness (generalized): Secondary | ICD-10-CM | POA: Diagnosis not present

## 2018-06-06 DIAGNOSIS — Z743 Need for continuous supervision: Secondary | ICD-10-CM | POA: Diagnosis not present

## 2018-06-06 DIAGNOSIS — R279 Unspecified lack of coordination: Secondary | ICD-10-CM | POA: Diagnosis not present

## 2018-06-06 DIAGNOSIS — I480 Paroxysmal atrial fibrillation: Secondary | ICD-10-CM | POA: Diagnosis not present

## 2018-06-06 DIAGNOSIS — R2689 Other abnormalities of gait and mobility: Secondary | ICD-10-CM | POA: Diagnosis not present

## 2018-06-06 DIAGNOSIS — I10 Essential (primary) hypertension: Secondary | ICD-10-CM | POA: Diagnosis not present

## 2018-06-06 DIAGNOSIS — R488 Other symbolic dysfunctions: Secondary | ICD-10-CM | POA: Diagnosis not present

## 2018-06-06 DIAGNOSIS — N179 Acute kidney failure, unspecified: Secondary | ICD-10-CM | POA: Diagnosis not present

## 2018-06-06 DIAGNOSIS — I482 Chronic atrial fibrillation: Secondary | ICD-10-CM | POA: Diagnosis not present

## 2018-06-06 DIAGNOSIS — R197 Diarrhea, unspecified: Secondary | ICD-10-CM | POA: Diagnosis not present

## 2018-06-06 DIAGNOSIS — R531 Weakness: Secondary | ICD-10-CM | POA: Diagnosis not present

## 2018-06-06 DIAGNOSIS — E876 Hypokalemia: Secondary | ICD-10-CM | POA: Diagnosis not present

## 2018-06-06 LAB — BASIC METABOLIC PANEL
Anion gap: 11 (ref 5–15)
BUN: 66 mg/dL — ABNORMAL HIGH (ref 8–23)
CO2: 26 mmol/L (ref 22–32)
Calcium: 8.5 mg/dL — ABNORMAL LOW (ref 8.9–10.3)
Chloride: 103 mmol/L (ref 98–111)
Creatinine, Ser: 2.64 mg/dL — ABNORMAL HIGH (ref 0.44–1.00)
GFR calc Af Amer: 18 mL/min — ABNORMAL LOW (ref >60)
GFR calc non Af Amer: 15 mL/min — ABNORMAL LOW (ref >60)
Glucose, Bld: 105 mg/dL — ABNORMAL HIGH (ref 70–99)
Potassium: 3 mmol/L — ABNORMAL LOW (ref 3.5–5.1)
Sodium: 140 mmol/L (ref 135–145)

## 2018-06-06 LAB — PROTIME-INR
INR: 2.07
Prothrombin Time: 23.1 seconds — ABNORMAL HIGH (ref 11.4–15.2)

## 2018-06-06 MED ORDER — POTASSIUM CHLORIDE CRYS ER 20 MEQ PO TBCR: 40.0000 meq | EXTENDED_RELEASE_TABLET | Freq: Two times a day (BID) | ORAL | Status: DC

## 2018-06-06 MED ORDER — POTASSIUM CHLORIDE CRYS ER 20 MEQ PO TBCR: 20.0000 meq | EXTENDED_RELEASE_TABLET | Freq: Every day | ORAL | 0 refills | Status: DC

## 2018-06-06 MED ORDER — METOPROLOL SUCCINATE ER 25 MG PO TB24: 25.0000 mg | ORAL_TABLET | Freq: Every day | ORAL | 0 refills | Status: DC

## 2018-06-06 MED ORDER — WARFARIN SODIUM 3 MG PO TABS
3.0000 mg | ORAL_TABLET | Freq: Once | ORAL | Status: AC
  Administered 2018-06-06: 3 mg via ORAL
  Filled 2018-06-06: qty 1

## 2018-06-06 MED ORDER — POTASSIUM CHLORIDE CRYS ER 20 MEQ PO TBCR
40.0000 meq | EXTENDED_RELEASE_TABLET | ORAL | Status: AC
  Administered 2018-06-06 (×2): 40 meq via ORAL
  Filled 2018-06-06 (×2): qty 2

## 2018-06-06 NOTE — Progress Notes (Signed)
Physical Therapy Treatment Patient Details Name: Kelli Peterson MRN: [id number] DOB: May 10, 1930 Today's Date: 06/06/2018    History of Present Illness Pt is a 84 y.o. F iwth significant PMH of a.fib on Coumadin, hyperlipidemia, chronic diastolic CHF who presents to ED with complaints of genealized fatigue for last 2 weeks. Also noted to have diarrhea at home since last 2 seeks and not able to drink or eat properly. Found to have severe acute kidney injury on presentation.    PT Comments    Continuing work on functional mobility and activity tolerance;  Session focus on progressive ambulation,  With good participation, able to incr distance; Daughter had questions about dc planning -- will speak with SW at some point; Recommend SNF for post-acute rehab to maximize independence and safety with mobility  Follow Up Recommendations  SNF;Supervision/Assistance - 24 hour     Equipment Recommendations  None recommended by PT    Recommendations for Other Services       Precautions / Restrictions Precautions Precautions: Fall    Mobility  Bed Mobility Overal bed mobility: Needs Assistance Bed Mobility: Supine to Sit     Supine to sit: Min guard;Mod assist     General bed mobility comments: Increased time; once sitting up, noted tendency to posterior lean, requiring light mod assist and support to remain sitting; mod assist to reciprocally scoot to EOB; persistent pslterior lean  Transfers Overall transfer level: Needs assistance Equipment used: Rolling walker (2 wheeled) Transfers: Sit to/from Stand Sit to Stand: Min guard         General transfer comment: tactile cues to initiate with anterior weight shift  Ambulation/Gait Ambulation/Gait assistance: Min guard Gait Distance (Feet): 75 Feet Assistive device: Rolling walker (2 wheeled) Gait Pattern/deviations: Step-through pattern;Trunk flexed;Decreased stride length Gait velocity: decr   General Gait Details: Patient with  slow speed but overall no overt LOB. Verbal cueing for walker proximity, trunk extension/posture, and for deep breathing   Stairs             Wheelchair Mobility    Modified Rankin (Stroke Patients Only)       Balance     Sitting balance-Leahy Scale: Poor       Standing balance-Leahy Scale: Poor Standing balance comment: reliant on external support                            Cognition Arousal/Alertness: Awake/alert Behavior During Therapy: WFL for tasks assessed/performed Overall Cognitive Status: History of cognitive impairments - at baseline                                 General Comments: Spoke very little during session      Exercises      General Comments        Pertinent Vitals/Pain Pain Assessment: No/denies pain Faces Pain Scale: No hurt    Home Living                      Prior Function            PT Goals (current goals can now be found in the care plan section) Acute Rehab PT Goals Patient Stated Goal: Pt and daughter would like more info re: dc plans PT Goal Formulation: Patient unable to participate in goal setting Time For Goal Achievement: 06/15/18 Potential to Achieve Goals: Fair Progress towards PT  goals: Progressing toward goals    Frequency    Min 3X/week      PT Plan Current plan remains appropriate    Co-evaluation              AM-PAC PT "6 Clicks" Daily Activity  Outcome Measure  Difficulty turning over in bed (including adjusting bedclothes, sheets and blankets)?: None Difficulty moving from lying on back to sitting on the side of the bed? : A Lot Difficulty sitting down on and standing up from a chair with arms (e.g., wheelchair, bedside commode, etc,.)?: A Little Help needed moving to and from a bed to chair (including a wheelchair)?: A Little Help needed walking in hospital room?: A Little Help needed climbing 3-5 steps with a railing? : A Lot 6 Click Score: 17     End of Session Equipment Utilized During Treatment: Gait belt Activity Tolerance: Patient tolerated treatment well Patient left: in chair;with call bell/phone within reach;with chair alarm set Nurse Communication: Mobility status PT Visit Diagnosis: Unsteadiness on feet (R26.81);Other abnormalities of gait and mobility (R26.89);Difficulty in walking, not elsewhere classified (R26.2)     Time: 8144-8185 PT Time Calculation (min) (ACUTE ONLY): 20 min  Charges:  $Gait Training: 8-22 mins                     Roney [place], [name]  Acute Rehabilitation Services Pager [phone] Office [phone]    [name] 06/06/2018, 10:23 AM

## 2018-06-06 NOTE — Progress Notes (Signed)
ANTICOAGULATION CONSULT NOTE - Follow Up Consult  Pharmacy Consult for Warfarin Indication: atrial fibrillation  Allergies  Allergen Reactions  . Penicillins Swelling  . Zocor [Simvastatin] Other (See Comments)    Weak and dizzy    Patient Measurements: Height: 5' (152.4 cm) Weight: 102 lb (46.3 kg) IBW/kg (Calculated) : 45.5  Vital Signs: Temp: 97.4 F (36.3 C) (09/03 0749) Temp Source: Oral (09/03 0749) BP: 151/84 (09/03 0749) Pulse Rate: 76 (09/03 0749)  Labs: Recent Labs    06/04/18 0756 06/05/18 0958 06/05/18 1203 06/06/18 0309  LABPROT 19.1*  --  21.5* 23.1*  INR 1.62  --  1.89 2.07  CREATININE 3.55* 2.99*  --  2.64*    Assessment: 84 year old female on warfarin prior to admission for Afib INR elevated on admission, peaked at 3.38 on 8/29. Warfarin and held for 3 days inpatient and held PTA per her PCP.  Warfarin resumed 8/31.   PTA regimen: 2 mg MWFSun, 3 mg TTS.   INR today is therapeutic (INR 2.07 << 1.89, goal of 2-3). No CBC since 8/31 - stable at that time.   Goal of Therapy:  INR 2-3 Monitor platelets by anticoagulation protocol: Yes   Plan:  - Warfarin 3 mg x 1 dose at 1800 today - Will continue to monitor for any signs/symptoms of bleeding and will follow up with PT/INR in the a.m.   Thank you for allowing pharmacy to be a part of this patient's care.  Alycia Rossetti, PharmD, BCPS Clinical Pharmacist Pager: [phone] Clinical phone for 06/06/2018 from 7a-3:30p: [phone] If after 3:30p, please call main pharmacy at: [phone] Please check AMION for all [facility] numbers 06/06/2018 10:35 AM

## 2018-06-06 NOTE — Progress Notes (Signed)
PROGRESS NOTE    Kelli Peterson  JSE:[id number] DOB: Feb 02, 1930 DOA: 05/31/2018 PCP: Jani Gravel, MD   Brief Narrative: Patient is a 84 year old female with past medical history of A. fib on Coumadin, hyperlipidemia, chronic diastolic CHF who presents to the emergency department with complaints of generalized fatigue for last 2 weeks.  Patient was also noted to have diarrhea at home since last 2 weeks and was not able to drink or eat properly.  Patient was found to have severe acute kidney injury on presentation. Renal function has improved with iv fluids.  Nephrology is following.  She is stable for discharge to skilled nursing facility as soon as the bed is available.  We will continue with gentle IV fluids until she is discharged.  Assessment & Plan:   Active Problems:   AKI (acute kidney injury) ([place])   Dehydration   Essential hypertension   Atrial fibrillation, chronic (HCC)   COPD (chronic obstructive pulmonary disease) (HCC)   Glaucoma   Hyperlipidemia   Acute renal failure (ARF) (HCC)   Constipation   Protein-calorie malnutrition, severe  Acute kidney injury: Likely secondary to dehydration from diarrhea.  Also presented with severe metabolic acidosis.  Kidney function improved with IV fluids.  IV fluids continued on slow rate as per nephrology.  We will continue to monitor BMP.  Chronic A. WVP:[id number] vas score 5.  Currently rate is controlled currently INR is therapeutic.  Continue Cardizem.  Also on digoxin at home.  Hold digoxin for now until kidney function recovers.  COPD: Currently stable  Glaucoma: Resume home medications.  Hyperlipidemia: Continue Lipitor  Questionable UTI: No mention of dysuria or fever.  Urine culture did not show significant growth.  Generalized weakness/deconditioning:  PT evaluation done and recommended skilled nursing facility on discharge.  Social worker consulted.  Low TSH: Low TSH, low T3, normal T4.  Most likely associated  with  euthyroid sick syndrome.  History of diastolic heart failure: Currently stable.  DVT prophylaxis: Warfarin Code Status: Full Family Communication: Discussed with daughter at the bedside Disposition Plan: SNF.Waiting for bed   Consultants: Nephrology  Procedures: None  Antimicrobials: None  Subjective: Patient seen and examined the bedside this morning.  Hemodynamically stable.  No new complaints.    Objective: Vitals:   06/05/18 1727 06/05/18 2053 06/06/18 0542 06/06/18 0749  BP: (!) 143/60 (!) 146/64 (!) 136/99 (!) 151/84  Pulse: (!) 59 70 70 76  Resp: 16 (!) 21 16 19   Temp: 97.9 F (36.6 C) 98.1 F (36.7 C) 98.4 F (36.9 C) (!) 97.4 F (36.3 C)  TempSrc: Oral Oral  Oral  SpO2: 97% 96% 98% 98%  Weight:      Height:        Intake/Output Summary (Last 24 hours) at 06/06/2018 1221 Last data filed at 06/06/2018 0900 Gross per 24 hour  Intake 600 ml  Output 525 ml  Net 75 ml   Filed Weights   05/31/18 1522  Weight: 46.3 kg    Examination:  General exam: Appears calm and comfortable ,Not in distress, thin built elderly female, generalized weakness HEENT:PERRL,Oral mucosa moist, Ear/Nose normal on gross exam Respiratory system: Bilateral equal air entry, normal vesicular breath sounds, no wheezes or crackles  Cardiovascular system: S1 & S2 heard, RRR. No JVD, murmurs, rubs, gallops or clicks. Gastrointestinal system: Abdomen is nondistended, soft and nontender. No organomegaly or masses felt. Normal bowel sounds heard. Central nervous system: Alert . Extremities: No edema, no clubbing ,no cyanosis, distal peripheral pulses  palpable. Skin: No rashes, lesions or ulcers,no icterus ,no pallor     Data Reviewed: I have personally reviewed following labs and imaging studies  CBC: Recent Labs  Lab 05/31/18 1527 06/01/18 0315  WBC 15.0* 10.3  HGB 13.7 13.7  HCT 40.2 40.8  MCV 88.5 88.7  PLT 423* 169   Basic Metabolic Panel: Recent Labs  Lab 05/31/18 2000  06/01/18 0315 06/02/18 0419 06/03/18 0628 06/04/18 0756 06/05/18 0958 06/06/18 0309  NA  --  141 139 142 141 140 140  K  --  4.2 3.0* 3.6 3.5 3.2* 3.0*  CL  --  110 103 105 106 102 103  CO2  --  16* 24 25 23 27 26   GLUCOSE  --  140* 102* 118* 97 169* 105*  BUN  --  138* 104* 101* 82* 67* 66*  CREATININE  --  7.77* 4.99* 4.45* 3.55* 2.99* 2.64*  CALCIUM  --  8.0* 6.8* 8.3* 8.2* 8.4* 8.5*  MG 2.7* 2.7*  --   --   --   --   --   PHOS 6.5* 6.5*  --   --   --   --   --    GFR: Estimated Creatinine Clearance: 11 mL/min (A) (by C-G formula based on SCr of 2.64 mg/dL (H)). Liver Function Tests: Recent Labs  Lab 05/31/18 2000 06/01/18 0315  AST 10* 12*  ALT 16 15  ALKPHOS 73 73  BILITOT 0.9 0.6  PROT 7.7 7.2  ALBUMIN 3.0* 3.1*   No results for input(s): LIPASE, AMYLASE in the last 168 hours. No results for input(s): AMMONIA in the last 168 hours. Coagulation Profile: Recent Labs  Lab 06/02/18 0933 06/03/18 0628 06/04/18 0756 06/05/18 1203 06/06/18 0309  INR 2.42 1.73 1.62 1.89 2.07   Cardiac Enzymes: No results for input(s): CKTOTAL, CKMB, CKMBINDEX, TROPONINI in the last 168 hours. BNP (last 3 results) No results for input(s): PROBNP in the last 8760 hours. HbA1C: No results for input(s): HGBA1C in the last 72 hours. CBG: No results for input(s): GLUCAP in the last 168 hours. Lipid Profile: No results for input(s): CHOL, HDL, LDLCALC, TRIG, CHOLHDL, LDLDIRECT in the last 72 hours. Thyroid Function Tests: No results for input(s): TSH, T4TOTAL, FREET4, T3FREE, THYROIDAB in the last 72 hours. Anemia Panel: No results for input(s): VITAMINB12, FOLATE, FERRITIN, TIBC, IRON, RETICCTPCT in the last 72 hours. Sepsis Labs: No results for input(s): PROCALCITON, LATICACIDVEN in the last 168 hours.  Recent Results (from the past 240 hour(s))  Urine culture     Status: Abnormal   Collection Time: 05/31/18  5:13 PM  Result Value Ref Range Status   Specimen Description  URINE, CLEAN CATCH  Final   Special Requests NONE  Final   Culture (A)  Final    <10,000 COLONIES/mL INSIGNIFICANT GROWTH Performed at [facility], [address], [place], [place] [address]    Report Status 06/02/2018 FINAL  Final         Radiology Studies: No results found.      Scheduled Meds: . atorvastatin  20 mg Oral Daily  . brimonidine  1 drop Right Eye BID  . diltiazem  180 mg Oral Daily  . dorzolamide  1 drop Both Eyes BID  . feeding supplement (ENSURE ENLIVE)  237 mL Oral BID BM  . hydrALAZINE  25 mg Oral BID  . latanoprost  1 drop Both Eyes QHS  . metoprolol succinate  25 mg Oral Daily  . potassium chloride  40 mEq Oral Q4H  . senna  1 tablet Oral BID  . warfarin  3 mg Oral ONCE-1800  . Warfarin - Pharmacist Dosing Inpatient   Does not apply q1800   Continuous Infusions:    LOS: 6 days    Time spent: 25 mins.More than 50% of that time was spent in counseling and/or coordination of care.      Shelly Coss, MD [facility] Pager [phone]  If 7PM-7AM, please contact night-coverage [email or web address] Password [email or web address] 06/06/2018, 12:21 PM

## 2018-06-06 NOTE — Clinical Social Work Placement (Signed)
   CLINICAL SOCIAL WORK PLACEMENT  NOTE 06/06/18 - DISCHARGED TO [facility]IA AMBULANCE  Date:  06/06/2018  Patient Details  Name: Kelli Peterson MRN: [id number] Date of Birth: 03-08-1930  Clinical Social Work is seeking post-discharge placement for this patient at the [facility] level of care (*CSW will initial, date and re-position this form in  chart as items are completed):  No(Daughter provided CSW with facility preference)   Patient/family provided with [facility] Work Department's list of facilities offering this level of care within the geographic area requested by the patient (or if unable, by the patient's family).  Yes   Patient/family informed of their freedom to choose among providers that offer the needed level of care, that participate in Medicare, Medicaid or managed care program needed by the patient, have an available bed and are willing to accept the patient.  No   Patient/family informed of [facility]'s ownership interest in [facility] and [facility], as well as of the fact that they are under no obligation to receive care at these facilities.  PASRR submitted to EDS on       PASRR number received on       Existing PASRR number confirmed on 06/02/18     FL2 transmitted to all facilities in geographic area requested by pt/family on 06/02/18     FL2 transmitted to all facilities within larger geographic area on       Patient informed that his/her managed care company has contracts with or will negotiate with certain facilities, including the following:        Yes   Patient/family informed of bed offers received.  Patient [facility] bed at [facility]     Physician recommends and patient chooses bed at      Patient to be transferred to [facility] on 06/06/18.  Patient to be transferred to facility by Ambulance     Patient family notified on 06/06/18 of transfer.  Name of family member notified:  Daughter Glenna Durand at the bedside     PHYSICIAN       Additional Comment:    _______________________________________________ Sable Feil, [place] 06/06/2018, 5:07 PM

## 2018-06-07 DIAGNOSIS — N179 Acute kidney failure, unspecified: Secondary | ICD-10-CM | POA: Diagnosis not present

## 2018-06-07 DIAGNOSIS — R197 Diarrhea, unspecified: Secondary | ICD-10-CM | POA: Diagnosis not present

## 2018-06-07 DIAGNOSIS — I482 Chronic atrial fibrillation: Secondary | ICD-10-CM | POA: Diagnosis not present

## 2018-06-07 DIAGNOSIS — I10 Essential (primary) hypertension: Secondary | ICD-10-CM | POA: Diagnosis not present

## 2018-06-13 DIAGNOSIS — K5900 Constipation, unspecified: Secondary | ICD-10-CM | POA: Diagnosis not present

## 2018-06-13 DIAGNOSIS — I482 Chronic atrial fibrillation: Secondary | ICD-10-CM | POA: Diagnosis not present

## 2018-06-14 DIAGNOSIS — E876 Hypokalemia: Secondary | ICD-10-CM | POA: Diagnosis not present

## 2018-06-14 DIAGNOSIS — N179 Acute kidney failure, unspecified: Secondary | ICD-10-CM | POA: Diagnosis not present

## 2018-06-14 DIAGNOSIS — K5900 Constipation, unspecified: Secondary | ICD-10-CM | POA: Diagnosis not present

## 2018-06-16 DIAGNOSIS — I482 Chronic atrial fibrillation: Secondary | ICD-10-CM | POA: Diagnosis not present

## 2018-06-16 DIAGNOSIS — I10 Essential (primary) hypertension: Secondary | ICD-10-CM | POA: Diagnosis not present

## 2018-06-16 DIAGNOSIS — N179 Acute kidney failure, unspecified: Secondary | ICD-10-CM | POA: Diagnosis not present

## 2018-06-16 DIAGNOSIS — E876 Hypokalemia: Secondary | ICD-10-CM | POA: Diagnosis not present

## 2018-06-19 DIAGNOSIS — Z8673 Personal history of transient ischemic attack (TIA), and cerebral infarction without residual deficits: Secondary | ICD-10-CM | POA: Diagnosis not present

## 2018-06-19 DIAGNOSIS — I110 Hypertensive heart disease with heart failure: Secondary | ICD-10-CM | POA: Diagnosis not present

## 2018-06-19 DIAGNOSIS — Z7901 Long term (current) use of anticoagulants: Secondary | ICD-10-CM | POA: Diagnosis not present

## 2018-06-19 DIAGNOSIS — I482 Chronic atrial fibrillation: Secondary | ICD-10-CM | POA: Diagnosis not present

## 2018-06-19 DIAGNOSIS — J449 Chronic obstructive pulmonary disease, unspecified: Secondary | ICD-10-CM | POA: Diagnosis not present

## 2018-06-19 DIAGNOSIS — E46 Unspecified protein-calorie malnutrition: Secondary | ICD-10-CM | POA: Diagnosis not present

## 2018-06-19 DIAGNOSIS — I5032 Chronic diastolic (congestive) heart failure: Secondary | ICD-10-CM | POA: Diagnosis not present

## 2018-06-19 DIAGNOSIS — E785 Hyperlipidemia, unspecified: Secondary | ICD-10-CM | POA: Diagnosis not present

## 2018-06-20 DIAGNOSIS — R1310 Dysphagia, unspecified: Secondary | ICD-10-CM | POA: Diagnosis not present

## 2018-06-20 DIAGNOSIS — Z7901 Long term (current) use of anticoagulants: Secondary | ICD-10-CM | POA: Diagnosis not present

## 2018-06-20 DIAGNOSIS — Z09 Encounter for follow-up examination after completed treatment for conditions other than malignant neoplasm: Secondary | ICD-10-CM | POA: Diagnosis not present

## 2018-06-20 DIAGNOSIS — I4891 Unspecified atrial fibrillation: Secondary | ICD-10-CM | POA: Diagnosis not present

## 2018-06-20 DIAGNOSIS — R531 Weakness: Secondary | ICD-10-CM | POA: Diagnosis not present

## 2018-06-20 DIAGNOSIS — N179 Acute kidney failure, unspecified: Secondary | ICD-10-CM | POA: Diagnosis not present

## 2018-06-21 DIAGNOSIS — I482 Chronic atrial fibrillation: Secondary | ICD-10-CM | POA: Diagnosis not present

## 2018-06-21 DIAGNOSIS — Z7901 Long term (current) use of anticoagulants: Secondary | ICD-10-CM | POA: Diagnosis not present

## 2018-06-21 DIAGNOSIS — I110 Hypertensive heart disease with heart failure: Secondary | ICD-10-CM | POA: Diagnosis not present

## 2018-06-21 DIAGNOSIS — I5032 Chronic diastolic (congestive) heart failure: Secondary | ICD-10-CM | POA: Diagnosis not present

## 2018-06-21 DIAGNOSIS — Z8673 Personal history of transient ischemic attack (TIA), and cerebral infarction without residual deficits: Secondary | ICD-10-CM | POA: Diagnosis not present

## 2018-06-21 DIAGNOSIS — E785 Hyperlipidemia, unspecified: Secondary | ICD-10-CM | POA: Diagnosis not present

## 2018-06-21 DIAGNOSIS — J449 Chronic obstructive pulmonary disease, unspecified: Secondary | ICD-10-CM | POA: Diagnosis not present

## 2018-06-21 DIAGNOSIS — E46 Unspecified protein-calorie malnutrition: Secondary | ICD-10-CM | POA: Diagnosis not present

## 2018-06-22 DIAGNOSIS — E46 Unspecified protein-calorie malnutrition: Secondary | ICD-10-CM | POA: Diagnosis not present

## 2018-06-22 DIAGNOSIS — J449 Chronic obstructive pulmonary disease, unspecified: Secondary | ICD-10-CM | POA: Diagnosis not present

## 2018-06-22 DIAGNOSIS — I482 Chronic atrial fibrillation: Secondary | ICD-10-CM | POA: Diagnosis not present

## 2018-06-22 DIAGNOSIS — Z8673 Personal history of transient ischemic attack (TIA), and cerebral infarction without residual deficits: Secondary | ICD-10-CM | POA: Diagnosis not present

## 2018-06-22 DIAGNOSIS — Z7901 Long term (current) use of anticoagulants: Secondary | ICD-10-CM | POA: Diagnosis not present

## 2018-06-22 DIAGNOSIS — I110 Hypertensive heart disease with heart failure: Secondary | ICD-10-CM | POA: Diagnosis not present

## 2018-06-22 DIAGNOSIS — E785 Hyperlipidemia, unspecified: Secondary | ICD-10-CM | POA: Diagnosis not present

## 2018-06-22 DIAGNOSIS — I5032 Chronic diastolic (congestive) heart failure: Secondary | ICD-10-CM | POA: Diagnosis not present

## 2018-06-26 DIAGNOSIS — I110 Hypertensive heart disease with heart failure: Secondary | ICD-10-CM | POA: Diagnosis not present

## 2018-06-26 DIAGNOSIS — I482 Chronic atrial fibrillation: Secondary | ICD-10-CM | POA: Diagnosis not present

## 2018-06-26 DIAGNOSIS — E785 Hyperlipidemia, unspecified: Secondary | ICD-10-CM | POA: Diagnosis not present

## 2018-06-26 DIAGNOSIS — Z7901 Long term (current) use of anticoagulants: Secondary | ICD-10-CM | POA: Diagnosis not present

## 2018-06-26 DIAGNOSIS — I5032 Chronic diastolic (congestive) heart failure: Secondary | ICD-10-CM | POA: Diagnosis not present

## 2018-06-26 DIAGNOSIS — E46 Unspecified protein-calorie malnutrition: Secondary | ICD-10-CM | POA: Diagnosis not present

## 2018-06-26 DIAGNOSIS — J449 Chronic obstructive pulmonary disease, unspecified: Secondary | ICD-10-CM | POA: Diagnosis not present

## 2018-06-26 DIAGNOSIS — Z8673 Personal history of transient ischemic attack (TIA), and cerebral infarction without residual deficits: Secondary | ICD-10-CM | POA: Diagnosis not present

## 2018-06-27 DIAGNOSIS — J449 Chronic obstructive pulmonary disease, unspecified: Secondary | ICD-10-CM | POA: Diagnosis not present

## 2018-06-27 DIAGNOSIS — I482 Chronic atrial fibrillation: Secondary | ICD-10-CM | POA: Diagnosis not present

## 2018-06-27 DIAGNOSIS — I10 Essential (primary) hypertension: Secondary | ICD-10-CM | POA: Diagnosis not present

## 2018-06-27 DIAGNOSIS — Z8673 Personal history of transient ischemic attack (TIA), and cerebral infarction without residual deficits: Secondary | ICD-10-CM | POA: Diagnosis not present

## 2018-06-27 DIAGNOSIS — E46 Unspecified protein-calorie malnutrition: Secondary | ICD-10-CM | POA: Diagnosis not present

## 2018-06-27 DIAGNOSIS — N19 Unspecified kidney failure: Secondary | ICD-10-CM | POA: Diagnosis not present

## 2018-06-27 DIAGNOSIS — I4891 Unspecified atrial fibrillation: Secondary | ICD-10-CM | POA: Diagnosis not present

## 2018-06-27 DIAGNOSIS — E785 Hyperlipidemia, unspecified: Secondary | ICD-10-CM | POA: Diagnosis not present

## 2018-06-27 DIAGNOSIS — I110 Hypertensive heart disease with heart failure: Secondary | ICD-10-CM | POA: Diagnosis not present

## 2018-06-27 DIAGNOSIS — Z23 Encounter for immunization: Secondary | ICD-10-CM | POA: Diagnosis not present

## 2018-06-27 DIAGNOSIS — I5032 Chronic diastolic (congestive) heart failure: Secondary | ICD-10-CM | POA: Diagnosis not present

## 2018-06-27 DIAGNOSIS — M79651 Pain in right thigh: Secondary | ICD-10-CM | POA: Diagnosis not present

## 2018-06-27 DIAGNOSIS — Z7901 Long term (current) use of anticoagulants: Secondary | ICD-10-CM | POA: Diagnosis not present

## 2018-06-29 DIAGNOSIS — I110 Hypertensive heart disease with heart failure: Secondary | ICD-10-CM | POA: Diagnosis not present

## 2018-06-29 DIAGNOSIS — Z8673 Personal history of transient ischemic attack (TIA), and cerebral infarction without residual deficits: Secondary | ICD-10-CM | POA: Diagnosis not present

## 2018-06-29 DIAGNOSIS — I5032 Chronic diastolic (congestive) heart failure: Secondary | ICD-10-CM | POA: Diagnosis not present

## 2018-06-29 DIAGNOSIS — E46 Unspecified protein-calorie malnutrition: Secondary | ICD-10-CM | POA: Diagnosis not present

## 2018-06-29 DIAGNOSIS — E785 Hyperlipidemia, unspecified: Secondary | ICD-10-CM | POA: Diagnosis not present

## 2018-06-29 DIAGNOSIS — Z7901 Long term (current) use of anticoagulants: Secondary | ICD-10-CM | POA: Diagnosis not present

## 2018-06-29 DIAGNOSIS — I482 Chronic atrial fibrillation: Secondary | ICD-10-CM | POA: Diagnosis not present

## 2018-06-29 DIAGNOSIS — J449 Chronic obstructive pulmonary disease, unspecified: Secondary | ICD-10-CM | POA: Diagnosis not present

## 2018-06-30 DIAGNOSIS — E785 Hyperlipidemia, unspecified: Secondary | ICD-10-CM | POA: Diagnosis not present

## 2018-06-30 DIAGNOSIS — I5032 Chronic diastolic (congestive) heart failure: Secondary | ICD-10-CM | POA: Diagnosis not present

## 2018-06-30 DIAGNOSIS — Z7901 Long term (current) use of anticoagulants: Secondary | ICD-10-CM | POA: Diagnosis not present

## 2018-06-30 DIAGNOSIS — J449 Chronic obstructive pulmonary disease, unspecified: Secondary | ICD-10-CM | POA: Diagnosis not present

## 2018-06-30 DIAGNOSIS — I482 Chronic atrial fibrillation: Secondary | ICD-10-CM | POA: Diagnosis not present

## 2018-06-30 DIAGNOSIS — Z8673 Personal history of transient ischemic attack (TIA), and cerebral infarction without residual deficits: Secondary | ICD-10-CM | POA: Diagnosis not present

## 2018-06-30 DIAGNOSIS — E46 Unspecified protein-calorie malnutrition: Secondary | ICD-10-CM | POA: Diagnosis not present

## 2018-06-30 DIAGNOSIS — I110 Hypertensive heart disease with heart failure: Secondary | ICD-10-CM | POA: Diagnosis not present

## 2018-07-03 DIAGNOSIS — J449 Chronic obstructive pulmonary disease, unspecified: Secondary | ICD-10-CM | POA: Diagnosis not present

## 2018-07-03 DIAGNOSIS — I110 Hypertensive heart disease with heart failure: Secondary | ICD-10-CM | POA: Diagnosis not present

## 2018-07-03 DIAGNOSIS — E46 Unspecified protein-calorie malnutrition: Secondary | ICD-10-CM | POA: Diagnosis not present

## 2018-07-03 DIAGNOSIS — Z8673 Personal history of transient ischemic attack (TIA), and cerebral infarction without residual deficits: Secondary | ICD-10-CM | POA: Diagnosis not present

## 2018-07-03 DIAGNOSIS — E785 Hyperlipidemia, unspecified: Secondary | ICD-10-CM | POA: Diagnosis not present

## 2018-07-03 DIAGNOSIS — I482 Chronic atrial fibrillation: Secondary | ICD-10-CM | POA: Diagnosis not present

## 2018-07-03 DIAGNOSIS — I5032 Chronic diastolic (congestive) heart failure: Secondary | ICD-10-CM | POA: Diagnosis not present

## 2018-07-03 DIAGNOSIS — Z7901 Long term (current) use of anticoagulants: Secondary | ICD-10-CM | POA: Diagnosis not present

## 2018-07-05 DIAGNOSIS — E46 Unspecified protein-calorie malnutrition: Secondary | ICD-10-CM | POA: Diagnosis not present

## 2018-07-05 DIAGNOSIS — I5032 Chronic diastolic (congestive) heart failure: Secondary | ICD-10-CM | POA: Diagnosis not present

## 2018-07-05 DIAGNOSIS — Z7901 Long term (current) use of anticoagulants: Secondary | ICD-10-CM | POA: Diagnosis not present

## 2018-07-05 DIAGNOSIS — E785 Hyperlipidemia, unspecified: Secondary | ICD-10-CM | POA: Diagnosis not present

## 2018-07-05 DIAGNOSIS — J449 Chronic obstructive pulmonary disease, unspecified: Secondary | ICD-10-CM | POA: Diagnosis not present

## 2018-07-05 DIAGNOSIS — Z8673 Personal history of transient ischemic attack (TIA), and cerebral infarction without residual deficits: Secondary | ICD-10-CM | POA: Diagnosis not present

## 2018-07-05 DIAGNOSIS — I110 Hypertensive heart disease with heart failure: Secondary | ICD-10-CM | POA: Diagnosis not present

## 2018-07-06 DIAGNOSIS — J449 Chronic obstructive pulmonary disease, unspecified: Secondary | ICD-10-CM | POA: Diagnosis not present

## 2018-07-06 DIAGNOSIS — I5032 Chronic diastolic (congestive) heart failure: Secondary | ICD-10-CM | POA: Diagnosis not present

## 2018-07-06 DIAGNOSIS — Z7901 Long term (current) use of anticoagulants: Secondary | ICD-10-CM | POA: Diagnosis not present

## 2018-07-06 DIAGNOSIS — E46 Unspecified protein-calorie malnutrition: Secondary | ICD-10-CM | POA: Diagnosis not present

## 2018-07-06 DIAGNOSIS — Z8673 Personal history of transient ischemic attack (TIA), and cerebral infarction without residual deficits: Secondary | ICD-10-CM | POA: Diagnosis not present

## 2018-07-06 DIAGNOSIS — E785 Hyperlipidemia, unspecified: Secondary | ICD-10-CM | POA: Diagnosis not present

## 2018-07-06 DIAGNOSIS — I482 Chronic atrial fibrillation, unspecified: Secondary | ICD-10-CM | POA: Diagnosis not present

## 2018-07-06 DIAGNOSIS — I110 Hypertensive heart disease with heart failure: Secondary | ICD-10-CM | POA: Diagnosis not present

## 2018-07-13 DIAGNOSIS — H35351 Cystoid macular degeneration, right eye: Secondary | ICD-10-CM | POA: Diagnosis not present

## 2018-07-13 DIAGNOSIS — H353122 Nonexudative age-related macular degeneration, left eye, intermediate dry stage: Secondary | ICD-10-CM | POA: Diagnosis not present

## 2018-07-13 DIAGNOSIS — Z7901 Long term (current) use of anticoagulants: Secondary | ICD-10-CM | POA: Diagnosis not present

## 2018-07-13 DIAGNOSIS — I4891 Unspecified atrial fibrillation: Secondary | ICD-10-CM | POA: Diagnosis not present

## 2018-07-13 DIAGNOSIS — H348312 Tributary (branch) retinal vein occlusion, right eye, stable: Secondary | ICD-10-CM | POA: Diagnosis not present

## 2018-07-13 DIAGNOSIS — H353212 Exudative age-related macular degeneration, right eye, with inactive choroidal neovascularization: Secondary | ICD-10-CM | POA: Diagnosis not present

## 2018-07-18 DIAGNOSIS — Z1231 Encounter for screening mammogram for malignant neoplasm of breast: Secondary | ICD-10-CM | POA: Diagnosis not present

## 2018-07-27 DIAGNOSIS — H401122 Primary open-angle glaucoma, left eye, moderate stage: Secondary | ICD-10-CM | POA: Diagnosis not present

## 2018-07-27 DIAGNOSIS — H401113 Primary open-angle glaucoma, right eye, severe stage: Secondary | ICD-10-CM | POA: Diagnosis not present

## 2018-07-27 DIAGNOSIS — H353132 Nonexudative age-related macular degeneration, bilateral, intermediate dry stage: Secondary | ICD-10-CM | POA: Diagnosis not present

## 2018-07-27 DIAGNOSIS — H34831 Tributary (branch) retinal vein occlusion, right eye, with macular edema: Secondary | ICD-10-CM | POA: Diagnosis not present

## 2018-08-10 DIAGNOSIS — I4891 Unspecified atrial fibrillation: Secondary | ICD-10-CM | POA: Diagnosis not present

## 2018-08-10 DIAGNOSIS — Z7901 Long term (current) use of anticoagulants: Secondary | ICD-10-CM | POA: Diagnosis not present

## 2018-08-25 DIAGNOSIS — Z7901 Long term (current) use of anticoagulants: Secondary | ICD-10-CM | POA: Diagnosis not present

## 2018-08-25 DIAGNOSIS — I4891 Unspecified atrial fibrillation: Secondary | ICD-10-CM | POA: Diagnosis not present

## 2018-09-14 DIAGNOSIS — I4891 Unspecified atrial fibrillation: Secondary | ICD-10-CM | POA: Diagnosis not present

## 2018-09-14 DIAGNOSIS — I10 Essential (primary) hypertension: Secondary | ICD-10-CM | POA: Diagnosis not present

## 2018-09-14 DIAGNOSIS — Z7901 Long term (current) use of anticoagulants: Secondary | ICD-10-CM | POA: Diagnosis not present

## 2018-10-05 DIAGNOSIS — I4891 Unspecified atrial fibrillation: Secondary | ICD-10-CM | POA: Diagnosis not present

## 2018-10-05 DIAGNOSIS — Z7901 Long term (current) use of anticoagulants: Secondary | ICD-10-CM | POA: Diagnosis not present

## 2018-10-20 DIAGNOSIS — I10 Essential (primary) hypertension: Secondary | ICD-10-CM | POA: Diagnosis not present

## 2018-10-20 DIAGNOSIS — N19 Unspecified kidney failure: Secondary | ICD-10-CM | POA: Diagnosis not present

## 2018-10-20 DIAGNOSIS — E7800 Pure hypercholesterolemia, unspecified: Secondary | ICD-10-CM | POA: Diagnosis not present

## 2018-11-02 DIAGNOSIS — I4891 Unspecified atrial fibrillation: Secondary | ICD-10-CM | POA: Diagnosis not present

## 2018-11-02 DIAGNOSIS — Z7901 Long term (current) use of anticoagulants: Secondary | ICD-10-CM | POA: Diagnosis not present

## 2018-12-01 DIAGNOSIS — I4891 Unspecified atrial fibrillation: Secondary | ICD-10-CM | POA: Diagnosis not present

## 2018-12-01 DIAGNOSIS — Z7901 Long term (current) use of anticoagulants: Secondary | ICD-10-CM | POA: Diagnosis not present

## 2019-01-04 DIAGNOSIS — Z7901 Long term (current) use of anticoagulants: Secondary | ICD-10-CM | POA: Diagnosis not present

## 2019-01-04 DIAGNOSIS — I4891 Unspecified atrial fibrillation: Secondary | ICD-10-CM | POA: Diagnosis not present

## 2019-02-08 DIAGNOSIS — I4891 Unspecified atrial fibrillation: Secondary | ICD-10-CM | POA: Diagnosis not present

## 2019-02-08 DIAGNOSIS — Z7901 Long term (current) use of anticoagulants: Secondary | ICD-10-CM | POA: Diagnosis not present

## 2019-03-15 DIAGNOSIS — I10 Essential (primary) hypertension: Secondary | ICD-10-CM | POA: Diagnosis not present

## 2019-03-15 DIAGNOSIS — Z7901 Long term (current) use of anticoagulants: Secondary | ICD-10-CM | POA: Diagnosis not present

## 2019-03-15 DIAGNOSIS — I4891 Unspecified atrial fibrillation: Secondary | ICD-10-CM | POA: Diagnosis not present

## 2019-04-12 DIAGNOSIS — I10 Essential (primary) hypertension: Secondary | ICD-10-CM | POA: Diagnosis not present

## 2019-04-12 DIAGNOSIS — I4891 Unspecified atrial fibrillation: Secondary | ICD-10-CM | POA: Diagnosis not present

## 2019-04-12 DIAGNOSIS — Z7901 Long term (current) use of anticoagulants: Secondary | ICD-10-CM | POA: Diagnosis not present

## 2019-04-16 DIAGNOSIS — H401122 Primary open-angle glaucoma, left eye, moderate stage: Secondary | ICD-10-CM | POA: Diagnosis not present

## 2019-04-16 DIAGNOSIS — Z961 Presence of intraocular lens: Secondary | ICD-10-CM | POA: Diagnosis not present

## 2019-04-16 DIAGNOSIS — H401113 Primary open-angle glaucoma, right eye, severe stage: Secondary | ICD-10-CM | POA: Diagnosis not present

## 2019-05-17 DIAGNOSIS — E039 Hypothyroidism, unspecified: Secondary | ICD-10-CM | POA: Diagnosis not present

## 2019-05-17 DIAGNOSIS — E785 Hyperlipidemia, unspecified: Secondary | ICD-10-CM | POA: Diagnosis not present

## 2019-05-17 DIAGNOSIS — I10 Essential (primary) hypertension: Secondary | ICD-10-CM | POA: Diagnosis not present

## 2019-05-17 DIAGNOSIS — Z7901 Long term (current) use of anticoagulants: Secondary | ICD-10-CM | POA: Diagnosis not present

## 2019-05-17 DIAGNOSIS — I4891 Unspecified atrial fibrillation: Secondary | ICD-10-CM | POA: Diagnosis not present

## 2019-05-24 DIAGNOSIS — I10 Essential (primary) hypertension: Secondary | ICD-10-CM | POA: Diagnosis not present

## 2019-05-24 DIAGNOSIS — E7800 Pure hypercholesterolemia, unspecified: Secondary | ICD-10-CM | POA: Diagnosis not present

## 2019-05-24 DIAGNOSIS — R739 Hyperglycemia, unspecified: Secondary | ICD-10-CM | POA: Diagnosis not present

## 2019-05-24 DIAGNOSIS — I4891 Unspecified atrial fibrillation: Secondary | ICD-10-CM | POA: Diagnosis not present

## 2019-05-24 DIAGNOSIS — R0609 Other forms of dyspnea: Secondary | ICD-10-CM | POA: Diagnosis not present

## 2019-06-01 DIAGNOSIS — H401122 Primary open-angle glaucoma, left eye, moderate stage: Secondary | ICD-10-CM | POA: Diagnosis not present

## 2019-06-01 DIAGNOSIS — H401113 Primary open-angle glaucoma, right eye, severe stage: Secondary | ICD-10-CM | POA: Diagnosis not present

## 2019-06-14 DIAGNOSIS — Z7901 Long term (current) use of anticoagulants: Secondary | ICD-10-CM | POA: Diagnosis not present

## 2019-06-14 DIAGNOSIS — I10 Essential (primary) hypertension: Secondary | ICD-10-CM | POA: Diagnosis not present

## 2019-06-14 DIAGNOSIS — I4891 Unspecified atrial fibrillation: Secondary | ICD-10-CM | POA: Diagnosis not present

## 2019-07-12 DIAGNOSIS — I4891 Unspecified atrial fibrillation: Secondary | ICD-10-CM | POA: Diagnosis not present

## 2019-07-12 DIAGNOSIS — Z7901 Long term (current) use of anticoagulants: Secondary | ICD-10-CM | POA: Diagnosis not present

## 2019-07-31 DIAGNOSIS — Z1231 Encounter for screening mammogram for malignant neoplasm of breast: Secondary | ICD-10-CM | POA: Diagnosis not present

## 2019-08-07 ENCOUNTER — Emergency Department (HOSPITAL_COMMUNITY): Payer: Medicare Other

## 2019-08-07 ENCOUNTER — Emergency Department (HOSPITAL_COMMUNITY)
Admission: EM | Admit: 2019-08-07 | Discharge: 2019-08-07 | Disposition: A | Discharge status: Home / Self Care | Payer: Medicare Other | Attending: Emergency Medicine | Admitting: Emergency Medicine

## 2019-08-07 DIAGNOSIS — W19XXXA Unspecified fall, initial encounter: Secondary | ICD-10-CM | POA: Diagnosis not present

## 2019-08-07 DIAGNOSIS — W010XXA Fall on same level from slipping, tripping and stumbling without subsequent striking against object, initial encounter: Secondary | ICD-10-CM | POA: Insufficient documentation

## 2019-08-07 DIAGNOSIS — M545 Low back pain: Secondary | ICD-10-CM | POA: Insufficient documentation

## 2019-08-07 DIAGNOSIS — S20229A Contusion of unspecified back wall of thorax, initial encounter: Secondary | ICD-10-CM | POA: Diagnosis not present

## 2019-08-07 DIAGNOSIS — M542 Cervicalgia: Secondary | ICD-10-CM | POA: Insufficient documentation

## 2019-08-07 DIAGNOSIS — I509 Heart failure, unspecified: Secondary | ICD-10-CM | POA: Diagnosis not present

## 2019-08-07 DIAGNOSIS — S300XXA Contusion of lower back and pelvis, initial encounter: Secondary | ICD-10-CM | POA: Diagnosis not present

## 2019-08-07 DIAGNOSIS — J449 Chronic obstructive pulmonary disease, unspecified: Secondary | ICD-10-CM | POA: Insufficient documentation

## 2019-08-07 DIAGNOSIS — Z7901 Long term (current) use of anticoagulants: Secondary | ICD-10-CM | POA: Diagnosis not present

## 2019-08-07 DIAGNOSIS — Y9389 Activity, other specified: Secondary | ICD-10-CM | POA: Diagnosis not present

## 2019-08-07 DIAGNOSIS — S32010A Wedge compression fracture of first lumbar vertebra, initial encounter for closed fracture: Secondary | ICD-10-CM | POA: Diagnosis not present

## 2019-08-07 DIAGNOSIS — Y999 Unspecified external cause status: Secondary | ICD-10-CM | POA: Diagnosis not present

## 2019-08-07 DIAGNOSIS — M5489 Other dorsalgia: Secondary | ICD-10-CM | POA: Diagnosis not present

## 2019-08-07 DIAGNOSIS — I10 Essential (primary) hypertension: Secondary | ICD-10-CM | POA: Diagnosis not present

## 2019-08-07 DIAGNOSIS — R0781 Pleurodynia: Secondary | ICD-10-CM | POA: Diagnosis not present

## 2019-08-07 DIAGNOSIS — Y929 Unspecified place or not applicable: Secondary | ICD-10-CM | POA: Insufficient documentation

## 2019-08-07 DIAGNOSIS — Z743 Need for continuous supervision: Secondary | ICD-10-CM | POA: Diagnosis not present

## 2019-08-07 DIAGNOSIS — I110 Hypertensive heart disease with heart failure: Secondary | ICD-10-CM | POA: Diagnosis not present

## 2019-08-07 DIAGNOSIS — S3992XA Unspecified injury of lower back, initial encounter: Secondary | ICD-10-CM | POA: Diagnosis present

## 2019-08-07 DIAGNOSIS — S299XXA Unspecified injury of thorax, initial encounter: Secondary | ICD-10-CM | POA: Diagnosis not present

## 2019-08-07 DIAGNOSIS — Z79899 Other long term (current) drug therapy: Secondary | ICD-10-CM | POA: Diagnosis not present

## 2019-08-07 DIAGNOSIS — S22000A Wedge compression fracture of unspecified thoracic vertebra, initial encounter for closed fracture: Secondary | ICD-10-CM | POA: Diagnosis not present

## 2019-08-07 LAB — PROTIME-INR
INR: 2.1 — ABNORMAL HIGH (ref 0.8–1.2)
Prothrombin Time: 22.9 seconds — ABNORMAL HIGH (ref 11.4–15.2)

## 2019-08-07 LAB — CBC WITH DIFFERENTIAL/PLATELET
Abs Immature Granulocytes: 0.03 10*3/uL (ref 0.00–0.07)
Basophils Absolute: 0 10*3/uL (ref 0.0–0.1)
Basophils Relative: 0 %
Eosinophils Absolute: 0 10*3/uL (ref 0.0–0.5)
Eosinophils Relative: 0 %
HCT: 41.2 % (ref 36.0–46.0)
Hemoglobin: 13.2 g/dL (ref 12.0–15.0)
Immature Granulocytes: 0 %
Lymphocytes Relative: 7 %
Lymphs Abs: 0.8 10*3/uL (ref 0.7–4.0)
MCH: 28.4 pg (ref 26.0–34.0)
MCHC: 32 g/dL (ref 30.0–36.0)
MCV: 88.6 fL (ref 80.0–100.0)
Monocytes Absolute: 0.8 10*3/uL (ref 0.1–1.0)
Monocytes Relative: 7 %
Neutro Abs: 9.3 10*3/uL — ABNORMAL HIGH (ref 1.7–7.7)
Neutrophils Relative %: 86 %
Platelets: 212 10*3/uL (ref 150–400)
RBC: 4.65 MIL/uL (ref 3.87–5.11)
RDW: 15.7 % — ABNORMAL HIGH (ref 11.5–15.5)
WBC: 10.9 10*3/uL — ABNORMAL HIGH (ref 4.0–10.5)
nRBC: 0 % (ref 0.0–0.2)

## 2019-08-07 LAB — BASIC METABOLIC PANEL
Anion gap: 9 (ref 5–15)
BUN: 14 mg/dL (ref 8–23)
CO2: 23 mmol/L (ref 22–32)
Calcium: 8.8 mg/dL — ABNORMAL LOW (ref 8.9–10.3)
Chloride: 108 mmol/L (ref 98–111)
Creatinine, Ser: 0.89 mg/dL (ref 0.44–1.00)
GFR calc Af Amer: >60 mL/min (ref >60)
GFR calc non Af Amer: 58 mL/min — ABNORMAL LOW (ref >60)
Glucose, Bld: 127 mg/dL — ABNORMAL HIGH (ref 70–99)
Potassium: 4.5 mmol/L (ref 3.5–5.1)
Sodium: 140 mmol/L (ref 135–145)

## 2019-08-07 MED ORDER — HYDROCODONE-ACETAMINOPHEN 5-325 MG PO TABS
1.0000 | ORAL_TABLET | Freq: Once | ORAL | Status: AC
  Administered 2019-08-07: 16:00:00 1 via ORAL
  Filled 2019-08-07: qty 1

## 2019-08-07 MED ORDER — HYDROCODONE-ACETAMINOPHEN 5-325 MG PO TABS: 2.0000 | ORAL_TABLET | Freq: Four times a day (QID) | ORAL | 0 refills | Status: DC | PRN

## 2019-08-07 MED ORDER — LORAZEPAM 2 MG/ML IJ SOLN
0.5000 mg | Freq: Once | INTRAMUSCULAR | Status: AC
  Administered 2019-08-07: 0.5 mg via INTRAVENOUS
  Filled 2019-08-07: qty 1

## 2019-08-07 NOTE — ED Provider Notes (Signed)
[facility] EMERGENCY DEPARTMENT Provider Note   CSN: [id number] Arrival date & time: 08/07/19  1323     History   Chief Complaint Chief Complaint  Patient presents with   Back Pain   Fall    HPI Kelli Peterson is a 85 y.o. female.     HPI  85 year old female presenting after a fall.  She fell earlier today while reaching for a shampoo bottle.  She is having pain in her mid to lower back and along her left side.  She is anticoagulated for history of atrial fibrillation.  She denies hitting her head though denies any headache or neck pain.  Denies any hip or lower extremity pain.  Past Medical History:  Diagnosis Date   Adenomatous colon polyp 2007   Atrial fibrillation ([place])    Barrett's esophagus 2007   Cerebellar hemorrhage (HCC)    Cervical cancer (HCC)    CHF (congestive heart failure) (HCC)    Closed pelvic fracture (HCC)    CVD (cardiovascular disease)    DVT (deep venous thrombosis) (HCC)    Fibrocystic breast disease    Fracture of pubic ramus (HCC)    Hyperlipidemia    Hypertension    Iron deficiency anemia     Patient Active Problem List   Diagnosis Date Noted   Protein-calorie malnutrition, severe 06/02/2018   AKI (acute kidney injury) ([place]) 05/31/2018   Dehydration 05/31/2018   Essential hypertension 05/31/2018   Atrial fibrillation, chronic ([place]) 05/31/2018   COPD (chronic obstructive pulmonary disease) ([place]) 05/31/2018   Glaucoma 05/31/2018   Hyperlipidemia 05/31/2018   Acute renal failure (ARF) ([place]) 05/31/2018   Constipation 05/31/2018   Acute right hip pain    Atrial fibrillation with RVR ([place])    Pelvic fracture, closed, initial encounter 02/15/2016   Fracture of pubic ramus ([place]) 02/15/2016    Past Surgical History:  Procedure Laterality Date   CATARACT EXTRACTION, BILATERAL  2010   TOTAL ABDOMINAL HYSTERECTOMY  1964     OB History   No obstetric history on file.      Home  Medications    Prior to Admission medications   Medication Sig Start Date End Date Taking? Authorizing Provider  acetaminophen (TYLENOL) 500 MG tablet Take 500 mg by mouth every 6 (six) hours as needed for pain.    [provider]  alendronate (FOSAMAX) 70 MG tablet Take 70 mg by mouth every Thursday. Take with a full glass of water on an empty stomach.    [provider]  atorvastatin (LIPITOR) 20 MG tablet Take 20 mg by mouth daily.    [provider]  brimonidine (ALPHAGAN) 0.2 % ophthalmic solution Place 1 drop into the right eye 2 (two) times daily. 05/01/18   [provider]  diltiazem (TIAZAC) 180 MG 24 hr capsule Take 1 capsule (180 mg total) by mouth daily. 06/04/18   Shelly Coss, MD  dorzolamide (TRUSOPT) 2 % ophthalmic solution Place 1 drop into both eyes 2 (two) times daily.     [provider]  ferrous sulfate 325 (65 FE) MG tablet Take 325 mg by mouth daily with breakfast.    [provider]  hydrALAZINE (APRESOLINE) 25 MG tablet Take 1 tablet (25 mg total) by mouth 2 (two) times daily. 06/04/18   Shelly Coss, MD  latanoprost (XALATAN) 0.005 % ophthalmic solution Place 1 drop into both eyes at bedtime.     [provider]  metoprolol succinate (TOPROL-XL) 25 MG 24 hr  tablet Take 1 tablet (25 mg total) by mouth daily. 06/07/18   Shelly Coss, MD  Multiple Vitamin (MULTIVITAMIN WITH MINERALS) TABS tablet Take 1 tablet by mouth daily.    [provider]  oxybutynin (DITROPAN) 5 MG tablet Take 5 mg by mouth 2 (two) times daily as needed for bladder spasms.  05/01/18   [provider]  potassium chloride SA (K-DUR,KLOR-CON) 20 MEQ tablet Take 1 tablet (20 mEq total) by mouth daily for 14 days. 06/06/18 06/20/18  Shelly Coss, MD  warfarin (COUMADIN) 2 MG tablet Take 2-3 mg by mouth See admin instructions. Take 1 tablet (2mg ) every Sunday, Monday, Wednesday, and Friday. Then take 1 1/2 tablets (3mg ) every  Tuesday Thursday and Saturday.    [provider]    Family History Family History  Problem Relation Age of Onset   CVA Father    CVA Mother     Social History Social History   Tobacco Use   Smoking status: Never Smoker   Smokeless tobacco: Never Used  Substance Use Topics   Alcohol use: No   Drug use: No     Allergies   Penicillins and Zocor [simvastatin]   Review of Systems Review of Systems  All systems reviewed and negative, other than as noted in HPI.  Physical Exam Updated Vital Signs BP (!) 152/90 (BP Location: Left Arm)    Pulse (!) 123    Temp 97.9 F (36.6 C) (Oral)    Resp 20    Ht 5' (1.524 m)    Wt 51 kg    SpO2 94%    BMI 21.96 kg/m   Physical Exam Vitals signs and nursing note reviewed.  Constitutional:      General: She is not in acute distress.    Appearance: She is well-developed.  HENT:     Head: Normocephalic and atraumatic.  Eyes:     General:        Right eye: No discharge.        Left eye: No discharge.     Conjunctiva/sclera: Conjunctivae normal.  Neck:     Musculoskeletal: Neck supple.  Cardiovascular:     Rate and Rhythm: Normal rate and regular rhythm.     Heart sounds: Normal heart sounds. No murmur. No friction rub. No gallop.   Pulmonary:     Effort: Pulmonary effort is normal. No respiratory distress.     Breath sounds: Normal breath sounds.  Abdominal:     General: There is no distension.     Palpations: Abdomen is soft.     Tenderness: There is no abdominal tenderness.  Musculoskeletal:        General: No tenderness.     Comments: Tenderness to palpation in the mid thoracic region to the upper lumbar region.  Tenderness extends along the left posterior chest.  There are no overlying skin changes.  No crepitus.  Breath sounds are clear bilaterally.  No apparent pain with range of motion of either upper or lower extremities.  No midline cervical spinal tenderness.  Abdomen is soft and nontender.  Skin:     General: Skin is warm and dry.  Neurological:     Mental Status: She is alert.  Psychiatric:        Behavior: Behavior normal.        Thought Content: Thought content normal.      ED Treatments / Results  Labs (all labs ordered are listed, but only abnormal results are displayed) Labs Reviewed  PROTIME-INR - Abnormal; Notable for the following components:      Result Value   Prothrombin Time 22.9 (*)    INR 2.1 (*)    All other components within normal limits  CBC WITH DIFFERENTIAL/PLATELET - Abnormal; Notable for the following components:   WBC 10.9 (*)    RDW 15.7 (*)    Neutro Abs 9.3 (*)    All other components within normal limits  BASIC METABOLIC PANEL - Abnormal; Notable for the following components:   Glucose, Bld 127 (*)    Calcium 8.8 (*)    GFR calc non Af Amer 58 (*)    All other components within normal limits    EKG EKG Interpretation  Date/Time:  Tuesday August 07 2019 13:27:59 EST Ventricular Rate:  133 PR Interval:    QRS Duration: 95 QT Interval:  344 QTC Calculation: 512 R Axis:   70 Text Interpretation: Atrial fibrillation Ventricular premature complex Repolarization abnormality, prob rate related Prolonged QT interval Confirmed by Virgel Manifold [phone]) on 08/07/2019 2:11:16 PM   Radiology Dg Ribs Unilateral W/chest Left  Result Date: 08/07/2019 CLINICAL DATA:  Fall today, left-sided rib pain, initial encounter. EXAM: LEFT RIBS AND CHEST - 3+ VIEW COMPARISON:  None. FINDINGS: Frontal view of the chest shows midline trachea. Heart is enlarged. Thoracic aorta is calcified and tortuous. Lungs are clear. No pleural fluid. Dedicated views of the left ribs show no definite fracture. Osteopenia. Multiple thoracolumbar compression deformities are noted. IMPRESSION: 1. No acute findings in the chest. 2. No definite acute left rib fracture. 3. Multiple thoracolumbar compression deformities. Please see dedicated spine radiographs done the same day. 4.  Aortic  atherosclerosis (ICD10-170.0). Electronically Signed   By: Lorin Picket M.D.   On: 08/07/2019 14:32   Dg Thoracic Spine 2 View  Result Date: 08/07/2019 CLINICAL DATA:  Fall. EXAM: THORACIC SPINE 2 VIEWS COMPARISON:  Thoracic spine series report 12/30/2017. FINDINGS: Diffuse severe osteopenia degenerative change. Multiple thoracic and lumbar spine compression fractures are again noted. Multiple thoracic spine for previously described on prior study of 12/30/2017. Compression fractures are most prominent at T7 and T9 and throughout the lumbar spine as previously described. These compression fractures may be old. An acute compression fracture cannot be excluded. IMPRESSION: Multiple thoracic and lumbar spine compression fractures, possibly old as described above as similar findings were described on prior thoracic spine series report of 12/30/2017. Electronically Signed   By: Marcello Moores  Register   On: 08/07/2019 14:34   Dg Lumbar Spine Complete  Result Date: 08/07/2019 CLINICAL DATA:  Fall EXAM: LUMBAR SPINE - COMPLETE 4+ VIEW COMPARISON:  CT 05/31/2018, 10/06/2012 FINDINGS: Bones appear osteopenic. Old right pubic rami fractures. Dense aortic atherosclerosis. Stable grade 1 anterolisthesis L5 on S1. Multiple chronic compression fractures at L1, L2, L3 and L4. IMPRESSION: Bones appear osteopenic. Multiple compression fractures L1 through L4 which appear chronic. Electronically Signed   By: Donavan Foil M.D.   On: 08/07/2019 14:33   Ct Cervical Spine Wo Contrast  Result Date: 08/07/2019 CLINICAL DATA:  [place].  Neck pain. EXAM: CT CERVICAL SPINE WITHOUT CONTRAST TECHNIQUE: Multidetector CT imaging of the cervical spine was performed without intravenous contrast. Multiplanar CT image reconstructions were also generated. COMPARISON:  None. FINDINGS: Alignment: Normal overall alignment. Skull base and vertebrae: The skull base C1 and C1-2 articulations are maintained. No acute cervical spine fracture is  identified. Moderate to advanced multilevel facet disease but no definite facet or laminar fractures. Moderate osteoporosis. Near the C3-4 right facet  joint there is a small intraspinal calcification. This could be benign dural calcification or possibly a rim calcified synovial cyst. Soft tissues and spinal canal: No prevertebral fluid or swelling. No visible canal hematoma. Disc levels: The spinal canal is fairly generous. Multilevel disc disease and facet disease is noted. There is mild multilevel foraminal stenosis but no significant spinal stenosis. Upper chest: Extensive vascular calcifications are noted. The visualized lung apices are grossly clear. Other: There is a compression deformity of T3 of uncertain age. I do not see an obvious paraspinal hematoma to suggest an acute injury. IMPRESSION: 1. Degenerative cervical spondylosis with multilevel disc disease and facet disease but no acute cervical spine fracture or canal compromise. 2. Compression deformity of T3 of uncertain age. Prior thoracic compression fractures are described on prior films which are not available for comparison. Electronically Signed   By: Marijo Sanes M.D.   On: 08/07/2019 14:48    Procedures Procedures (including critical care time)  Medications Ordered in ED Medications  LORazepam (ATIVAN) injection 0.5 mg (has no administration in time range)  HYDROcodone-acetaminophen (NORCO/VICODIN) 5-325 MG per tablet 1 tablet (has no administration in time range)     Initial Impression / Assessment and Plan / ED Course  I have reviewed the triage vital signs and the nursing notes.  Pertinent labs & imaging results that were available during my care of the patient were reviewed by me and considered in my medical decision making (see chart for details).        89yF with back pain after mechanical fall. Imaging with likely chronic compression fxs. Not clearly new. No acute neuro complaints. Anticoagulated.  CTs ok.  Symptomatic tx. Activity as tolerated.   Final Clinical Impressions(s) / ED Diagnoses   Final diagnoses:  Fall, initial encounter  Contusion of back, unspecified laterality, initial encounter    ED Discharge Orders    None       Virgel Manifold, MD 08/09/19 [phone]

## 2019-08-07 NOTE — ED Triage Notes (Signed)
From home, lives with daughter.  Fell yesterday in [place] landing onto her back with hx of frequent falls.  C/o lower back increasing this AM, used heating pad without relief.  Redness noted to L flank d/t heating pad, no other noted bruising.  Alert and oriented x 4.

## 2019-08-07 NOTE — ED Triage Notes (Signed)
No LOC noted yesterday, is on coumadin.

## 2019-08-07 NOTE — ED Triage Notes (Signed)
Pt lives in [place] with pts daughter. States she fell earlier and hit her back and now has radiating pain on the middle and left side lateral to chest. Able to follow commands, A&O4

## 2019-08-07 NOTE — ED Notes (Signed)
Plans to get flu vaccine

## 2019-08-07 NOTE — ED Notes (Signed)
Help get patient undress on the monitor patient is resting with call bell in reach 

## 2019-08-16 DIAGNOSIS — I10 Essential (primary) hypertension: Secondary | ICD-10-CM | POA: Diagnosis not present

## 2019-08-16 DIAGNOSIS — Z7901 Long term (current) use of anticoagulants: Secondary | ICD-10-CM | POA: Diagnosis not present

## 2019-08-16 DIAGNOSIS — I4891 Unspecified atrial fibrillation: Secondary | ICD-10-CM | POA: Diagnosis not present

## 2019-08-16 DIAGNOSIS — R739 Hyperglycemia, unspecified: Secondary | ICD-10-CM | POA: Diagnosis not present

## 2019-08-29 DIAGNOSIS — I4891 Unspecified atrial fibrillation: Secondary | ICD-10-CM | POA: Diagnosis not present

## 2019-08-29 DIAGNOSIS — Z7901 Long term (current) use of anticoagulants: Secondary | ICD-10-CM | POA: Diagnosis not present

## 2019-08-29 DIAGNOSIS — S32000A Wedge compression fracture of unspecified lumbar vertebra, initial encounter for closed fracture: Secondary | ICD-10-CM | POA: Diagnosis not present

## 2019-08-29 DIAGNOSIS — M810 Age-related osteoporosis without current pathological fracture: Secondary | ICD-10-CM | POA: Diagnosis not present

## 2019-08-29 DIAGNOSIS — I10 Essential (primary) hypertension: Secondary | ICD-10-CM | POA: Diagnosis not present

## 2019-09-06 DIAGNOSIS — I4891 Unspecified atrial fibrillation: Secondary | ICD-10-CM | POA: Diagnosis not present

## 2019-09-06 DIAGNOSIS — Z7901 Long term (current) use of anticoagulants: Secondary | ICD-10-CM | POA: Diagnosis not present

## 2019-09-20 DIAGNOSIS — Z7901 Long term (current) use of anticoagulants: Secondary | ICD-10-CM | POA: Diagnosis not present

## 2019-09-20 DIAGNOSIS — I4891 Unspecified atrial fibrillation: Secondary | ICD-10-CM | POA: Diagnosis not present

## 2019-10-04 DIAGNOSIS — H353132 Nonexudative age-related macular degeneration, bilateral, intermediate dry stage: Secondary | ICD-10-CM | POA: Diagnosis not present

## 2019-10-04 DIAGNOSIS — H34831 Tributary (branch) retinal vein occlusion, right eye, with macular edema: Secondary | ICD-10-CM | POA: Diagnosis not present

## 2019-10-04 DIAGNOSIS — H401113 Primary open-angle glaucoma, right eye, severe stage: Secondary | ICD-10-CM | POA: Diagnosis not present

## 2019-10-04 DIAGNOSIS — H34832 Tributary (branch) retinal vein occlusion, left eye, with macular edema: Secondary | ICD-10-CM | POA: Diagnosis not present

## 2019-10-04 DIAGNOSIS — H401122 Primary open-angle glaucoma, left eye, moderate stage: Secondary | ICD-10-CM | POA: Diagnosis not present

## 2019-10-15 DIAGNOSIS — H353122 Nonexudative age-related macular degeneration, left eye, intermediate dry stage: Secondary | ICD-10-CM | POA: Diagnosis not present

## 2019-10-15 DIAGNOSIS — H34832 Tributary (branch) retinal vein occlusion, left eye, with macular edema: Secondary | ICD-10-CM | POA: Diagnosis not present

## 2019-10-15 DIAGNOSIS — H353212 Exudative age-related macular degeneration, right eye, with inactive choroidal neovascularization: Secondary | ICD-10-CM | POA: Diagnosis not present

## 2019-10-15 DIAGNOSIS — H348312 Tributary (branch) retinal vein occlusion, right eye, stable: Secondary | ICD-10-CM | POA: Diagnosis not present

## 2019-10-16 DIAGNOSIS — Z7901 Long term (current) use of anticoagulants: Secondary | ICD-10-CM | POA: Diagnosis not present

## 2019-10-16 DIAGNOSIS — M810 Age-related osteoporosis without current pathological fracture: Secondary | ICD-10-CM | POA: Diagnosis not present

## 2019-10-16 DIAGNOSIS — I4891 Unspecified atrial fibrillation: Secondary | ICD-10-CM | POA: Diagnosis not present

## 2019-11-14 DIAGNOSIS — M810 Age-related osteoporosis without current pathological fracture: Secondary | ICD-10-CM | POA: Diagnosis not present

## 2019-11-14 DIAGNOSIS — Z7901 Long term (current) use of anticoagulants: Secondary | ICD-10-CM | POA: Diagnosis not present

## 2019-11-14 DIAGNOSIS — I4891 Unspecified atrial fibrillation: Secondary | ICD-10-CM | POA: Diagnosis not present

## 2019-11-15 DIAGNOSIS — H353212 Exudative age-related macular degeneration, right eye, with inactive choroidal neovascularization: Secondary | ICD-10-CM | POA: Diagnosis not present

## 2019-11-15 DIAGNOSIS — H34832 Tributary (branch) retinal vein occlusion, left eye, with macular edema: Secondary | ICD-10-CM | POA: Diagnosis not present

## 2019-11-15 DIAGNOSIS — H3563 Retinal hemorrhage, bilateral: Secondary | ICD-10-CM | POA: Diagnosis not present

## 2019-12-17 DIAGNOSIS — H43812 Vitreous degeneration, left eye: Secondary | ICD-10-CM | POA: Diagnosis not present

## 2019-12-17 DIAGNOSIS — H34832 Tributary (branch) retinal vein occlusion, left eye, with macular edema: Secondary | ICD-10-CM | POA: Diagnosis not present

## 2019-12-17 DIAGNOSIS — H35352 Cystoid macular degeneration, left eye: Secondary | ICD-10-CM | POA: Diagnosis not present

## 2019-12-27 DIAGNOSIS — R739 Hyperglycemia, unspecified: Secondary | ICD-10-CM | POA: Diagnosis not present

## 2019-12-27 DIAGNOSIS — I4891 Unspecified atrial fibrillation: Secondary | ICD-10-CM | POA: Diagnosis not present

## 2019-12-27 DIAGNOSIS — I10 Essential (primary) hypertension: Secondary | ICD-10-CM | POA: Diagnosis not present

## 2019-12-27 DIAGNOSIS — M810 Age-related osteoporosis without current pathological fracture: Secondary | ICD-10-CM | POA: Diagnosis not present

## 2020-01-18 DIAGNOSIS — I10 Essential (primary) hypertension: Secondary | ICD-10-CM | POA: Diagnosis not present

## 2020-01-18 DIAGNOSIS — M810 Age-related osteoporosis without current pathological fracture: Secondary | ICD-10-CM | POA: Diagnosis not present

## 2020-01-18 DIAGNOSIS — Z7901 Long term (current) use of anticoagulants: Secondary | ICD-10-CM | POA: Diagnosis not present

## 2020-01-18 DIAGNOSIS — I4891 Unspecified atrial fibrillation: Secondary | ICD-10-CM | POA: Diagnosis not present

## 2020-01-18 DIAGNOSIS — E7800 Pure hypercholesterolemia, unspecified: Secondary | ICD-10-CM | POA: Diagnosis not present

## 2020-01-23 ENCOUNTER — Other Ambulatory Visit: Payer: Self-pay

## 2020-01-23 ENCOUNTER — Encounter (INDEPENDENT_AMBULATORY_CARE_PROVIDER_SITE_OTHER): Payer: Self-pay | Admitting: Ophthalmology

## 2020-01-23 ENCOUNTER — Ambulatory Visit (INDEPENDENT_AMBULATORY_CARE_PROVIDER_SITE_OTHER): Payer: Medicare Other | Admitting: Ophthalmology

## 2020-01-23 DIAGNOSIS — H353212 Exudative age-related macular degeneration, right eye, with inactive choroidal neovascularization: Secondary | ICD-10-CM

## 2020-01-23 DIAGNOSIS — H401132 Primary open-angle glaucoma, bilateral, moderate stage: Secondary | ICD-10-CM | POA: Diagnosis not present

## 2020-01-23 DIAGNOSIS — H348312 Tributary (branch) retinal vein occlusion, right eye, stable: Secondary | ICD-10-CM | POA: Diagnosis not present

## 2020-01-23 DIAGNOSIS — H34832 Tributary (branch) retinal vein occlusion, left eye, with macular edema: Secondary | ICD-10-CM

## 2020-01-23 DIAGNOSIS — H353122 Nonexudative age-related macular degeneration, left eye, intermediate dry stage: Secondary | ICD-10-CM | POA: Diagnosis not present

## 2020-01-23 MED ORDER — BEVACIZUMAB CHEMO INJECTION 1.25MG/0.05ML SYRINGE FOR KALEIDOSCOPE
1.2500 mg | INTRAVITREAL | Status: AC | PRN
  Administered 2020-01-23: 1.25 mg via INTRAVITREAL

## 2020-01-23 NOTE — Assessment & Plan Note (Signed)
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  The patient was given access to printed information.  The treatment options including continued observation looking for spontaneous resolution versus grid laser versus intravitreal Kenalog injection were discussed.  PRIMARY THERAPY CONSISTS of Anti-VEGF Therapies, AVASTIN, LUCENTIS AND EYLEA.  Their usage was discussed to assist in halting the progression of Macular Edema, in order to preserve, protect or improve acuity.  Additionally, at times, limited focal laser therapy is used in the management.  The risks and benefits of all these options were discussed with the patient.  The patient's questions were answered. OS improved at 5-week interval after her last Avastin, thus active disease remains, will repeat Avastin today

## 2020-01-23 NOTE — Patient Instructions (Signed)
Central Retinal Vein Occlusion, Adult  Central retinal vein occlusion (CRVO) is a blockage in the main blood vessel that carries blood away from the retina (central retinal vein). The retina is the part of your eye that senses light and sends signals to the brain that allow you to see. CRVO can cause blurry vision. It can also cause complete or partial vision loss. The condition usually affects only one eye. What are the causes? This condition is usually caused by a blood clot that forms inside the central retinal vein. A clot is blood that has thickened into a gel or solid. Vision loss happens because the blockage in the vein causes fluid to leak out of the vein and collect in the retina. Sometimes the cause is not known. What increases the risk? This condition is more likely to develop in:  People who have a blood vessel disease that causes narrowing of the arteries (atherosclerosis). This is the main risk factor for this condition.  People who are age 50 or older.  People who have any of the following medical conditions: ? High blood pressure (hypertension). ? Diabetes. ? Heart disease. ? High levels of fat in the blood (hyperlipidemia). ? Increased pressure inside the eye (glaucoma). ? Disorders that increase blood clotting (coagulopathy). What are the signs or symptoms? Symptoms of this condition include:  Sudden and painless vision loss. If the vision loss is partial, it may get worse over a span of hours or days.  Sudden and painless blurry vision.  Tiny spots or clumps that move across your vision (floaters).  Pain or pressure in your eye (uncommon). How is this diagnosed? This condition is usually diagnosed by a health care provider who specializes in eye diseases (ophthalmologist). To diagnose the condition, the ophthalmologist will do an eye exam. She or he may also:  Do an exam that involves having eye drops put in the eye (slit lamp exam or dilated fundus exam).  Order  tests that help diagnose the condition, such as: ? A vision test. ? A measurement of the pressure inside your eye. ? A fluorescein angiogram. In this test, pictures are taken of your retina after a dye is injected into your bloodstream. ? Optical coherence tomography. In this test, light waves are used to create pictures of your retina.  Check your blood pressure.  Order tests to find the cause of your condition and rule out other conditions. The tests may check for these problems: ? Diabetes. ? High levels of fat or cholesterol in your blood. ? Abnormal blood clotting. How is this treated? There is no cure for this condition. The goals of treatment are to save as much of your vision as possible and to prevent the condition from happening again. Treatment may include:  Medicine to reduce swelling in your eye. The medicine may be injected in your eye, or an implant may be placed in your eye to release the medicine.  Laser treatment to reduce swelling and stop fluid leakage in the eye (focal laser treatment).  Laser treatment to seal or destroy abnormal blood vessels (pan-retinal laser treatment). Follow these instructions at home:  Use over-the-counter and prescription medicines only as told by your health care provider.  Keep all follow-up visits as told by your health care provider. This is important. How is this prevented? To help prevent this condition:  Work with your health care providers to reduce your risk factors.  Do not use any products that contain nicotine or tobacco, such   as cigarettes and e-cigarettes. If you need help quitting, ask your health care provider.  Follow a heart-healthy diet. This diet includes a lot of fruits, vegetables, grains, and lean protein. It is low in saturated fat and sugar.  Maintain a healthy weight.  Exercise regularly. Contact a health care provider if:  You have any changes in your vision. Get help right away if:  You have eye  pain.  You have symptoms of this condition, including blurry vision or floaters.  You have new or sudden vision loss. Summary  Central retinal vein occlusion (CRVO) is a blockage in the main blood vessel that carries blood away from the retina (central retinal vein). The retina is the part of your eye that senses light and sends signals to the brain that allow you to see.  CRVO can cause blurry vision. It can also cause complete or partial vision loss. The condition usually affects only one eye.  There is no cure for this condition. The goals of treatment are to save as much of your vision as possible and to prevent the condition from happening again.  Get help right away if you have new or sudden vision loss. This information is not intended to replace advice given to you by your health care provider. Make sure you discuss any questions you have with your health care provider. Document Revised: 09/02/2017 Document Reviewed: 11/26/2016 Elsevier Patient Education  2020 [employer]  

## 2020-01-23 NOTE — Progress Notes (Signed)
01/23/2020     CHIEF COMPLAINT Patient presents for Retina Follow Up   HISTORY OF PRESENT ILLNESS: Kelli Peterson is a 86 y.o. female who presents to the clinic today for:   HPI    Retina Follow Up    Patient presents with  CRVO/BRVO.  In left eye.  Duration of 5 weeks.  Since onset it is stable.          Comments    5 week follow up - OCT OU, Possible Avastin OS Patient denies change in vision and overall has no complaints.        Last edited by Gerda Diss on 01/23/2020  9:59 AM. (History)      Referring physician: Jani Gravel, MD [address],  [place] [address]  HISTORICAL INFORMATION:   Selected notes from the MEDICAL RECORD NUMBER       CURRENT MEDICATIONS: Current Outpatient Medications (Ophthalmic Drugs)  Medication Sig  . brimonidine (ALPHAGAN) 0.2 % ophthalmic solution Place 1 drop into the right eye 2 (two) times daily.  . dorzolamide-timolol (COSOPT) 22.3-6.8 MG/ML ophthalmic solution Place 1 drop into both eyes 2 (two) times daily.  Marland Kitchen latanoprost (XALATAN) 0.005 % ophthalmic solution Place 1 drop into both eyes at bedtime.   Mckinley Jewel Dimesylate (RHOPRESSA) 0.02 % SOLN Apply 1 drop to eye daily. OD  . dorzolamide (TRUSOPT) 2 % ophthalmic solution Place 1 drop into both eyes 2 (two) times daily.    No current facility-administered medications for this visit. (Ophthalmic Drugs)   Current Outpatient Medications (Other)  Medication Sig  . acetaminophen (TYLENOL) 500 MG tablet Take 500 mg by mouth every 6 (six) hours as needed for pain.  Marland Kitchen alendronate (FOSAMAX) 70 MG tablet Take 70 mg by mouth every Thursday. Take with a full glass of water on an empty stomach.  Marland Kitchen apixaban (ELIQUIS) 2.5 MG TABS tablet Take 2.5 mg by mouth 2 (two) times daily.  Marland Kitchen atorvastatin (LIPITOR) 20 MG tablet Take 20 mg by mouth daily.  Marland Kitchen diltiazem (TIAZAC) 180 MG 24 hr capsule Take 1 capsule (180 mg total) by mouth daily.  . ferrous sulfate 325 (65 FE) MG  tablet Take 325 mg by mouth daily with breakfast.  . hydrALAZINE (APRESOLINE) 25 MG tablet Take 1 tablet (25 mg total) by mouth 2 (two) times daily.  Marland Kitchen HYDROcodone-acetaminophen (NORCO/VICODIN) 5-325 MG tablet Take 2 tablets by mouth every 6 (six) hours as needed.  . Multiple Vitamin (MULTIVITAMIN WITH MINERALS) TABS tablet Take 1 tablet by mouth daily.  Marland Kitchen oxybutynin (DITROPAN) 5 MG tablet Take 5 mg by mouth 2 (two) times daily as needed for bladder spasms.   . metoprolol succinate (TOPROL-XL) 25 MG 24 hr tablet Take 1 tablet (25 mg total) by mouth daily. (Patient not taking: Reported on 01/23/2020)  . potassium chloride SA (K-DUR,KLOR-CON) 20 MEQ tablet Take 1 tablet (20 mEq total) by mouth daily for 14 days.  Marland Kitchen warfarin (COUMADIN) 2 MG tablet Take 2-3 mg by mouth See admin instructions. Take 1 tablet (2mg ) every Sunday, Monday, Wednesday, and Friday. Then take 1 1/2 tablets (3mg ) every Tuesday Thursday and Saturday.   No current facility-administered medications for this visit. (Other)      REVIEW OF SYSTEMS:    ALLERGIES Allergies  Allergen Reactions  . Penicillins Swelling  . Zocor [Simvastatin] Other (See Comments)    Weak and dizzy    PAST MEDICAL HISTORY Past Medical History:  Diagnosis Date  . Adenomatous colon  polyp 2007  . Atrial fibrillation ([place])   . Barrett's esophagus 2007  . Cerebellar hemorrhage ([place])   . Cervical cancer ([place])   . CHF (congestive heart failure) ([place])   . Closed pelvic fracture ([place])   . CVD (cardiovascular disease)   . DVT (deep venous thrombosis) ([place])   . Fibrocystic breast disease   . Fracture of pubic ramus ([place])   . Hyperlipidemia   . Hypertension   . Iron deficiency anemia    Past Surgical History:  Procedure Laterality Date  . CATARACT EXTRACTION Right 02/15/2010   [facility]  . CATARACT EXTRACTION Left 01/30/2010   [facility]  . CATARACT EXTRACTION, BILATERAL  2010  . TOTAL ABDOMINAL HYSTERECTOMY  1964    FAMILY HISTORY Family  History  Problem Relation Age of Onset  . CVA Father   . CVA Mother     SOCIAL HISTORY Social History   Tobacco Use  . Smoking status: Never Smoker  . Smokeless tobacco: Never Used  Substance Use Topics  . Alcohol use: No  . Drug use: No         OPHTHALMIC EXAM: Base Eye Exam    Visual Acuity (Snellen - Linear)      Right Left   Dist [place] 20/100 20/100-2   Dist ph [place] 20/50+1 20/80   Correction: Glasses       Tonometry (Tonopen, 10:14 AM)      Right Left   Pressure 9 13       Pupils      Pupils Dark Light Shape React APD   Right PERRL 4 3 Round Brisk None   Left PERRL 4 3 Round Slow None       Visual Fields (Counting fingers)      Left Right    Full Full       Extraocular Movement      Right Left    Full Full       Neuro/Psych    Oriented x3: Yes   Mood/Affect: Normal       Dilation    Left eye: 1.0% Mydriacyl, 2.5% Phenylephrine @ 10:14 AM        Slit Lamp and Fundus Exam    External Exam      Right Left   External Normal Normal       Slit Lamp Exam      Right Left   Lids/Lashes Normal Normal   Conjunctiva/Sclera White and quiet White and quiet   Cornea Clear Clear   Anterior Chamber Deep and quiet Deep and quiet   Iris Round and reactive Round and reactive   Lens Posterior chamber intraocular lens Posterior chamber intraocular lens   Vitreous Normal Normal          IMAGING AND PROCEDURES  Imaging and Procedures for 01/23/20           ASSESSMENT/PLAN:  No problem-specific Assessment & Plan notes found for this encounter.      ICD-10-CM   1. Branch retinal vein occlusion with macular edema of left eye  H34.8320   2. Branch retinal vein occlusion of right eye, unspecified complication status  0000000   3. Exudative age-related macular degeneration of right eye with inactive choroidal neovascularization ([place])  H35.3212   4. Intermediate stage nonexudative age-related macular degeneration of left eye  H35.3122      1.  2.  3.  Ophthalmic Meds Ordered this visit:  No orders of the defined types were placed in this encounter.  No follow-ups on file.  There are no Patient Instructions on file for this visit.   Explained the diagnoses, plan, and follow up with the patient and they expressed understanding.  Patient expressed understanding of the importance of proper follow up care.   Clent Demark Kaleigh Spiegelman M.D. Diseases & Surgery of the Retina and Vitreous Retina & Diabetic [place] 01/23/20     Abbreviations: M myopia (nearsighted); A astigmatism; H hyperopia (farsighted); P presbyopia; Mrx spectacle prescription;  CTL contact lenses; OD right eye; OS left eye; OU both eyes  XT exotropia; ET esotropia; PEK punctate epithelial keratitis; PEE punctate epithelial erosions; DES dry eye syndrome; MGD meibomian gland dysfunction; ATs artificial tears; PFAT's preservative free artificial tears; [place] nuclear sclerotic cataract; PSC posterior subcapsular cataract; ERM epi-retinal membrane; PVD posterior vitreous detachment; RD retinal detachment; DM diabetes mellitus; DR diabetic retinopathy; NPDR non-proliferative diabetic retinopathy; PDR proliferative diabetic retinopathy; CSME clinically significant macular edema; DME diabetic macular edema; dbh dot blot hemorrhages; CWS cotton wool spot; POAG primary open angle glaucoma; C/D cup-to-disc ratio; HVF humphrey visual field; GVF goldmann visual field; OCT optical coherence tomography; IOP intraocular pressure; BRVO Branch retinal vein occlusion; CRVO central retinal vein occlusion; CRAO central retinal artery occlusion; BRAO branch retinal artery occlusion; RT retinal tear; SB scleral buckle; PPV pars plana vitrectomy; VH Vitreous hemorrhage; PRP panretinal laser photocoagulation; IVK intravitreal kenalog; VMT vitreomacular traction; MH Macular hole;  NVD neovascularization of the disc; NVE neovascularization elsewhere; AREDS age related eye disease study;  ARMD age related macular degeneration; POAG primary open angle glaucoma; EBMD epithelial/anterior basement membrane dystrophy; ACIOL anterior chamber intraocular lens; IOL intraocular lens; PCIOL posterior chamber intraocular lens; Phaco/IOL phacoemulsification with intraocular lens placement; [place] photorefractive keratectomy; LASIK laser assisted in situ keratomileusis; HTN hypertension; DM diabetes mellitus; COPD chronic obstructive pulmonary disease

## 2020-02-28 ENCOUNTER — Other Ambulatory Visit: Payer: Self-pay

## 2020-02-28 ENCOUNTER — Encounter (INDEPENDENT_AMBULATORY_CARE_PROVIDER_SITE_OTHER): Payer: Self-pay | Admitting: Ophthalmology

## 2020-02-28 ENCOUNTER — Ambulatory Visit (INDEPENDENT_AMBULATORY_CARE_PROVIDER_SITE_OTHER): Payer: Medicare Other | Admitting: Ophthalmology

## 2020-02-28 DIAGNOSIS — H34832 Tributary (branch) retinal vein occlusion, left eye, with macular edema: Secondary | ICD-10-CM | POA: Diagnosis not present

## 2020-02-28 MED ORDER — BEVACIZUMAB CHEMO INJECTION 1.25MG/0.05ML SYRINGE FOR KALEIDOSCOPE
1.2500 mg | INTRAVITREAL | Status: AC | PRN
  Administered 2020-02-28: 1.25 mg via INTRAVITREAL

## 2020-02-28 NOTE — Assessment & Plan Note (Signed)
As, at 5-week interval intravitreal Avastin for branch retinal vein occlusion with widespread intraretinal hemorrhage and macular edema.  Overall improved yet still on clinical examination.  Repeat intravitreal Avastin OS today

## 2020-02-28 NOTE — Progress Notes (Signed)
02/28/2020     CHIEF COMPLAINT Patient presents for Retina Follow Up   HISTORY OF PRESENT ILLNESS: Kelli Peterson is a 86 y.o. female who presents to the clinic today for:   HPI    Retina Follow Up    Patient presents with  CRVO/BRVO.  In left eye.  Duration of 5 weeks.  Since onset it is stable.          Comments    5 week follow up - OCT OU, Possible Avastin OS .Patient denies change in vision and overall has no complaints.         Last edited by Gerda Diss on 02/28/2020  9:49 AM. (History)      Referring physician: Jani Gravel, MD [address] [place],  [place] [address]  HISTORICAL INFORMATION:   Selected notes from the MEDICAL RECORD NUMBER       CURRENT MEDICATIONS: Current Outpatient Medications (Ophthalmic Drugs)  Medication Sig  . brimonidine (ALPHAGAN) 0.2 % ophthalmic solution Place 1 drop into the right eye 2 (two) times daily.  . dorzolamide (TRUSOPT) 2 % ophthalmic solution Place 1 drop into both eyes 2 (two) times daily.   . dorzolamide-timolol (COSOPT) 22.3-6.8 MG/ML ophthalmic solution Place 1 drop into both eyes 2 (two) times daily.  Marland Kitchen latanoprost (XALATAN) 0.005 % ophthalmic solution Place 1 drop into both eyes at bedtime.   Mckinley Jewel Dimesylate (RHOPRESSA) 0.02 % SOLN Apply 1 drop to eye daily. OD   No current facility-administered medications for this visit. (Ophthalmic Drugs)   Current Outpatient Medications (Other)  Medication Sig  . acetaminophen (TYLENOL) 500 MG tablet Take 500 mg by mouth every 6 (six) hours as needed for pain.  Marland Kitchen alendronate (FOSAMAX) 70 MG tablet Take 70 mg by mouth every Thursday. Take with a full glass of water on an empty stomach.  Marland Kitchen apixaban (ELIQUIS) 2.5 MG TABS tablet Take 2.5 mg by mouth 2 (two) times daily.  Marland Kitchen atorvastatin (LIPITOR) 20 MG tablet Take 20 mg by mouth daily.  Marland Kitchen diltiazem (TIAZAC) 180 MG 24 hr capsule Take 1 capsule (180 mg total) by mouth daily.  . ferrous sulfate 325 (65 FE)  MG tablet Take 325 mg by mouth daily with breakfast.  . hydrALAZINE (APRESOLINE) 25 MG tablet Take 1 tablet (25 mg total) by mouth 2 (two) times daily.  Marland Kitchen HYDROcodone-acetaminophen (NORCO/VICODIN) 5-325 MG tablet Take 2 tablets by mouth every 6 (six) hours as needed.  . metoprolol succinate (TOPROL-XL) 25 MG 24 hr tablet Take 1 tablet (25 mg total) by mouth daily. (Patient not taking: Reported on 01/23/2020)  . Multiple Vitamin (MULTIVITAMIN WITH MINERALS) TABS tablet Take 1 tablet by mouth daily.  Marland Kitchen oxybutynin (DITROPAN) 5 MG tablet Take 5 mg by mouth 2 (two) times daily as needed for bladder spasms.   . potassium chloride SA (K-DUR,KLOR-CON) 20 MEQ tablet Take 1 tablet (20 mEq total) by mouth daily for 14 days.  Marland Kitchen warfarin (COUMADIN) 2 MG tablet Take 2-3 mg by mouth See admin instructions. Take 1 tablet (2mg ) every Sunday, Monday, Wednesday, and Friday. Then take 1 1/2 tablets (3mg ) every Tuesday Thursday and Saturday.   No current facility-administered medications for this visit. (Other)      REVIEW OF SYSTEMS:    ALLERGIES Allergies  Allergen Reactions  . Penicillins Swelling  . Zocor [Simvastatin] Other (See Comments)    Weak and dizzy    PAST MEDICAL HISTORY Past Medical History:  Diagnosis Date  . Adenomatous  colon polyp 2007  . Atrial fibrillation ([place])   . Barrett's esophagus 2007  . Cerebellar hemorrhage ([place])   . Cervical cancer ([place])   . CHF (congestive heart failure) ([place])   . Closed pelvic fracture ([place])   . CVD (cardiovascular disease)   . DVT (deep venous thrombosis) ([place])   . Fibrocystic breast disease   . Fracture of pubic ramus ([place])   . Hyperlipidemia   . Hypertension   . Iron deficiency anemia    Past Surgical History:  Procedure Laterality Date  . CATARACT EXTRACTION Right 02/15/2010   [facility]  . CATARACT EXTRACTION Left 01/30/2010   [facility]  . CATARACT EXTRACTION, BILATERAL  2010  . TOTAL ABDOMINAL HYSTERECTOMY  1964    FAMILY HISTORY Family  History  Problem Relation Age of Onset  . CVA Father   . CVA Mother     SOCIAL HISTORY Social History   Tobacco Use  . Smoking status: Never Smoker  . Smokeless tobacco: Never Used  Substance Use Topics  . Alcohol use: No  . Drug use: No         OPHTHALMIC EXAM:  Base Eye Exam    Visual Acuity (Snellen - Linear)      Right Left   Dist [place] 20/50 20/100-2   Dist ph [place] NI 20/70+1       Tonometry (Tonopen, 9:55 AM)      Right Left   Pressure 11 12       Pupils      Pupils Dark Light Shape React APD   Right PERRL 4 3 Round Brisk None   Left PERRL 4 3 Round Slow None       Visual Fields (Counting fingers)      Left Right    Full Full       Extraocular Movement      Right Left    Full Full       Neuro/Psych    Oriented x3: Yes   Mood/Affect: Normal       Dilation    Left eye: 1.0% Mydriacyl, 2.5% Phenylephrine @ 9:55 AM        Slit Lamp and Fundus Exam    External Exam      Right Left   External Normal Normal       Slit Lamp Exam      Right Left   Lids/Lashes Normal Normal   Conjunctiva/Sclera White and quiet White and quiet   Cornea Clear Clear   Anterior Chamber Deep and quiet Deep and quiet   Iris Round and reactive Round and reactive   Lens Posterior chamber intraocular lens Posterior chamber intraocular lens   Anterior Vitreous Normal Normal       Fundus Exam      Right Left   Posterior Vitreous  Posterior vitreous detachment   Disc  1+ Pallor,, with collaterals on the superior pole of the nerve   C/D Ratio  0.8   Macula  Intraretinal hemorrhage, Cystoid macular edema, Macular thickening   Vessels  Superior hemicentral retinal vein occlusion left eye, still active   Periphery  Normal          IMAGING AND PROCEDURES  Imaging and Procedures for 02/28/20  OCT, Retina - OU - Both Eyes       Right Eye Quality was good. Scan locations included subfoveal. Central Foveal Thickness: 205.   Left Eye Quality was borderline. Scan  locations included subfoveal. Progression has improved. Findings include cystoid macular  edema.   Notes OS, less CME in the facets as well as temporal to the macula.  Repeat intravitreal Avastin OS today for large branch retinal vein occlusion                ASSESSMENT/PLAN:  Branch retinal vein occlusion with macular edema of left eye As, at 5-week interval intravitreal Avastin for branch retinal vein occlusion with widespread intraretinal hemorrhage and macular edema.  Overall improved yet still on clinical examination.  Repeat intravitreal Avastin OS today      ICD-10-CM   1. Branch retinal vein occlusion with macular edema of left eye  H34.8320 OCT, Retina - OU - Both Eyes    1.Repeat intravitreal Avastin OS today  2.  3.  Ophthalmic Meds Ordered this visit:  No orders of the defined types were placed in this encounter.      Return in about 5 weeks (around 04/03/2020) for dilate, OS, AVASTIN OCT.  There are no Patient Instructions on file for this visit.   Explained the diagnoses, plan, and follow up with the patient and they expressed understanding.  Patient expressed understanding of the importance of proper follow up care.   Clent Demark Deborahann Poteat M.D. Diseases & Surgery of the Retina and Vitreous Retina & Diabetic [place] 02/28/20     Abbreviations: M myopia (nearsighted); A astigmatism; H hyperopia (farsighted); P presbyopia; Mrx spectacle prescription;  CTL contact lenses; OD right eye; OS left eye; OU both eyes  XT exotropia; ET esotropia; PEK punctate epithelial keratitis; PEE punctate epithelial erosions; DES dry eye syndrome; MGD meibomian gland dysfunction; ATs artificial tears; PFAT's preservative free artificial tears; [place] nuclear sclerotic cataract; PSC posterior subcapsular cataract; ERM epi-retinal membrane; PVD posterior vitreous detachment; RD retinal detachment; DM diabetes mellitus; DR diabetic retinopathy; NPDR non-proliferative diabetic  retinopathy; PDR proliferative diabetic retinopathy; CSME clinically significant macular edema; DME diabetic macular edema; dbh dot blot hemorrhages; CWS cotton wool spot; POAG primary open angle glaucoma; C/D cup-to-disc ratio; HVF humphrey visual field; GVF goldmann visual field; OCT optical coherence tomography; IOP intraocular pressure; BRVO Branch retinal vein occlusion; CRVO central retinal vein occlusion; CRAO central retinal artery occlusion; BRAO branch retinal artery occlusion; RT retinal tear; SB scleral buckle; PPV pars plana vitrectomy; VH Vitreous hemorrhage; PRP panretinal laser photocoagulation; IVK intravitreal kenalog; VMT vitreomacular traction; MH Macular hole;  NVD neovascularization of the disc; NVE neovascularization elsewhere; AREDS age related eye disease study; ARMD age related macular degeneration; POAG primary open angle glaucoma; EBMD epithelial/anterior basement membrane dystrophy; ACIOL anterior chamber intraocular lens; IOL intraocular lens; PCIOL posterior chamber intraocular lens; Phaco/IOL phacoemulsification with intraocular lens placement; [place] photorefractive keratectomy; LASIK laser assisted in situ keratomileusis; HTN hypertension; DM diabetes mellitus; COPD chronic obstructive pulmonary disease

## 2020-03-04 ENCOUNTER — Telehealth: Payer: Self-pay

## 2020-03-04 DIAGNOSIS — K869 Disease of pancreas, unspecified: Secondary | ICD-10-CM

## 2020-03-04 NOTE — Telephone Encounter (Signed)
Kelli Peterson I scheduled the pt for repeat MRI for pancreatic lesions and she called back to cancel and does not wish to have any testing at this time.

## 2020-03-04 NOTE — Telephone Encounter (Signed)
-----   Message from Timothy Lasso, RN sent at 03/01/2018  9:55 AM EDT -----  "lesions" in pancreas are most consistent with benign appearing cysts. Recommendations are for repeat MRI in 2 yrs to ensure these remain stable

## 2020-03-04 NOTE — Telephone Encounter (Signed)
Pt's Daughter has called and stated her mother is not interested in appointment and wants to  cancel appt @ [facility] 03-12-20.

## 2020-03-04 NOTE — Telephone Encounter (Signed)
You have been scheduled for an MRI at [facility] on 03/12/20. Your appointment time is 3 pm. Please arrive 30 minutes prior to your appointment time for registration purposes. Please make certain not to have anything to eat or drink 4 hours prior to your test. In addition, if you have any metal in your body, have a pacemaker or defibrillator, please be sure to let your ordering physician know. This test typically takes 45 minutes to 1 hour to complete. Should you need to reschedule, please call [phone] to do so.   Left message on machine to call back

## 2020-03-12 ENCOUNTER — Ambulatory Visit (HOSPITAL_COMMUNITY): Payer: Medicare Other

## 2020-03-31 DIAGNOSIS — I517 Cardiomegaly: Secondary | ICD-10-CM | POA: Diagnosis not present

## 2020-03-31 DIAGNOSIS — R000 Tachycardia, unspecified: Secondary | ICD-10-CM | POA: Diagnosis not present

## 2020-03-31 DIAGNOSIS — Z7901 Long term (current) use of anticoagulants: Secondary | ICD-10-CM | POA: Diagnosis not present

## 2020-03-31 DIAGNOSIS — I4891 Unspecified atrial fibrillation: Secondary | ICD-10-CM | POA: Diagnosis not present

## 2020-03-31 DIAGNOSIS — R5383 Other fatigue: Secondary | ICD-10-CM | POA: Diagnosis not present

## 2020-03-31 DIAGNOSIS — I10 Essential (primary) hypertension: Secondary | ICD-10-CM | POA: Diagnosis not present

## 2020-04-03 ENCOUNTER — Encounter (INDEPENDENT_AMBULATORY_CARE_PROVIDER_SITE_OTHER): Payer: Medicare Other | Admitting: Ophthalmology

## 2020-04-03 ENCOUNTER — Encounter (INDEPENDENT_AMBULATORY_CARE_PROVIDER_SITE_OTHER): Payer: Self-pay

## 2020-04-08 ENCOUNTER — Ambulatory Visit (INDEPENDENT_AMBULATORY_CARE_PROVIDER_SITE_OTHER): Payer: Medicare Other | Admitting: Ophthalmology

## 2020-04-08 ENCOUNTER — Encounter (INDEPENDENT_AMBULATORY_CARE_PROVIDER_SITE_OTHER): Payer: Self-pay | Admitting: Ophthalmology

## 2020-04-08 ENCOUNTER — Other Ambulatory Visit: Payer: Self-pay

## 2020-04-08 DIAGNOSIS — H34832 Tributary (branch) retinal vein occlusion, left eye, with macular edema: Secondary | ICD-10-CM | POA: Diagnosis not present

## 2020-04-08 MED ORDER — BEVACIZUMAB CHEMO INJECTION 1.25MG/0.05ML SYRINGE FOR KALEIDOSCOPE
1.2500 mg | INTRAVITREAL | Status: AC | PRN
  Administered 2020-04-08: 1.25 mg via INTRAVITREAL

## 2020-04-08 NOTE — Progress Notes (Signed)
04/08/2020     CHIEF COMPLAINT Patient presents for Retina Follow Up   HISTORY OF PRESENT ILLNESS: Kelli Peterson is a 86 y.o. female who presents to the clinic today for:   HPI    Retina Follow Up    Patient presents with  CRVO/BRVO.  In left eye.  This started 6 weeks ago.  Severity is moderate.  Duration of 6 weeks.  Since onset it is stable.          Comments    6 Week BRVO F/U OS, poss Avastin OS  Pt denies noticeable changes to [place] OU since last visit. Pt denies ocular pain, flashes of light, or floaters OU.         Last edited by Rockie Neighbours, [place] on 04/08/2020  8:40 AM. (History)      Referring physician: Jani Gravel, MD [address] [place],  [place] [address]  HISTORICAL INFORMATION:   Selected notes from the MEDICAL RECORD NUMBER       CURRENT MEDICATIONS: Current Outpatient Medications (Ophthalmic Drugs)  Medication Sig  . brimonidine (ALPHAGAN) 0.2 % ophthalmic solution Place 1 drop into the right eye 2 (two) times daily.  . dorzolamide (TRUSOPT) 2 % ophthalmic solution Place 1 drop into both eyes 2 (two) times daily.   . dorzolamide-timolol (COSOPT) 22.3-6.8 MG/ML ophthalmic solution Place 1 drop into both eyes 2 (two) times daily.  Marland Kitchen latanoprost (XALATAN) 0.005 % ophthalmic solution Place 1 drop into both eyes at bedtime.   Mckinley Jewel Dimesylate (RHOPRESSA) 0.02 % SOLN Apply 1 drop to eye daily. OD   No current facility-administered medications for this visit. (Ophthalmic Drugs)   Current Outpatient Medications (Other)  Medication Sig  . acetaminophen (TYLENOL) 500 MG tablet Take 500 mg by mouth every 6 (six) hours as needed for pain.  Marland Kitchen alendronate (FOSAMAX) 70 MG tablet Take 70 mg by mouth every Thursday. Take with a full glass of water on an empty stomach.  Marland Kitchen apixaban (ELIQUIS) 2.5 MG TABS tablet Take 2.5 mg by mouth 2 (two) times daily.  Marland Kitchen atorvastatin (LIPITOR) 20 MG tablet Take 20 mg by mouth daily.  Marland Kitchen diltiazem (TIAZAC) 180  MG 24 hr capsule Take 1 capsule (180 mg total) by mouth daily.  . ferrous sulfate 325 (65 FE) MG tablet Take 325 mg by mouth daily with breakfast.  . hydrALAZINE (APRESOLINE) 25 MG tablet Take 1 tablet (25 mg total) by mouth 2 (two) times daily.  Marland Kitchen HYDROcodone-acetaminophen (NORCO/VICODIN) 5-325 MG tablet Take 2 tablets by mouth every 6 (six) hours as needed.  . metoprolol succinate (TOPROL-XL) 25 MG 24 hr tablet Take 1 tablet (25 mg total) by mouth daily. (Patient not taking: Reported on 01/23/2020)  . Multiple Vitamin (MULTIVITAMIN WITH MINERALS) TABS tablet Take 1 tablet by mouth daily.  Marland Kitchen oxybutynin (DITROPAN) 5 MG tablet Take 5 mg by mouth 2 (two) times daily as needed for bladder spasms.   . potassium chloride SA (K-DUR,KLOR-CON) 20 MEQ tablet Take 1 tablet (20 mEq total) by mouth daily for 14 days.  Marland Kitchen warfarin (COUMADIN) 2 MG tablet Take 2-3 mg by mouth See admin instructions. Take 1 tablet (2mg ) every Sunday, Monday, Wednesday, and Friday. Then take 1 1/2 tablets (3mg ) every Tuesday Thursday and Saturday.   No current facility-administered medications for this visit. (Other)      REVIEW OF SYSTEMS:    ALLERGIES Allergies  Allergen Reactions  . Penicillins Swelling  . Zocor [Simvastatin] Other (See Comments)  Weak and dizzy    PAST MEDICAL HISTORY Past Medical History:  Diagnosis Date  . Adenomatous colon polyp 2007  . Atrial fibrillation ([place])   . Barrett's esophagus 2007  . Cerebellar hemorrhage ([place])   . Cervical cancer ([place])   . CHF (congestive heart failure) ([place])   . Closed pelvic fracture ([place])   . CVD (cardiovascular disease)   . DVT (deep venous thrombosis) ([place])   . Fibrocystic breast disease   . Fracture of pubic ramus ([place])   . Hyperlipidemia   . Hypertension   . Iron deficiency anemia    Past Surgical History:  Procedure Laterality Date  . CATARACT EXTRACTION Right 02/15/2010   [facility]  . CATARACT EXTRACTION Left 01/30/2010   [facility]  . CATARACT  EXTRACTION, BILATERAL  2010  . TOTAL ABDOMINAL HYSTERECTOMY  1964    FAMILY HISTORY Family History  Problem Relation Age of Onset  . CVA Father   . CVA Mother     SOCIAL HISTORY Social History   Tobacco Use  . Smoking status: Never Smoker  . Smokeless tobacco: Never Used  Substance Use Topics  . Alcohol use: No  . Drug use: No         OPHTHALMIC EXAM: Base Eye Exam    Visual Acuity (ETDRS)      Right Left   Dist [place] 20/70 20/100ecc   Dist ph [place] 20/60 20/70ecc       Tonometry (Tonopen, 8:47 AM)      Right Left   Pressure 11 16       Pupils      Pupils Dark Light Shape React APD   Right PERRL 4 3 Round Brisk None   Left PERRL 4 3 Round Brisk None       Visual Fields (Counting fingers)      Left Right    Full Full       Extraocular Movement      Right Left    Full Full       Neuro/Psych    Oriented x3: Yes   Mood/Affect: Normal       Dilation    Left eye: 1.0% Mydriacyl, 2.5% Phenylephrine @ 8:47 AM        Slit Lamp and Fundus Exam    External Exam      Right Left   External Normal Normal       Slit Lamp Exam      Right Left   Lids/Lashes Normal Normal   Conjunctiva/Sclera White and quiet White and quiet   Cornea Clear Clear   Anterior Chamber Deep and quiet Deep and quiet   Iris Round and reactive Round and reactive   Lens Posterior chamber intraocular lens Posterior chamber intraocular lens   Anterior Vitreous Normal Normal       Fundus Exam      Right Left   Posterior Vitreous  Posterior vitreous detachment   Disc  1+ Pallor,, with collaterals on the superior pole of the nerve   C/D Ratio  0.8   Macula  Intraretinal hemorrhage, Cystoid macular edema, Macular thickening   Vessels  Superior hemicentral retinal vein occlusion left eye, still active   Periphery  Normal          IMAGING AND PROCEDURES  Imaging and Procedures for 04/08/20  OCT, Retina - OU - Both Eyes       Right Eye Quality was borderline. Scan locations  included subfoveal. Progression has been stable.   Left  Eye Quality was poor. Progression has been stable.   Notes Overall no CME residual in the left eye at this time at 6-week interval on intravitreal Avastin for \ retinal vein occlusion                ASSESSMENT/PLAN:  No problem-specific Assessment & Plan notes found for this encounter.      ICD-10-CM   1. Branch retinal vein occlusion with macular edema of left eye  H34.8320 OCT, Retina - OU - Both Eyes    1.  Repeat intravitreal Avastin OS today  2.  3.  Ophthalmic Meds Ordered this visit:  No orders of the defined types were placed in this encounter.      Return in about 8 weeks (around 06/03/2020) for dilate, OS, AVASTIN OCT.  There are no Patient Instructions on file for this visit.   Explained the diagnoses, plan, and follow up with the patient and they expressed understanding.  Patient expressed understanding of the importance of proper follow up care.   Clent Demark Brallan Denio M.D. Diseases & Surgery of the Retina and Vitreous Retina & Diabetic [place] 04/08/20     Abbreviations: M myopia (nearsighted); A astigmatism; H hyperopia (farsighted); P presbyopia; Mrx spectacle prescription;  CTL contact lenses; OD right eye; OS left eye; OU both eyes  XT exotropia; ET esotropia; PEK punctate epithelial keratitis; PEE punctate epithelial erosions; DES dry eye syndrome; MGD meibomian gland dysfunction; ATs artificial tears; PFAT's preservative free artificial tears; [place] nuclear sclerotic cataract; PSC posterior subcapsular cataract; ERM epi-retinal membrane; PVD posterior vitreous detachment; RD retinal detachment; DM diabetes mellitus; DR diabetic retinopathy; NPDR non-proliferative diabetic retinopathy; PDR proliferative diabetic retinopathy; CSME clinically significant macular edema; DME diabetic macular edema; dbh dot blot hemorrhages; CWS cotton wool spot; POAG primary open angle glaucoma; C/D cup-to-disc ratio;  HVF humphrey visual field; GVF goldmann visual field; OCT optical coherence tomography; IOP intraocular pressure; BRVO Branch retinal vein occlusion; CRVO central retinal vein occlusion; CRAO central retinal artery occlusion; BRAO branch retinal artery occlusion; RT retinal tear; SB scleral buckle; PPV pars plana vitrectomy; VH Vitreous hemorrhage; PRP panretinal laser photocoagulation; IVK intravitreal kenalog; VMT vitreomacular traction; MH Macular hole;  NVD neovascularization of the disc; NVE neovascularization elsewhere; AREDS age related eye disease study; ARMD age related macular degeneration; POAG primary open angle glaucoma; EBMD epithelial/anterior basement membrane dystrophy; ACIOL anterior chamber intraocular lens; IOL intraocular lens; PCIOL posterior chamber intraocular lens; Phaco/IOL phacoemulsification with intraocular lens placement; [place] photorefractive keratectomy; LASIK laser assisted in situ keratomileusis; HTN hypertension; DM diabetes mellitus; COPD chronic obstructive pulmonary disease

## 2020-04-14 DIAGNOSIS — I509 Heart failure, unspecified: Secondary | ICD-10-CM | POA: Diagnosis not present

## 2020-04-14 DIAGNOSIS — Z8673 Personal history of transient ischemic attack (TIA), and cerebral infarction without residual deficits: Secondary | ICD-10-CM | POA: Diagnosis not present

## 2020-04-14 DIAGNOSIS — I4891 Unspecified atrial fibrillation: Secondary | ICD-10-CM | POA: Diagnosis not present

## 2020-04-14 DIAGNOSIS — R000 Tachycardia, unspecified: Secondary | ICD-10-CM | POA: Diagnosis not present

## 2020-04-14 DIAGNOSIS — S32000A Wedge compression fracture of unspecified lumbar vertebra, initial encounter for closed fracture: Secondary | ICD-10-CM | POA: Diagnosis not present

## 2020-04-15 ENCOUNTER — Other Ambulatory Visit: Payer: Self-pay

## 2020-04-15 ENCOUNTER — Encounter (HOSPITAL_COMMUNITY): Payer: Self-pay

## 2020-04-15 ENCOUNTER — Inpatient Hospital Stay (HOSPITAL_COMMUNITY)
Admission: EM | Admit: 2020-04-15 | Discharge: 2020-04-24 | DRG: 291 | Disposition: A | Discharge status: Skilled Nursing Facility | Payer: Medicare Other | Attending: Internal Medicine | Admitting: Internal Medicine

## 2020-04-15 ENCOUNTER — Emergency Department (HOSPITAL_COMMUNITY): Payer: Medicare Other

## 2020-04-15 DIAGNOSIS — I4819 Other persistent atrial fibrillation: Secondary | ICD-10-CM | POA: Diagnosis not present

## 2020-04-15 DIAGNOSIS — I2510 Atherosclerotic heart disease of native coronary artery without angina pectoris: Secondary | ICD-10-CM | POA: Diagnosis present

## 2020-04-15 DIAGNOSIS — J690 Pneumonitis due to inhalation of food and vomit: Secondary | ICD-10-CM | POA: Diagnosis present

## 2020-04-15 DIAGNOSIS — H409 Unspecified glaucoma: Secondary | ICD-10-CM | POA: Diagnosis not present

## 2020-04-15 DIAGNOSIS — K2270 Barrett's esophagus without dysplasia: Secondary | ICD-10-CM | POA: Diagnosis present

## 2020-04-15 DIAGNOSIS — D6859 Other primary thrombophilia: Secondary | ICD-10-CM | POA: Diagnosis present

## 2020-04-15 DIAGNOSIS — I361 Nonrheumatic tricuspid (valve) insufficiency: Secondary | ICD-10-CM | POA: Diagnosis not present

## 2020-04-15 DIAGNOSIS — T17908A Unspecified foreign body in respiratory tract, part unspecified causing other injury, initial encounter: Secondary | ICD-10-CM | POA: Diagnosis not present

## 2020-04-15 DIAGNOSIS — I4891 Unspecified atrial fibrillation: Secondary | ICD-10-CM | POA: Diagnosis not present

## 2020-04-15 DIAGNOSIS — Z888 Allergy status to other drugs, medicaments and biological substances status: Secondary | ICD-10-CM

## 2020-04-15 DIAGNOSIS — R531 Weakness: Secondary | ICD-10-CM | POA: Diagnosis not present

## 2020-04-15 DIAGNOSIS — Z90710 Acquired absence of both cervix and uterus: Secondary | ICD-10-CM

## 2020-04-15 DIAGNOSIS — J9809 Other diseases of bronchus, not elsewhere classified: Secondary | ICD-10-CM | POA: Diagnosis not present

## 2020-04-15 DIAGNOSIS — E872 Acidosis: Secondary | ICD-10-CM | POA: Diagnosis present

## 2020-04-15 DIAGNOSIS — R918 Other nonspecific abnormal finding of lung field: Secondary | ICD-10-CM | POA: Diagnosis not present

## 2020-04-15 DIAGNOSIS — R1310 Dysphagia, unspecified: Secondary | ICD-10-CM | POA: Diagnosis not present

## 2020-04-15 DIAGNOSIS — J9811 Atelectasis: Secondary | ICD-10-CM | POA: Diagnosis not present

## 2020-04-15 DIAGNOSIS — Z8679 Personal history of other diseases of the circulatory system: Secondary | ICD-10-CM

## 2020-04-15 DIAGNOSIS — Z880 Allergy status to penicillin: Secondary | ICD-10-CM

## 2020-04-15 DIAGNOSIS — N6019 Diffuse cystic mastopathy of unspecified breast: Secondary | ICD-10-CM | POA: Diagnosis present

## 2020-04-15 DIAGNOSIS — I10 Essential (primary) hypertension: Secondary | ICD-10-CM | POA: Diagnosis not present

## 2020-04-15 DIAGNOSIS — N179 Acute kidney failure, unspecified: Secondary | ICD-10-CM | POA: Diagnosis not present

## 2020-04-15 DIAGNOSIS — D649 Anemia, unspecified: Secondary | ICD-10-CM | POA: Diagnosis not present

## 2020-04-15 DIAGNOSIS — I358 Other nonrheumatic aortic valve disorders: Secondary | ICD-10-CM | POA: Diagnosis present

## 2020-04-15 DIAGNOSIS — I130 Hypertensive heart and chronic kidney disease with heart failure and stage 1 through stage 4 chronic kidney disease, or unspecified chronic kidney disease: Secondary | ICD-10-CM | POA: Diagnosis not present

## 2020-04-15 DIAGNOSIS — E785 Hyperlipidemia, unspecified: Secondary | ICD-10-CM | POA: Diagnosis not present

## 2020-04-15 DIAGNOSIS — L89322 Pressure ulcer of left buttock, stage 2: Secondary | ICD-10-CM | POA: Diagnosis not present

## 2020-04-15 DIAGNOSIS — Z743 Need for continuous supervision: Secondary | ICD-10-CM | POA: Diagnosis not present

## 2020-04-15 DIAGNOSIS — R21 Rash and other nonspecific skin eruption: Secondary | ICD-10-CM | POA: Diagnosis not present

## 2020-04-15 DIAGNOSIS — N183 Chronic kidney disease, stage 3 unspecified: Secondary | ICD-10-CM | POA: Diagnosis present

## 2020-04-15 DIAGNOSIS — L8990 Pressure ulcer of unspecified site, unspecified stage: Secondary | ICD-10-CM | POA: Insufficient documentation

## 2020-04-15 DIAGNOSIS — S2231XA Fracture of one rib, right side, initial encounter for closed fracture: Secondary | ICD-10-CM | POA: Diagnosis not present

## 2020-04-15 DIAGNOSIS — R0602 Shortness of breath: Secondary | ICD-10-CM

## 2020-04-15 DIAGNOSIS — I5083 High output heart failure: Secondary | ICD-10-CM | POA: Diagnosis not present

## 2020-04-15 DIAGNOSIS — I071 Rheumatic tricuspid insufficiency: Secondary | ICD-10-CM | POA: Diagnosis present

## 2020-04-15 DIAGNOSIS — Z8541 Personal history of malignant neoplasm of cervix uteri: Secondary | ICD-10-CM

## 2020-04-15 DIAGNOSIS — I509 Heart failure, unspecified: Secondary | ICD-10-CM | POA: Diagnosis not present

## 2020-04-15 DIAGNOSIS — Z823 Family history of stroke: Secondary | ICD-10-CM

## 2020-04-15 DIAGNOSIS — Z20822 Contact with and (suspected) exposure to covid-19: Secondary | ICD-10-CM | POA: Diagnosis not present

## 2020-04-15 DIAGNOSIS — Z86010 Personal history of colonic polyps: Secondary | ICD-10-CM

## 2020-04-15 DIAGNOSIS — J9601 Acute respiratory failure with hypoxia: Secondary | ICD-10-CM | POA: Diagnosis not present

## 2020-04-15 DIAGNOSIS — I517 Cardiomegaly: Secondary | ICD-10-CM | POA: Diagnosis not present

## 2020-04-15 DIAGNOSIS — N1831 Chronic kidney disease, stage 3a: Secondary | ICD-10-CM | POA: Diagnosis not present

## 2020-04-15 DIAGNOSIS — N189 Chronic kidney disease, unspecified: Secondary | ICD-10-CM | POA: Diagnosis not present

## 2020-04-15 DIAGNOSIS — R002 Palpitations: Secondary | ICD-10-CM | POA: Diagnosis present

## 2020-04-15 DIAGNOSIS — D6869 Other thrombophilia: Secondary | ICD-10-CM | POA: Diagnosis not present

## 2020-04-15 DIAGNOSIS — I700 Atherosclerosis of aorta: Secondary | ICD-10-CM | POA: Diagnosis not present

## 2020-04-15 DIAGNOSIS — Z9842 Cataract extraction status, left eye: Secondary | ICD-10-CM

## 2020-04-15 DIAGNOSIS — E43 Unspecified severe protein-calorie malnutrition: Secondary | ICD-10-CM | POA: Diagnosis not present

## 2020-04-15 DIAGNOSIS — J90 Pleural effusion, not elsewhere classified: Secondary | ICD-10-CM | POA: Diagnosis not present

## 2020-04-15 DIAGNOSIS — I5033 Acute on chronic diastolic (congestive) heart failure: Secondary | ICD-10-CM

## 2020-04-15 DIAGNOSIS — E876 Hypokalemia: Secondary | ICD-10-CM | POA: Diagnosis not present

## 2020-04-15 DIAGNOSIS — Z9841 Cataract extraction status, right eye: Secondary | ICD-10-CM

## 2020-04-15 DIAGNOSIS — J9690 Respiratory failure, unspecified, unspecified whether with hypoxia or hypercapnia: Secondary | ICD-10-CM | POA: Diagnosis not present

## 2020-04-15 DIAGNOSIS — N1832 Chronic kidney disease, stage 3b: Secondary | ICD-10-CM | POA: Diagnosis not present

## 2020-04-15 DIAGNOSIS — R609 Edema, unspecified: Secondary | ICD-10-CM | POA: Diagnosis not present

## 2020-04-15 DIAGNOSIS — Z7901 Long term (current) use of anticoagulants: Secondary | ICD-10-CM

## 2020-04-15 DIAGNOSIS — I272 Pulmonary hypertension, unspecified: Secondary | ICD-10-CM | POA: Diagnosis not present

## 2020-04-15 DIAGNOSIS — Z681 Body mass index (BMI) 19 or less, adult: Secondary | ICD-10-CM | POA: Diagnosis not present

## 2020-04-15 DIAGNOSIS — J849 Interstitial pulmonary disease, unspecified: Secondary | ICD-10-CM | POA: Diagnosis not present

## 2020-04-15 DIAGNOSIS — H9190 Unspecified hearing loss, unspecified ear: Secondary | ICD-10-CM | POA: Diagnosis present

## 2020-04-15 DIAGNOSIS — Z79899 Other long term (current) drug therapy: Secondary | ICD-10-CM

## 2020-04-15 DIAGNOSIS — Z7401 Bed confinement status: Secondary | ICD-10-CM | POA: Diagnosis not present

## 2020-04-15 DIAGNOSIS — M2550 Pain in unspecified joint: Secondary | ICD-10-CM | POA: Diagnosis not present

## 2020-04-15 DIAGNOSIS — Z86718 Personal history of other venous thrombosis and embolism: Secondary | ICD-10-CM | POA: Diagnosis not present

## 2020-04-15 DIAGNOSIS — I340 Nonrheumatic mitral (valve) insufficiency: Secondary | ICD-10-CM | POA: Diagnosis not present

## 2020-04-15 DIAGNOSIS — Z886 Allergy status to analgesic agent status: Secondary | ICD-10-CM

## 2020-04-15 DIAGNOSIS — I5031 Acute diastolic (congestive) heart failure: Secondary | ICD-10-CM | POA: Diagnosis not present

## 2020-04-15 DIAGNOSIS — I110 Hypertensive heart disease with heart failure: Secondary | ICD-10-CM | POA: Diagnosis not present

## 2020-04-15 LAB — CBC WITH DIFFERENTIAL/PLATELET
Abs Immature Granulocytes: 0.05 10*3/uL (ref 0.00–0.07)
Basophils Absolute: 0 10*3/uL (ref 0.0–0.1)
Basophils Relative: 0 %
Eosinophils Absolute: 0.1 10*3/uL (ref 0.0–0.5)
Eosinophils Relative: 1 %
HCT: 32.5 % — ABNORMAL LOW (ref 36.0–46.0)
Hemoglobin: 10.1 g/dL — ABNORMAL LOW (ref 12.0–15.0)
Immature Granulocytes: 1 %
Lymphocytes Relative: 6 %
Lymphs Abs: 0.5 10*3/uL — ABNORMAL LOW (ref 0.7–4.0)
MCH: 26.1 pg (ref 26.0–34.0)
MCHC: 31.1 g/dL (ref 30.0–36.0)
MCV: 84 fL (ref 80.0–100.0)
Monocytes Absolute: 0.6 10*3/uL (ref 0.1–1.0)
Monocytes Relative: 6 %
Neutro Abs: 7.8 10*3/uL — ABNORMAL HIGH (ref 1.7–7.7)
Neutrophils Relative %: 86 %
Platelets: 374 10*3/uL (ref 150–400)
RBC: 3.87 MIL/uL (ref 3.87–5.11)
RDW: 19.3 % — ABNORMAL HIGH (ref 11.5–15.5)
WBC: 9 10*3/uL (ref 4.0–10.5)
nRBC: 0 % (ref 0.0–0.2)

## 2020-04-15 LAB — COMPREHENSIVE METABOLIC PANEL
ALT: 18 U/L (ref 0–44)
AST: 22 U/L (ref 15–41)
Albumin: 2.5 g/dL — ABNORMAL LOW (ref 3.5–5.0)
Alkaline Phosphatase: 70 U/L (ref 38–126)
Anion gap: 11 (ref 5–15)
BUN: 15 mg/dL (ref 8–23)
CO2: 22 mmol/L (ref 22–32)
Calcium: 8.6 mg/dL — ABNORMAL LOW (ref 8.9–10.3)
Chloride: 106 mmol/L (ref 98–111)
Creatinine, Ser: 1.36 mg/dL — ABNORMAL HIGH (ref 0.44–1.00)
GFR calc Af Amer: 40 mL/min — ABNORMAL LOW (ref >60)
GFR calc non Af Amer: 35 mL/min — ABNORMAL LOW (ref >60)
Glucose, Bld: 108 mg/dL — ABNORMAL HIGH (ref 70–99)
Potassium: 4.2 mmol/L (ref 3.5–5.1)
Sodium: 139 mmol/L (ref 135–145)
Total Bilirubin: 0.6 mg/dL (ref 0.3–1.2)
Total Protein: 7.2 g/dL (ref 6.5–8.1)

## 2020-04-15 LAB — PROTIME-INR
INR: 1.5 — ABNORMAL HIGH (ref 0.8–1.2)
Prothrombin Time: 17.9 seconds — ABNORMAL HIGH (ref 11.4–15.2)

## 2020-04-15 LAB — SARS CORONAVIRUS 2 BY RT PCR (HOSPITAL ORDER, PERFORMED IN ~~LOC~~ HOSPITAL LAB): SARS Coronavirus 2: NEGATIVE

## 2020-04-15 LAB — DIGOXIN LEVEL
Digoxin Level: 0.7 ng/mL — ABNORMAL LOW (ref 0.8–2.0)
Digoxin Level: 0.6 ng/mL — ABNORMAL LOW (ref 0.8–2.0)

## 2020-04-15 LAB — BRAIN NATRIURETIC PEPTIDE: B Natriuretic Peptide: 528.5 pg/mL — ABNORMAL HIGH (ref 0.0–100.0)

## 2020-04-15 LAB — TSH: TSH: 1.144 u[IU]/mL (ref 0.350–4.500)

## 2020-04-15 LAB — LACTIC ACID, PLASMA
Lactic Acid, Venous: 0.7 mmol/L (ref 0.5–1.9)
Lactic Acid, Venous: 1.2 mmol/L (ref 0.5–1.9)

## 2020-04-15 LAB — PROCALCITONIN: Procalcitonin: 1.23 ng/mL

## 2020-04-15 LAB — MAGNESIUM: Magnesium: 2.5 mg/dL — ABNORMAL HIGH (ref 1.7–2.4)

## 2020-04-15 MED ORDER — FUROSEMIDE 10 MG/ML IJ SOLN
20.0000 mg | Freq: Once | INTRAMUSCULAR | Status: AC
  Administered 2020-04-15: 20 mg via INTRAVENOUS
  Filled 2020-04-15: qty 2

## 2020-04-15 MED ORDER — SODIUM CHLORIDE 0.9% FLUSH
3.0000 mL | Freq: Two times a day (BID) | INTRAVENOUS | Status: DC
  Administered 2020-04-17 – 2020-04-24 (×10): 3 mL via INTRAVENOUS

## 2020-04-15 MED ORDER — SODIUM CHLORIDE 0.9 % IV SOLN
500.0000 mg | Freq: Once | INTRAVENOUS | Status: AC
  Administered 2020-04-15: 500 mg via INTRAVENOUS
  Filled 2020-04-15: qty 500

## 2020-04-15 MED ORDER — IPRATROPIUM-ALBUTEROL 0.5-2.5 (3) MG/3ML IN SOLN: 3.0000 mL | RESPIRATORY_TRACT | Status: DC | PRN

## 2020-04-15 MED ORDER — SODIUM CHLORIDE 0.9% FLUSH
3.0000 mL | INTRAVENOUS | Status: DC | PRN
  Administered 2020-04-18: 3 mL via INTRAVENOUS

## 2020-04-15 MED ORDER — SODIUM CHLORIDE 0.9 % IV SOLN
1.0000 g | Freq: Once | INTRAVENOUS | Status: AC
  Administered 2020-04-15: 1 g via INTRAVENOUS
  Filled 2020-04-15: qty 10

## 2020-04-15 MED ORDER — METOPROLOL TARTRATE 5 MG/5ML IV SOLN
5.0000 mg | Freq: Once | INTRAVENOUS | Status: AC
  Administered 2020-04-15: 5 mg via INTRAVENOUS
  Filled 2020-04-15: qty 5

## 2020-04-15 MED ORDER — ATORVASTATIN CALCIUM 10 MG PO TABS
20.0000 mg | ORAL_TABLET | Freq: Every day | ORAL | Status: DC
  Administered 2020-04-16 – 2020-04-24 (×9): 20 mg via ORAL
  Filled 2020-04-15 (×9): qty 2

## 2020-04-15 MED ORDER — APIXABAN 2.5 MG PO TABS
2.5000 mg | ORAL_TABLET | Freq: Two times a day (BID) | ORAL | Status: DC
  Administered 2020-04-15 – 2020-04-24 (×18): 2.5 mg via ORAL
  Filled 2020-04-15 (×19): qty 1

## 2020-04-15 MED ORDER — SODIUM CHLORIDE 0.9 % IV SOLN
250.0000 mL | INTRAVENOUS | Status: DC | PRN
  Administered 2020-04-19: 20 mL via INTRAVENOUS
  Administered 2020-04-21: 250 mL via INTRAVENOUS

## 2020-04-15 MED ORDER — ONDANSETRON HCL 4 MG/2ML IJ SOLN: 4.0000 mg | Freq: Four times a day (QID) | INTRAMUSCULAR | Status: DC | PRN

## 2020-04-15 MED ORDER — SODIUM CHLORIDE 0.9 % IV BOLUS: 250.0000 mL | Freq: Once | INTRAVENOUS | Status: DC

## 2020-04-15 MED ORDER — FUROSEMIDE 10 MG/ML IJ SOLN
40.0000 mg | Freq: Two times a day (BID) | INTRAMUSCULAR | Status: DC
  Administered 2020-04-15 – 2020-04-23 (×16): 40 mg via INTRAVENOUS
  Filled 2020-04-15 (×16): qty 4

## 2020-04-15 MED ORDER — ACETAMINOPHEN 325 MG PO TABS
650.0000 mg | ORAL_TABLET | ORAL | Status: DC | PRN
  Administered 2020-04-22: 650 mg via ORAL
  Filled 2020-04-15 (×2): qty 2

## 2020-04-15 MED ORDER — HYDRALAZINE HCL 25 MG PO TABS
25.0000 mg | ORAL_TABLET | Freq: Two times a day (BID) | ORAL | Status: DC
  Administered 2020-04-15 – 2020-04-16 (×2): 25 mg via ORAL
  Filled 2020-04-15 (×2): qty 1

## 2020-04-15 MED ORDER — DIGOXIN 125 MCG PO TABS
0.1250 mg | ORAL_TABLET | Freq: Every day | ORAL | Status: DC
  Administered 2020-04-16: 0.125 mg via ORAL
  Filled 2020-04-15: qty 1

## 2020-04-15 MED ORDER — DILTIAZEM HCL-DEXTROSE 125-5 MG/125ML-% IV SOLN (PREMIX)
5.0000 mg/h | INTRAVENOUS | Status: DC
  Administered 2020-04-15: 5 mg/h via INTRAVENOUS
  Administered 2020-04-16: 7.5 mg/h via INTRAVENOUS
  Administered 2020-04-17 (×2): 12.5 mg/h via INTRAVENOUS
  Administered 2020-04-18: 10 mg/h via INTRAVENOUS
  Administered 2020-04-19: 5 mg/h via INTRAVENOUS
  Administered 2020-04-19 (×2): 15 mg/h via INTRAVENOUS
  Filled 2020-04-15 (×9): qty 125

## 2020-04-15 NOTE — ED Notes (Signed)
Admitting MD at bedside at this time.

## 2020-04-15 NOTE — ED Provider Notes (Signed)
[facility] EMERGENCY DEPARTMENT Provider Note   CSN: [id number] Arrival date & time: 04/15/20  1501     History Chief Complaint  Patient presents with  . Shortness of Breath    Kelli Peterson is a 86 y.o. female.  86 yo F with a chief complaints of shortness of breath.  This is been going on for a while she tells me.  At least greater than a week.  No cough or congestion no chest pain.  No nausea vomiting or diarrhea no abdominal pain.  States that she has just been very fatigued and feeling like her heart is racing.  Level 5 caveat acuity of condition.  The history is provided by the patient.  Shortness of Breath Severity:  Severe Onset quality:  Sudden Duration:  1 week Timing:  Constant Progression:  Worsening Chronicity:  New Relieved by:  Nothing Worsened by:  Nothing Ineffective treatments:  None tried Associated symptoms: no chest pain, no fever, no headaches, no vomiting and no wheezing        Past Medical History:  Diagnosis Date  . Adenomatous colon polyp 2007  . Atrial fibrillation ([place])   . Barrett's esophagus 2007  . Cerebellar hemorrhage ([place])   . Cervical cancer ([place])   . CHF (congestive heart failure) ([place])   . Closed pelvic fracture ([place])   . CVD (cardiovascular disease)   . DVT (deep venous thrombosis) ([place])   . Fibrocystic breast disease   . Fracture of pubic ramus ([place])   . Hyperlipidemia   . Hypertension   . Iron deficiency anemia     Patient Active Problem List   Diagnosis Date Noted  . Branch retinal vein occlusion with macular edema of left eye 01/23/2020  . Branch retinal vein occlusion of right eye 01/23/2020  . Exudative age-related macular degeneration of right eye with inactive choroidal neovascularization ([place]) 01/23/2020  . Intermediate stage nonexudative age-related macular degeneration of left eye 01/23/2020  . Protein-calorie malnutrition, severe 06/02/2018  . AKI (acute kidney injury) ([place]) 05/31/2018    . Dehydration 05/31/2018  . Essential hypertension 05/31/2018  . Atrial fibrillation, chronic ([place]) 05/31/2018  . COPD (chronic obstructive pulmonary disease) ([place]) 05/31/2018  . Glaucoma 05/31/2018  . Hyperlipidemia 05/31/2018  . Acute renal failure (ARF) ([place]) 05/31/2018  . Constipation 05/31/2018  . Acute right hip pain   . Atrial fibrillation with RVR ([place])   . Pelvic fracture, closed, initial encounter 02/15/2016  . Fracture of pubic ramus ([place]) 02/15/2016    Past Surgical History:  Procedure Laterality Date  . CATARACT EXTRACTION Right 02/15/2010   [facility]  . CATARACT EXTRACTION Left 01/30/2010   [facility]  . CATARACT EXTRACTION, BILATERAL  2010  . TOTAL ABDOMINAL HYSTERECTOMY  1964     OB History   No obstetric history on file.     Family History  Problem Relation Age of Onset  . CVA Father   . CVA Mother     Social History   Tobacco Use  . Smoking status: Never Smoker  . Smokeless tobacco: Never Used  Substance Use Topics  . Alcohol use: No  . Drug use: No    Home Medications Prior to Admission medications   Medication Sig Start Date End Date Taking? Authorizing Provider  acetaminophen (TYLENOL) 500 MG tablet Take 500 mg by mouth every 6 (six) hours as needed for pain.   Yes [provider]  amLODipine (NORVASC) 5 MG tablet Take 5 mg by mouth daily.  Yes [provider]  apixaban (ELIQUIS) 2.5 MG TABS tablet Take 2.5 mg by mouth 2 (two) times daily.   Yes [provider]  atorvastatin (LIPITOR) 20 MG tablet Take 20 mg by mouth daily.   Yes [provider]  brimonidine (ALPHAGAN) 0.2 % ophthalmic solution Place 1 drop into the right eye 2 (two) times daily. 05/01/18  Yes [provider]  denosumab (PROLIA) 60 MG/ML SOSY injection Inject 60 mg into the skin every 6 (six) months.   Yes [provider]  digoxin (DIGOX) 0.25 MG tablet Take 0.125 mg by mouth daily.   Yes [provider]  diltiazem  (TIAZAC) 180 MG 24 hr capsule Take 1 capsule (180 mg total) by mouth daily. Patient taking differently: Take 180 mg by mouth See admin instructions. Take 180 mg by mouth in the morning and in the evening 06/04/18  Yes Adhikari, Amrit, MD  dorzolamide (TRUSOPT) 2 % ophthalmic solution Place 1 drop into both eyes 2 (two) times daily.    Yes [provider]  guaiFENesin (MUCINEX) 600 MG 12 hr tablet Take 600 mg by mouth 2 (two) times daily as needed for to loosen phlegm.   Yes [provider]  hydrALAZINE (APRESOLINE) 25 MG tablet Take 1 tablet (25 mg total) by mouth 2 (two) times daily. Patient taking differently: Take 25 mg by mouth in the morning.  06/04/18  Yes Adhikari, Tamsen Meek, MD  latanoprost (XALATAN) 0.005 % ophthalmic solution Place 1 drop into both eyes daily at 12 noon.    Yes [provider]  Multiple Vitamin (MULTIVITAMIN WITH MINERALS) TABS tablet Take 1 tablet by mouth daily.   Yes [provider]  Netarsudil Dimesylate (RHOPRESSA) 0.02 % SOLN Place 1 drop into the right eye daily.    Yes [provider]  alendronate (FOSAMAX) 70 MG tablet Take 70 mg by mouth every Thursday. Take with a full glass of water on an empty stomach. Patient not taking: Reported on 04/15/2020    [provider]  dorzolamide-timolol (COSOPT) 22.3-6.8 MG/ML ophthalmic solution Place 1 drop into both eyes 2 (two) times daily. Patient not taking: Reported on 04/15/2020    [provider]  ferrous sulfate 325 (65 FE) MG tablet Take 325 mg by mouth daily with breakfast. Patient not taking: Reported on 04/15/2020    [provider]  HYDROcodone-acetaminophen (NORCO/VICODIN) 5-325 MG tablet Take 2 tablets by mouth every 6 (six) hours as needed. Patient not taking: Reported on 04/15/2020 08/07/19   Virgel Manifold, MD  metoprolol succinate (TOPROL-XL) 25 MG 24 hr tablet Take 1 tablet (25 mg total) by mouth daily. Patient not taking: Reported on 04/15/2020  06/07/18   Shelly Coss, MD  oxybutynin (DITROPAN) 5 MG tablet Take 5 mg by mouth 2 (two) times daily as needed for bladder spasms.  Patient not taking: Reported on 04/15/2020 05/01/18   [provider]  potassium chloride SA (K-DUR,KLOR-CON) 20 MEQ tablet Take 1 tablet (20 mEq total) by mouth daily for 14 days. Patient not taking: Reported on 04/15/2020 06/06/18 04/15/20  Shelly Coss, MD    Allergies    Penicillins, Zocor [simvastatin], and Nsaids  Review of Systems   Review of Systems  Constitutional: Negative for chills and fever.  HENT: Negative for congestion and rhinorrhea.   Eyes: Negative for redness and visual disturbance.  Respiratory: Positive for shortness of breath. Negative for wheezing.   Cardiovascular: Negative for chest pain and palpitations.  Gastrointestinal: Negative for nausea and vomiting.  Genitourinary: Negative for dysuria and urgency.  Musculoskeletal: Negative for arthralgias and myalgias.  Skin: Negative for pallor and wound.  Neurological: Negative for dizziness and headaches.    Physical Exam Updated Vital Signs BP 131/85   Pulse (!) 128   Resp (!) 21   SpO2 94%   Physical Exam Vitals and nursing note reviewed.  Constitutional:      General: She is not in acute distress.    Appearance: She is well-developed. She is not diaphoretic.  HENT:     Head: Normocephalic and atraumatic.  Eyes:     Pupils: Pupils are equal, round, and reactive to light.  Cardiovascular:     Rate and Rhythm: Tachycardia present. Rhythm irregularly irregular.     Heart sounds: No murmur heard.  No friction rub. No gallop.   Pulmonary:     Effort: Pulmonary effort is normal. Tachypnea present.     Breath sounds: No wheezing, rhonchi or rales.  Abdominal:     General: There is no distension.     Palpations: Abdomen is soft.     Tenderness: There is no abdominal tenderness.  Musculoskeletal:        General: No tenderness.     Cervical back: Normal range of  motion and neck supple.     Right lower leg: Edema present.     Left lower leg: Edema present.     Comments: L>R lower extremity edema up to the shins  Skin:    General: Skin is warm and dry.  Neurological:     Mental Status: She is alert and oriented to person, place, and time.  Psychiatric:        Behavior: Behavior normal.     ED Results / Procedures / Treatments   Labs (all labs ordered are listed, but only abnormal results are displayed) Labs Reviewed  CBC WITH DIFFERENTIAL/PLATELET - Abnormal; Notable for the following components:      Result Value   Hemoglobin 10.1 (*)    HCT 32.5 (*)    RDW 19.3 (*)    Neutro Abs 7.8 (*)    Lymphs Abs 0.5 (*)    All other components within normal limits  BRAIN NATRIURETIC PEPTIDE - Abnormal; Notable for the following components:   B Natriuretic Peptide 528.5 (*)    All other components within normal limits  PROTIME-INR - Abnormal; Notable for the following components:   Prothrombin Time 17.9 (*)    INR 1.5 (*)    All other components within normal limits  COMPREHENSIVE METABOLIC PANEL - Abnormal; Notable for the following components:   Glucose, Bld 108 (*)    Creatinine, Ser 1.36 (*)    Calcium 8.6 (*)    Albumin 2.5 (*)    GFR calc non Af Amer 35 (*)    GFR calc Af Amer 40 (*)    All other components within normal limits  MAGNESIUM - Abnormal; Notable for the following components:   Magnesium 2.5 (*)    All other components within normal limits  DIGOXIN LEVEL - Abnormal; Notable for the following components:   Digoxin Level 0.7 (*)    All other components within normal limits  SARS CORONAVIRUS 2 BY RT PCR (HOSPITAL ORDER, [facility])  CULTURE, BLOOD (ROUTINE X 2)  CULTURE, BLOOD (ROUTINE X 2)  LACTIC ACID, PLASMA  LACTIC ACID, PLASMA    EKG None  Radiology DG Chest Port 1 View  Result Date: 04/15/2020 CLINICAL DATA:  Shortness of  breath. EXAM: PORTABLE CHEST 1 VIEW COMPARISON:  Prior chest  radiograph 03/31/2020 and earlier FINDINGS: Unchanged cardiomegaly. Aortic atherosclerosis. There are interstitial opacities throughout both lungs, increased in conspicuity as compared to chest radiograph 03/31/2020. Additionally, there is left basilar opacity with silhouetting of the left hemidiaphragm which may reflect atelectasis or consolidation. Additionally, a small pleural effusion may be present. No evidence of pneumothorax. No acute bony abnormality identified. Chronic multilevel thoracic compression deformities. IMPRESSION: Interstitial prominence throughout both lungs, increased as compared to the prior exam of 03/31/2020. Findings are nonspecific, but may reflect interstitial edema or atypical/viral pneumonia. Opacity at the left lung base, consistent with atelectasis or consolidation. Unchanged cardiomegaly. Aortic Atherosclerosis (ICD10-I70.0). Electronically Signed   By: Kellie Simmering DO   On: 04/15/2020 16:15    Procedures Procedures (including critical care time)  Medications Ordered in ED Medications  cefTRIAXone (ROCEPHIN) 1 g in sodium chloride 0.9 % 100 mL IVPB (has no administration in time range)  azithromycin (ZITHROMAX) 500 mg in sodium chloride 0.9 % 250 mL IVPB (has no administration in time range)  metoprolol tartrate (LOPRESSOR) injection 5 mg (has no administration in time range)  furosemide (LASIX) injection 20 mg (has no administration in time range)  metoprolol tartrate (LOPRESSOR) injection 5 mg (5 mg Intravenous Given 04/15/20 1619)    ED Course  I have reviewed the triage vital signs and the nursing notes.  Pertinent labs & imaging results that were available during my care of the patient were reviewed by me and considered in my medical decision making (see chart for details).    MDM Rules/Calculators/A&P                          86 yo F with a chief complaints of shortness of breath.  Found to be hypoxic into the low 80s on room air.  Not on oxygen at home.   Heart rate into the 150s with A. fib with RVR.  Will obtain a laboratory evaluation chest x-ray give a small bolus of IV fluids give a dose of metoprolol.  Reassess.  Patient has had some heart rate reduction after metoprolol.  She is feeling much better.  Suspect most of her symptoms are due to the rate.  Patient's family has arrived.  States that she has been having symptoms for the past month.  Having off a lot of difficulty with rate control.  Has have had escalating doses of both diltiazem and metoprolol and recently started digoxin about 2 weeks ago.  Had a chest x-ray at that time which was reportedly normal.  No coughing no fevers no nausea vomiting or diarrhea.  Just describing profound fatigue.  Also noted to be hypoxic at home.  Dipping down to the 70s.  Lab work is returned.  No significant electrolyte abnormality.  Renal function is less wet than when she came in for an acute kidney injury though looks like it slightly higher than her baseline.  BNP elevated.  I discussed the case with Dr Davina Poke, cardiology.  He recommended medical admission and cardiology consult in the morning.  CRITICAL CARE Performed by: Cecilio Asper   Total critical care time: 35 minutes  Critical care time was exclusive of separately billable procedures and treating other patients.  Critical care was necessary to treat or prevent imminent or life-threatening deterioration.  Critical care was time spent personally by me on the following activities: development of treatment plan with patient and/or surrogate as  well as nursing, discussions with consultants, evaluation of patient's response to treatment, examination of patient, obtaining history from patient or surrogate, ordering and performing treatments and interventions, ordering and review of laboratory studies, ordering and review of radiographic studies, pulse oximetry and re-evaluation of patient's condition.   The patients results and plan were  reviewed and discussed.   Any x-rays performed were independently reviewed by myself.   Differential diagnosis were considered with the presenting HPI.  Medications  cefTRIAXone (ROCEPHIN) 1 g in sodium chloride 0.9 % 100 mL IVPB (has no administration in time range)  azithromycin (ZITHROMAX) 500 mg in sodium chloride 0.9 % 250 mL IVPB (has no administration in time range)  metoprolol tartrate (LOPRESSOR) injection 5 mg (has no administration in time range)  furosemide (LASIX) injection 20 mg (has no administration in time range)  metoprolol tartrate (LOPRESSOR) injection 5 mg (5 mg Intravenous Given 04/15/20 1619)    Vitals:   04/15/20 1511 04/15/20 1559  BP: 123/68 131/85  Pulse: (!) 150 (!) 128  Resp: (!) 22 (!) 21  SpO2: (!) 81% 94%    Final diagnoses:  High output heart failure (HCC)  Atrial fibrillation with rapid ventricular response ([place])    Admission/ observation were discussed with the admitting physician, patient and/or family and they are comfortable with the plan.   Final Clinical Impression(s) / ED Diagnoses Final diagnoses:  High output heart failure ([place])  Atrial fibrillation with rapid ventricular response [facility])    Rx / DC Orders ED Discharge Orders    None       Deno Etienne, DO 04/15/20 1809

## 2020-04-15 NOTE — H&P (Signed)
History and Physical    Kelli Peterson DOB: September 19, 1930 DOA: 04/15/2020  PCP: Jani Gravel, MD  Patient coming from: Home I have personally briefly reviewed patient's old medical records in [place]  Chief Complaint: Palpitation and shortness of breath since 3 week  HPI: Kelli Peterson is a 86 y.o. female with medical history significant of A. fib-on Eliquis, hypertension, hyperlipidemia, chronic diastolic CHF presents to emergency department with palpitation and shortness of breath since 3 weeks.  Patient's daughter at the bedside is the story and.  She tells me that patient has palpitation and shortness of breath since 3 weeks.  She was seen by her PCP and diltiazem dose was increased and and on follow-up appointment digoxin was added yesterday however due to persistent symptoms patient came to ER for further evaluation and management.  Patient's daughter reports leg swelling, weight gain, shortness of breath and decreased oxygen saturation in 70s and 80s.  Patient is not on home oxygen.  Patient cannot lie flat due to dizziness, she sleeps on a recliner.  She has chronic cough however denies wheezing, change in sputum color, fever, chills, nausea, vomiting, diarrhea, headache, blurry vision, chest pain, urinary or bowel changes.  She lives with her daughter at home.  Uses walker for ambulation.  No history of smoking, alcohol, street drug use.    ED Course: Upon arrival to ED: Patient's heart rate noted to be in the 130s, tachypneic, requiring 3 L of oxygen via nasal cannula.,  Afebrile with no leukocytosis, BNP: 528, digoxin level: 0.7, lactic acid, COVID-19, blood culture: Pending.  Chest x-ray shows interstitial prominence throughout both lungs, increased as compared to the prior exam.  Findings are nonspecific but may reflect interstitial edema or atypical/viral pneumonia.  Unchanged cardiomegaly.  Patient received metoprolol 5 mg, Lasix 20 mg and Rocephin and  azithromycin in ED.  EDP consulted cardiology.  [facility] hospitalist consulted for admission for A. fib with RVR and new onset acute hypoxemic respiratory failure.  Review of Systems: As per HPI otherwise negative.    Past Medical History:  Diagnosis Date  . Adenomatous colon polyp 2007  . Atrial fibrillation ([place])   . Barrett's esophagus 2007  . 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 Cervical cancer ([place])   . CHF (congestive heart failure) ([place])   . Closed pelvic fracture ([place])   . CVD (cardiovascular disease)   . DVT (deep venous thrombosis) ([place])   . Fibrocystic breast disease   . Fracture of pubic ramus ([place])   . Hyperlipidemia   . Hypertension   . Iron deficiency anemia     Past Surgical History:  Procedure Laterality Date  . CATARACT EXTRACTION Right 02/15/2010   [facility]  . CATARACT EXTRACTION Left 01/30/2010   [facility]  . CATARACT EXTRACTION, BILATERAL  2010  . TOTAL ABDOMINAL HYSTERECTOMY  1964     reports that she has never smoked. She has never used smokeless tobacco. She reports that she does not drink alcohol and does not use drugs.  Allergies  Allergen Reactions  . Penicillins Swelling and Other (See Comments)    Ankles and feet swell  . Zocor [Simvastatin] Other (See Comments)    Weak and dizzy  . Nsaids Other (See Comments)    Tylenol is all that is permitted    Family History  Problem Relation Age of Onset  . CVA Father   . CVA Mother     Prior to Admission medications   Medication Sig Start Date  End Date Taking? Authorizing Provider  acetaminophen (TYLENOL) 500 MG tablet Take 500 mg by mouth every 6 (six) hours as needed for pain.   Yes [provider]  amLODipine (NORVASC) 5 MG tablet Take 5 mg by mouth daily.   Yes [provider]  apixaban (ELIQUIS) 2.5 MG TABS tablet Take 2.5 mg by mouth 2 (two) times daily.   Yes [provider]  atorvastatin (LIPITOR) 20 MG tablet Take 20 mg by mouth daily.   Yes [provider]    brimonidine (ALPHAGAN) 0.2 % ophthalmic solution Place 1 drop into the right eye 2 (two) times daily. 05/01/18  Yes [provider]  denosumab (PROLIA) 60 MG/ML SOSY injection Inject 60 mg into the skin every 6 (six) months.   Yes [provider]  digoxin (DIGOX) 0.25 MG tablet Take 0.125 mg by mouth daily.   Yes [provider]  diltiazem (TIAZAC) 180 MG 24 hr capsule Take 1 capsule (180 mg total) by mouth daily. Patient taking differently: Take 180 mg by mouth See admin instructions. Take 180 mg by mouth in the morning and in the evening 06/04/18  Yes Adhikari, Amrit, MD  dorzolamide (TRUSOPT) 2 % ophthalmic solution Place 1 drop into both eyes 2 (two) times daily.    Yes [provider]  guaiFENesin (MUCINEX) 600 MG 12 hr tablet Take 600 mg by mouth 2 (two) times daily as needed for to loosen phlegm.   Yes [provider]  hydrALAZINE (APRESOLINE) 25 MG tablet Take 1 tablet (25 mg total) by mouth 2 (two) times daily. Patient taking differently: Take 25 mg by mouth in the morning.  06/04/18  Yes Adhikari, Tamsen Meek, MD  latanoprost (XALATAN) 0.005 % ophthalmic solution Place 1 drop into both eyes daily at 12 noon.    Yes [provider]  Multiple Vitamin (MULTIVITAMIN WITH MINERALS) TABS tablet Take 1 tablet by mouth daily.   Yes [provider]  Netarsudil Dimesylate (RHOPRESSA) 0.02 % SOLN Place 1 drop into the right eye daily.    Yes [provider]  alendronate (FOSAMAX) 70 MG tablet Take 70 mg by mouth every Thursday. Take with a full glass of water on an empty stomach. Patient not taking: Reported on 04/15/2020    [provider]  dorzolamide-timolol (COSOPT) 22.3-6.8 MG/ML ophthalmic solution Place 1 drop into both eyes 2 (two) times daily. Patient not taking: Reported on 04/15/2020    [provider]  ferrous sulfate 325 (65 FE) MG tablet Take 325 mg by mouth daily with breakfast. Patient not taking: Reported  on 04/15/2020    [provider]  HYDROcodone-acetaminophen (NORCO/VICODIN) 5-325 MG tablet Take 2 tablets by mouth every 6 (six) hours as needed. Patient not taking: Reported on 04/15/2020 08/07/19   Virgel Manifold, MD  metoprolol succinate (TOPROL-XL) 25 MG 24 hr tablet Take 1 tablet (25 mg total) by mouth daily. Patient not taking: Reported on 04/15/2020 06/07/18   Shelly Coss, MD  oxybutynin (DITROPAN) 5 MG tablet Take 5 mg by mouth 2 (two) times daily as needed for bladder spasms.  Patient not taking: Reported on 04/15/2020 05/01/18   [provider]  potassium chloride SA (K-DUR,KLOR-CON) 20 MEQ tablet Take 1 tablet (20 mEq total) by mouth daily for 14 days. Patient not taking: Reported on 04/15/2020 06/06/18 04/15/20  Shelly Coss, MD    Physical Exam: Vitals:   04/15/20 1511 04/15/20 1559  BP: 123/68 131/85  Pulse: (!) 150 (!) 128  Resp: (!) 22 [place])  21  SpO2: (!) 81% 94%    Constitutional: NAD, calm,, on 3 L of oxygen via nasal cannula, alert and communicating well  eyes: PERRL, lids and conjunctivae normal ENMT: Mucous membranes are moist. Posterior pharynx clear of any exudate or lesions.Normal dentition.  Neck: normal, supple, no masses, no thyromegaly Respiratory: Decreased breath sounds noted on the bases.  No wheezing or rhonchi Cardiovascular: Irregularly irregular rhythm.  2+ bilateral pitting edema positive. 2+ pedal pulses. No carotid bruits.  Abdomen: no tenderness, no masses palpated. No hepatosplenomegaly. Bowel sounds positive.  Musculoskeletal: no clubbing / cyanosis. No joint deformity upper and lower extremities. Good ROM, no contractures. Normal muscle tone.  Skin: no rashes, lesions, ulcers. No induration Neurologic: CN 2-12 grossly intact. Sensation intact, DTR normal. Strength 5/5 in all 4.  Psychiatric: Normal judgment and insight. Alert and oriented x 3. Normal mood.    Labs on Admission: I have personally reviewed following labs and imaging  studies  CBC: Recent Labs  Lab 04/15/20 1541  WBC 9.0  NEUTROABS 7.8*  HGB 10.1*  HCT 32.5*  MCV 84.0  PLT 299   Basic Metabolic Panel: Recent Labs  Lab 04/15/20 1622  NA 139  K 4.2  CL 106  CO2 22  GLUCOSE 108*  BUN 15  CREATININE 1.36*  CALCIUM 8.6*  MG 2.5*   GFR: CrCl cannot be calculated (Unknown ideal weight.). Liver Function Tests: Recent Labs  Lab 04/15/20 1622  AST 22  ALT 18  ALKPHOS 70  BILITOT 0.6  PROT 7.2  ALBUMIN 2.5*   No results for input(s): LIPASE, AMYLASE in the last 168 hours. No results for input(s): AMMONIA in the last 168 hours. Coagulation Profile: Recent Labs  Lab 04/15/20 1629  INR 1.5*   Cardiac Enzymes: No results for input(s): CKTOTAL, CKMB, CKMBINDEX, TROPONINI in the last 168 hours. BNP (last 3 results) No results for input(s): PROBNP in the last 8760 hours. HbA1C: No results for input(s): HGBA1C in the last 72 hours. CBG: No results for input(s): GLUCAP in the last 168 hours. Lipid Profile: No results for input(s): CHOL, HDL, LDLCALC, TRIG, CHOLHDL, LDLDIRECT in the last 72 hours. Thyroid Function Tests: No results for input(s): TSH, T4TOTAL, FREET4, T3FREE, THYROIDAB in the last 72 hours. Anemia Panel: No results for input(s): VITAMINB12, FOLATE, FERRITIN, TIBC, IRON, RETICCTPCT in the last 72 hours. Urine analysis:    Component Value Date/Time   COLORURINE YELLOW 05/31/2018 1913   APPEARANCEUR CLEAR 05/31/2018 1913   LABSPEC 1.010 05/31/2018 1913   PHURINE 5.0 05/31/2018 1913   GLUCOSEU NEGATIVE 05/31/2018 1913   HGBUR NEGATIVE 05/31/2018 1913   BILIRUBINUR NEGATIVE 05/31/2018 1913   KETONESUR NEGATIVE 05/31/2018 1913   PROTEINUR NEGATIVE 05/31/2018 1913   NITRITE NEGATIVE 05/31/2018 1913   LEUKOCYTESUR NEGATIVE 05/31/2018 1913    Radiological Exams on Admission: DG Chest Port 1 View  Result Date: 04/15/2020 CLINICAL DATA:  Shortness of breath. EXAM: PORTABLE CHEST 1 VIEW COMPARISON:  Prior chest  radiograph 03/31/2020 and earlier FINDINGS: Unchanged cardiomegaly. Aortic atherosclerosis. There are interstitial opacities throughout both lungs, increased in conspicuity as compared to chest radiograph 03/31/2020. Additionally, there is left basilar opacity with silhouetting of the left hemidiaphragm which may reflect atelectasis or consolidation. Additionally, a small pleural effusion may be present. No evidence of pneumothorax. No acute bony abnormality identified. Chronic multilevel thoracic compression deformities. IMPRESSION: Interstitial prominence throughout both lungs, increased as compared to the prior exam of 03/31/2020. Findings are nonspecific, but may reflect interstitial edema or atypical/viral  pneumonia. Opacity at the left lung base, consistent with atelectasis or consolidation. Unchanged cardiomegaly. Aortic Atherosclerosis (ICD10-I70.0). Electronically Signed   By: Kellie Simmering DO   On: 04/15/2020 16:15    EKG: Independently reviewed.  A. fib with RVR. Assessment/Plan Principal Problem:   Acute hypoxemic respiratory failure (HCC) Active Problems:   Atrial fibrillation with RVR (HCC)   Essential hypertension   CKD (chronic kidney disease), stage III   Acute hypoxemic respiratory failure: Secondary to acute on chronic CHF/ less likely pneumonia? -Patient is requiring 3 L of oxygen via nasal cannula.  She has leg swelling and weight gain.  Reviewed chest x-ray.  BNP: 528.  COVID-19 pending. -Patient received Lasix 20 mg, Rocephin and azithromycin in ED. -Admit patient at stepdown unit for close monitoring.  On continuous pulse ox.  We will try to wean off of oxygen as tolerated. -Start on Lasix 40 IV twice daily.  Strict INO's and daily weight. -No transthoracic echo in the system -We will order transthoracic echo.  Check TSH.  Check procalcitonin level.  Hold off antibiotics at this time-as chest x-ray findings are likely secondary to fluid overload. -Monitor electrolytes and  kidney function closely. -DuoNebs as needed  A. fib with RVR: -Received metoprolol 5 mg once in ED. -We will start patient on Cardizem drip.  On telemetry.  Continue Eliquis. -Monitor heart rate closely. -EDP consulted cardiology-await recommendations  Hypertension: Stable -Continue hydralazine, hold amlodipine and metoprolol as patient is on Cardizem drip -Monitor blood pressure closely  Hyperlipidemia: Continue Lipitor  Normocytic anemia: H&H: 10.1/32.5 was 13.2/41.28 months ago -No active bleeding.  Monitor H&H closely.  Transfuse as needed.  AKI on CKD stage IIIb: -Creatinine: 1.36, GFR: 35 (previous creatinine: 1.89, GFR: 58) -Avoid nephrotoxic medication.  Repeat BMP tomorrow a.m.  DVT prophylaxis: Eliquis/SCD Code Status: Full code-confirmed with patient's daughter Family Communication: Patient's daughter present at bedside.  Plan of care discussed with patient in length and she verbalized understanding and agreed with it. Disposition Plan: Home in 2 to 3 days Consults called: Cardiology by EDP  admission status: Inpatient  Mckinley Jewel MD [facility]  If 7PM-7AM, please contact night-coverage [email or web address] Password [facility]  04/15/2020, 7:11 PM

## 2020-04-15 NOTE — ED Triage Notes (Signed)
Pt presents to ED from home with complaints of SOB. Denies CP, n/v, diarrhea, dizziness. Denies using oxygen at home. Patient has labored breathing in triage with spo2 81%- placed on 6LPM [place]

## 2020-04-16 ENCOUNTER — Inpatient Hospital Stay (HOSPITAL_COMMUNITY): Payer: Medicare Other

## 2020-04-16 DIAGNOSIS — N179 Acute kidney failure, unspecified: Secondary | ICD-10-CM

## 2020-04-16 DIAGNOSIS — N189 Chronic kidney disease, unspecified: Secondary | ICD-10-CM

## 2020-04-16 DIAGNOSIS — I361 Nonrheumatic tricuspid (valve) insufficiency: Secondary | ICD-10-CM

## 2020-04-16 DIAGNOSIS — I10 Essential (primary) hypertension: Secondary | ICD-10-CM | POA: Diagnosis not present

## 2020-04-16 DIAGNOSIS — I272 Pulmonary hypertension, unspecified: Secondary | ICD-10-CM | POA: Insufficient documentation

## 2020-04-16 DIAGNOSIS — I5033 Acute on chronic diastolic (congestive) heart failure: Secondary | ICD-10-CM | POA: Diagnosis not present

## 2020-04-16 DIAGNOSIS — I340 Nonrheumatic mitral (valve) insufficiency: Secondary | ICD-10-CM

## 2020-04-16 DIAGNOSIS — I4891 Unspecified atrial fibrillation: Secondary | ICD-10-CM

## 2020-04-16 DIAGNOSIS — R0602 Shortness of breath: Secondary | ICD-10-CM

## 2020-04-16 LAB — BASIC METABOLIC PANEL
Anion gap: 14 (ref 5–15)
BUN: 14 mg/dL (ref 8–23)
CO2: 21 mmol/L — ABNORMAL LOW (ref 22–32)
Calcium: 8.2 mg/dL — ABNORMAL LOW (ref 8.9–10.3)
Chloride: 104 mmol/L (ref 98–111)
Creatinine, Ser: 1.19 mg/dL — ABNORMAL HIGH (ref 0.44–1.00)
GFR calc Af Amer: 48 mL/min — ABNORMAL LOW (ref >60)
GFR calc non Af Amer: 41 mL/min — ABNORMAL LOW (ref >60)
Glucose, Bld: 95 mg/dL (ref 70–99)
Potassium: 4 mmol/L (ref 3.5–5.1)
Sodium: 139 mmol/L (ref 135–145)

## 2020-04-16 LAB — ECHOCARDIOGRAM COMPLETE

## 2020-04-16 LAB — PROCALCITONIN: Procalcitonin: 1.06 ng/mL

## 2020-04-16 NOTE — ED Notes (Signed)
RN spoke to pt's daughter and updated her on pt current status.

## 2020-04-16 NOTE — Progress Notes (Signed)
  Echocardiogram 2D Echocardiogram has been performed.  Kelli Peterson 04/16/2020, 12:03 PM

## 2020-04-16 NOTE — ED Notes (Signed)
Kelli Peterson -

## 2020-04-16 NOTE — Progress Notes (Signed)
PROGRESS NOTE    Kelli Peterson  ZOX:[id number] DOB: 08-Mar-1930 DOA: 04/15/2020 PCP: Kelli Gravel, MD  Brief Narrative:  HPI per Dr. Early Osmond on 04/15/20 Kelli Peterson is a 86 y.o. female with medical history significant of A. fib-on Eliquis, hypertension, hyperlipidemia, chronic diastolic CHF presents to emergency department with palpitation and shortness of breath since 3 weeks.  Patient's daughter at the bedside is the story and.  She tells me that patient has palpitation and shortness of breath since 3 weeks.  She was seen by her PCP and diltiazem dose was increased and and on follow-up appointment digoxin was added yesterday however due to persistent symptoms patient came to ER for further evaluation and management.  Patient's daughter reports leg swelling, weight gain, shortness of breath and decreased oxygen saturation in 70s and 80s.  Patient is not on home oxygen.  Patient cannot lie flat due to dizziness, she sleeps on a recliner.  She has chronic cough however denies wheezing, change in sputum color, fever, chills, nausea, vomiting, diarrhea, headache, blurry vision, chest pain, urinary or bowel changes.  She lives with her daughter at home.  Uses walker for ambulation.  No history of smoking, alcohol, street drug use.    ED Course: Upon arrival to ED: Patient's heart rate noted to be in the 130s, tachypneic, requiring 3 L of oxygen via nasal cannula.,  Afebrile with no leukocytosis, BNP: 528, digoxin level: 0.7, lactic acid, COVID-19, blood culture: Pending.  Chest x-ray shows interstitial prominence throughout both lungs, increased as compared to the prior exam.  Findings are nonspecific but may reflect interstitial edema or atypical/viral pneumonia.  Unchanged cardiomegaly.  Patient received metoprolol 5 mg, Lasix 20 mg and Rocephin and azithromycin in ED.  EDP consulted cardiology.  [facility] consulted for admission for A. fib with RVR and new onset acute hypoxemic  respiratory failure.  **Interim History  He is to remain slight dyspneic and has been wearing oxygen.  Family states that they have noticed worsening swelling.  Has never seen a cardiologist but has had PCP try and manage.  Echocardiogram has been done but still pending read  Assessment & Plan:   Principal Problem:   Acute hypoxemic respiratory failure (HCC) Active Problems:   Atrial fibrillation with RVR (HCC)   Essential hypertension   CKD (chronic kidney disease), stage III  Acute Respiratory Failure with Hypoxia Secondary to acute on chronic diastolic CHF/ less likely pneumonia? -Patient is requiring 3 L of oxygen via nasal cannula.   -She has leg swelling and weight gain and some orthopnea.  -Reviewed chest x-ray.  BNP: 528.  COVID-19 Negative . -Patient received Lasix 20 mg, Rocephin and azithromycin in ED. -Admit patient at stepdown unit for close monitoring.  -SpO2: 96 % O2 Flow Rate (L/min): 3 L/min -On continuous pulse ox.  We will try to wean off of oxygen as tolerated. -Start on Lasix 40 IV twice every 12 hours.  Strict INO's and daily weight. -No transthoracic echo in the system but have ordered one and has been done -Check TSH and was 1.144.  Check procalcitonin level and was 1.23 and trended down to 1.06.   -Hold off Further  antibiotics at this time-as chest x-ray findings are likely secondary to fluid overload as patient has been afebrile and has no leukocytosis -Monitor electrolytes and kidney function closely. -DuoNebs as needed but may need to change to Xopenex/Atrovent given her A. fib -Continue to monitor for signs and symptoms of volume overload  and repeat chest x-ray in a.m.  Persistent A. Fib with RVR -Received metoprolol 5 mg once in ED. -Recently has been seeing her PCP who have been adjusting medication and she has had prior to admission medications with metoprolol XL 25 mg p.o. daily, diltiazem 180 mg p.o. daily, hydroxyzine 0.125 mg daily and she is  currently anticoagulated with low-dose Eliquis -She is given IV metoprolol and started on IV diltiazem with heart rate control improvement  -Continue with anticoagulation with low-dose Eliquis -Cardiology recommending holding hydralazine and amlodipine to allow for greater blood pressure and continue diltiazem for now and titrate stop transition to p.o. -CHA2DS2-VASc is at least a 5 for Age, Sex, HTN, and CHF Hx -Monitor heart rate closely. -EDP consulted Cardiology and they recommend continuing diltiazem for now and titrating as blood pressure allows and once heart rate is optional transitioning to p.o.  They could consider amiodarone however  Acquired Thrombophilia -CHA2DS2-VASc is at least a 5 for Age, Sex, HTN, and CHF Hx -Continue with low-dose Eliquis  Hypertension -On the softer side -Continue diltiazem drip -Cardiology recommending holding hydralazine and amlodipine to allow for greater blood pressure -Monitor blood pressure closely  Hyperlipidemia -Continue Atorvastatin 20 mg po Daily   Metabolic Acidosis -The patient's CO2 is 21, anion gap is 14, chloride level is 104 -We will continue to monitor and trend and repeat CMP in a.m.  Normocytic Anemia -H&H: 10.1/32.5 was 13.2/41.28 months ago; not repeated this morning but will repeat in a.m. -No active bleeding.  Monitor H&H closely.  Transfuse as needed. -Check anemia panel in the a.m. -Continue to monitor for signs and symptoms of bleeding; currently no overt bleeding with  -Repeat CBC in a.m.  AKI on CKD stage IIIb -Creatinine: 1.36, GFR: 35 (previous creatinine: 1.89, GFR: 58) -Now improving with diuresis and now BUN/creatinine is 14/1.9  -Continue to avoid nephrotoxic medications, contrast dyes, hypotension and renally adjust medications -Repeat CMP in a.m.  DVT prophylaxis: Anticoagulated with Apixaban 2.5 mg po BID  Code Status: FULL CODE  Family Communication: Discussed with family at bedside  Disposition  Plan:   Status is: Inpatient  Remains inpatient appropriate because:Ongoing diagnostic testing needed not appropriate for outpatient work up, Unsafe d/c plan, IV treatments appropriate due to intensity of illness or inability to take PO and Inpatient level of care appropriate due to severity of illness   Dispo: The patient is from: Home              Anticipated d/c is to: TBD              Anticipated d/c date is: 2 days              Patient currently is not medically stable to d/c.  Consultants:   Cardiology   Procedures: ECHOCARDIOGRAM (done and pending read)  Antimicrobials:  Anti-infectives (From admission, onward)   Start     Dose/Rate Route Frequency Ordered Stop   04/15/20 1630  cefTRIAXone (ROCEPHIN) 1 g in sodium chloride 0.9 % 100 mL IVPB        1 g 200 mL/hr over 30 Minutes Intravenous  Once 04/15/20 1620 04/15/20 1920   04/15/20 1630  azithromycin (ZITHROMAX) 500 mg in sodium chloride 0.9 % 250 mL IVPB        500 mg 250 mL/hr over 60 Minutes Intravenous  Once 04/15/20 1620 04/15/20 2046      Subjective: And examined at bedside she is hard of hearing but states that she feels  little short of breath.  Feels a little bit better since coming in.  Continues to have some lower extremity swelling.  No chest pain, lightheadedness or dizziness.  No other concerns complaints at this time.  Objective: Vitals:   04/16/20 1154 04/16/20 1155 04/16/20 1300 04/16/20 1306  BP: 99/65  107/65   Pulse: (!) 103 97 (!) 108 (!) 109  Resp: 13 12 16 20   Temp:      TempSrc:      SpO2: 98% 97% 97% 98%    Intake/Output Summary (Last 24 hours) at 04/16/2020 1442 Last data filed at 04/16/2020 0645 Gross per 24 hour  Intake --  Output 800 ml  Net -800 ml   There were no vitals filed for this visit.  Examination: Physical Exam:  Constitutional: WN/WD thin Caucasian female currently in NAD and appears calm and comfortable Eyes: Lids and conjunctivae normal, sclerae anicteric  ENMT:  External Ears, Nose appear normal. Hard of Hearing Neck: Appears normal, supple, no cervical masses, normal ROM, no appreciable thyromegaly Respiratory: Diminished to auscultation bilaterally with coarse breath sounds and some crackles but no appreciable wheezing, rales, rhonchi.  She has unlabored breathing but she is wearing supplemental oxygen via nasal cannula Cardiovascular: Irregularly irregular and tachycardic, no murmurs / rubs / gallops. S1 and S2 auscultated.  1+ extremity edema. Abdomen: Soft, non-tender, non-distended. Bowel sounds positive.  GU: Deferred. Musculoskeletal: No clubbing / cyanosis of digits/nails. No joint deformity upper and lower extremities.  Skin: No rashes, lesions, ulcers on limited skin evaluation. No induration; Warm and dry.  Neurologic: CN 2-12 grossly intact with no focal deficits. Romberg sign and cerebellar reflexes not assessed.  Psychiatric: Normal judgment and insight. Alert and oriented x 3. Normal mood and appropriate affect.   Data Reviewed: I have personally reviewed following labs and imaging studies  CBC: Recent Labs  Lab 04/15/20 1541  WBC 9.0  NEUTROABS 7.8*  HGB 10.1*  HCT 32.5*  MCV 84.0  PLT 494   Basic Metabolic Panel: Recent Labs  Lab 04/15/20 1622 04/16/20 0330  NA 139 139  K 4.2 4.0  CL 106 104  CO2 22 21*  GLUCOSE 108* 95  BUN 15 14  CREATININE 1.36* 1.19*  CALCIUM 8.6* 8.2*  MG 2.5*  --    GFR: CrCl cannot be calculated (Unknown ideal weight.). Liver Function Tests: Recent Labs  Lab 04/15/20 1622  AST 22  ALT 18  ALKPHOS 70  BILITOT 0.6  PROT 7.2  ALBUMIN 2.5*   No results for input(s): LIPASE, AMYLASE in the last 168 hours. No results for input(s): AMMONIA in the last 168 hours. Coagulation Profile: Recent Labs  Lab 04/15/20 1629  INR 1.5*   Cardiac Enzymes: No results for input(s): CKTOTAL, CKMB, CKMBINDEX, TROPONINI in the last 168 hours. BNP (last 3 results) No results for input(s): PROBNP  in the last 8760 hours. HbA1C: No results for input(s): HGBA1C in the last 72 hours. CBG: No results for input(s): GLUCAP in the last 168 hours. Lipid Profile: No results for input(s): CHOL, HDL, LDLCALC, TRIG, CHOLHDL, LDLDIRECT in the last 72 hours. Thyroid Function Tests: Recent Labs    04/15/20 2044  TSH 1.144   Anemia Panel: No results for input(s): VITAMINB12, FOLATE, FERRITIN, TIBC, IRON, RETICCTPCT in the last 72 hours. Sepsis Labs: Recent Labs  Lab 04/15/20 2044 04/15/20 2055 04/16/20 0330  PROCALCITON 1.23  --  1.06  LATICACIDVEN 1.2 0.7  --     Recent Results (from the past 240  hour(s))  Blood culture (routine x 2)     Status: None (Preliminary result)   Collection Time: 04/15/20  4:25 PM   Specimen: BLOOD RIGHT HAND  Result Value Ref Range Status   Specimen Description BLOOD RIGHT HAND  Final   Special Requests   Final    BOTTLES DRAWN AEROBIC AND ANAEROBIC Blood Culture results may not be optimal due to an inadequate volume of blood received in culture bottles   Culture   Final    NO GROWTH < 24 HOURS Performed at [facility], [place] [address], [place], [place] [address]    Report Status PENDING  Incomplete  Blood culture (routine x 2)     Status: None (Preliminary result)   Collection Time: 04/15/20  4:40 PM   Specimen: BLOOD RIGHT ARM  Result Value Ref Range Status   Specimen Description BLOOD RIGHT ARM  Final   Special Requests   Final    BOTTLES DRAWN AEROBIC AND ANAEROBIC Blood Culture results may not be optimal due to an excessive volume of blood received in culture bottles   Culture   Final    NO GROWTH < 24 HOURS Performed at [facility], [place] [address] [place], [place] [address]    Report Status PENDING  Incomplete  SARS Coronavirus 2 by RT PCR (hospital order, performed in [facility] hospital lab) Nasopharyngeal Nasopharyngeal Swab     Status: None   Collection Time: 04/15/20  9:52 PM   Specimen: Nasopharyngeal Swab  Result  Value Ref Range Status   SARS Coronavirus 2 NEGATIVE NEGATIVE Final    Comment: (NOTE) SARS-CoV-2 target nucleic acids are NOT DETECTED.  The SARS-CoV-2 RNA is generally detectable in upper and lower respiratory specimens during the acute phase of infection. The lowest concentration of SARS-CoV-2 viral copies this assay can detect is 250 copies / mL. A negative result does not preclude SARS-CoV-2 infection and should not be used as the sole basis for treatment or other patient management decisions.  A negative result may occur with improper specimen collection / handling, submission of specimen other than nasopharyngeal swab, presence of viral mutation(s) within the areas targeted by this assay, and inadequate number of viral copies (<250 copies / mL). A negative result must be combined with clinical observations, patient history, and epidemiological information.  Fact Sheet for Patients:   StrictlyIdeas.no  Fact Sheet for Healthcare Providers: BankingDealers.co.za  This test is not yet approved or  cleared by the [place] FDA and has been authorized for detection and/or diagnosis of SARS-CoV-2 by FDA under an Emergency Use Authorization (EUA).  This EUA will remain in effect (meaning this test can be used) for the duration of the COVID-19 declaration under Section 564(b)(1) of the Act, 21 U.S.C. section 360bbb-3(b)(1), unless the authorization is terminated or revoked sooner.  Performed at [facility], [place] [address] [place], [place] [address]      RN Pressure Injury Documentation:     Estimated body mass index is 21.96 kg/m as calculated from the following:   Height as of 08/07/19: 5' (1.524 m).   Weight as of 08/07/19: 51 kg.  Malnutrition Type:      Malnutrition Characteristics:      Nutrition Interventions:    Radiology Studies: DG Chest Port 1 View  Result Date: 04/15/2020 CLINICAL DATA:   Shortness of breath. EXAM: PORTABLE CHEST 1 VIEW COMPARISON:  Prior chest radiograph 03/31/2020 and earlier FINDINGS: Unchanged cardiomegaly. Aortic atherosclerosis. There are  interstitial opacities throughout both lungs, increased in conspicuity as compared to chest radiograph 03/31/2020. Additionally, there is left basilar opacity with silhouetting of the left hemidiaphragm which may reflect atelectasis or consolidation. Additionally, a small pleural effusion may be present. No evidence of pneumothorax. No acute bony abnormality identified. Chronic multilevel thoracic compression deformities. IMPRESSION: Interstitial prominence throughout both lungs, increased as compared to the prior exam of 03/31/2020. Findings are nonspecific, but may reflect interstitial edema or atypical/viral pneumonia. Opacity at the left lung base, consistent with atelectasis or consolidation. Unchanged cardiomegaly. Aortic Atherosclerosis (ICD10-I70.0). Electronically Signed   By: Kellie Simmering DO   On: 04/15/2020 16:15   ECHOCARDIOGRAM COMPLETE  Result Date: 04/16/2020    ECHOCARDIOGRAM REPORT   Patient Name:   BRIGGETT TUCCILLO Date of Exam: 04/16/2020 Medical Rec #:  [id number]        Height:       60.0 in Accession #:    [id number]       Weight:       112.4 lb Date of Birth:  01/01/1930         BSA:          1.461 m Patient Age:    14 years         BP:           105/73 mmHg Patient Gender: F                HR:           93 bpm. Exam Location:  Inpatient Procedure: 2D Echo, Cardiac Doppler and Color Doppler Indications:    Atrial fibrillation  History:        Patient has no prior history of Echocardiogram examinations.                 CHF, COPD; Risk Factors:Hypertension and Dyslipidemia. CKD.  Sonographer:    Clayton Lefort RDCS (AE) Referring Phys: [id number] Michell Heinrich [facility] IMPRESSIONS  1. Left ventricular ejection fraction, by estimation, is 50 to 55%. The left ventricle has low normal function. The left ventricle has no regional wall  motion abnormalities. There is mild concentric left ventricular hypertrophy. Left ventricular diastolic parameters are indeterminate.  2. Right ventricular systolic function is mildly reduced. The right ventricular size is normal. There is moderately elevated pulmonary artery systolic pressure.  3. Left atrial size was severely dilated.  4. Right atrial size was severely dilated.  5. The mitral valve is normal in structure. Mild mitral valve regurgitation. No evidence of mitral stenosis.  6. Tricuspid valve regurgitation is moderate to severe.  7. The aortic valve is normal in structure. Aortic valve regurgitation is not visualized. No aortic stenosis is present.  8. The inferior vena cava is normal in size with greater than 50% respiratory variability, suggesting right atrial pressure of 3 mmHg. FINDINGS  Left Ventricle: Left ventricular ejection fraction, by estimation, is 50 to 55%. The left ventricle has low normal function. The left ventricle has no regional wall motion abnormalities. The left ventricular internal cavity size was normal in size. There is mild concentric left ventricular hypertrophy. Left ventricular diastolic parameters are indeterminate. Right Ventricle: The right ventricular size is normal. No increase in right ventricular wall thickness. Right ventricular systolic function is mildly reduced. There is moderately elevated pulmonary artery systolic pressure. The tricuspid regurgitant velocity is 3.30 m/s, and with an assumed right atrial pressure of 3 mmHg, the estimated right ventricular systolic pressure is 35.3 mmHg. Left Atrium: Left atrial  size was severely dilated. Right Atrium: Right atrial size was severely dilated. Pericardium: There is no evidence of pericardial effusion. Mitral Valve: The mitral valve is normal in structure. Normal mobility of the mitral valve leaflets. Mild mitral annular calcification. Mild mitral valve regurgitation. No evidence of mitral valve stenosis. Tricuspid  Valve: The tricuspid valve is normal in structure. Tricuspid valve regurgitation is moderate to severe. No evidence of tricuspid stenosis. Aortic Valve: The aortic valve is normal in structure.. There is moderate thickening and moderate calcification of the aortic valve. Aortic valve regurgitation is not visualized. No aortic stenosis is present. There is moderate thickening of the aortic valve. There is moderate calcification of the aortic valve. Aortic valve mean gradient measures 4.0 mmHg. Aortic valve peak gradient measures 7.2 mmHg. Aortic valve area, by VTI measures 1.20 cm. Pulmonic Valve: The pulmonic valve was normal in structure. Pulmonic valve regurgitation is not visualized. No evidence of pulmonic stenosis. Aorta: The aortic root is normal in size and structure. Venous: The inferior vena cava is normal in size with greater than 50% respiratory variability, suggesting right atrial pressure of 3 mmHg. IAS/Shunts: No atrial level shunt detected by color flow Doppler.  LEFT VENTRICLE PLAX 2D LVIDd:         3.80 cm LVIDs:         2.80 cm LV PW:         1.20 cm LV IVS:        1.37 cm LVOT diam:     1.80 cm LV SV:         32 LV SV Index:   22 LVOT Area:     2.54 cm  RIGHT VENTRICLE            IVC RV Basal diam:  3.60 cm    IVC diam: 1.00 cm RV Mid diam:    2.90 cm RV S prime:     5.43 cm/s TAPSE (M-mode): 0.9 cm LEFT ATRIUM             Index       RIGHT ATRIUM           Index LA diam:        3.80 cm 2.60 cm/m  RA Area:     24.10 cm LA Vol (A2C):   62.6 ml 42.84 ml/m RA Volume:   67.90 ml  46.46 ml/m LA Vol (A4C):   50.0 ml 34.22 ml/m LA Biplane Vol: 57.6 ml 39.42 ml/m  AORTIC VALVE AV Area (Vmax):    1.43 cm AV Area (Vmean):   1.22 cm AV Area (VTI):     1.20 cm AV Vmax:           134.00 cm/s AV Vmean:          97.300 cm/s AV VTI:            0.269 m AV Peak Grad:      7.2 mmHg AV Mean Grad:      4.0 mmHg LVOT Vmax:         75.10 cm/s LVOT Vmean:        46.500 cm/s LVOT VTI:          0.127 m LVOT/AV  VTI ratio: 0.47  AORTA Ao Root diam: 3.10 cm Ao Asc diam:  3.00 cm TRICUSPID VALVE TR Peak grad:   43.6 mmHg TR Vmax:        330.00 cm/s  SHUNTS Systemic VTI:  0.13 m Systemic Diam: 1.80 cm Tiffany  Oval Linsey MD Electronically signed by Skeet Latch MD Signature Date/Time: 04/16/2020/1:58:37 PM    Final    Scheduled Meds: . apixaban  2.5 mg Oral BID  . atorvastatin  20 mg Oral Daily  . digoxin  0.125 mg Oral Daily  . furosemide  40 mg Intravenous Q12H  . sodium chloride flush  3 mL Intravenous Q12H   Continuous Infusions: . sodium chloride    . diltiazem (CARDIZEM) infusion 5 mg/hr (04/16/20 0440)    LOS: 1 day   Kerney Elbe, DO [facility] PAGER is on [place]  If 7PM-7AM, please contact night-coverage [email or web address]

## 2020-04-16 NOTE — Consult Note (Signed)
Cardiology Consultation:   Patient ID: Kelli Peterson; [id number]; 07/11/1930   Admit date: 04/15/2020 Date of Consult: 04/16/2020  Primary Care Provider: Jani Gravel, MD Primary Cardiologist: New to [facility]   Patient Profile:   Kelli Peterson is a 86 y.o. female with a hx of  AF on Eliquis, HTN, HLD, chronic diastolic CHF who is being seen today for the evaluation of atrial fibrillation and CHF exacerbation at the request of Dr. Doristine Peterson.  History of Present Illness:   Kelli Peterson is a 86yo F with a hx as stated above who presented to [facility] 04/15/20 with palpitations and SOB for the last three weeks. Pt daughter at bedside who provides most of the recent hx. Daughter states that she was in her usual state of health until three weeks ago when she began having increased SOB with exertion, LE edema and orthopnea symptoms. She states that the patient was seen by her PCP at which time her home dose Diltiazem was increased. After two weeks she was seen in follow up with no real improvement in HR or symptoms. She was then placed on Digoxin, as she was previously maintained on this for many years however it was stopped due to chronic use and worry for toxicity. Daughter states that home O2 saturations were noted to be in the 70-80's, not on supplemental oxygen support. Given persistent symptoms, she proceeded to the ED for further evaluation.   Daughter reports a long hx of AF for which her PCP has been managing. She has been well controlled for many years. She was previously anticoagulated with Coumadin until February of this year at which time she was transitioned to low dose Eliquis due to irregular INR results. On my exam, the patient denies chest pain. She reports improvement in her symptoms since admission. LE edema has improved. Lungs remain diminished with crackles in lower lobes.   On ED arrival, HR noted to be in the 130's. She required 3L [place]. BNP was 528. CXR with interstitial prominence  throughout both lungs, increased as compared to the prior exam.  Findings are nonspecific but may reflect interstitial edema or atypical/viral pneumonia.  Unchanged cardiomegaly. She was given metoprolol 5mg  IV along with IV Lasix 20mg . Given her CXR, she was started on empiric Abx.   Past Medical History:  Diagnosis Date  . Adenomatous colon polyp 2007  . Atrial fibrillation ([place])   . Barrett's esophagus 2007  . 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 Cervical cancer ([place])   . CHF (congestive heart failure) ([place])   . Closed pelvic fracture ([place])   . CVD (cardiovascular disease)   . DVT (deep venous thrombosis) ([place])   . Fibrocystic breast disease   . Fracture of pubic ramus ([place])   . Hyperlipidemia   . Hypertension   . Iron deficiency anemia     Past Surgical History:  Procedure Laterality Date  . CATARACT EXTRACTION Right 02/15/2010   [place]  . CATARACT EXTRACTION Left 01/30/2010   [facility]  . CATARACT EXTRACTION, BILATERAL  2010  . TOTAL ABDOMINAL HYSTERECTOMY  1964     Prior to Admission medications   Medication Sig Start Date End Date Taking? Authorizing Provider  acetaminophen (TYLENOL) 500 MG tablet Take 500 mg by mouth every 6 (six) hours as needed for pain.   Yes [provider]  amLODipine (NORVASC) 5 MG tablet Take 5 mg by mouth daily.   Yes [provider]  apixaban (ELIQUIS) 2.5 MG TABS tablet Take 2.5  mg by mouth 2 (two) times daily.   Yes [provider]  atorvastatin (LIPITOR) 20 MG tablet Take 20 mg by mouth daily.   Yes [provider]  brimonidine (ALPHAGAN) 0.2 % ophthalmic solution Place 1 drop into the right eye 2 (two) times daily. 05/01/18  Yes [provider]  denosumab (PROLIA) 60 MG/ML SOSY injection Inject 60 mg into the skin every 6 (six) months.   Yes [provider]  digoxin (DIGOX) 0.25 MG tablet Take 0.125 mg by mouth daily.   Yes [provider]  diltiazem (TIAZAC) 180 MG 24 hr capsule Take 1  capsule (180 mg total) by mouth daily. Patient taking differently: Take 180 mg by mouth See admin instructions. Take 180 mg by mouth in the morning and in the evening 06/04/18  Yes Adhikari, Amrit, MD  dorzolamide (TRUSOPT) 2 % ophthalmic solution Place 1 drop into both eyes 2 (two) times daily.    Yes [provider]  guaiFENesin (MUCINEX) 600 MG 12 hr tablet Take 600 mg by mouth 2 (two) times daily as needed for to loosen phlegm.   Yes [provider]  hydrALAZINE (APRESOLINE) 25 MG tablet Take 1 tablet (25 mg total) by mouth 2 (two) times daily. Patient taking differently: Take 25 mg by mouth in the morning.  06/04/18  Yes Adhikari, Tamsen Meek, MD  latanoprost (XALATAN) 0.005 % ophthalmic solution Place 1 drop into both eyes daily at 12 noon.    Yes [provider]  Multiple Vitamin (MULTIVITAMIN WITH MINERALS) TABS tablet Take 1 tablet by mouth daily.   Yes [provider]  Netarsudil Dimesylate (RHOPRESSA) 0.02 % SOLN Place 1 drop into the right eye daily.    Yes [provider]  alendronate (FOSAMAX) 70 MG tablet Take 70 mg by mouth every Thursday. Take with a full glass of water on an empty stomach. Patient not taking: Reported on 04/15/2020    [provider]  dorzolamide-timolol (COSOPT) 22.3-6.8 MG/ML ophthalmic solution Place 1 drop into both eyes 2 (two) times daily. Patient not taking: Reported on 04/15/2020    [provider]  ferrous sulfate 325 (65 FE) MG tablet Take 325 mg by mouth daily with breakfast. Patient not taking: Reported on 04/15/2020    [provider]  HYDROcodone-acetaminophen (NORCO/VICODIN) 5-325 MG tablet Take 2 tablets by mouth every 6 (six) hours as needed. Patient not taking: Reported on 04/15/2020 08/07/19   Virgel Manifold, MD  metoprolol succinate (TOPROL-XL) 25 MG 24 hr tablet Take 1 tablet (25 mg total) by mouth daily. Patient not taking: Reported on 04/15/2020 06/07/18   Shelly Coss, MD  oxybutynin  (DITROPAN) 5 MG tablet Take 5 mg by mouth 2 (two) times daily as needed for bladder spasms.  Patient not taking: Reported on 04/15/2020 05/01/18   [provider]  potassium chloride SA (K-DUR,KLOR-CON) 20 MEQ tablet Take 1 tablet (20 mEq total) by mouth daily for 14 days. Patient not taking: Reported on 04/15/2020 06/06/18 04/15/20  Shelly Coss, MD    Inpatient Medications: Scheduled Meds: . apixaban  2.5 mg Oral BID  . atorvastatin  20 mg Oral Daily  . digoxin  0.125 mg Oral Daily  . furosemide  40 mg Intravenous Q12H  . hydrALAZINE  25 mg Oral BID  . sodium chloride flush  3 mL Intravenous Q12H   Continuous Infusions: . sodium chloride    . diltiazem (CARDIZEM) infusion 5 mg/hr (04/16/20 0440)   PRN Meds: sodium chloride, acetaminophen, ipratropium-albuterol, ondansetron (ZOFRAN)  IV, sodium chloride flush  Allergies:    Allergies  Allergen Reactions  . Penicillins Swelling and Other (See Comments)    Ankles and feet swell  . Zocor [Simvastatin] Other (See Comments)    Weak and dizzy  . Nsaids Other (See Comments)    Tylenol is all that is permitted    Social History:   Social History   Socioeconomic History  . Marital status: Widowed    Spouse name: Not on file  . Number of children: Not on file  . Years of education: Not on file  . Highest education level: Not on file  Occupational History  . Occupation: Retired  Tobacco Use  . Smoking status: Never Smoker  . Smokeless tobacco: Never Used  Substance and Sexual Activity  . Alcohol use: No  . Drug use: No  . Sexual activity: Not on file  Other Topics Concern  . Not on file  Social History Narrative  . Not on file   Social Determinants of Health   Financial Resource Strain:   . Difficulty of Paying Living Expenses:   Food Insecurity:   . Worried About Charity fundraiser in the Last Year:   . Arboriculturist in the Last Year:   Transportation Needs:   . Film/video editor (Medical):   Marland Kitchen  Lack of Transportation (Non-Medical):   Physical Activity:   . Days of Exercise per Week:   . Minutes of Exercise per Session:   Stress:   . Feeling of Stress :   Social Connections:   . Frequency of Communication with Friends and Family:   . Frequency of Social Gatherings with Friends and Family:   . Attends Religious Services:   . Active Member of Clubs or Organizations:   . Attends Archivist Meetings:   Marland Kitchen Marital Status:   Intimate Partner Violence:   . Fear of Current or Ex-Partner:   . Emotionally Abused:   Marland Kitchen Physically Abused:   . Sexually Abused:     Family History:   Family History  Problem Relation Age of Onset  . CVA Father   . CVA Mother    Family Status:  Family Status  Relation Name Status  . Father  Deceased  . Mother  Deceased  . Son  Deceased    ROS:  Please see the history of present illness.  All other ROS reviewed and negative.     Physical Exam/Data:   Vitals:   04/16/20 1145 04/16/20 1149 04/16/20 1154 04/16/20 1155  BP:   99/65   Pulse: 81 (!) 105 (!) 103 97  Resp: 20 15 13 12   Temp:      TempSrc:      SpO2: 98% 97% 98% 97%    Intake/Output Summary (Last 24 hours) at 04/16/2020 1211 Last data filed at 04/16/2020 0645 Gross per 24 hour  Intake --  Output 800 ml  Net -800 ml   There were no vitals filed for this visit. There is no height or weight on file to calculate BMI.   General: Frail, elderly, NAD Neck: Negative for carotid bruits. No JVD Lungs: Diminished in bilateral bases with rhonchi in lower lobes. Breathing is unlabored. Cardiovascular: Irregularly irregular with S1 S2. No murmurs Abdomen: Soft, non-tender, non-distended. No obvious abdominal masses. Extremities: No edema. Radial pulses 2+ bilaterally Neuro: Alert and oriented. No focal deficits. No facial asymmetry. MAE spontaneously. Psych: Responds to questions appropriately with normal affect.    EKG:  The EKG was personally reviewed and demonstrates:  04/16/20 AF with HR 145bpm  Telemetry:  Telemetry was personally reviewed and demonstrates:  04/16/20 AF with HR 100's   Relevant CV Studies:  ECHO: Pending   Laboratory Data:  Chemistry Recent Labs  Lab 04/15/20 1622 04/16/20 0330  NA 139 139  K 4.2 4.0  CL 106 104  CO2 22 21*  GLUCOSE 108* 95  BUN 15 14  CREATININE 1.36* 1.19*  CALCIUM 8.6* 8.2*  GFRNONAA 35* 41*  GFRAA 40* 48*  ANIONGAP 11 14    Total Protein  Date Value Ref Range Status  04/15/2020 7.2 6.5 - 8.1 g/dL Final   Albumin  Date Value Ref Range Status  04/15/2020 2.5 (L) 3.5 - 5.0 g/dL Final   AST  Date Value Ref Range Status  04/15/2020 22 15 - 41 U/L Final   ALT  Date Value Ref Range Status  04/15/2020 18 0 - 44 U/L Final   Alkaline Phosphatase  Date Value Ref Range Status  04/15/2020 70 38 - 126 U/L Final   Total Bilirubin  Date Value Ref Range Status  04/15/2020 0.6 0.3 - 1.2 mg/dL Final   Hematology Recent Labs  Lab 04/15/20 1541  WBC 9.0  RBC 3.87  HGB 10.1*  HCT 32.5*  MCV 84.0  MCH 26.1  MCHC 31.1  RDW 19.3*  PLT 374   Cardiac EnzymesNo results for input(s): TROPONINI in the last 168 hours. No results for input(s): TROPIPOC in the last 168 hours.  BNP Recent Labs  Lab 04/15/20 1541  BNP 528.5*    DDimer No results for input(s): DDIMER in the last 168 hours. TSH:  Lab Results  Component Value Date   TSH 1.144 04/15/2020   Lipids: Lab Results  Component Value Date   CHOL 106 02/20/2016   HDL 31 (A) 02/20/2016   LDLCALC 56 02/20/2016   TRIG 92 02/20/2016   HgbA1c:No results found for: HGBA1C  Radiology/Studies:  DG Chest Port 1 View  Result Date: 04/15/2020 CLINICAL DATA:  Shortness of breath. EXAM: PORTABLE CHEST 1 VIEW COMPARISON:  Prior chest radiograph 03/31/2020 and earlier FINDINGS: Unchanged cardiomegaly. Aortic atherosclerosis. There are interstitial opacities throughout both lungs, increased in conspicuity as compared to chest radiograph 03/31/2020.  Additionally, there is left basilar opacity with silhouetting of the left hemidiaphragm which may reflect atelectasis or consolidation. Additionally, a small pleural effusion may be present. No evidence of pneumothorax. No acute bony abnormality identified. Chronic multilevel thoracic compression deformities. IMPRESSION: Interstitial prominence throughout both lungs, increased as compared to the prior exam of 03/31/2020. Findings are nonspecific, but may reflect interstitial edema or atypical/viral pneumonia. Opacity at the left lung base, consistent with atelectasis or consolidation. Unchanged cardiomegaly. Aortic Atherosclerosis (ICD10-I70.0). Electronically Signed   By: Kellie Simmering DO   On: 04/15/2020 16:15   Assessment and Plan:   1. Persistent atrial fibrillation with RVR: -Pt has long hx of AF which is managed by her PCP with PTA Toprol 25mg , Tiazac 180mg , Digoxin 0.125mg  and anticoagulated with low dose Eliquis (complicant) who presented with a 3-week hx of HF symptoms including SOB, LE edema and orthopnea found to be in AF with RVR on hospital presentation with HRs in the 140's.  -Given IV Metoprolol and started on IV Diltiazem with better rate control>>HR's now in the 100's  -On PTA Toprol 25, Tiazac 180 and newly added Digoxin 0.125 -Continue low dose Eliquis  -BP's soft today with daughter reporting home SBPs in the 120 range.  -  Would hold Hydralazine and amlodipine to allow for greater BP -Continue Diltiazem for now and up titrate as BP allows. Once HRs optimal, would transition to PO -Could also consider Amiodarone however may not be optimal long term -No place for DCCV at this time given chronicity of AF  2. Acute hypoxic Respiratory failure secondary to acute on chronic diastolic HF: -SOB felt to be in the setting of CHF versus PNA>>>started on empiric abx and Duo Nebs per primary team   -BNP elevated at 528 with CXR confirming fluid volume overload  -Pt started on IV Lasix 40mg   BID -Echocardiogram pending with no comparison in Epic -Weight, none documented -I&O, net negative 843ml  -O2 saturations stablized on 4L [place]  -Likely in the setting of fluid volume overload secondary to AF with RVR  3. HTN: -Soft, 99/65>120/89>105/73 -Continue Diltiazem -Hold PTA hydralazine and amlodipine to allow for greater BP   4. Acute on chronic kidney disease Stage II: -Admission creatinine 1.36 with repeat today at 1.19 -Improving with diuresis -Monitor with daily BMET   For questions or updates, please contact [place] Please consult [email or web address] for contact info under Cardiology/STEMI.   SignedKathyrn Drown NP-C [facility] Pager: [phone] 04/16/2020 12:11 PM

## 2020-04-17 ENCOUNTER — Other Ambulatory Visit: Payer: Self-pay

## 2020-04-17 ENCOUNTER — Inpatient Hospital Stay (HOSPITAL_COMMUNITY): Payer: Medicare Other

## 2020-04-17 DIAGNOSIS — I10 Essential (primary) hypertension: Secondary | ICD-10-CM | POA: Diagnosis not present

## 2020-04-17 DIAGNOSIS — I5031 Acute diastolic (congestive) heart failure: Secondary | ICD-10-CM | POA: Diagnosis not present

## 2020-04-17 DIAGNOSIS — D649 Anemia, unspecified: Secondary | ICD-10-CM

## 2020-04-17 DIAGNOSIS — N1831 Chronic kidney disease, stage 3a: Secondary | ICD-10-CM

## 2020-04-17 DIAGNOSIS — N1832 Chronic kidney disease, stage 3b: Secondary | ICD-10-CM

## 2020-04-17 DIAGNOSIS — I4891 Unspecified atrial fibrillation: Secondary | ICD-10-CM | POA: Diagnosis not present

## 2020-04-17 DIAGNOSIS — L8990 Pressure ulcer of unspecified site, unspecified stage: Secondary | ICD-10-CM | POA: Insufficient documentation

## 2020-04-17 LAB — CBC WITH DIFFERENTIAL/PLATELET
Abs Immature Granulocytes: 0.05 10*3/uL (ref 0.00–0.07)
Basophils Absolute: 0 10*3/uL (ref 0.0–0.1)
Basophils Relative: 0 %
Eosinophils Absolute: 0.2 10*3/uL (ref 0.0–0.5)
Eosinophils Relative: 2 %
HCT: 30.5 % — ABNORMAL LOW (ref 36.0–46.0)
Hemoglobin: 9.5 g/dL — ABNORMAL LOW (ref 12.0–15.0)
Immature Granulocytes: 1 %
Lymphocytes Relative: 6 %
Lymphs Abs: 0.6 10*3/uL — ABNORMAL LOW (ref 0.7–4.0)
MCH: 26 pg (ref 26.0–34.0)
MCHC: 31.1 g/dL (ref 30.0–36.0)
MCV: 83.3 fL (ref 80.0–100.0)
Monocytes Absolute: 0.6 10*3/uL (ref 0.1–1.0)
Monocytes Relative: 6 %
Neutro Abs: 8.6 10*3/uL — ABNORMAL HIGH (ref 1.7–7.7)
Neutrophils Relative %: 85 %
Platelets: 295 10*3/uL (ref 150–400)
RBC: 3.66 MIL/uL — ABNORMAL LOW (ref 3.87–5.11)
RDW: 17.9 % — ABNORMAL HIGH (ref 11.5–15.5)
WBC: 10 10*3/uL (ref 4.0–10.5)
nRBC: 0 % (ref 0.0–0.2)

## 2020-04-17 LAB — COMPREHENSIVE METABOLIC PANEL
ALT: 15 U/L (ref 0–44)
AST: 18 U/L (ref 15–41)
Albumin: 2.3 g/dL — ABNORMAL LOW (ref 3.5–5.0)
Alkaline Phosphatase: 57 U/L (ref 38–126)
Anion gap: 10 (ref 5–15)
BUN: 18 mg/dL (ref 8–23)
CO2: 26 mmol/L (ref 22–32)
Calcium: 8.1 mg/dL — ABNORMAL LOW (ref 8.9–10.3)
Chloride: 103 mmol/L (ref 98–111)
Creatinine, Ser: 1.43 mg/dL — ABNORMAL HIGH (ref 0.44–1.00)
GFR calc Af Amer: 38 mL/min — ABNORMAL LOW (ref >60)
GFR calc non Af Amer: 33 mL/min — ABNORMAL LOW (ref >60)
Glucose, Bld: 91 mg/dL (ref 70–99)
Potassium: 3.5 mmol/L (ref 3.5–5.1)
Sodium: 139 mmol/L (ref 135–145)
Total Bilirubin: 0.5 mg/dL (ref 0.3–1.2)
Total Protein: 6.3 g/dL — ABNORMAL LOW (ref 6.5–8.1)

## 2020-04-17 LAB — VITAMIN B12: Vitamin B-12: 470 pg/mL (ref 180–914)

## 2020-04-17 LAB — IRON AND TIBC
Iron: 20 ug/dL — ABNORMAL LOW (ref 28–170)
Saturation Ratios: 8 % — ABNORMAL LOW (ref 10.4–31.8)
TIBC: 259 ug/dL (ref 250–450)
UIBC: 239 ug/dL

## 2020-04-17 LAB — RETICULOCYTES
Immature Retic Fract: 15.2 % (ref 2.3–15.9)
RBC.: 3.65 MIL/uL — ABNORMAL LOW (ref 3.87–5.11)
Retic Count, Absolute: 69 10*3/uL (ref 19.0–186.0)
Retic Ct Pct: 1.9 % (ref 0.4–3.1)

## 2020-04-17 LAB — PROCALCITONIN: Procalcitonin: 0.51 ng/mL

## 2020-04-17 LAB — MAGNESIUM: Magnesium: 1.8 mg/dL (ref 1.7–2.4)

## 2020-04-17 LAB — FERRITIN: Ferritin: 62 ng/mL (ref 11–307)

## 2020-04-17 LAB — PHOSPHORUS: Phosphorus: 3.5 mg/dL (ref 2.5–4.6)

## 2020-04-17 LAB — FOLATE: Folate: 26.5 ng/mL (ref >5.9)

## 2020-04-17 MED ORDER — ORAL CARE MOUTH RINSE
15.0000 mL | Freq: Two times a day (BID) | OROMUCOSAL | Status: DC
  Administered 2020-04-17 – 2020-04-24 (×14): 15 mL via OROMUCOSAL

## 2020-04-17 MED ORDER — NETARSUDIL DIMESYLATE 0.02 % OP SOLN
1.0000 [drp] | Freq: Every day | OPHTHALMIC | Status: DC
  Administered 2020-04-18 – 2020-04-24 (×5): 1 [drp] via OPHTHALMIC

## 2020-04-17 MED ORDER — ENSURE ENLIVE PO LIQD
237.0000 mL | Freq: Two times a day (BID) | ORAL | Status: DC
  Administered 2020-04-17: 237 mL via ORAL

## 2020-04-17 MED ORDER — POTASSIUM CITRATE ER 10 MEQ (1080 MG) PO TBCR
30.0000 meq | EXTENDED_RELEASE_TABLET | Freq: Once | ORAL | Status: AC
  Administered 2020-04-17: 30 meq via ORAL
  Filled 2020-04-17: qty 3

## 2020-04-17 MED ORDER — METOPROLOL SUCCINATE ER 25 MG PO TB24
25.0000 mg | ORAL_TABLET | Freq: Every day | ORAL | Status: DC
  Administered 2020-04-17 – 2020-04-18 (×2): 25 mg via ORAL
  Filled 2020-04-17 (×2): qty 1

## 2020-04-17 MED ORDER — LATANOPROST 0.005 % OP SOLN
1.0000 [drp] | Freq: Every day | OPHTHALMIC | Status: DC
  Administered 2020-04-17 – 2020-04-24 (×8): 1 [drp] via OPHTHALMIC
  Filled 2020-04-17: qty 2.5

## 2020-04-17 MED ORDER — BRIMONIDINE TARTRATE 0.2 % OP SOLN
1.0000 [drp] | Freq: Two times a day (BID) | OPHTHALMIC | Status: DC
  Administered 2020-04-17 – 2020-04-24 (×15): 1 [drp] via OPHTHALMIC
  Filled 2020-04-17: qty 5

## 2020-04-17 MED ORDER — ENSURE ENLIVE PO LIQD
237.0000 mL | Freq: Two times a day (BID) | ORAL | Status: DC
  Administered 2020-04-17 – 2020-04-24 (×10): 237 mL via ORAL

## 2020-04-17 MED ORDER — ADULT MULTIVITAMIN W/MINERALS CH
1.0000 | ORAL_TABLET | Freq: Every day | ORAL | Status: DC
  Administered 2020-04-17 – 2020-04-24 (×8): 1 via ORAL
  Filled 2020-04-17 (×8): qty 1

## 2020-04-17 NOTE — Progress Notes (Signed)
   04/17/20 0114  Assess: MEWS Score  ECG Heart Rate (!) 121  Assess: MEWS Score  MEWS Temp 0  MEWS Systolic 0  MEWS Pulse 2  MEWS RR 0  MEWS LOC 0  MEWS Score 2  MEWS Score Color Yellow  Assess: if the MEWS score is Yellow or Red  Were vital signs taken at a resting state? Yes  Focused Assessment Documented focused assessment  Early Detection of Sepsis Score *See Row Information* High  MEWS guidelines implemented *See Row Information* Yes  Treat  MEWS Interventions Other (Comment) (Cardizem titration(s) )  Take Vital Signs  Increase Vital Sign Frequency  Yellow: Q 2hr X 2 then Q 4hr X 2, if remains yellow, continue Q 4hrs  Escalate  MEWS: Escalate Yellow: discuss with charge nurse/RN and consider discussing with provider and RRT  Notify: Charge Nurse/RN  Name of Charge Nurse/RN Notified Shanell, RN   Date Charge Nurse/RN Notified 04/17/20  Time Charge Nurse/RN Notified 0114  Document  Patient Outcome Other (Comment) (arrived on unit with yellow MEWS, continue plan of care)

## 2020-04-17 NOTE — Progress Notes (Addendum)
Progress Note  Patient Name: Kelli Peterson Date of Encounter: 04/17/2020  Primary Cardiologist: No primary care provider on file.   Subjective   No chest pain or SOB  Inpatient Medications    Scheduled Meds: . apixaban  2.5 mg Oral BID  . atorvastatin  20 mg Oral Daily  . feeding supplement (ENSURE ENLIVE)  237 mL Oral BID BM  . furosemide  40 mg Intravenous Q12H  . mouth rinse  15 mL Mouth Rinse BID  . sodium chloride flush  3 mL Intravenous Q12H   Continuous Infusions: . sodium chloride    . diltiazem (CARDIZEM) infusion 12.5 mg/hr (04/17/20 0713)   PRN Meds: sodium chloride, acetaminophen, ipratropium-albuterol, ondansetron (ZOFRAN) IV, sodium chloride flush   Vital Signs    Vitals:   04/17/20 0439 04/17/20 0516 04/17/20 0717 04/17/20 0727  BP: 111/69 113/61 126/71 127/75  Pulse: (!) 119 (!) 112 (!) 120 (!) 123  Resp: 16 19 20    Temp: 97.7 F (36.5 C) (!) 97.5 F (36.4 C)  (!) 97.4 F (36.3 C)  TempSrc: Oral Oral  Oral  SpO2: 96% 98% 98%   Weight:      Height:        Intake/Output Summary (Last 24 hours) at 04/17/2020 0847 Last data filed at 04/17/2020 0500 Gross per 24 hour  Intake 185.68 ml  Output 1000 ml  Net -814.32 ml   Filed Weights   04/17/20 0118  Weight: 45.4 kg    Telemetry    NSR - Personally Reviewed  ECG    No new EKG to rreview - Personally Reviewed  Physical Exam   GEN: No acute distress.   Neck: No JVD Cardiac: irregularly lirregular, no murmurs, rubs, or gallops.  Respiratory: Clear to auscultation bilaterally. GI: Soft, nontender, non-distended  MS: No edema; No deformity. Neuro:  Nonfocal  Psych: Normal affect   Labs    Chemistry Recent Labs  Lab 04/15/20 1622 04/16/20 0330  NA 139 139  K 4.2 4.0  CL 106 104  CO2 22 21*  GLUCOSE 108* 95  BUN 15 14  CREATININE 1.36* 1.19*  CALCIUM 8.6* 8.2*  PROT 7.2  --   ALBUMIN 2.5*  --   AST 22  --   ALT 18  --   ALKPHOS 70  --   BILITOT 0.6  --   GFRNONAA  35* 41*  GFRAA 40* 48*  ANIONGAP 11 14     Hematology Recent Labs  Lab 04/15/20 1541  WBC 9.0  RBC 3.87  HGB 10.1*  HCT 32.5*  MCV 84.0  MCH 26.1  MCHC 31.1  RDW 19.3*  PLT 374    Cardiac EnzymesNo results for input(s): TROPONINI in the last 168 hours. No results for input(s): TROPIPOC in the last 168 hours.   BNP Recent Labs  Lab 04/15/20 1541  BNP 528.5*     DDimer No results for input(s): DDIMER in the last 168 hours.   Radiology    DG CHEST PORT 1 VIEW  Result Date: 04/17/2020 CLINICAL DATA:  Acute hypoxic respiratory failure EXAM: PORTABLE CHEST 1 VIEW COMPARISON:  Two days ago FINDINGS: Interstitial coarsening which is essentially stable from most recent prior and accentuated compared to more remote priors. Cardiomegaly and vascular pedicle widening. Extensive atherosclerotic calcification. Probable small left pleural effusion. No pneumothorax. Midthoracic compression fractures. IMPRESSION: Stable cardiomegaly and interstitial opacity that could be inflammatory or congestive. Electronically Signed   By: Monte Fantasia M.D.   On: 04/17/2020  07:11   DG Chest Port 1 View  Result Date: 04/15/2020 CLINICAL DATA:  Shortness of breath. EXAM: PORTABLE CHEST 1 VIEW COMPARISON:  Prior chest radiograph 03/31/2020 and earlier FINDINGS: Unchanged cardiomegaly. Aortic atherosclerosis. There are interstitial opacities throughout both lungs, increased in conspicuity as compared to chest radiograph 03/31/2020. Additionally, there is left basilar opacity with silhouetting of the left hemidiaphragm which may reflect atelectasis or consolidation. Additionally, a small pleural effusion may be present. No evidence of pneumothorax. No acute bony abnormality identified. Chronic multilevel thoracic compression deformities. IMPRESSION: Interstitial prominence throughout both lungs, increased as compared to the prior exam of 03/31/2020. Findings are nonspecific, but may reflect interstitial edema  or atypical/viral pneumonia. Opacity at the left lung base, consistent with atelectasis or consolidation. Unchanged cardiomegaly. Aortic Atherosclerosis (ICD10-I70.0). Electronically Signed   By: Kellie Simmering DO   On: 04/15/2020 16:15   ECHOCARDIOGRAM COMPLETE  Result Date: 04/16/2020    ECHOCARDIOGRAM REPORT   Patient Name:   Kelli Peterson Date of Exam: 04/16/2020 Medical Rec #:  [id number]        Height:       60.0 in Accession #:    [id number]       Weight:       112.4 lb Date of Birth:  08-Dec-1929         BSA:          1.461 m Patient Age:    86 years         BP:           105/73 mmHg Patient Gender: F                HR:           93 bpm. Exam Location:  Inpatient Procedure: 2D Echo, Cardiac Doppler and Color Doppler Indications:    Atrial fibrillation  History:        Patient has no prior history of Echocardiogram examinations.                 CHF, COPD; Risk Factors:Hypertension and Dyslipidemia. CKD.  Sonographer:    Clayton Lefort RDCS (AE) Referring Phys: [id number] Michell Heinrich [facility] IMPRESSIONS  1. Left ventricular ejection fraction, by estimation, is 50 to 55%. The left ventricle has low normal function. The left ventricle has no regional wall motion abnormalities. There is mild concentric left ventricular hypertrophy. Left ventricular diastolic parameters are indeterminate.  2. Right ventricular systolic function is mildly reduced. The right ventricular size is normal. There is moderately elevated pulmonary artery systolic pressure.  3. Left atrial size was severely dilated.  4. Right atrial size was severely dilated.  5. The mitral valve is normal in structure. Mild mitral valve regurgitation. No evidence of mitral stenosis.  6. Tricuspid valve regurgitation is moderate to severe.  7. The aortic valve is normal in structure. Aortic valve regurgitation is not visualized. No aortic stenosis is present.  8. The inferior vena cava is normal in size with greater than 50% respiratory variability, suggesting right  atrial pressure of 3 mmHg. FINDINGS  Left Ventricle: Left ventricular ejection fraction, by estimation, is 50 to 55%. The left ventricle has low normal function. The left ventricle has no regional wall motion abnormalities. The left ventricular internal cavity size was normal in size. There is mild concentric left ventricular hypertrophy. Left ventricular diastolic parameters are indeterminate. Right Ventricle: The right ventricular size is normal. No increase in right ventricular wall thickness. Right ventricular systolic function is mildly  reduced. There is moderately elevated pulmonary artery systolic pressure. The tricuspid regurgitant velocity is 3.30 m/s, and with an assumed right atrial pressure of 3 mmHg, the estimated right ventricular systolic pressure is 63.8 mmHg. Left Atrium: Left atrial size was severely dilated. Right Atrium: Right atrial size was severely dilated. Pericardium: There is no evidence of pericardial effusion. Mitral Valve: The mitral valve is normal in structure. Normal mobility of the mitral valve leaflets. Mild mitral annular calcification. Mild mitral valve regurgitation. No evidence of mitral valve stenosis. Tricuspid Valve: The tricuspid valve is normal in structure. Tricuspid valve regurgitation is moderate to severe. No evidence of tricuspid stenosis. Aortic Valve: The aortic valve is normal in structure.. There is moderate thickening and moderate calcification of the aortic valve. Aortic valve regurgitation is not visualized. No aortic stenosis is present. There is moderate thickening of the aortic valve. There is moderate calcification of the aortic valve. Aortic valve mean gradient measures 4.0 mmHg. Aortic valve peak gradient measures 7.2 mmHg. Aortic valve area, by VTI measures 1.20 cm. Pulmonic Valve: The pulmonic valve was normal in structure. Pulmonic valve regurgitation is not visualized. No evidence of pulmonic stenosis. Aorta: The aortic root is normal in size and  structure. Venous: The inferior vena cava is normal in size with greater than 50% respiratory variability, suggesting right atrial pressure of 3 mmHg. IAS/Shunts: No atrial level shunt detected by color flow Doppler.  LEFT VENTRICLE PLAX 2D LVIDd:         3.80 cm LVIDs:         2.80 cm LV PW:         1.20 cm LV IVS:        1.37 cm LVOT diam:     1.80 cm LV SV:         32 LV SV Index:   22 LVOT Area:     2.54 cm  RIGHT VENTRICLE            IVC RV Basal diam:  3.60 cm    IVC diam: 1.00 cm RV Mid diam:    2.90 cm RV S prime:     5.43 cm/s TAPSE (M-mode): 0.9 cm LEFT ATRIUM             Index       RIGHT ATRIUM           Index LA diam:        3.80 cm 2.60 cm/m  RA Area:     24.10 cm LA Vol (A2C):   62.6 ml 42.84 ml/m RA Volume:   67.90 ml  46.46 ml/m LA Vol (A4C):   50.0 ml 34.22 ml/m LA Biplane Vol: 57.6 ml 39.42 ml/m  AORTIC VALVE AV Area (Vmax):    1.43 cm AV Area (Vmean):   1.22 cm AV Area (VTI):     1.20 cm AV Vmax:           134.00 cm/s AV Vmean:          97.300 cm/s AV VTI:            0.269 m AV Peak Grad:      7.2 mmHg AV Mean Grad:      4.0 mmHg LVOT Vmax:         75.10 cm/s LVOT Vmean:        46.500 cm/s LVOT VTI:          0.127 m LVOT/AV VTI ratio: 0.47  AORTA Ao Root diam: 3.10 cm  Ao Asc diam:  3.00 cm TRICUSPID VALVE TR Peak grad:   43.6 mmHg TR Vmax:        330.00 cm/s  SHUNTS Systemic VTI:  0.13 m Systemic Diam: 1.80 cm Skeet Latch MD Electronically signed by Skeet Latch MD Signature Date/Time: 04/16/2020/1:58:37 PM    Final     Cardiac Studies   2D echo  04/16/2020 IMPRESSIONS    1. Left ventricular ejection fraction, by estimation, is 50 to 55%. The  left ventricle has low normal function. The left ventricle has no regional  wall motion abnormalities. There is mild concentric left ventricular  hypertrophy. Left ventricular  diastolic parameters are indeterminate.  2. Right ventricular systolic function is mildly reduced. The right  ventricular size is normal. There is  moderately elevated pulmonary artery  systolic pressure.  3. Left atrial size was severely dilated.  4. Right atrial size was severely dilated.  5. The mitral valve is normal in structure. Mild mitral valve  regurgitation. No evidence of mitral stenosis.  6. Tricuspid valve regurgitation is moderate to severe.  7. The aortic valve is normal in structure. Aortic valve regurgitation is  not visualized. No aortic stenosis is present.  8. The inferior vena cava is normal in size with greater than 50%  respiratory variability, suggesting right atrial pressure of 3 mmHg.   Patient Profile     86 y.o. female with a hx of  AF on Eliquis, HTN, HLD, chronic diastolic CHF who is being seen today for the evaluation of atrial fibrillation and CHF exacerbation at the request of Dr. Doristine Bosworth.  Assessment & Plan    1.  Chronic atrial fibrillation now with RVR -she has had chronic afib managed with CCB and BB as well as Eliquis by PCP -TSH normal -Her HR is elevated today in the 120's despite IV Cardizem gtt. Titrate for HR control -Stopped digoxin due to advanced age.   -Stopped amlodipine since she is already on CCB  -Restart Toprol XL 25mg  daily and titrate as needed for HR control -Continue apixaban 2.5mg  BID (dosed for age>80, weight < 60kg).  -she has severe BAE on echo would likely not convert to NSR and maintain rhythm so will continue with rate control  2.  Acute on chronic diastolic CHF -2D echo this admit with EF 50-55% with mild LVH -She has some mild volume overload.   -she is currently on IV Lasix and put out 1L yesterday and is net neg 1.6L -Creatinine improved with diuresis from 1.36>1.19.  -BMET pending this am -continue Lasix 40mg  IV BID and follow strict I&O's, daily weights and renal function -likely exacerbated by elevated HR and needs good HR control and strict low Na diet  3.  Moderate pulmonary HTN -likely Group 2 related to pulmonary venous HTN from CHF -TR  moderate to severe and RVF mildly reduced -continue IV diuretics  4.  HTN -BP stable at 127/21mmHg -continue Cardizem gtt and Toprol as HR control needed -stopped amlodipine as she is already on a CCB -holding Hydralazine for now as we need more BP to allow for uptitration of BB and CCB for HR control  5.  AKI on CKD stage 2 -Creatinine improved from 1.36>1.19 with diuresis -follow daily BMET  For questions or updates, please contact [place] Please consult [email or web address] for contact info under Cardiology/STEMI.      Signed, Fransico Him, MD  04/17/2020, 8:47 AM

## 2020-04-17 NOTE — Progress Notes (Addendum)
Initial Nutrition Assessment  DOCUMENTATION CODES:   Severe malnutrition in context of chronic illness  INTERVENTION:    Vanilla or strawberry Ensure Enlive po BID, each supplement provides 350 kcal and 20 grams of protein.  Magic cup TID with meals, each supplement provides 290 kcal and 9 grams of protein.  MVI with minerals daily.  NUTRITION DIAGNOSIS:   Severe Malnutrition related to chronic illness (CHF) as evidenced by severe muscle depletion, severe fat depletion.  GOAL:   Patient will meet greater than or equal to 90% of their needs  MONITOR:   PO intake, Supplement acceptance  REASON FOR ASSESSMENT:   Malnutrition Screening Tool    ASSESSMENT:   86 yo female admitted with A fib and CHF exacerbation. PMH includes A fib, HTN, HLD, CHF, Barrett's esophagus, iron deficiency anemia.   Patient reports that she has lost weight, unable to quantify. She has been eating poorly due to poor appetite. She does not like chocolate Ensure, but is willing to drink vanilla or strawberry flavors. She likes sherbet, icees, and ice cream. Will also add Magic cup with meals to maximize intake of protein and calories. She denies any difficulty chewing or swallowing.   Labs reviewed.  Medications reviewed and include Lasix.  Usual weights reviewed. Most recent weight PTA is from 8 months ago, 51 kg. 12% weight loss within the past year is not significant for the time frame. Suspect edema is masking actual weight loss.   NUTRITION - FOCUSED PHYSICAL EXAM:    Most Recent Value  Orbital Region Severe depletion  Upper Arm Region Moderate depletion  Thoracic and Lumbar Region Moderate depletion  Buccal Region Severe depletion  Temple Region Severe depletion  Clavicle Bone Region Severe depletion  Clavicle and Acromion Bone Region Severe depletion  Scapular Bone Region Moderate depletion  Dorsal Hand Moderate depletion  Patellar Region Moderate depletion  Anterior Thigh Region  Moderate depletion  Posterior Calf Region Severe depletion  Edema (RD Assessment) Mild  Hair Reviewed  Eyes Reviewed  Mouth Reviewed  Skin Reviewed  Nails Reviewed       Diet Order:   Diet Order            Diet Heart Room service appropriate? Yes; Fluid consistency: Thin  Diet effective now                 EDUCATION NEEDS:   No education needs have been identified at this time  Skin:  Skin Assessment: Skin Integrity Issues: Skin Integrity Issues:: Stage II Stage II: L buttocks  Last BM:  7/13  Height:   Ht Readings from Last 1 Encounters:  04/17/20 5' (1.524 m)    Weight:   Wt Readings from Last 1 Encounters:  04/17/20 45.4 kg    Ideal Body Weight:  45.5 kg  BMI:  Body mass index is 19.55 kg/m.  Estimated Nutritional Needs:   Kcal:  1400-1600  Protein:  65-75 gm  Fluid:  >/= 1.4 L    Lucas Mallow, RD, LDN, CNSC Please refer to Amion for contact information.

## 2020-04-17 NOTE — Progress Notes (Signed)
OT Cancellation Note  Patient Details Name: Kelli Peterson MRN: [id number] DOB: 11/19/1929   Cancelled Treatment:    Reason Eval/Treat Not Completed: Fatigue/lethargy limiting ability to participate. Patient met lying supine in bed asleep. Patient easily awaken but declined participation with OT this date. OT will continue efforts toward completion of evaluation.    H. OTR/L Supplemental OT, Department of rehab services [phone]   R H. 04/17/2020, 2:10 PM 

## 2020-04-17 NOTE — NC FL2 (Signed)
[place] LEVEL OF CARE SCREENING TOOL     IDENTIFICATION  Patient Name: Kelli Peterson Birthdate: 17-Dec-1929 Sex: female Admission Date (Current Location): 04/15/2020  [facility] and [place] Number:  Herbalist and Address:  [facility]. [facility], [place] [address], [place], [place] [address]      Provider Number: [id number]  Attending Physician Name and Address:  Kerney Elbe, DO  Relative Name and Phone Number:  Idolina Primer [phone]    Current Level of Care: Hospital Recommended Level of Care: [place] Prior Approval Number:    Date Approved/Denied:   PASRR Number: [id number]  Discharge Plan: SNF    Current Diagnoses: Patient Active Problem List   Diagnosis Date Noted  . Pressure injury of skin 04/17/2020  . Atrial fibrillation with rapid ventricular response ([place])   . Acute on chronic diastolic (congestive) heart failure ([place])   . Pulmonary hypertension ([place])   . Acute kidney injury superimposed on chronic kidney disease ([place])   . CKD (chronic kidney disease), stage III 04/15/2020  . Acute hypoxemic respiratory failure ([place]) 04/15/2020  . Branch retinal vein occlusion with macular edema of left eye 01/23/2020  . Branch retinal vein occlusion of right eye 01/23/2020  . Exudative age-related macular degeneration of right eye with inactive choroidal neovascularization ([place]) 01/23/2020  . Intermediate stage nonexudative age-related macular degeneration of left eye 01/23/2020  . Protein-calorie malnutrition, severe 06/02/2018  . AKI (acute kidney injury) ([place]) 05/31/2018  . Dehydration 05/31/2018  . Essential hypertension 05/31/2018  . Atrial fibrillation, chronic ([place]) 05/31/2018  . COPD (chronic obstructive pulmonary disease) ([place]) 05/31/2018  . Glaucoma 05/31/2018  . Hyperlipidemia 05/31/2018  . Acute renal failure (ARF) ([place]) 05/31/2018  . Constipation 05/31/2018  . Acute right hip pain   . Atrial  fibrillation with RVR ([place])   . Pelvic fracture, closed, initial encounter 02/15/2016  . Fracture of pubic ramus ([place]) 02/15/2016    Orientation RESPIRATION BLADDER Height & Weight     Self, Time, Situation, Place  Normal Incontinent, External catheter (External Urinary Catheter) Weight: 100 lb 1.6 oz (45.4 kg) Height:  5' (152.4 cm)  BEHAVIORAL SYMPTOMS/MOOD NEUROLOGICAL BOWEL NUTRITION STATUS      Continent Diet (See Discharge Summary)  AMBULATORY STATUS COMMUNICATION OF NEEDS Skin   Limited Assist Verbally Other (Comment) (Appropriate for ethnicity, MASD moisture buttocks right left, pressure injury buttocks stage 2)                       Personal Care Assistance Level of Assistance  Bathing, Feeding, Dressing Bathing Assistance: Limited assistance Feeding assistance: Independent Dressing Assistance: Limited assistance     Functional Limitations Info  Sight, Hearing, Speech Sight Info: Impaired Hearing Info: Impaired Speech Info: Adequate    SPECIAL CARE FACTORS FREQUENCY  PT (By licensed PT), OT (By licensed OT)     PT Frequency: 5x min weekly OT Frequency: 5x min weekly            Contractures Contractures Info: Not present    Additional Factors Info  Code Status, Allergies Code Status Info: FULL Allergies Info: Penicillins,Zocor,Nsaids           Current Medications (04/17/2020):  This is the current hospital active medication list Current Facility-Administered Medications  Medication Dose Route Frequency Provider Last Rate Last Admin  . 0.9 %  sodium chloride infusion  250 mL Intravenous PRN Pahwani, Rinka R, MD      . acetaminophen (TYLENOL)  tablet 650 mg  650 mg Oral Q4H PRN Pahwani, Rinka R, MD      . apixaban (ELIQUIS) tablet 2.5 mg  2.5 mg Oral BID Pahwani, Rinka R, MD   2.5 mg at 04/17/20 0858  . atorvastatin (LIPITOR) tablet 20 mg  20 mg Oral Daily Pahwani, Rinka R, MD   20 mg at 04/17/20 0858  . brimonidine (ALPHAGAN) 0.2 % ophthalmic  solution 1 drop  1 drop Right Eye BID Raiford Noble [place], DO   1 drop at 04/17/20 1603  . diltiazem (CARDIZEM) 125 mg in dextrose 5% 125 mL (1 mg/mL) infusion  5-15 mg/hr Intravenous Titrated Pahwani, Rinka R, MD 12.5 mL/hr at 04/17/20 1013 12.5 mg/hr at 04/17/20 1013  . feeding supplement (ENSURE ENLIVE) (ENSURE ENLIVE) liquid 237 mL  237 mL Oral BID BM Sheikh, Omair Latif, DO   237 mL at 04/17/20 1604  . furosemide (LASIX) injection 40 mg  40 mg Intravenous Q12H Pahwani, Rinka R, MD   40 mg at 04/17/20 0858  . ipratropium-albuterol (DUONEB) 0.5-2.5 (3) MG/3ML nebulizer solution 3 mL  3 mL Nebulization Q4H PRN Pahwani, Rinka R, MD      . latanoprost (XALATAN) 0.005 % ophthalmic solution 1 drop  1 drop Both Eyes Q1200 Raiford Noble [place], DO   1 drop at 04/17/20 1604  . MEDLINE mouth rinse  15 mL Mouth Rinse BID Raiford Noble [place], DO   15 mL at 04/17/20 0902  . metoprolol succinate (TOPROL-XL) 24 hr tablet 25 mg  25 mg Oral Daily [name], MD   25 mg at 04/17/20 0910  . multivitamin with minerals tablet 1 tablet  1 tablet Oral Daily Raiford Noble [place], DO   1 tablet at 04/17/20 1640  . Netarsudil Dimesylate 0.02 % SOLN 1 drop  1 drop Right Eye Daily Sheikh, Omair Latif, DO      . ondansetron [facility]) injection 4 mg  4 mg Intravenous Q6H PRN Pahwani, Rinka R, MD      . sodium chloride flush (NS) 0.9 % injection 3 mL  3 mL Intravenous Q12H Pahwani, Rinka R, MD      . sodium chloride flush (NS) 0.9 % injection 3 mL  3 mL Intravenous PRN Pahwani, Rinka R, MD         Discharge Medications: Please see discharge summary for a list of discharge medications.  Relevant Imaging Results:  Relevant Lab Results:   Additional Information [phone]  Trula Ore, LCSWA

## 2020-04-17 NOTE — TOC Progression Note (Signed)
Transition of Care [facility]) - Progression Note    Patient Details  Name: Kelli Peterson MRN: [id number] Date of Birth: [birth year]  Transition of [facility]) CM/SW [place], [place] Phone Number: 04/17/2020, 5:55 PM  Clinical Narrative:     CSW started insurance authorization for patient. Start date is for 7/16. Reference number is # [id number].  Pending bed offers. Pending insurance authorization.  CSW will continue to follow.  Expected Discharge Plan: [place] Barriers to Discharge: Continued Medical Work up  Expected Discharge Plan and Services Expected Discharge Plan: [place] arrangements for the past 2 months: Single Family Home                                       Social Determinants of Health (SDOH) Interventions    Readmission Risk Interventions No flowsheet data found.

## 2020-04-17 NOTE — Progress Notes (Signed)
PROGRESS NOTE    Kelli Peterson  TKW:[id number] DOB: [birth year] DOA: 04/15/2020 PCP: Jani Gravel, MD  Brief Narrative:  HPI per Dr. Early Osmond on 04/15/20 Kelli Peterson is a 86 y.o. female with medical history significant of A. fib-on Eliquis, hypertension, hyperlipidemia, chronic diastolic CHF presents to emergency department with palpitation and shortness of breath since 3 weeks.  Patient's daughter at the bedside is the story and.  She tells me that patient has palpitation and shortness of breath since 3 weeks.  She was seen by her PCP and diltiazem dose was increased and and on follow-up appointment digoxin was added yesterday however due to persistent symptoms patient came to ER for further evaluation and management.  Patient's daughter reports leg swelling, weight gain, shortness of breath and decreased oxygen saturation in 70s and 80s.  Patient is not on home oxygen.  Patient cannot lie flat due to dizziness, she sleeps on a recliner.  She has chronic cough however denies wheezing, change in sputum color, fever, chills, nausea, vomiting, diarrhea, headache, blurry vision, chest pain, urinary or bowel changes.  She lives with her daughter at home.  Uses walker for ambulation.  No history of smoking, alcohol, street drug use.    ED Course: Upon arrival to ED: Patient's heart rate noted to be in the 130s, tachypneic, requiring 3 L of oxygen via nasal cannula.,  Afebrile with no leukocytosis, BNP: 528, digoxin level: 0.7, lactic acid, COVID-19, blood culture: Pending.  Chest x-ray shows interstitial prominence throughout both lungs, increased as compared to the prior exam.  Findings are nonspecific but may reflect interstitial edema or atypical/viral pneumonia.  Unchanged cardiomegaly.  Patient received metoprolol 5 mg, Lasix 20 mg and Rocephin and azithromycin in ED.  EDP consulted cardiology.  [facility] consulted for admission for A. fib with RVR and new onset acute hypoxemic  respiratory failure.  **Interim History  She continues to remain slightly dyspneic and has been wearing oxygen.  Family states that they have noticed worsening swelling.  Has never seen a cardiologist but has had PCP try and manage.  Echocardiogram has been done and shows a left ventricular ejection fraction of 50 to 55% with the left ventricle having a low normal function there is mild concentric left ventricular hypertrophy.  Left ventricular diastolic parameters were indeterminate and she has a right ventricular size function that is mildly reduced with severely dilated left atrial size as well as right atrial size and tricuspid regurgitation is moderate to severe.  Cardiology evaluated and feels that they will not be able to converted to normal sinus rhythm and maintain the rhythm so they can recommend rate control given her severe bilateral atrial enlargement.  Cardiology has adjusted her medications and they recommend continuing the Cardizem drip and restarting Toprol-XL 25 mg p.o. daily and titrate as needed for heart rate control.  Cardiology also recommends continue IV diuresis with Lasix for now   Assessment & Plan:   Principal Problem:   Acute hypoxemic respiratory failure (HCC) Active Problems:   Atrial fibrillation with RVR (HCC)   Essential hypertension   CKD (chronic kidney disease), stage III   Pressure injury of skin  Acute Respiratory Failure with Hypoxia Secondary to acute on chronic diastolic CHF/ less likely pneumonia? -Patient is requiring 3 L of oxygen via nasal cannula.   -She has leg swelling and weight gain and some orthopnea.  -Reviewed chest x-ray.  BNP: 528.  COVID-19 Negative . -Patient received Lasix 20 mg, Rocephin and  azithromycin in ED. -Admit patient at stepdown unit for close monitoring.  -SpO2: 98 % O2 Flow Rate (L/min): 3 L/min -On continuous pulse ox.  We will try to wean off of oxygen as tolerated. -Start on Lasix 40 IV twice every 12 hours and will  continue today.  Strict INO's and daily weight. -No transthoracic echo in the system but have ordered one and has been done -Check TSH and was 1.144.  Check procalcitonin level and was 1.23 and trended down to 1.06.   -Hold off Further  antibiotics at this time-as chest x-ray findings are likely secondary to fluid overload as patient has been afebrile and has no leukocytosis -Monitor electrolytes and kidney function closely. -DuoNebs as needed but may need to change to Xopenex/Atrovent given her A. fib -Continue to monitor for signs and symptoms of volume overload -We will need an ambulatory O2 screen prior to discharge as well as a PT OT evaluation. -Continue diuresis per cardiology and they have also changed some medications around -Chest x-ray today showed stable cardiomegaly and interstitial opacity that could be inflammatory or congestive. -Cardiology feels that her diastolic CHF is likely exacerbated by her elevated heart rate and they feel that she needs good heart rate control and strict low-sodium diet  Persistent A. Fib with RVR -Received metoprolol 5 mg once in ED. -Recently has been seeing her PCP who have been adjusting medication and she has had prior to admission medications with metoprolol XL 25 mg p.o. daily, diltiazem 180 mg p.o. daily, hydroxyzine 0.125 mg daily and she is currently anticoagulated with low-dose Eliquis -She is given IV metoprolol and started on IV diltiazem with heart rate control improvement  -Continue with anticoagulation with low-dose Eliquis -Cardiology recommending holding hydralazine and amlodipine to allow for greater blood pressure and continue diltiazem for now and titrate stop transition to p.o. -CHA2DS2-VASc is at least a 5 for Age, Sex, HTN, and CHF Hx -Monitor heart rate closely. -EDP consulted Cardiology and they recommend continuing diltiazem for now and titrating as blood pressure allows and once heart rate is optional transitioning to p.o.     -Cardiology also recommend restarting Toprol-XL 25 mg p.o. daily and titrating as needed for heart rate control as well as continuing the Cardizem gtt. Today -Appreciate further care per cardiology and they stopped the digoxin due to advanced age and stop amlodipine as she is already on a calcium channel blocker with the IV Cardizem; they feel will be very difficult to convert to normal sinus rhythm and maintain rhythm given her severe bilateral atrial enlargement so they would recommend continue rate control  Acquired Thrombophilia -CHA2DS2-VASc is at least a 5 for Age, Sex, HTN, and CHF Hx -Continue with low-dose Eliquis  Hypertension -On the softer side -Continue diltiazem drip -Cardiology recommending holding hydralazine and amlodipine to allow for greater blood pressure -Monitor blood pressure closely as last blood pressure was 111/76  Hyperlipidemia -Continue Atorvastatin 20 mg po Daily   Pulmonary Hypertension  -Moderate and likely group 2 related to pulmonary versus hypotension from CHF -She has moderate to severe tricuspid regurg and a right ventricular function is mildly reduced -Cardiology recommending continue IV diuretics  Metabolic Acidosis, improved -The patient's CO2 is 21, anion gap is 14, chloride level is 104; now her CO2 is 26, anion gap is 10, chloride level is 103 -We will continue to monitor and trend and repeat CMP in a.m.  Normocytic Anemia -H&H: 10.1/32.5 on admission was 13.2/41.28 months ago; repeat this morning globin  of 9.5 and hematocrit 30.5 -No active bleeding.  Monitor H&H closely.  Transfuse as needed. -Check anemia panel and it showed an iron level of 20, U IBC 239, TIBC 259, saturation ratios of 8%, ferritin level of 62, folate of 26.5, vitamin B12 470 -Continue to monitor for signs and symptoms of bleeding; currently no overt bleeding with  -Repeat CBC in a.m.  AKI on CKD stage IIIb -Creatinine: 1.36, GFR: 35 (previous creatinine: 1.89,  GFR: 58) -Was improving with diuresis and now BUN/creatinine is 14/1.9 today but today is slightly worsened is now 18/1.43 -Continue to avoid nephrotoxic medications, contrast dyes, hypotension and renally adjust medications -Repeat CMP in a.m.  DVT prophylaxis: Anticoagulated with Apixaban 2.5 mg po BID  Code Status: FULL CODE  Family Communication: Discussed with family at bedside  Disposition Plan: Ending cardiac clearance and titration off of Cardizem gtt  Status is: Inpatient  Remains inpatient appropriate because:Ongoing diagnostic testing needed not appropriate for outpatient work up, Unsafe d/c plan, IV treatments appropriate due to intensity of illness or inability to take PO and Inpatient level of care appropriate due to severity of illness   Dispo: The patient is from: Home              Anticipated d/c is to: TBD              Anticipated d/c date is: 2 days              Patient currently is not medically stable to d/c.  Consultants:   Cardiology   Procedures: ECHOCARDIOGRAM (done and pending read)  Antimicrobials:  Anti-infectives (From admission, onward)   Start     Dose/Rate Route Frequency Ordered Stop   04/15/20 1630  cefTRIAXone (ROCEPHIN) 1 g in sodium chloride 0.9 % 100 mL IVPB        1 g 200 mL/hr over 30 Minutes Intravenous  Once 04/15/20 1620 04/15/20 1920   04/15/20 1630  azithromycin (ZITHROMAX) 500 mg in sodium chloride 0.9 % 250 mL IVPB        500 mg 250 mL/hr over 60 Minutes Intravenous  Once 04/15/20 1620 04/15/20 2046      Subjective: Seen and examined at bedside and she is eating her breakfast without issues and happy that she got some food.  No nausea or vomiting.  She is very hard of hearing.  Denies any shortness of breath or chest pain today.  No lightheadedness or dizziness.  No other concerns or complaints at this time.  Objective: Vitals:   04/17/20 0516 04/17/20 0717 04/17/20 0727 04/17/20 0910  BP: 113/61 126/71 127/75 111/76  Pulse:  (!) 112 (!) 120 (!) 123 (!) 115  Resp: 19 20    Temp: (!) 97.5 F (36.4 C)  (!) 97.4 F (36.3 C)   TempSrc: Oral  Oral   SpO2: 98% 98%    Weight:      Height:        Intake/Output Summary (Last 24 hours) at 04/17/2020 1142 Last data filed at 04/17/2020 0500 Gross per 24 hour  Intake 185.68 ml  Output 1000 ml  Net -814.32 ml   Filed Weights   04/17/20 0118  Weight: 45.4 kg   Examination: Physical Exam:  Constitutional: WN/WD thin elderly Caucasian female currently in no acute distress appears calm and comfortable eating her breakfast Eyes: Lids and conjunctivae normal, sclerae anicteric  ENMT: External Ears, Nose appear normal.  She is hard of hearing Neck: Appears normal, supple, no cervical  masses, normal ROM, no appreciable thyromegaly: No JVD Respiratory: Diminished to auscultation bilaterally with coarse breath sounds and some slight crackles but no appreciable wheezing, rales, rhonchi.  She has unlabored breathing but continues to wear supplemental oxygen via nasal cannula Cardiovascular: Irregularly irregular and tachycardic, no murmurs / rubs / gallops. S1 and S2 auscultated.  Has trace to 1+ lower extremity edema. Abdomen: Soft, non-tender, non-distended. Bowel sounds positive.  GU: Deferred. Musculoskeletal: No clubbing / cyanosis of digits/nails. No joint deformity upper and lower extremities. .  Skin: No rashes, lesions, ulcers limited skin evaluation. No induration; Warm and dry.  Neurologic: CN 2-12 grossly intact with no focal deficits. Romberg sign and cerebellar reflexes not assessed.  Psychiatric: Normal judgment and insight. Alert and oriented x 3. Normal mood and appropriate affect.   Data Reviewed: I have personally reviewed following labs and imaging studies  CBC: Recent Labs  Lab 04/15/20 1541 04/17/20 0846  WBC 9.0 10.0  NEUTROABS 7.8* 8.6*  HGB 10.1* 9.5*  HCT 32.5* 30.5*  MCV 84.0 83.3  PLT 374 976   Basic Metabolic Panel: Recent Labs  Lab  04/15/20 1622 04/16/20 0330 04/17/20 0846  NA 139 139 139  K 4.2 4.0 3.5  CL 106 104 103  CO2 22 21* 26  GLUCOSE 108* 95 91  BUN 15 14 18   CREATININE 1.36* 1.19* 1.43*  CALCIUM 8.6* 8.2* 8.1*  MG 2.5*  --  1.8  PHOS  --   --  3.5   GFR: Estimated Creatinine Clearance: 19.9 mL/min (A) (by C-G formula based on SCr of 1.43 mg/dL (H)). Liver Function Tests: Recent Labs  Lab 04/15/20 1622 04/17/20 0846  AST 22 18  ALT 18 15  ALKPHOS 70 57  BILITOT 0.6 0.5  PROT 7.2 6.3*  ALBUMIN 2.5* 2.3*   No results for input(s): LIPASE, AMYLASE in the last 168 hours. No results for input(s): AMMONIA in the last 168 hours. Coagulation Profile: Recent Labs  Lab 04/15/20 1629  INR 1.5*   Cardiac Enzymes: No results for input(s): CKTOTAL, CKMB, CKMBINDEX, TROPONINI in the last 168 hours. BNP (last 3 results) No results for input(s): PROBNP in the last 8760 hours. HbA1C: No results for input(s): HGBA1C in the last 72 hours. CBG: No results for input(s): GLUCAP in the last 168 hours. Lipid Profile: No results for input(s): CHOL, HDL, LDLCALC, TRIG, CHOLHDL, LDLDIRECT in the last 72 hours. Thyroid Function Tests: Recent Labs    04/15/20 2044  TSH 1.144   Anemia Panel: Recent Labs    04/17/20 0846  VITAMINB12 470  FOLATE 26.5  FERRITIN 62  TIBC 259  IRON 20*  RETICCTPCT 1.9   Sepsis Labs: Recent Labs  Lab 04/15/20 2044 04/15/20 2055 04/16/20 0330  PROCALCITON 1.23  --  1.06  LATICACIDVEN 1.2 0.7  --     Recent Results (from the past 240 hour(s))  Blood culture (routine x 2)     Status: None (Preliminary result)   Collection Time: 04/15/20  4:25 PM   Specimen: BLOOD RIGHT HAND  Result Value Ref Range Status   Specimen Description BLOOD RIGHT HAND  Final   Special Requests   Final    BOTTLES DRAWN AEROBIC AND ANAEROBIC Blood Culture results may not be optimal due to an inadequate volume of blood received in culture bottles   Culture   Final    NO GROWTH < 24  HOURS Performed at [facility], [place] [address], [place], [place] [address]  Report Status PENDING  Incomplete  Blood culture (routine x 2)     Status: None (Preliminary result)   Collection Time: 04/15/20  4:40 PM   Specimen: BLOOD RIGHT ARM  Result Value Ref Range Status   Specimen Description BLOOD RIGHT ARM  Final   Special Requests   Final    BOTTLES DRAWN AEROBIC AND ANAEROBIC Blood Culture results may not be optimal due to an excessive volume of blood received in culture bottles   Culture   Final    NO GROWTH < 24 HOURS Performed at [facility], [place]. [address] [place], [place] [address]    Report Status PENDING  Incomplete  SARS Coronavirus 2 by RT PCR (hospital order, performed in [facility] hospital lab) Nasopharyngeal Nasopharyngeal Swab     Status: None   Collection Time: 04/15/20  9:52 PM   Specimen: Nasopharyngeal Swab  Result Value Ref Range Status   SARS Coronavirus 2 NEGATIVE NEGATIVE Final    Comment: (NOTE) SARS-CoV-2 target nucleic acids are NOT DETECTED.  The SARS-CoV-2 RNA is generally detectable in upper and lower respiratory specimens during the acute phase of infection. The lowest concentration of SARS-CoV-2 viral copies this assay can detect is 250 copies / mL. A negative result does not preclude SARS-CoV-2 infection and should not be used as the sole basis for treatment or other patient management decisions.  A negative result may occur with improper specimen collection / handling, submission of specimen other than nasopharyngeal swab, presence of viral mutation(s) within the areas targeted by this assay, and inadequate number of viral copies (<250 copies / mL). A negative result must be combined with clinical observations, patient history, and epidemiological information.  Fact Sheet for Patients:   StrictlyIdeas.no  Fact Sheet for Healthcare Providers: BankingDealers.co.za  This test  is not yet approved or  cleared by the [place] FDA and has been authorized for detection and/or diagnosis of SARS-CoV-2 by FDA under an Emergency Use Authorization (EUA).  This EUA will remain in effect (meaning this test can be used) for the duration of the COVID-19 declaration under Section 564(b)(1) of the Act, 21 U.S.C. section 360bbb-3(b)(1), unless the authorization is terminated or revoked sooner.  Performed at [facility], [place] [address], [place],  [address]      RN Pressure Injury Documentation: Pressure Injury 04/17/20 Buttocks Left Stage 2 -  Partial thickness loss of dermis presenting as a shallow open injury with a red, pink wound bed without slough. (Active)  04/17/20 0132  Location: Buttocks  Location Orientation: Left  Staging: Stage 2 -  Partial thickness loss of dermis presenting as a shallow open injury with a red, pink wound bed without slough.  Wound Description (Comments):   Present on Admission:      Estimated body mass index is 19.55 kg/m as calculated from the following:   Height as of this encounter: 5' (1.524 m).   Weight as of this encounter: 45.4 kg.  Malnutrition Type:      Malnutrition Characteristics:      Nutrition Interventions:    Radiology Studies: DG CHEST PORT 1 VIEW  Result Date: 04/17/2020 CLINICAL DATA:  Acute hypoxic respiratory failure EXAM: PORTABLE CHEST 1 VIEW COMPARISON:  Two days ago FINDINGS: Interstitial coarsening which is essentially stable from most recent prior and accentuated compared to more remote priors. Cardiomegaly and vascular pedicle widening. Extensive atherosclerotic calcification. Probable small left pleural effusion. No pneumothorax. Midthoracic compression fractures. IMPRESSION: Stable cardiomegaly and interstitial  opacity that could be inflammatory or congestive. Electronically Signed   By: Monte Fantasia M.D.   On: 04/17/2020 07:11   DG Chest Port 1 View  Result Date:  04/15/2020 CLINICAL DATA:  Shortness of breath. EXAM: PORTABLE CHEST 1 VIEW COMPARISON:  Prior chest radiograph 03/31/2020 and earlier FINDINGS: Unchanged cardiomegaly. Aortic atherosclerosis. There are interstitial opacities throughout both lungs, increased in conspicuity as compared to chest radiograph 03/31/2020. Additionally, there is left basilar opacity with silhouetting of the left hemidiaphragm which may reflect atelectasis or consolidation. Additionally, a small pleural effusion may be present. No evidence of pneumothorax. No acute bony abnormality identified. Chronic multilevel thoracic compression deformities. IMPRESSION: Interstitial prominence throughout both lungs, increased as compared to the prior exam of 03/31/2020. Findings are nonspecific, but may reflect interstitial edema or atypical/viral pneumonia. Opacity at the left lung base, consistent with atelectasis or consolidation. Unchanged cardiomegaly. Aortic Atherosclerosis (ICD10-I70.0). Electronically Signed   By: Kellie Simmering DO   On: 04/15/2020 16:15   ECHOCARDIOGRAM COMPLETE  Result Date: 04/16/2020    ECHOCARDIOGRAM REPORT   Patient Name:   Kelli Peterson Date of Exam: 04/16/2020 Medical Rec #:  [id number]        Height:       60.0 in Accession #:    [id number]       Weight:       112.4 lb Date of Birth:  [birth year]         BSA:          1.461 m Patient Age:    63 years         BP:           105/73 mmHg Patient Gender: F                HR:           93 bpm. Exam Location:  Inpatient Procedure: 2D Echo, Cardiac Doppler and Color Doppler Indications:    Atrial fibrillation  History:        Patient has no prior history of Echocardiogram examinations.                 CHF, COPD; Risk Factors:Hypertension and Dyslipidemia. CKD.  Sonographer:    Clayton Lefort RDCS (AE) Referring Phys: [id number] Michell Heinrich [facility] IMPRESSIONS  1. Left ventricular ejection fraction, by estimation, is 50 to 55%. The left ventricle has low normal function. The left  ventricle has no regional wall motion abnormalities. There is mild concentric left ventricular hypertrophy. Left ventricular diastolic parameters are indeterminate.  2. Right ventricular systolic function is mildly reduced. The right ventricular size is normal. There is moderately elevated pulmonary artery systolic pressure.  3. Left atrial size was severely dilated.  4. Right atrial size was severely dilated.  5. The mitral valve is normal in structure. Mild mitral valve regurgitation. No evidence of mitral stenosis.  6. Tricuspid valve regurgitation is moderate to severe.  7. The aortic valve is normal in structure. Aortic valve regurgitation is not visualized. No aortic stenosis is present.  8. The inferior vena cava is normal in size with greater than 50% respiratory variability, suggesting right atrial pressure of 3 mmHg. FINDINGS  Left Ventricle: Left ventricular ejection fraction, by estimation, is 50 to 55%. The left ventricle has low normal function. The left ventricle has no regional wall motion abnormalities. The left ventricular internal cavity size was normal in size. There is mild concentric left ventricular hypertrophy. Left ventricular diastolic parameters are indeterminate. Right  Ventricle: The right ventricular size is normal. No increase in right ventricular wall thickness. Right ventricular systolic function is mildly reduced. There is moderately elevated pulmonary artery systolic pressure. The tricuspid regurgitant velocity is 3.30 m/s, and with an assumed right atrial pressure of 3 mmHg, the estimated right ventricular systolic pressure is 30.1 mmHg. Left Atrium: Left atrial size was severely dilated. Right Atrium: Right atrial size was severely dilated. Pericardium: There is no evidence of pericardial effusion. Mitral Valve: The mitral valve is normal in structure. Normal mobility of the mitral valve leaflets. Mild mitral annular calcification. Mild mitral valve regurgitation. No evidence of  mitral valve stenosis. Tricuspid Valve: The tricuspid valve is normal in structure. Tricuspid valve regurgitation is moderate to severe. No evidence of tricuspid stenosis. Aortic Valve: The aortic valve is normal in structure.. There is moderate thickening and moderate calcification of the aortic valve. Aortic valve regurgitation is not visualized. No aortic stenosis is present. There is moderate thickening of the aortic valve. There is moderate calcification of the aortic valve. Aortic valve mean gradient measures 4.0 mmHg. Aortic valve peak gradient measures 7.2 mmHg. Aortic valve area, by VTI measures 1.20 cm. Pulmonic Valve: The pulmonic valve was normal in structure. Pulmonic valve regurgitation is not visualized. No evidence of pulmonic stenosis. Aorta: The aortic root is normal in size and structure. Venous: The inferior vena cava is normal in size with greater than 50% respiratory variability, suggesting right atrial pressure of 3 mmHg. IAS/Shunts: No atrial level shunt detected by color flow Doppler.  LEFT VENTRICLE PLAX 2D LVIDd:         3.80 cm LVIDs:         2.80 cm LV PW:         1.20 cm LV IVS:        1.37 cm LVOT diam:     1.80 cm LV SV:         32 LV SV Index:   22 LVOT Area:     2.54 cm  RIGHT VENTRICLE            IVC RV Basal diam:  3.60 cm    IVC diam: 1.00 cm RV Mid diam:    2.90 cm RV S prime:     5.43 cm/s TAPSE (M-mode): 0.9 cm LEFT ATRIUM             Index       RIGHT ATRIUM           Index LA diam:        3.80 cm 2.60 cm/m  RA Area:     24.10 cm LA Vol (A2C):   62.6 ml 42.84 ml/m RA Volume:   67.90 ml  46.46 ml/m LA Vol (A4C):   50.0 ml 34.22 ml/m LA Biplane Vol: 57.6 ml 39.42 ml/m  AORTIC VALVE AV Area (Vmax):    1.43 cm AV Area (Vmean):   1.22 cm AV Area (VTI):     1.20 cm AV Vmax:           134.00 cm/s AV Vmean:          97.300 cm/s AV VTI:            0.269 m AV Peak Grad:      7.2 mmHg AV Mean Grad:      4.0 mmHg LVOT Vmax:         75.10 cm/s LVOT Vmean:        46.500 cm/s  LVOT VTI:  0.127 m LVOT/AV VTI ratio: 0.47  AORTA Ao Root diam: 3.10 cm Ao Asc diam:  3.00 cm TRICUSPID VALVE TR Peak grad:   43.6 mmHg TR Vmax:        330.00 cm/s  SHUNTS Systemic VTI:  0.13 m Systemic Diam: 1.80 cm Skeet Latch MD Electronically signed by Skeet Latch MD Signature Date/Time: 04/16/2020/1:58:37 PM    Final    Scheduled Meds: . apixaban  2.5 mg Oral BID  . atorvastatin  20 mg Oral Daily  . brimonidine  1 drop Right Eye BID  . feeding supplement (ENSURE ENLIVE)  237 mL Oral BID BM  . furosemide  40 mg Intravenous Q12H  . latanoprost  1 drop Both Eyes Q1200  . mouth rinse  15 mL Mouth Rinse BID  . metoprolol succinate  25 mg Oral Daily  . Netarsudil Dimesylate  1 drop Right Eye Daily  . sodium chloride flush  3 mL Intravenous Q12H   Continuous Infusions: . sodium chloride    . diltiazem (CARDIZEM) infusion 12.5 mg/hr (04/17/20 1013)    LOS: 2 days   Kerney Elbe, DO [facility] PAGER is on [place]  If 7PM-7AM, please contact night-coverage [email or web address]

## 2020-04-17 NOTE — Evaluation (Signed)
Physical Therapy Evaluation Patient Details Name: Kelli Peterson MRN: [id number] DOB: July 22, 1930 Today's Date: 04/17/2020   History of Present Illness  Pt is an 86 y/o female admitted secondary to palpitations and SOB x3 weeks. Pt found to have acute respiratory failure with hypoxemia and a-fib with RVR. PMH including but not limited to HTN, CHF, a-fib and cervical cancer.    Clinical Impression  Pt presented supine in bed with HOB elevated, awake and willing to participate in therapy session. Prior to admission, pt reported that she ambulated with use of a cane and was independent with ADLs. Pt stated that she lives with her daughter in a single level house with one small step to enter. At the time of evaluation, pt significantly limited with functional mobility secondary to fatigue, weakness and SOB with minimal activity. Pt on 2L of O2 throughout with SpO2 decreasing to as low as 88% with activity, but quickly recovering to mid 90's with rest. Pt's HR stable throughout. She required min guard for bed mobility, min A for transfers and only tolerated small side steps at EOB with min A. Based on pt's current functional mobility status and lack of 24/7 supervision/assistance at home (per pt's report), would recommend pt d/c to SNF for short term rehab to maximize her independence with functional mobility prior to returning home with family support. Pt would continue to benefit from skilled physical therapy services at this time while admitted and after d/c to address the below listed limitations in order to improve overall safety and independence with functional mobility.     Follow Up Recommendations SNF;Other (comment) (if pt/family refuses, will need 24/7 assistance and HHPT)    Equipment Recommendations  None recommended by PT    Recommendations for Other Services       Precautions / Restrictions Precautions Precautions: Fall Precaution Comments: monitor SpO2 Restrictions Weight Bearing  Restrictions: No      Mobility  Bed Mobility Overal bed mobility: Needs Assistance Bed Mobility: Supine to Sit;Sit to Supine     Supine to sit: Min guard Sit to supine: Min guard   General bed mobility comments: increased time and effort, HOB elevated, use of bed rails, no physical assistance needed to achieve upright sitting or to scoot forwards  Transfers Overall transfer level: Needs assistance Equipment used: None Transfers: Sit to/from Stand Sit to Stand: Min assist         General transfer comment: excessive anterior weight shift and forward lean to power up into standing from EOB; min A for stability  Ambulation/Gait Ambulation/Gait assistance: Min assist   Assistive device: 1 person hand held assist       General Gait Details: pt only able to tolerate take 2-3 side steps at EOB with min A for stability; significantly limited secondary to fatigue and weakness  Stairs            Wheelchair Mobility    Modified Rankin (Stroke Patients Only)       Balance Overall balance assessment: Needs assistance Sitting-balance support: Feet supported Sitting balance-Leahy Scale: Fair     Standing balance support: Single extremity supported;Bilateral upper extremity supported Standing balance-Leahy Scale: Poor                               Pertinent Vitals/Pain Pain Assessment: No/denies pain    Home Living Family/patient expects to be discharged to:: Private residence Living Arrangements: Children Available Help at Discharge: Family;Available PRN/intermittently  Type of Home: House Home Access: Stairs to enter   [employer] of Steps: 1 Home Layout: One level Home Equipment: [place] - 2 wheels;Cane - single point      Prior Function Level of Independence: Independent with assistive device(s)         Comments: ambulates with a cane      Hand Dominance        Extremity/Trunk Assessment   Upper Extremity  Assessment Upper Extremity Assessment: Defer to OT evaluation;Generalized weakness    Lower Extremity Assessment Lower Extremity Assessment: Generalized weakness    Cervical / Trunk Assessment Cervical / Trunk Assessment: Kyphotic  Communication   Communication: Expressive difficulties  Cognition Arousal/Alertness: Awake/alert Behavior During Therapy: Flat affect Overall Cognitive Status: Impaired/Different from baseline Area of Impairment: Following commands;Safety/judgement;Awareness;Problem solving                       Following Commands: Follows one step commands with increased time Safety/Judgement: Decreased awareness of deficits;Decreased awareness of safety Awareness: Intellectual Problem Solving: Decreased initiation;Slow processing;Difficulty sequencing;Requires verbal cues        General Comments      Exercises     Assessment/Plan    PT Assessment Patient needs continued PT services  PT Problem List Decreased strength;Decreased range of motion;Decreased activity tolerance;Decreased balance;Decreased mobility;Decreased coordination;Decreased knowledge of use of DME;Decreased safety awareness;Decreased knowledge of precautions;Cardiopulmonary status limiting activity       PT Treatment Interventions DME instruction;Gait training;Stair training;Functional mobility training;Therapeutic activities;Therapeutic exercise;Balance training;Neuromuscular re-education;Patient/family education    PT Goals (Current goals can be found in the Care Plan section)  Acute Rehab PT Goals Patient Stated Goal: to rest PT Goal Formulation: With patient Time For Goal Achievement: 05/01/20 Potential to Achieve Goals: Fair    Frequency Min 3X/week   Barriers to discharge        Co-evaluation               AM-PAC PT "6 Clicks" Mobility  Outcome Measure Help needed turning from your back to your side while in a flat bed without using bedrails?: None Help needed  moving from lying on your back to sitting on the side of a flat bed without using bedrails?: None Help needed moving to and from a bed to a chair (including a wheelchair)?: A Little Help needed standing up from a chair using your arms (e.g., wheelchair or bedside chair)?: A Little Help needed to walk in hospital room?: A Little Help needed climbing 3-5 steps with a railing? : A Lot 6 Click Score: 19    End of Session Equipment Utilized During Treatment: Oxygen Activity Tolerance: Patient limited by fatigue Patient left: in bed;with call bell/phone within reach;with bed alarm set Nurse Communication: Mobility status PT Visit Diagnosis: Other abnormalities of gait and mobility (R26.89);Muscle weakness (generalized) (M62.81)    Time: 2426-8341 PT Time Calculation (min) (ACUTE ONLY): 17 min   Charges:   PT Evaluation $PT Eval Moderate Complexity: 1 Mod          Eduard Clos, PT, DPT  Acute Rehabilitation Services Pager [phone] Office [place] 04/17/2020, 1:51 PM

## 2020-04-17 NOTE — TOC Initial Note (Signed)
Transition of Care [facility]) - Initial/Assessment Note    Patient Details  Name: Kelli Peterson MRN: [id number] Date of Birth: Apr 01, 1930  Transition of Care [facility]) CM/SW Contact:    Trula Ore, [place] Phone Number: 04/17/2020, 5:20 PM  Clinical Narrative:                  CSW spoke with patients daughter Kelli Peterson. Patients daughter is agreeable to SNF placement for patient. Patients daughters first choice would be [facility] and second choice would be [facility]. Patients daughter gave CSW permission to fax out initial referral near [facility] area.  Pending bed offers. CSW will start insurance authorization.  CSW will continue to follow.  Expected Discharge Plan: Skilled Nursing Facility Barriers to Discharge: Continued Medical Work up   Patient Goals and CMS Choice Patient states their goals for this hospitalization and ongoing recovery are:: to go ot SNF CMS Medicare.gov Compare Post Acute Care list provided to:: Patient Represenative (must comment) (daughter Kelli Peterson) Choice offered to / list presented to : Adult Children Kelli Peterson)  Expected Discharge Plan and Services Expected Discharge Plan: [place] arrangements for the past 2 months: Single Family Home                                      Prior Living Arrangements/Services Living arrangements for the past 2 months: Single Family Home Lives with:: Self, Adult Children Kelli Peterson) Patient language and need for interpreter reviewed:: Yes Do you feel safe going back to the place where you live?: No   SNF  Need for Family Participation in Patient Care: Yes (Comment) Care giver support system in place?: Yes (comment)   Criminal Activity/Legal Involvement Pertinent to Current Situation/Hospitalization: No - Comment as needed  Activities of Daily Living Home Assistive Devices/Equipment: Environmental consultant (specify type), Wheelchair ADL Screening (condition at time of admission) Patient's cognitive  ability adequate to safely complete daily activities?: Yes Is the patient deaf or have difficulty hearing?: Yes Does the patient have difficulty seeing, even when wearing glasses/contacts?: Yes Does the patient have difficulty concentrating, remembering, or making decisions?: Yes Patient able to express need for assistance with ADLs?: Yes Does the patient have difficulty dressing or bathing?: Yes Independently performs ADLs?: No Communication: Independent Dressing (OT): Needs assistance Is this a change from baseline?: Pre-admission baseline Grooming: Independent Feeding: Independent Bathing: Needs assistance Is this a change from baseline?: Pre-admission baseline Toileting: Needs assistance Is this a change from baseline?: Pre-admission baseline In/Out Bed: Needs assistance Is this a change from baseline?: Pre-admission baseline Walks in Home: Independent with device (comment) Does the patient have difficulty walking or climbing stairs?: Yes Weakness of Legs: Both Weakness of Arms/Hands: Both  Permission Sought/Granted Permission sought to share information with : Case Manager, Family Supports, Chartered certified accountant granted to share information with : No              Emotional Assessment         Alcohol / Substance Use: Not Applicable Psych Involvement: No (comment)  Admission diagnosis:  High output heart failure (HCC) [I50.83] SOB (shortness of breath) [R06.02] Atrial fibrillation with rapid ventricular response ([place]) [I48.91] Atrial fibrillation with RVR ([place]) [I48.91] Patient Active Problem List   Diagnosis Date Noted  . Pressure injury of skin 04/17/2020  . Atrial fibrillation with rapid ventricular response ([place])   . Acute on  chronic diastolic (congestive) heart failure ([place])   . Pulmonary hypertension ([place])   . Acute kidney injury superimposed on chronic kidney disease ([place])   . CKD (chronic kidney disease), stage III 04/15/2020  . Acute  hypoxemic respiratory failure ([place]) 04/15/2020  . Branch retinal vein occlusion with macular edema of left eye 01/23/2020  . Branch retinal vein occlusion of right eye 01/23/2020  . Exudative age-related macular degeneration of right eye with inactive choroidal neovascularization ([place]) 01/23/2020  . Intermediate stage nonexudative age-related macular degeneration of left eye 01/23/2020  . Protein-calorie malnutrition, severe 06/02/2018  . AKI (acute kidney injury) ([place]) 05/31/2018  . Dehydration 05/31/2018  . Essential hypertension 05/31/2018  . Atrial fibrillation, chronic ([place]) 05/31/2018  . COPD (chronic obstructive pulmonary disease) ([place]) 05/31/2018  . Glaucoma 05/31/2018  . Hyperlipidemia 05/31/2018  . Acute renal failure (ARF) ([place]) 05/31/2018  . Constipation 05/31/2018  . Acute right hip pain   . Atrial fibrillation with RVR ([place])   . Pelvic fracture, closed, initial encounter 02/15/2016  . Fracture of pubic ramus ([place]) 02/15/2016   PCP:  Jani Gravel, MD Pharmacy:   [id number] [place], [place] [address] Phone: [phone] Fax: [phone]     Social Determinants of Health (SDOH) Interventions    Readmission Risk Interventions No flowsheet data found.

## 2020-04-18 DIAGNOSIS — I4891 Unspecified atrial fibrillation: Secondary | ICD-10-CM | POA: Diagnosis not present

## 2020-04-18 DIAGNOSIS — I5031 Acute diastolic (congestive) heart failure: Secondary | ICD-10-CM | POA: Diagnosis not present

## 2020-04-18 DIAGNOSIS — N1831 Chronic kidney disease, stage 3a: Secondary | ICD-10-CM | POA: Diagnosis not present

## 2020-04-18 DIAGNOSIS — I10 Essential (primary) hypertension: Secondary | ICD-10-CM | POA: Diagnosis not present

## 2020-04-18 LAB — CBC WITH DIFFERENTIAL/PLATELET
Abs Immature Granulocytes: 0.04 10*3/uL (ref 0.00–0.07)
Basophils Absolute: 0 10*3/uL (ref 0.0–0.1)
Basophils Relative: 0 %
Eosinophils Absolute: 0.4 10*3/uL (ref 0.0–0.5)
Eosinophils Relative: 4 %
HCT: 29.6 % — ABNORMAL LOW (ref 36.0–46.0)
Hemoglobin: 9.5 g/dL — ABNORMAL LOW (ref 12.0–15.0)
Immature Granulocytes: 0 %
Lymphocytes Relative: 8 %
Lymphs Abs: 0.7 10*3/uL (ref 0.7–4.0)
MCH: 26.2 pg (ref 26.0–34.0)
MCHC: 32.1 g/dL (ref 30.0–36.0)
MCV: 81.8 fL (ref 80.0–100.0)
Monocytes Absolute: 0.9 10*3/uL (ref 0.1–1.0)
Monocytes Relative: 10 %
Neutro Abs: 7.3 10*3/uL (ref 1.7–7.7)
Neutrophils Relative %: 78 %
Platelets: 283 10*3/uL (ref 150–400)
RBC: 3.62 MIL/uL — ABNORMAL LOW (ref 3.87–5.11)
RDW: 17.8 % — ABNORMAL HIGH (ref 11.5–15.5)
WBC: 9.4 10*3/uL (ref 4.0–10.5)
nRBC: 0 % (ref 0.0–0.2)

## 2020-04-18 LAB — COMPREHENSIVE METABOLIC PANEL
ALT: 15 U/L (ref 0–44)
AST: 19 U/L (ref 15–41)
Albumin: 2.2 g/dL — ABNORMAL LOW (ref 3.5–5.0)
Alkaline Phosphatase: 52 U/L (ref 38–126)
Anion gap: 9 (ref 5–15)
BUN: 25 mg/dL — ABNORMAL HIGH (ref 8–23)
CO2: 30 mmol/L (ref 22–32)
Calcium: 8.2 mg/dL — ABNORMAL LOW (ref 8.9–10.3)
Chloride: 98 mmol/L (ref 98–111)
Creatinine, Ser: 1.45 mg/dL — ABNORMAL HIGH (ref 0.44–1.00)
GFR calc Af Amer: 37 mL/min — ABNORMAL LOW (ref >60)
GFR calc non Af Amer: 32 mL/min — ABNORMAL LOW (ref >60)
Glucose, Bld: 101 mg/dL — ABNORMAL HIGH (ref 70–99)
Potassium: 3.6 mmol/L (ref 3.5–5.1)
Sodium: 137 mmol/L (ref 135–145)
Total Bilirubin: 0.7 mg/dL (ref 0.3–1.2)
Total Protein: 6.3 g/dL — ABNORMAL LOW (ref 6.5–8.1)

## 2020-04-18 LAB — PHOSPHORUS: Phosphorus: 3.7 mg/dL (ref 2.5–4.6)

## 2020-04-18 LAB — MAGNESIUM: Magnesium: 1.7 mg/dL (ref 1.7–2.4)

## 2020-04-18 MED ORDER — METOPROLOL SUCCINATE ER 25 MG PO TB24
25.0000 mg | ORAL_TABLET | Freq: Once | ORAL | Status: AC
  Administered 2020-04-18: 25 mg via ORAL
  Filled 2020-04-18: qty 1

## 2020-04-18 MED ORDER — METOPROLOL SUCCINATE ER 50 MG PO TB24
50.0000 mg | ORAL_TABLET | Freq: Every day | ORAL | Status: DC
  Administered 2020-04-19: 50 mg via ORAL
  Filled 2020-04-18: qty 1

## 2020-04-18 MED ORDER — POTASSIUM CITRATE ER 10 MEQ (1080 MG) PO TBCR
40.0000 meq | EXTENDED_RELEASE_TABLET | Freq: Once | ORAL | Status: AC
  Administered 2020-04-18: 40 meq via ORAL
  Filled 2020-04-18: qty 4

## 2020-04-18 NOTE — Progress Notes (Addendum)
Progress Note  Patient Name: Kelli Peterson Date of Encounter: 04/18/2020  Primary Cardiologist: No primary care provider on file.   Subjective   Denies any chest pain or SOB.  Appetite great.  Continues to have episodes of afib with RVR up to the 120's  Inpatient Medications    Scheduled Meds: . apixaban  2.5 mg Oral BID  . atorvastatin  20 mg Oral Daily  . brimonidine  1 drop Right Eye BID  . feeding supplement (ENSURE ENLIVE)  237 mL Oral BID BM  . furosemide  40 mg Intravenous Q12H  . latanoprost  1 drop Both Eyes Q1200  . mouth rinse  15 mL Mouth Rinse BID  . metoprolol succinate  25 mg Oral Daily  . multivitamin with minerals  1 tablet Oral Daily  . Netarsudil Dimesylate  1 drop Right Eye Daily  . potassium citrate  40 mEq Oral Once  . sodium chloride flush  3 mL Intravenous Q12H   Continuous Infusions: . sodium chloride    . diltiazem (CARDIZEM) infusion 12.5 mg/hr (04/17/20 2329)   PRN Meds: sodium chloride, acetaminophen, ipratropium-albuterol, ondansetron (ZOFRAN) IV, sodium chloride flush   Vital Signs    Vitals:   04/18/20 0058 04/18/20 0413 04/18/20 0733 04/18/20 1225  BP: 116/76 114/80 118/89 130/82  Pulse: 87 66 (!) 101 (!) 102  Resp: 15 18 17 17   Temp:  98 F (36.7 C) 97.6 F (36.4 C) 98.6 F (37 C)  TempSrc: Axillary Axillary Oral Oral  SpO2: 97% 95% 94% 94%  Weight:  44.3 kg    Height:        Intake/Output Summary (Last 24 hours) at 04/18/2020 1255 Last data filed at 04/18/2020 0500 Gross per 24 hour  Intake 3 ml  Output 800 ml  Net -797 ml   Filed Weights   04/17/20 0118 04/18/20 0413  Weight: 45.4 kg 44.3 kg    Telemetry    Atrial fibrillation with RVR up to the 120's - Personally Reviewed  ECG    No new EKG to review - Personally Reviewed  Physical Exam   GEN: Well nourished, well developed in no acute distress HEENT: Normal NECK: No JVD; No carotid bruits LYMPHATICS: No lymphadenopathy CARDIAC:irregularlly irregular  and tachy, no murmurs, rubs, gallops RESPIRATORY:  Bilateral crackles ABDOMEN: Soft, non-tender, non-distended MUSCULOSKELETAL: 1+ LE edema; No deformity  SKIN: Warm and dry NEUROLOGIC:  Alert and oriented x 3 PSYCHIATRIC:  Normal affect    Labs    Chemistry Recent Labs  Lab 04/15/20 1622 04/15/20 1622 04/16/20 0330 04/17/20 0846 04/18/20 0319  NA 139   < > 139 139 137  K 4.2   < > 4.0 3.5 3.6  CL 106   < > 104 103 98  CO2 22   < > 21* 26 30  GLUCOSE 108*   < > 95 91 101*  BUN 15   < > 14 18 25*  CREATININE 1.36*   < > 1.19* 1.43* 1.45*  CALCIUM 8.6*   < > 8.2* 8.1* 8.2*  PROT 7.2  --   --  6.3* 6.3*  ALBUMIN 2.5*  --   --  2.3* 2.2*  AST 22  --   --  18 19  ALT 18  --   --  15 15  ALKPHOS 70  --   --  57 52  BILITOT 0.6  --   --  0.5 0.7  GFRNONAA 35*   < > 41* 33* 32*  GFRAA 40*   < > 48* 38* 37*  ANIONGAP 11   < > 14 10 9    < > = values in this interval not displayed.     Hematology Recent Labs  Lab 04/15/20 1541 04/17/20 0846 04/18/20 0319  WBC 9.0 10.0 9.4  RBC 3.87 3.66*  3.65* 3.62*  HGB 10.1* 9.5* 9.5*  HCT 32.5* 30.5* 29.6*  MCV 84.0 83.3 81.8  MCH 26.1 26.0 26.2  MCHC 31.1 31.1 32.1  RDW 19.3* 17.9* 17.8*  PLT 374 295 283    Cardiac EnzymesNo results for input(s): TROPONINI in the last 168 hours. No results for input(s): TROPIPOC in the last 168 hours.   BNP Recent Labs  Lab 04/15/20 1541  BNP 528.5*     DDimer No results for input(s): DDIMER in the last 168 hours.   Radiology    DG CHEST PORT 1 VIEW  Result Date: 04/17/2020 CLINICAL DATA:  Acute hypoxic respiratory failure EXAM: PORTABLE CHEST 1 VIEW COMPARISON:  Two days ago FINDINGS: Interstitial coarsening which is essentially stable from most recent prior and accentuated compared to more remote priors. Cardiomegaly and vascular pedicle widening. Extensive atherosclerotic calcification. Probable small left pleural effusion. No pneumothorax. Midthoracic compression fractures.  IMPRESSION: Stable cardiomegaly and interstitial opacity that could be inflammatory or congestive. Electronically Signed   By: Monte Fantasia M.D.   On: 04/17/2020 07:11    Cardiac Studies   2D echo  04/16/2020 IMPRESSIONS    1. Left ventricular ejection fraction, by estimation, is 50 to 55%. The  left ventricle has low normal function. The left ventricle has no regional  wall motion abnormalities. There is mild concentric left ventricular  hypertrophy. Left ventricular  diastolic parameters are indeterminate.  2. Right ventricular systolic function is mildly reduced. The right  ventricular size is normal. There is moderately elevated pulmonary artery  systolic pressure.  3. Left atrial size was severely dilated.  4. Right atrial size was severely dilated.  5. The mitral valve is normal in structure. Mild mitral valve  regurgitation. No evidence of mitral stenosis.  6. Tricuspid valve regurgitation is moderate to severe.  7. The aortic valve is normal in structure. Aortic valve regurgitation is  not visualized. No aortic stenosis is present.  8. The inferior vena cava is normal in size with greater than 50%  respiratory variability, suggesting right atrial pressure of 3 mmHg.   Patient Profile     86 y.o. female with a hx of  AF on Eliquis, HTN, HLD, chronic diastolic CHF who is being seen today for the evaluation of atrial fibrillation and CHF exacerbation at the request of Dr. Doristine Bosworth.  Assessment & Plan    1.  Chronic atrial fibrillation now with RVR -she has had chronic afib managed with CCB and BB as well as Eliquis by PCP -TSH normal -Her HR remains elevated today in the 120's despite IV Cardizem gtt. Titrate for HR control -Stopped digoxin due to advanced age.   -Stopped amlodipine since she is already on CCB  -increase Toprol XL to 50mg  daily and titrate as needed for HR control.  She has already received her 25mg  dose so I will give an additional 25mg   now -Continue apixaban 2.5mg  BID (dosed for age>80, weight < 60kg).  -she has severe BAE on echo would likely not convert to NSR and maintain rhythm so will continue with rate control  2.  Acute on chronic diastolic CHF -2D echo this admit with EF 50-55% with mild  LVH -She has some mild volume overload with crackles on lung exam and mild LE edema -she is currently on IV Lasix and put out 800cc yesterday and is net net 2.4L -Creatinine improved with diuresis from 1.36>1.19.  -creatinine stable at 1.45 (1.19>1.43>1.45).   -continue Lasix 40mg  IV BID and follow strict I&O's, daily weights and renal function -likely exacerbated by elevated HR and needs good HR control and strict low Na diet  3.  Moderate pulmonary HTN -likely Group 2 related to pulmonary venous HTN from CHF -TR moderate to severe and RVF mildly reduced -continue IV diuretics  4.  HTN -BP stable at 130/98mmHg -continue Cardizem gtt and Toprol as HR control needed -stopped amlodipine as she is already on a CCB -holding Hydralazine for now as we need more BP to allow for uptitration of BB and CCB for HR control  5.  AKI on CKD stage 2 -Creatinine fairly stable with diuresis  (1.36>1.19>1.43>1.45) with diuresis -follow daily BMET    For questions or updates, please contact [place] Please consult [email or web address] for contact info under Cardiology/STEMI.      Signed, Fransico Him, MD  04/18/2020, 12:55 PM

## 2020-04-18 NOTE — Evaluation (Signed)
Occupational Therapy Evaluation Patient Details Name: Kelli Peterson MRN: [id number] DOB: 04/27/1930 Today's Date: 04/18/2020    History of Present Illness Pt is an 86 y/o female admitted secondary to palpitations and SOB x3 weeks. Pt found to have acute respiratory failure with hypoxemia and a-fib with RVR. PMH including but not limited to HTN, CHF, a-fib and cervical cancer.   Clinical Impression   PTA pt living with daughter and functioning at ~min A level. She reports using SPC vs RW at baseline for mobility, and needing assist from daughter for transfers/dressing/IADL management. At time of eval, pt presents with ability to complete bed mobility at min guard assist. She refused further mobility 2/2 fatigue from having sat in chair for 2 hours despite max encouragement and education. Noted cognitive deficits in command following, safety, and problem solving. Given current status, recommend SNF to support safety and BADL engagement prior to returning home. OT will continue to follow per POC listed below.   Follow Up Recommendations  SNF    Equipment Recommendations  None recommended by OT    Recommendations for Other Services       Precautions / Restrictions Precautions Precautions: Fall Restrictions Weight Bearing Restrictions: No      Mobility Bed Mobility Overal bed mobility: Needs Assistance Bed Mobility: Supine to Sit;Sit to Supine     Supine to sit: Min guard Sit to supine: Min guard   General bed mobility comments: increased time and effort, pt resistive to further mobility 2/2 fatigue  Transfers                      Balance                                           ADL either performed or assessed with clinical judgement   ADL Overall ADL's : Needs assistance/impaired Eating/Feeding: Set up;Sitting   Grooming: Set up;Sitting   Upper Body Bathing: Minimal assistance;Sitting   Lower Body Bathing: Maximal assistance;Sit to/from  stand;Sitting/lateral leans   Upper Body Dressing : Minimal assistance;Sitting   Lower Body Dressing: Maximal assistance;Sitting/lateral leans;Sit to/from stand   Toilet Transfer: Moderate assistance;Stand-pivot;BSC   Toileting- Clothing Manipulation and Hygiene: Maximal assistance;Sitting/lateral lean         General ADL Comments: pt limited by fatigue this date     Vision Patient Visual Report: No change from baseline       Perception     Praxis      Pertinent Vitals/Pain Pain Assessment: No/denies pain     Hand Dominance     Extremity/Trunk Assessment Upper Extremity Assessment Upper Extremity Assessment: Generalized weakness   Lower Extremity Assessment Lower Extremity Assessment: Generalized weakness       Communication Communication Communication: Expressive difficulties   Cognition Arousal/Alertness: Awake/alert Behavior During Therapy: WFL for tasks assessed/performed Overall Cognitive Status: Impaired/Different from baseline Area of Impairment: Following commands;Safety/judgement;Awareness;Problem solving                       Following Commands: Follows one step commands with increased time Safety/Judgement: Decreased awareness of deficits;Decreased awareness of safety Awareness: Emergent Problem Solving: Decreased initiation;Slow processing;Difficulty sequencing;Requires verbal cues General Comments: increased time and processing needed to follow basic commands.   General Comments       Exercises     Shoulder Instructions      Home  Living Family/patient expects to be discharged to:: Private residence Living Arrangements: Children (daughter) Available Help at Discharge: Family;Available PRN/intermittently Type of Home: House Home Access: Stairs to enter [employer] of Steps: 1   Home Layout: One level     Bathroom Shower/Tub:  (sponge bathes)   Bathroom Toilet: Standard     Home Equipment: Walker - 2  wheels;Cane - single point;Wheelchair - manual          Prior Functioning/Environment Level of Independence: Independent with assistive device(s)        Comments: uses cane vs walker at baseline        OT Problem List: Decreased strength;Decreased knowledge of use of DME or AE;Decreased activity tolerance;Cardiopulmonary status limiting activity;Impaired balance (sitting and/or standing);Decreased safety awareness      OT Treatment/Interventions: Self-care/ADL training;Therapeutic exercise;Patient/family education;Balance training;Energy conservation;Therapeutic activities;DME and/or AE instruction;Cognitive remediation/compensation    OT Goals(Current goals can be found in the care plan section) Acute Rehab OT Goals Patient Stated Goal: to rest OT Goal Formulation: With patient Time For Goal Achievement: 05/02/20 Potential to Achieve Goals: Good  OT Frequency: Min 2X/week   Barriers to D/C:            Co-evaluation              AM-PAC OT "6 Clicks" Daily Activity     Outcome Measure Help from another person eating meals?: A Little Help from another person taking care of personal grooming?: A Little Help from another person toileting, which includes using toliet, bedpan, or urinal?: A Lot Help from another person bathing (including washing, rinsing, drying)?: A Lot Help from another person to put on and taking off regular upper body clothing?: A Little Help from another person to put on and taking off regular lower body clothing?: A Lot 6 Click Score: 15   End of Session Nurse Communication: Mobility status  Activity Tolerance: Patient limited by fatigue Patient left: in bed;with call bell/phone within reach  OT Visit Diagnosis: Unsteadiness on feet (R26.81);Other abnormalities of gait and mobility (R26.89);Muscle weakness (generalized) (M62.81)                Time: 7471-8550 OT Time Calculation (min): 12 min Charges:  OT General Charges $OT Visit: 1  Visit OT Evaluation $OT Eval Moderate Complexity: Thomas, MSOT, OTR/L [facility] Office Number: [phone] Pager: [phone]  Zenovia Jarred 04/18/2020, 5:58 PM

## 2020-04-18 NOTE — Progress Notes (Signed)
   04/18/20 1104  Clinical Encounter Type  Visited With Patient and family together  Visit Type Initial (Advance Directive)  Referral From Physician  Consult/Referral To Chaplain  This chaplain responded to consult for spiritual care.  The Pt. shared her interest in completing HCPOA and Living Will.  The Pt. daughter-[name] is bedside. The Pt. communicated her choice of HCPOA as Debbe Odea. The Advance Directive education was completed. The Pt. prepared both documents with the chaplain.  Volunteer services is not available at this time to witness the signing of documents.  The chaplain will F/U after noon today. The Pt. incomplete document was left in the Pt. room with [name].

## 2020-04-18 NOTE — Progress Notes (Signed)
PROGRESS NOTE    Kelli Peterson  DXA:[id number] DOB: 1930-02-24 DOA: 04/15/2020 PCP: Jani Gravel, MD  Brief Narrative:  HPI per Dr. Early Osmond on 04/15/20 Kelli Peterson is a 86 y.o. female with medical history significant of A. fib-on Eliquis, hypertension, hyperlipidemia, chronic diastolic CHF presents to emergency department with palpitation and shortness of breath since 3 weeks.  Patient's daughter at the bedside is the story and.  She tells me that patient has palpitation and shortness of breath since 3 weeks.  She was seen by her PCP and diltiazem dose was increased and and on follow-up appointment digoxin was added yesterday however due to persistent symptoms patient came to ER for further evaluation and management.  Patient's daughter reports leg swelling, weight gain, shortness of breath and decreased oxygen saturation in 70s and 80s.  Patient is not on home oxygen.  Patient cannot lie flat due to dizziness, she sleeps on a recliner.  She has chronic cough however denies wheezing, change in sputum color, fever, chills, nausea, vomiting, diarrhea, headache, blurry vision, chest pain, urinary or bowel changes.  She lives with her daughter at home.  Uses walker for ambulation.  No history of smoking, alcohol, street drug use.    ED Course: Upon arrival to ED: Patient's heart rate noted to be in the 130s, tachypneic, requiring 3 L of oxygen via nasal cannula.,  Afebrile with no leukocytosis, BNP: 528, digoxin level: 0.7, lactic acid, COVID-19, blood culture: Pending.  Chest x-ray shows interstitial prominence throughout both lungs, increased as compared to the prior exam.  Findings are nonspecific but may reflect interstitial edema or atypical/viral pneumonia.  Unchanged cardiomegaly.  Patient received metoprolol 5 mg, Lasix 20 mg and Rocephin and azithromycin in ED.  EDP consulted cardiology.  [facility] consulted for admission for A. fib with RVR and new onset acute hypoxemic  respiratory failure.  **Interim History  She continues to remain slightly dyspneic and has been wearing oxygen.  Family states that they have noticed worsening swelling.  Has never seen a cardiologist but has had PCP try and manage.  Echocardiogram has been done and shows a left ventricular ejection fraction of 50 to 55% with the left ventricle having a low normal function there is mild concentric left ventricular hypertrophy.  Left ventricular diastolic parameters were indeterminate and she has a right ventricular size function that is mildly reduced with severely dilated left atrial size as well as right atrial size and tricuspid regurgitation is moderate to severe.  Cardiology evaluated and feels that they will not be able to convert her to normal sinus rhythm and maintain the rhythm so they can recommend rate control given her severe bilateral atrial enlargement.  Cardiology has adjusted her medications and they recommend continuing the Cardizem drip and restarting Toprol-XL 25 mg p.o. daily and titrate as needed for heart rate control.  Cardiology also recommends continue IV diuresis with Lasix for now.  She continues to be volume overloaded so they are recommending continue IV diuresis and because her heart rates still in the 120s the diltiazem drip is going to be adjusted and she will be increased on her Toprol-XL to 50 mg p.o. daily.  PT OT recommending SNF when she is stable for discharge   Assessment & Plan:   Principal Problem:   Acute hypoxemic respiratory failure ([name]) Active Problems:   Atrial fibrillation with RVR (HCC)   Essential hypertension   CKD (chronic kidney disease), stage III   Pressure injury of skin  Acute Respiratory Failure with Hypoxia Secondary to acute on chronic diastolic CHF/ less likely pneumonia? -Patient is requiring 3 L of oxygen via nasal cannula and will attempt to wean when she becomes less volume overloaded.   -She continues leg swelling and weight gain  and some orthopnea.  -Reviewed chest x-ray.  BNP: 528.  COVID-19 Negative . -Patient received Lasix 20 mg, Rocephin and azithromycin in ED. -Admit patient at stepdown unit for close monitoring.  -SpO2: 94 % O2 Flow Rate (L/min): 3 L/min -On continuous pulse ox.  We will try to wean off of oxygen as tolerated. -Start on Lasix 40 IV twice every 12 hours and will continue again today.  Strict INO's and daily weight. -Patient is -2.411 L since admission and weight is down 3 pounds -No transthoracic echo in the system but have ordered one and has been done -Check TSH and was 1.144.  Check procalcitonin level and was 1.23 and trended down to 0.51 -Hold off Further  antibiotics at this time-as chest x-ray findings are likely secondary to fluid overload as patient has been afebrile and has no leukocytosis -Monitor electrolytes and kidney function closely. -DuoNebs as needed but may need to change to Xopenex/Atrovent given her A. fib -Continue to monitor for signs and symptoms of volume overload -We will need an ambulatory O2 screen prior to discharge as well as a PT OT evaluation. -Continue diuresis per cardiology and they have also changed some medications around -Chest x-ray today showed stable cardiomegaly and interstitial opacity that could be inflammatory or congestive. -Cardiology feels that her diastolic CHF is likely exacerbated by her elevated heart rate and they feel that she needs good heart rate control and strict low-sodium diet -See below for further treatment  Persistent A. Fib with RVR -Received metoprolol 5 mg once in ED. -Recently has been seeing her PCP who have been adjusting medication and she has had prior to admission medications with metoprolol XL 25 mg p.o. daily, diltiazem 180 mg p.o. daily, hydroxyzine 0.125 mg daily and she is currently anticoagulated with low-dose Eliquis -In the ED she was given IV metoprolol and IV diltiazem. -Continue with anticoagulation with  low-dose Eliquis -Cardiology recommending holding hydralazine and amlodipine to allow for greater blood pressure and continue diltiazem for now and titrate stop transition to p.o. -CHA2DS2-VASc is at least a 5 for Age, Sex, HTN, and CHF Hx -Monitor heart rate closely as she continues to have heart rates up to the 120s -EDP consulted Cardiology and they recommend continuing diltiazem for now and titrating as blood pressure allows and once heart rate is optional transitioning to p.o.   -Cardiology also recommend restarting Toprol-XL 25 mg p.o. daily and titrating as needed for heart rate control as well as continuing the Cardizem gtt. Today they have increased her Toprol-XL to 50 mg p.o. daily and her uptitrating her Cardizem drip -Appreciate further care per cardiology and they stopped the digoxin due to advanced age and stop amlodipine as she is already on a calcium channel blocker with the IV Cardizem; they feel will be very difficult to convert to normal sinus rhythm and maintain rhythm given her severe bilateral atrial enlargement so they would recommend continue rate control -Cardiology still working on controlling her heart rate and she is currently not stable for discharge  Acquired Thrombophilia -CHA2DS2-VASc is at least a 5 for Age, Sex, HTN, and CHF Hx -Continue with low-dose Eliquis 2.5 mg p.o. twice daily given that her age is above 56 and that  her weight is below 60 kg  Hypertension -On the softer side but now improved -Continue diltiazem drip -Cardiology recommending holding hydralazine and amlodipine to allow for greater blood pressure -Monitor blood pressure closely as last blood pressure was 130/82  Hyperlipidemia -Continue Atorvastatin 20 mg po Daily   Pulmonary Hypertension  -Moderate and likely group 2 related to pulmonary versus hypotension from CHF -She has moderate to severe tricuspid regurg and a right ventricular function is mildly reduced -Cardiology recommending  continue IV diuretics when she is getting IV 40 twice daily  Metabolic Acidosis, improved -The patient's CO2 is 21, anion gap is 14, chloride level is 104; now her CO2 is 26, anion gap is 10, chloride level is 103 -We will continue to monitor and trend and repeat CMP in a.m.  Normocytic Anemia -H&H: 10.1/32.5 on admission was 13.2/41.28 months ago; repeat this morning globin of 9.5 and hematocrit 30.5 -No active bleeding.  Monitor H&H closely.  Transfuse as needed. -Check anemia panel and it showed an iron level of 20, U IBC 239, TIBC 259, saturation ratios of 8%, ferritin level of 62, folate of 26.5, vitamin B12 470 -Continue to monitor for signs and symptoms of bleeding; currently no overt bleeding with  -Repeat CBC in a.m.  AKI on CKD stage IIIb -Creatinine: 1.36, GFR: 35 (previous creatinine: 1.89, GFR: 58) -Was improving with diuresis and now BUN/creatinine slightly trended up and has been stable for last day or so and her BUN/creatinine is 25/1.4 -Continue to avoid nephrotoxic medications, contrast dyes, hypotension and renally adjust medications -Repeat CMP in a.m.  DVT prophylaxis: Anticoagulated with Apixaban 2.5 mg po BID  Code Status: FULL CODE  Family Communication: No family present at bedside Disposition Plan: Pending cardiac clearance and titration off of Cardizem gtt; she will need SNF  Status is: Inpatient  Remains inpatient appropriate because:Ongoing diagnostic testing needed not appropriate for outpatient work up, Unsafe d/c plan, IV treatments appropriate due to intensity of illness or inability to take PO and Inpatient level of care appropriate due to severity of illness   Dispo: The patient is from: Home              Anticipated d/c is to: TBD              Anticipated d/c date is: 2 days              Patient currently is not medically stable to d/c.  Consultants:   Cardiology   Procedures: ECHOCARDIOGRAM IMPRESSIONS    1. Left ventricular ejection  fraction, by estimation, is 50 to 55%. The  left ventricle has low normal function. The left ventricle has no regional  wall motion abnormalities. There is mild concentric left ventricular  hypertrophy. Left ventricular  diastolic parameters are indeterminate.  2. Right ventricular systolic function is mildly reduced. The right  ventricular size is normal. There is moderately elevated pulmonary artery  systolic pressure.  3. Left atrial size was severely dilated.  4. Right atrial size was severely dilated.  5. The mitral valve is normal in structure. Mild mitral valve  regurgitation. No evidence of mitral stenosis.  6. Tricuspid valve regurgitation is moderate to severe.  7. The aortic valve is normal in structure. Aortic valve regurgitation is  not visualized. No aortic stenosis is present.  8. The inferior vena cava is normal in size with greater than 50%  respiratory variability, suggesting right atrial pressure of 3 mmHg.   FINDINGS  Left Ventricle: Left ventricular  ejection fraction, by estimation, is 50  to 55%. The left ventricle has low normal function. The left ventricle has  no regional wall motion abnormalities. The left ventricular internal  cavity size was normal in size.  There is mild concentric left ventricular hypertrophy. Left ventricular  diastolic parameters are indeterminate.   Right Ventricle: The right ventricular size is normal. No increase in  right ventricular wall thickness. Right ventricular systolic function is  mildly reduced. There is moderately elevated pulmonary artery systolic  pressure. The tricuspid regurgitant  velocity is 3.30 m/s, and with an assumed right atrial pressure of 3 mmHg,  the estimated right ventricular systolic pressure is 62.7 mmHg.   Left Atrium: Left atrial size was severely dilated.   Right Atrium: Right atrial size was severely dilated.   Pericardium: There is no evidence of pericardial effusion.   Mitral Valve:  The mitral valve is normal in structure. Normal mobility of  the mitral valve leaflets. Mild mitral annular calcification. Mild mitral  valve regurgitation. No evidence of mitral valve stenosis.   Tricuspid Valve: The tricuspid valve is normal in structure. Tricuspid  valve regurgitation is moderate to severe. No evidence of tricuspid  stenosis.   Aortic Valve: The aortic valve is normal in structure.. There is moderate  thickening and moderate calcification of the aortic valve. Aortic valve  regurgitation is not visualized. No aortic stenosis is present. There is  moderate thickening of the aortic  valve. There is moderate calcification of the aortic valve. Aortic valve  mean gradient measures 4.0 mmHg. Aortic valve peak gradient measures 7.2  mmHg. Aortic valve area, by VTI measures 1.20 cm.   Pulmonic Valve: The pulmonic valve was normal in structure. Pulmonic valve  regurgitation is not visualized. No evidence of pulmonic stenosis.   Aorta: The aortic root is normal in size and structure.   Venous: The inferior vena cava is normal in size with greater than 50%  respiratory variability, suggesting right atrial pressure of 3 mmHg.   IAS/Shunts: No atrial level shunt detected by color flow Doppler.     LEFT VENTRICLE  PLAX 2D  LVIDd:     3.80 cm  LVIDs:     2.80 cm  LV PW:     1.20 cm  LV IVS:    1.37 cm  LVOT diam:   1.80 cm  LV SV:     32  LV SV Index:  22  LVOT Area:   2.54 cm     RIGHT VENTRICLE      IVC  RV Basal diam: 3.60 cm  IVC diam: 1.00 cm  RV Mid diam:  2.90 cm  RV S prime:   5.43 cm/s  TAPSE (M-mode): 0.9 cm   LEFT ATRIUM       Index    RIGHT ATRIUM      Index  LA diam:    3.80 cm 2.60 cm/m RA Area:   24.10 cm  LA Vol (A2C):  62.6 ml 42.84 ml/m RA Volume:  67.90 ml 46.46 ml/m  LA Vol (A4C):  50.0 ml 34.22 ml/m  LA Biplane Vol: 57.6 ml 39.42 ml/m  AORTIC VALVE  AV Area (Vmax):   1.43 cm  AV Area (Vmean):  1.22 cm  AV Area (VTI):   1.20 cm  AV Vmax:      134.00 cm/s  AV Vmean:     97.300 cm/s  AV VTI:      0.269 m  AV Peak Grad:   7.2  mmHg  AV Mean Grad:   4.0 mmHg  LVOT Vmax:     75.10 cm/s  LVOT Vmean:    46.500 cm/s  LVOT VTI:     0.127 m  LVOT/AV VTI ratio: 0.47    AORTA  Ao Root diam: 3.10 cm  Ao Asc diam: 3.00 cm   TRICUSPID VALVE  TR Peak grad:  43.6 mmHg  TR Vmax:    330.00 cm/s    SHUNTS  Systemic VTI: 0.13 m  Systemic Diam: 1.80 cm   Antimicrobials:  Anti-infectives (From admission, onward)   Start     Dose/Rate Route Frequency Ordered Stop   04/15/20 1630  cefTRIAXone (ROCEPHIN) 1 g in sodium chloride 0.9 % 100 mL IVPB        1 g 200 mL/hr over 30 Minutes Intravenous  Once 04/15/20 1620 04/15/20 1920   04/15/20 1630  azithromycin (ZITHROMAX) 500 mg in sodium chloride 0.9 % 250 mL IVPB        500 mg 250 mL/hr over 60 Minutes Intravenous  Once 04/15/20 1620 04/15/20 2046      Subjective: Seen and examined at bedside and she is a little sleepy but she remains hard of hearing.  Denies any complaints or pain.  Ate her breakfast without issues.  Denies any shortness of breath.  No nausea or vomiting.  Heart rate still elevated.  No other concerns or complaints at this time and no family currently at bedside.  Objective: Vitals:   04/18/20 0058 04/18/20 0413 04/18/20 0733 04/18/20 1225  BP: 116/76 114/80 118/89 130/82  Pulse: 87 66 (!) 101 (!) 102  Resp: 15 18 17 17   Temp:  98 F (36.7 C) 97.6 F (36.4 C) 98.6 F (37 C)  TempSrc: Axillary Axillary Oral Oral  SpO2: 97% 95% 94% 94%  Weight:  44.3 kg    Height:        Intake/Output Summary (Last 24 hours) at 04/18/2020 1412 Last data filed at 04/18/2020 0500 Gross per 24 hour  Intake 3 ml  Output 800 ml  Net -797 ml   Filed Weights   04/17/20 0118 04/18/20 0413  Weight: 45.4 kg 44.3 kg   Examination: Physical Exam:   Constitutional: WN/WD thin elderly Caucasian female currently in no acute distress appears calm and is slightly somnolent but easily arousable. Eyes: Lids and conjunctivae normal, sclerae anicteric  ENMT: External Ears, Nose appear normal.  She is hard of hearing Neck: Appears normal, supple, no cervical masses, normal ROM, no appreciable thyromegaly; no JVD Respiratory: Diminished to auscultation bilaterally with coarse breath sounds with some slight crackles but no appreciable wheezing, rales, rhonchi.  Patient has unlabored breathing but continues to wear supplemental oxygen via nasal cannula. Cardiovascular: Irregularly irregular and tachycardic, no murmurs / rubs / gallops. S1 and S2 auscultated.  Continues to 1+ lower extremity edema Abdomen: Soft, non-tender, non-distended. Bowel sounds positive.  GU: Deferred. Musculoskeletal: No clubbing / cyanosis of digits/nails. No joint deformity upper and lower extremities.   Skin: No rashes, lesions, ulcers on limited skin evaluation. No induration; Warm and dry.  Neurologic: CN 2-12 grossly intact with no focal deficits. Romberg sign and cerebellar reflexes not assessed.  Psychiatric: Slightly impaired judgment and insight.  Slightly somnolent and little drowsy but easily arousable.. Normal mood and appropriate affect.   Data Reviewed: I have personally reviewed following labs and imaging studies  CBC: Recent Labs  Lab 04/15/20 1541 04/17/20 0846 04/18/20 0319  WBC 9.0 10.0 9.4  NEUTROABS 7.8*  8.6* 7.3  HGB 10.1* 9.5* 9.5*  HCT 32.5* 30.5* 29.6*  MCV 84.0 83.3 81.8  PLT 374 295 993   Basic Metabolic Panel: Recent Labs  Lab 04/15/20 1622 04/16/20 0330 04/17/20 0846 04/18/20 0319  NA 139 139 139 137  K 4.2 4.0 3.5 3.6  CL 106 104 103 98  CO2 22 21* 26 30  GLUCOSE 108* 95 91 101*  BUN 15 14 18  25*  CREATININE 1.36* 1.19* 1.43* 1.45*  CALCIUM 8.6* 8.2* 8.1* 8.2*  MG 2.5*  --  1.8 1.7  PHOS  --   --  3.5 3.7   GFR: Estimated  Creatinine Clearance: 19.1 mL/min (A) (by C-G formula based on SCr of 1.45 mg/dL (H)). Liver Function Tests: Recent Labs  Lab 04/15/20 1622 04/17/20 0846 04/18/20 0319  AST 22 18 19   ALT 18 15 15   ALKPHOS 70 57 52  BILITOT 0.6 0.5 0.7  PROT 7.2 6.3* 6.3*  ALBUMIN 2.5* 2.3* 2.2*   No results for input(s): LIPASE, AMYLASE in the last 168 hours. No results for input(s): AMMONIA in the last 168 hours. Coagulation Profile: Recent Labs  Lab 04/15/20 1629  INR 1.5*   Cardiac Enzymes: No results for input(s): CKTOTAL, CKMB, CKMBINDEX, TROPONINI in the last 168 hours. BNP (last 3 results) No results for input(s): PROBNP in the last 8760 hours. HbA1C: No results for input(s): HGBA1C in the last 72 hours. CBG: No results for input(s): GLUCAP in the last 168 hours. Lipid Profile: No results for input(s): CHOL, HDL, LDLCALC, TRIG, CHOLHDL, LDLDIRECT in the last 72 hours. Thyroid Function Tests: Recent Labs    04/15/20 2044  TSH 1.144   Anemia Panel: Recent Labs    04/17/20 0846  VITAMINB12 470  FOLATE 26.5  FERRITIN 62  TIBC 259  IRON 20*  RETICCTPCT 1.9   Sepsis Labs: Recent Labs  Lab 04/15/20 2044 04/15/20 2055 04/16/20 0330 04/17/20 0846  PROCALCITON 1.23  --  1.06 0.51  LATICACIDVEN 1.2 0.7  --   --     Recent Results (from the past 240 hour(s))  Blood culture (routine x 2)     Status: None (Preliminary result)   Collection Time: 04/15/20  4:25 PM   Specimen: BLOOD RIGHT HAND  Result Value Ref Range Status   Specimen Description BLOOD RIGHT HAND  Final   Special Requests   Final    BOTTLES DRAWN AEROBIC AND ANAEROBIC Blood Culture results may not be optimal due to an inadequate volume of blood received in culture bottles   Culture   Final    NO GROWTH 2 DAYS Performed at [facility], [place] [address], [place], [place] [address]    Report Status PENDING  Incomplete  Blood culture (routine x 2)     Status: None (Preliminary result)   Collection  Time: 04/15/20  4:40 PM   Specimen: BLOOD RIGHT ARM  Result Value Ref Range Status   Specimen Description BLOOD RIGHT ARM  Final   Special Requests   Final    BOTTLES DRAWN AEROBIC AND ANAEROBIC Blood Culture results may not be optimal due to an excessive volume of blood received in culture bottles   Culture   Final    NO GROWTH 2 DAYS Performed at [facility], [place] [address], [place], [place] [address]    Report Status PENDING  Incomplete  SARS Coronavirus 2 by RT PCR (hospital order, performed in [facility] hospital lab) Nasopharyngeal Nasopharyngeal Swab  Status: None   Collection Time: 04/15/20  9:52 PM   Specimen: Nasopharyngeal Swab  Result Value Ref Range Status   SARS Coronavirus 2 NEGATIVE NEGATIVE Final    Comment: (NOTE) SARS-CoV-2 target nucleic acids are NOT DETECTED.  The SARS-CoV-2 RNA is generally detectable in upper and lower respiratory specimens during the acute phase of infection. The lowest concentration of SARS-CoV-2 viral copies this assay can detect is 250 copies / mL. A negative result does not preclude SARS-CoV-2 infection and should not be used as the sole basis for treatment or other patient management decisions.  A negative result may occur with improper specimen collection / handling, submission of specimen other than nasopharyngeal swab, presence of viral mutation(s) within the areas targeted by this assay, and inadequate number of viral copies (<250 copies / mL). A negative result must be combined with clinical observations, patient history, and epidemiological information.  Fact Sheet for Patients:   StrictlyIdeas.no  Fact Sheet for Healthcare Providers: BankingDealers.co.za  This test is not yet approved or  cleared by the [place] FDA and has been authorized for detection and/or diagnosis of SARS-CoV-2 by FDA under an Emergency Use Authorization (EUA).  This EUA will remain in  effect (meaning this test can be used) for the duration of the COVID-19 declaration under Section 564(b)(1) of the Act, 21 U.S.C. section 360bbb-3(b)(1), unless the authorization is terminated or revoked sooner.  Performed at [facility], [place] [address], [place], [place] [address]      RN Pressure Injury Documentation: Pressure Injury 04/17/20 Buttocks Left Stage 2 -  Partial thickness loss of dermis presenting as a shallow open injury with a red, pink wound bed without slough. (Active)  04/17/20 0132  Location: Buttocks  Location Orientation: Left  Staging: Stage 2 -  Partial thickness loss of dermis presenting as a shallow open injury with a red, pink wound bed without slough.  Wound Description (Comments):   Present on Admission:      Estimated body mass index is 19.07 kg/m as calculated from the following:   Height as of this encounter: 5' (1.524 m).   Weight as of this encounter: 44.3 kg.  Malnutrition Type:  Nutrition Problem: Severe Malnutrition Etiology: chronic illness (CHF)   Malnutrition Characteristics:  Signs/Symptoms: severe muscle depletion, severe fat depletion   Nutrition Interventions:  Interventions: Ensure Enlive (each supplement provides 350kcal and 20 grams of protein), MVI, Magic cup Radiology Studies: DG CHEST PORT 1 VIEW  Result Date: 04/17/2020 CLINICAL DATA:  Acute hypoxic respiratory failure EXAM: PORTABLE CHEST 1 VIEW COMPARISON:  Two days ago FINDINGS: Interstitial coarsening which is essentially stable from most recent prior and accentuated compared to more remote priors. Cardiomegaly and vascular pedicle widening. Extensive atherosclerotic calcification. Probable small left pleural effusion. No pneumothorax. Midthoracic compression fractures. IMPRESSION: Stable cardiomegaly and interstitial opacity that could be inflammatory or congestive. Electronically Signed   By: Monte Fantasia M.D.   On: 04/17/2020 07:11   Scheduled Meds: .  apixaban  2.5 mg Oral BID  . atorvastatin  20 mg Oral Daily  . brimonidine  1 drop Right Eye BID  . feeding supplement (ENSURE ENLIVE)  237 mL Oral BID BM  . furosemide  40 mg Intravenous Q12H  . latanoprost  1 drop Both Eyes Q1200  . mouth rinse  15 mL Mouth Rinse BID  . metoprolol succinate  25 mg Oral Once  . [START ON 04/19/2020] metoprolol succinate  50 mg Oral Daily  . multivitamin with  minerals  1 tablet Oral Daily  . Netarsudil Dimesylate  1 drop Right Eye Daily  . sodium chloride flush  3 mL Intravenous Q12H   Continuous Infusions: . sodium chloride    . diltiazem (CARDIZEM) infusion 12.5 mg/hr (04/17/20 2329)    LOS: 3 days   Kerney Elbe, DO [facility] PAGER is on [place]  If 7PM-7AM, please contact night-coverage [email or web address]

## 2020-04-18 NOTE — Progress Notes (Signed)
This chaplain coordinated the notary and witnesses for completing the Pt. HCPOA and Living Will. The Pt. named Kelli Peterson as Kelli Peterson [phone].  The original and one copy was given to the Pt. An additional copy of the Pt. AD was placed in the Pt. Chart.  F/U spiritual care is available as needed.

## 2020-04-18 NOTE — Care Management Important Message (Signed)
Important Message  Patient Details  Name: KHLOEI SPIKER MRN: [id number] Date of Birth: 1930-08-16   Medicare Important Message Given:  Yes     Shelda Altes 04/18/2020, 11:52 AM

## 2020-04-18 NOTE — TOC Progression Note (Addendum)
Transition of Care [facility]) - Progression Note    Patient Details  Name: Kelli Peterson MRN: [id number] Date of Birth: [birth year]  Transition of Care [facility]) CM/SW [place], [place] Phone Number: 04/18/2020, 3:51 PM  Clinical Narrative:     CSW spoke with patients daughter [name] who chose SNF placement for patient at [facility] in [place]. CSW spoke with Levada Dy from [facility] who confirmed that they can accept patient for SNF placement.   Patient has SNF bed at [facility] in [place]. Insurance authorization still pending Reference number is # [id number].  CSW will continue to follow.  Expected Discharge Plan: [place] Barriers to Discharge: Continued Medical Work up  Expected Discharge Plan and Services Expected Discharge Plan: [place] arrangements for the past 2 months: Single Family Home                                       Social Determinants of Health (SDOH) Interventions    Readmission Risk Interventions No flowsheet data found.

## 2020-04-19 ENCOUNTER — Inpatient Hospital Stay (HOSPITAL_COMMUNITY): Payer: Medicare Other

## 2020-04-19 DIAGNOSIS — I4891 Unspecified atrial fibrillation: Secondary | ICD-10-CM | POA: Diagnosis not present

## 2020-04-19 DIAGNOSIS — T17908A Unspecified foreign body in respiratory tract, part unspecified causing other injury, initial encounter: Secondary | ICD-10-CM

## 2020-04-19 DIAGNOSIS — I5033 Acute on chronic diastolic (congestive) heart failure: Secondary | ICD-10-CM

## 2020-04-19 DIAGNOSIS — N1831 Chronic kidney disease, stage 3a: Secondary | ICD-10-CM | POA: Diagnosis not present

## 2020-04-19 DIAGNOSIS — I10 Essential (primary) hypertension: Secondary | ICD-10-CM | POA: Diagnosis not present

## 2020-04-19 LAB — CBC WITH DIFFERENTIAL/PLATELET
Abs Immature Granulocytes: 0.12 10*3/uL — ABNORMAL HIGH (ref 0.00–0.07)
Basophils Absolute: 0 10*3/uL (ref 0.0–0.1)
Basophils Relative: 0 %
Eosinophils Absolute: 0 10*3/uL (ref 0.0–0.5)
Eosinophils Relative: 0 %
HCT: 29.8 % — ABNORMAL LOW (ref 36.0–46.0)
Hemoglobin: 9.6 g/dL — ABNORMAL LOW (ref 12.0–15.0)
Immature Granulocytes: 1 %
Lymphocytes Relative: 4 %
Lymphs Abs: 0.7 10*3/uL (ref 0.7–4.0)
MCH: 26.1 pg (ref 26.0–34.0)
MCHC: 32.2 g/dL (ref 30.0–36.0)
MCV: 81 fL (ref 80.0–100.0)
Monocytes Absolute: 0.9 10*3/uL (ref 0.1–1.0)
Monocytes Relative: 5 %
Neutro Abs: 15.8 10*3/uL — ABNORMAL HIGH (ref 1.7–7.7)
Neutrophils Relative %: 90 %
Platelets: 269 10*3/uL (ref 150–400)
RBC: 3.68 MIL/uL — ABNORMAL LOW (ref 3.87–5.11)
RDW: 18.1 % — ABNORMAL HIGH (ref 11.5–15.5)
WBC: 17.6 10*3/uL — ABNORMAL HIGH (ref 4.0–10.5)
nRBC: 0 % (ref 0.0–0.2)

## 2020-04-19 LAB — URINALYSIS, ROUTINE W REFLEX MICROSCOPIC
Bilirubin Urine: NEGATIVE
Glucose, UA: NEGATIVE mg/dL
Hgb urine dipstick: NEGATIVE
Ketones, ur: NEGATIVE mg/dL
Leukocytes,Ua: NEGATIVE
Nitrite: NEGATIVE
Protein, ur: NEGATIVE mg/dL
Specific Gravity, Urine: 1.008 (ref 1.005–1.030)
pH: 5 (ref 5.0–8.0)

## 2020-04-19 LAB — COMPREHENSIVE METABOLIC PANEL
ALT: 16 U/L (ref 0–44)
AST: 24 U/L (ref 15–41)
Albumin: 2.3 g/dL — ABNORMAL LOW (ref 3.5–5.0)
Alkaline Phosphatase: 52 U/L (ref 38–126)
Anion gap: 11 (ref 5–15)
BUN: 24 mg/dL — ABNORMAL HIGH (ref 8–23)
CO2: 31 mmol/L (ref 22–32)
Calcium: 8.7 mg/dL — ABNORMAL LOW (ref 8.9–10.3)
Chloride: 95 mmol/L — ABNORMAL LOW (ref 98–111)
Creatinine, Ser: 1.23 mg/dL — ABNORMAL HIGH (ref 0.44–1.00)
GFR calc Af Amer: 46 mL/min — ABNORMAL LOW (ref >60)
GFR calc non Af Amer: 39 mL/min — ABNORMAL LOW (ref >60)
Glucose, Bld: 107 mg/dL — ABNORMAL HIGH (ref 70–99)
Potassium: 3.2 mmol/L — ABNORMAL LOW (ref 3.5–5.1)
Sodium: 137 mmol/L (ref 135–145)
Total Bilirubin: 0.8 mg/dL (ref 0.3–1.2)
Total Protein: 6.3 g/dL — ABNORMAL LOW (ref 6.5–8.1)

## 2020-04-19 LAB — PHOSPHORUS: Phosphorus: 3.5 mg/dL (ref 2.5–4.6)

## 2020-04-19 LAB — MAGNESIUM: Magnesium: 1.7 mg/dL (ref 1.7–2.4)

## 2020-04-19 MED ORDER — MAGNESIUM SULFATE 2 GM/50ML IV SOLN
2.0000 g | Freq: Once | INTRAVENOUS | Status: AC
  Administered 2020-04-19: 2 g via INTRAVENOUS
  Filled 2020-04-19: qty 50

## 2020-04-19 MED ORDER — SODIUM CHLORIDE 0.9 % IV SOLN
3.0000 g | Freq: Two times a day (BID) | INTRAVENOUS | Status: AC
  Administered 2020-04-19 – 2020-04-23 (×9): 3 g via INTRAVENOUS
  Filled 2020-04-19 (×7): qty 3
  Filled 2020-04-19: qty 8
  Filled 2020-04-19: qty 3

## 2020-04-19 MED ORDER — METOPROLOL SUCCINATE ER 25 MG PO TB24
25.0000 mg | ORAL_TABLET | Freq: Once | ORAL | Status: AC
  Administered 2020-04-19: 25 mg via ORAL
  Filled 2020-04-19: qty 1

## 2020-04-19 MED ORDER — METOPROLOL SUCCINATE ER 50 MG PO TB24
75.0000 mg | ORAL_TABLET | Freq: Every day | ORAL | Status: DC
  Administered 2020-04-20 – 2020-04-24 (×5): 75 mg via ORAL
  Filled 2020-04-19 (×5): qty 1

## 2020-04-19 MED ORDER — POTASSIUM CHLORIDE CRYS ER 20 MEQ PO TBCR
40.0000 meq | EXTENDED_RELEASE_TABLET | Freq: Two times a day (BID) | ORAL | Status: AC
  Administered 2020-04-19 (×2): 40 meq via ORAL
  Filled 2020-04-19 (×2): qty 2

## 2020-04-19 MED ORDER — RESOURCE THICKENUP CLEAR PO POWD
ORAL | Status: DC | PRN
  Filled 2020-04-19: qty 125

## 2020-04-19 NOTE — Progress Notes (Signed)
PROGRESS NOTE    Kelli Peterson  ENI:[id number] DOB: 09/20/1930 DOA: 04/15/2020 PCP: Jani Gravel, MD  Brief Narrative:  HPI per Dr. Early Osmond on 04/15/20 Kelli Peterson is a 86 y.o. female with medical history significant of A. fib-on Eliquis, hypertension, hyperlipidemia, chronic diastolic CHF presents to emergency department with palpitation and shortness of breath since 3 weeks.  Patient's daughter at the bedside is the story and.  She tells me that patient has palpitation and shortness of breath since 3 weeks.  She was seen by her PCP and diltiazem dose was increased and and on follow-up appointment digoxin was added yesterday however due to persistent symptoms patient came to ER for further evaluation and management.  Patient's daughter reports leg swelling, weight gain, shortness of breath and decreased oxygen saturation in 70s and 80s.  Patient is not on home oxygen.  Patient cannot lie flat due to dizziness, she sleeps on a recliner.  She has chronic cough however denies wheezing, change in sputum color, fever, chills, nausea, vomiting, diarrhea, headache, blurry vision, chest pain, urinary or bowel changes.  She lives with her daughter at home.  Uses walker for ambulation.  No history of smoking, alcohol, street drug use.    ED Course: Upon arrival to ED: Patient's heart rate noted to be in the 130s, tachypneic, requiring 3 L of oxygen via nasal cannula.,  Afebrile with no leukocytosis, BNP: 528, digoxin level: 0.7, lactic acid, COVID-19, blood culture: Pending.  Chest x-ray shows interstitial prominence throughout both lungs, increased as compared to the prior exam.  Findings are nonspecific but may reflect interstitial edema or atypical/viral pneumonia.  Unchanged cardiomegaly.  Patient received metoprolol 5 mg, Lasix 20 mg and Rocephin and azithromycin in ED.  EDP consulted cardiology.  [facility] hospitalist consulted for admission for A. fib with RVR and new onset acute hypoxemic  respiratory failure.  **Interim History  She continues to remain slightly dyspneic and has been wearing oxygen.  Family states that they have noticed worsening swelling.  Has never seen a cardiologist but has had PCP try and manage.  Echocardiogram has been done and shows a left ventricular ejection fraction of 50 to 55% with the left ventricle having a low normal function there is mild concentric left ventricular hypertrophy.  Left ventricular diastolic parameters were indeterminate and she has a right ventricular size function that is mildly reduced with severely dilated left atrial size as well as right atrial size and tricuspid regurgitation is moderate to severe.  Cardiology evaluated and feels that they will not be able to convert her to normal sinus rhythm and maintain the rhythm so they can recommend rate control given her severe bilateral atrial enlargement.  Cardiology has adjusted her medications and they recommend continuing the Cardizem drip and restarting Toprol-XL 25 mg p.o. daily and titrate as needed for heart rate control.  Cardiology also recommends continue IV diuresis with Lasix for now.  She continues to be volume overloaded so they are recommending continue IV diuresis and because her heart rates still in the 120s the diltiazem drip is going to be adjusted and she will be increased on her Toprol-XL to 50 mg p.o. daily.  PT OT recommending SNF when she is stable for discharge  04/19/2020 Patient's heart rate is improved today but her white blood cell count went up significantly and patient has been noted to be coughing when eating meals and drinking and there is concern for aspiration so an SLP evaluation was done.  Panculture was also initiated given her elevated WBC.  We will obtain a chest x-ray, urine culture and urinalysis as well as blood cultures.  We will hold off on empiric antibiotics at this time and continue to monitor her WBC.  Speech therapy evaluated and recommending a  dysphagia 2 diet with nectar thick liquids.  Cardiology further increased Toprol-XL to 75 mg p.o. daily and continue to recommending diuresis  Assessment & Plan:   Principal Problem:   Acute hypoxemic respiratory failure (HCC) Active Problems:   Atrial fibrillation with RVR (HCC)   Essential hypertension   CKD (chronic kidney disease), stage III   Pressure injury of skin   Acute on chronic diastolic heart failure (HCC)  Acute Respiratory Failure with Hypoxia Secondary to acute on chronic diastolic CHF/ less likely pneumonia?,  But patient may have aspirated -Patient is requiring 3 L of oxygen via nasal cannula and will attempt to wean when she becomes less volume overloaded.   -She continues leg swelling and weight gain and some orthopnea.  -Reviewed chest x-ray.  BNP: 528.  COVID-19 Negative . -Patient received Lasix 20 mg, Rocephin and azithromycin in ED. -Admit patient at stepdown unit for close monitoring.  -SpO2: 94 % O2 Flow Rate (L/min): 3 L/min -On continuous pulse ox.  We will try to wean off of oxygen as tolerated. -Start on Lasix 40 IV twice every 12 hours and will continue again today per cardiology Rex.  Strict INO's and daily weight. -Patient is - 4.361 L since admission and weight is down 3 pounds but not repeated today -No transthoracic echo in the system but have ordered one and has been done -Check TSH and was 1.144.  Check procalcitonin level and was 1.23 and trended down to 0.51 -Hold off Further  antibiotics at this time-as chest x-ray findings are likely secondary to fluid overload as patient has been afebrile; she did spike a WBC and WBC went from 9.4 is now 17.6 we will pan-culture -Chest x-ray today showed "Stable cardiomegaly. Stable interstitial densities are noted throughout both lungs which may represent edema or possibly atypical inflammation. Small left pleural effusion is noted. Aortic Atherosclerosis."  -We will empirically start the patient on IV Unasyn  given her concern for aspiration and will continue for 5 days; blood cultures initially done showed no growth at 4 days and repeat today given concern for aspiration elevated white blood cell count is still pending -Urinalysis was unremarkable -If WBC persist may consider obtaining a CT scan of the chest -Monitor electrolytes and kidney function closely. -SLP evaluated and recommending dysphagia 2 diet with nectar thick liquids -DuoNebs as needed but may need to change to Xopenex/Atrovent given her A. fib -Continue to monitor for signs and symptoms of volume overload -We will need an ambulatory O2 screen prior to discharge as well as a PT OT evaluation. -Continue diuresis per cardiology and they have also changed some medications around and increased her metoprolol -Cardiology feels that her diastolic CHF is likely exacerbated by her elevated heart rate and they feel that she needs good heart rate control and strict low-sodium diet -See below for further treatment  Persistent A. Fib with RVR -Received metoprolol 5 mg once in ED. -Recently has been seeing her PCP who have been adjusting medication and she has had prior to admission medications with metoprolol XL 25 mg p.o. daily, diltiazem 180 mg p.o. daily, hydroxyzine 0.125 mg daily and she is currently anticoagulated with low-dose Eliquis -In the ED she was  given IV metoprolol and IV diltiazem. -Continue with anticoagulation with low-dose Eliquis -Cardiology recommending holding hydralazine and amlodipine to allow for greater blood pressure and continue diltiazem for now and titrate stop transition to p.o. -CHA2DS2-VASc is at least a 5 for Age, Sex, HTN, and CHF Hx -Monitor heart rate closely as she continued to have heart rates in the 120s however it is better today with heart rates in the 80s and 90s -EDP consulted Cardiology and they recommend continuing diltiazem for now and titrating as blood pressure allows and once heart rate is  optional transitioning to p.o. ; she is to remain on the Cardizem drip today -Cardiology also recommend restarting Toprol-XL 25 mg p.o. daily and now further titrated up to 75 mg p.o. daily -Appreciate further care per cardiology and they stopped the digoxin due to advanced age and stop amlodipine as she is already on a calcium channel blocker with the IV Cardizem; they feel will be very difficult to convert to normal sinus rhythm and maintain rhythm given her severe bilateral atrial enlargement so they would recommend continue rate control -Cardiology still working on controlling her heart rate and she is currently not stable for discharge  Acquired Thrombophilia -CHA2DS2-VASc is at least a 5 for Age, Sex, HTN, and CHF Hx -Continue with low-dose Eliquis 2.5 mg p.o. twice daily given that her age is above 4 and that her weight is below 60 kg  Hypertension -On the softer side but now improved -Continue diltiazem drip -Cardiology recommending holding hydralazine and amlodipine to allow for greater blood pressure -Monitor blood pressure closely as last blood pressure was 117/75  Hyperlipidemia -Continue Atorvastatin 20 mg po Daily   Pulmonary Hypertension  -Moderate and likely group 2 related to pulmonary versus hypotension from CHF -She has moderate to severe tricuspid regurg and a right ventricular function is mildly reduced -Cardiology recommending continue IV diuretics when she is getting IV 40 twice daily  Metabolic Acidosis, improved -The patient's CO2 is 21, anion gap is 14, chloride level is 104; her CO2 is now 31, chloride level is 95, anion gap is 11 -We will continue to monitor and trend and repeat CMP in a.m.  Normocytic Anemia -H&H: 10.1/32.5 on admission was 13.2/41.28 months ago; repeat showed a hemoglobin/hematocrit of 9.6/29.8 today -No active bleeding.  Monitor H&H closely.  Transfuse as needed. -Check anemia panel and it showed an iron level of 20, U IBC 239, TIBC  259, saturation ratios of 8%, ferritin level of 62, folate of 26.5, vitamin B12 470 -Continue to monitor for signs and symptoms of bleeding; currently no overt bleeding with  -Repeat CBC in a.m.  AKI on CKD stage IIIb, stable and improving -Creatinine: 1.36, GFR: 35 (previous creatinine: 1.89, GFR: 58) -WBC is now 24/1.23 -Continue to avoid nephrotoxic medications, contrast dyes, hypotension and renally adjust medications -Repeat CMP in a.m.  Hypokalemia -The patient potassium this morning is 3.2 and the setting of IV Lasix -Replete with potassium chloride 4 mg twice daily x2 doses -Continue to monitor and replete as necessary -Repeat CMP in a.m.  DVT prophylaxis: Anticoagulated with Apixaban 2.5 mg po BID  Code Status: FULL CODE  Family Communication: Daughter was at bedside Disposition Plan: Pending cardiac clearance and titration off of Cardizem gtt; she will need SNF  Status is: Inpatient  Remains inpatient appropriate because:Ongoing diagnostic testing needed not appropriate for outpatient work up, Unsafe d/c plan, IV treatments appropriate due to intensity of illness or inability to take PO and Inpatient level  of care appropriate due to severity of illness   Dispo: The patient is from: Home              Anticipated d/c is to: SNF              Anticipated d/c date is: 2 days              Patient currently is not medically stable to d/c.  Consultants:   Cardiology   Procedures: ECHOCARDIOGRAM IMPRESSIONS    1. Left ventricular ejection fraction, by estimation, is 50 to 55%. The  left ventricle has low normal function. The left ventricle has no regional  wall motion abnormalities. There is mild concentric left ventricular  hypertrophy. Left ventricular  diastolic parameters are indeterminate.  2. Right ventricular systolic function is mildly reduced. The right  ventricular size is normal. There is moderately elevated pulmonary artery  systolic pressure.  3. Left  atrial size was severely dilated.  4. Right atrial size was severely dilated.  5. The mitral valve is normal in structure. Mild mitral valve  regurgitation. No evidence of mitral stenosis.  6. Tricuspid valve regurgitation is moderate to severe.  7. The aortic valve is normal in structure. Aortic valve regurgitation is  not visualized. No aortic stenosis is present.  8. The inferior vena cava is normal in size with greater than 50%  respiratory variability, suggesting right atrial pressure of 3 mmHg.   FINDINGS  Left Ventricle: Left ventricular ejection fraction, by estimation, is 50  to 55%. The left ventricle has low normal function. The left ventricle has  no regional wall motion abnormalities. The left ventricular internal  cavity size was normal in size.  There is mild concentric left ventricular hypertrophy. Left ventricular  diastolic parameters are indeterminate.   Right Ventricle: The right ventricular size is normal. No increase in  right ventricular wall thickness. Right ventricular systolic function is  mildly reduced. There is moderately elevated pulmonary artery systolic  pressure. The tricuspid regurgitant  velocity is 3.30 m/s, and with an assumed right atrial pressure of 3 mmHg,  the estimated right ventricular systolic pressure is 79.0 mmHg.   Left Atrium: Left atrial size was severely dilated.   Right Atrium: Right atrial size was severely dilated.   Pericardium: There is no evidence of pericardial effusion.   Mitral Valve: The mitral valve is normal in structure. Normal mobility of  the mitral valve leaflets. Mild mitral annular calcification. Mild mitral  valve regurgitation. No evidence of mitral valve stenosis.   Tricuspid Valve: The tricuspid valve is normal in structure. Tricuspid  valve regurgitation is moderate to severe. No evidence of tricuspid  stenosis.   Aortic Valve: The aortic valve is normal in structure.. There is moderate  thickening  and moderate calcification of the aortic valve. Aortic valve  regurgitation is not visualized. No aortic stenosis is present. There is  moderate thickening of the aortic  valve. There is moderate calcification of the aortic valve. Aortic valve  mean gradient measures 4.0 mmHg. Aortic valve peak gradient measures 7.2  mmHg. Aortic valve area, by VTI measures 1.20 cm.   Pulmonic Valve: The pulmonic valve was normal in structure. Pulmonic valve  regurgitation is not visualized. No evidence of pulmonic stenosis.   Aorta: The aortic root is normal in size and structure.   Venous: The inferior vena cava is normal in size with greater than 50%  respiratory variability, suggesting right atrial pressure of 3 mmHg.   IAS/Shunts: No  atrial level shunt detected by color flow Doppler.     LEFT VENTRICLE  PLAX 2D  LVIDd:     3.80 cm  LVIDs:     2.80 cm  LV PW:     1.20 cm  LV IVS:    1.37 cm  LVOT diam:   1.80 cm  LV SV:     32  LV SV Index:  22  LVOT Area:   2.54 cm     RIGHT VENTRICLE      IVC  RV Basal diam: 3.60 cm  IVC diam: 1.00 cm  RV Mid diam:  2.90 cm  RV S prime:   5.43 cm/s  TAPSE (M-mode): 0.9 cm   LEFT ATRIUM       Index    RIGHT ATRIUM      Index  LA diam:    3.80 cm 2.60 cm/m RA Area:   24.10 cm  LA Vol (A2C):  62.6 ml 42.84 ml/m RA Volume:  67.90 ml 46.46 ml/m  LA Vol (A4C):  50.0 ml 34.22 ml/m  LA Biplane Vol: 57.6 ml 39.42 ml/m  AORTIC VALVE  AV Area (Vmax):  1.43 cm  AV Area (Vmean):  1.22 cm  AV Area (VTI):   1.20 cm  AV Vmax:      134.00 cm/s  AV Vmean:     97.300 cm/s  AV VTI:      0.269 m  AV Peak Grad:   7.2 mmHg  AV Mean Grad:   4.0 mmHg  LVOT Vmax:     75.10 cm/s  LVOT Vmean:    46.500 cm/s  LVOT VTI:     0.127 m  LVOT/AV VTI ratio: 0.47    AORTA  Ao Root diam: 3.10 cm  Ao Asc diam: 3.00 cm   TRICUSPID VALVE  TR Peak grad:   43.6 mmHg  TR Vmax:    330.00 cm/s    SHUNTS  Systemic VTI: 0.13 m  Systemic Diam: 1.80 cm   Antimicrobials:  Anti-infectives (From admission, onward)   Start     Dose/Rate Route Frequency Ordered Stop   04/15/20 1630  cefTRIAXone (ROCEPHIN) 1 g in sodium chloride 0.9 % 100 mL IVPB        1 g 200 mL/hr over 30 Minutes Intravenous  Once 04/15/20 1620 04/15/20 1920   04/15/20 1630  azithromycin (ZITHROMAX) 500 mg in sodium chloride 0.9 % 250 mL IVPB        500 mg 250 mL/hr over 60 Minutes Intravenous  Once 04/15/20 1620 04/15/20 2046      Subjective: Seen and examined at bedside and she is doing okay and denies any chest pain, lightheadedness or dizziness.  No real shortness of breath.  Daughter states that her appetite has been doing better.  States that she been coughing well eating.  No other concerns or complaints at this time.  Objective: Vitals:   04/19/20 0446 04/19/20 0749 04/19/20 1108 04/19/20 1220  BP: (!) 121/57 113/72 117/75   Pulse: 99 89 80 95  Resp: 17 19 19    Temp: 98.6 F (37 C) 97.8 F (36.6 C) (!) 97.4 F (36.3 C)   TempSrc: Axillary Oral Oral   SpO2: 90% 94% 94%   Weight:      Height:        Intake/Output Summary (Last 24 hours) at 04/19/2020 1441 Last data filed at 04/19/2020 0448 Gross per 24 hour  Intake --  Output 1950 ml  Net -1950 ml  Filed Weights   04/17/20 0118 04/18/20 0413  Weight: 45.4 kg 44.3 kg   Examination: Physical Exam:  Constitutional: WN/WD thin elderly Caucasian female currently in no acute distress appears awake and appears calm Eyes: Lids and conjunctivae normal, sclerae anicteric  ENMT: External Ears, Nose appear normal. Grossly normal hearing.  Neck: Appears normal, supple, no cervical masses, normal ROM, no appreciable thyromegaly; no JVD Respiratory: Diminished to auscultation bilaterally with coarse breath sounds and some slight crackles, no wheezing, rales, rhonchi or crackles. Normal respiratory effort and  patient is not tachypenic. No accessory muscle use. Unlabored breathing but continues over 3 L of supplemental oxygen via nasal cannula Cardiovascular: Irregularly irregular minus tachycardic today, no murmurs / rubs / gallops. S1 and S2 auscultated. S1 plus extremity edema Abdomen: Soft, non-tender, non-distended. Bowel sounds positive.  GU: Deferred. Musculoskeletal: No clubbing / cyanosis of digits/nails. No joint deformity upper and lower extremities.  Skin: No rashes, lesions, ulcers on limited skin evaluation. No induration; Warm and dry.  Neurologic: CN 2-12 grossly intact with no focal deficits. Romberg sign and cerebellar reflexes not assessed.  Psychiatric: Slightly impaired judgment and insight. Alert and oriented x 3. Normal mood and appropriate affect.   Data Reviewed: I have personally reviewed following labs and imaging studies  CBC: Recent Labs  Lab 04/15/20 1541 04/17/20 0846 04/18/20 0319 04/19/20 0658  WBC 9.0 10.0 9.4 17.6*  NEUTROABS 7.8* 8.6* 7.3 15.8*  HGB 10.1* 9.5* 9.5* 9.6*  HCT 32.5* 30.5* 29.6* 29.8*  MCV 84.0 83.3 81.8 81.0  PLT 374 295 283 509   Basic Metabolic Panel: Recent Labs  Lab 04/15/20 1622 04/16/20 0330 04/17/20 0846 04/18/20 0319 04/19/20 0658  NA 139 139 139 137 137  K 4.2 4.0 3.5 3.6 3.2*  CL 106 104 103 98 95*  CO2 22 21* 26 30 31   GLUCOSE 108* 95 91 101* 107*  BUN 15 14 18  25* 24*  CREATININE 1.36* 1.19* 1.43* 1.45* 1.23*  CALCIUM 8.6* 8.2* 8.1* 8.2* 8.7*  MG 2.5*  --  1.8 1.7 1.7  PHOS  --   --  3.5 3.7 3.5   GFR: Estimated Creatinine Clearance: 22.5 mL/min (A) (by C-G formula based on SCr of 1.23 mg/dL (H)). Liver Function Tests: Recent Labs  Lab 04/15/20 1622 04/17/20 0846 04/18/20 0319 04/19/20 0658  AST 22 18 19 24   ALT 18 15 15 16   ALKPHOS 70 57 52 52  BILITOT 0.6 0.5 0.7 0.8  PROT 7.2 6.3* 6.3* 6.3*  ALBUMIN 2.5* 2.3* 2.2* 2.3*   No results for input(s): LIPASE, AMYLASE in the last 168 hours. No results  for input(s): AMMONIA in the last 168 hours. Coagulation Profile: Recent Labs  Lab 04/15/20 1629  INR 1.5*   Cardiac Enzymes: No results for input(s): CKTOTAL, CKMB, CKMBINDEX, TROPONINI in the last 168 hours. BNP (last 3 results) No results for input(s): PROBNP in the last 8760 hours. HbA1C: No results for input(s): HGBA1C in the last 72 hours. CBG: No results for input(s): GLUCAP in the last 168 hours. Lipid Profile: No results for input(s): CHOL, HDL, LDLCALC, TRIG, CHOLHDL, LDLDIRECT in the last 72 hours. Thyroid Function Tests: No results for input(s): TSH, T4TOTAL, FREET4, T3FREE, THYROIDAB in the last 72 hours. Anemia Panel: Recent Labs    04/17/20 0846  VITAMINB12 470  FOLATE 26.5  FERRITIN 62  TIBC 259  IRON 20*  RETICCTPCT 1.9   Sepsis Labs: Recent Labs  Lab 04/15/20 2044 04/15/20 2055 04/16/20 0330 04/17/20 0846  PROCALCITON 1.23  --  1.06 0.51  LATICACIDVEN 1.2 0.7  --   --     Recent Results (from the past 240 hour(s))  Blood culture (routine x 2)     Status: None (Preliminary result)   Collection Time: 04/15/20  4:25 PM   Specimen: BLOOD RIGHT HAND  Result Value Ref Range Status   Specimen Description BLOOD RIGHT HAND  Final   Special Requests   Final    BOTTLES DRAWN AEROBIC AND ANAEROBIC Blood Culture results may not be optimal due to an inadequate volume of blood received in culture bottles   Culture   Final    NO GROWTH 4 DAYS Performed at [facility], [place] [address], [place], [place] [address]    Report Status PENDING  Incomplete  Blood culture (routine x 2)     Status: None (Preliminary result)   Collection Time: 04/15/20  4:40 PM   Specimen: BLOOD RIGHT ARM  Result Value Ref Range Status   Specimen Description BLOOD RIGHT ARM  Final   Special Requests   Final    BOTTLES DRAWN AEROBIC AND ANAEROBIC Blood Culture results may not be optimal due to an excessive volume of blood received in culture bottles   Culture   Final    NO  GROWTH 4 DAYS Performed at [facility], [place] [address], [place], [place] [address]    Report Status PENDING  Incomplete  SARS Coronavirus 2 by RT PCR (hospital order, performed in [facility] hospital lab) Nasopharyngeal Nasopharyngeal Swab     Status: None   Collection Time: 04/15/20  9:52 PM   Specimen: Nasopharyngeal Swab  Result Value Ref Range Status   SARS Coronavirus 2 NEGATIVE NEGATIVE Final    Comment: (NOTE) SARS-CoV-2 target nucleic acids are NOT DETECTED.  The SARS-CoV-2 RNA is generally detectable in upper and lower respiratory specimens during the acute phase of infection. The lowest concentration of SARS-CoV-2 viral copies this assay can detect is 250 copies / mL. A negative result does not preclude SARS-CoV-2 infection and should not be used as the sole basis for treatment or other patient management decisions.  A negative result may occur with improper specimen collection / handling, submission of specimen other than nasopharyngeal swab, presence of viral mutation(s) within the areas targeted by this assay, and inadequate number of viral copies (<250 copies / mL). A negative result must be combined with clinical observations, patient history, and epidemiological information.  Fact Sheet for Patients:   StrictlyIdeas.no  Fact Sheet for Healthcare Providers: BankingDealers.co.za  This test is not yet approved or  cleared by the [place] FDA and has been authorized for detection and/or diagnosis of SARS-CoV-2 by FDA under an Emergency Use Authorization (EUA).  This EUA will remain in effect (meaning this test can be used) for the duration of the COVID-19 declaration under Section 564(b)(1) of the Act, 21 U.S.C. section 360bbb-3(b)(1), unless the authorization is terminated or revoked sooner.  Performed at [facility], [place] [address], [place], [place] [address]      RN Pressure Injury  Documentation: Pressure Injury 04/17/20 Buttocks Left Stage 2 -  Partial thickness loss of dermis presenting as a shallow open injury with a red, pink wound bed without slough. (Active)  04/17/20 0132  Location: Buttocks  Location Orientation: Left  Staging: Stage 2 -  Partial thickness loss of dermis presenting as a shallow open injury with a red, pink wound bed without slough.  Wound  Description (Comments):   Present on Admission:      Estimated body mass index is 19.07 kg/m as calculated from the following:   Height as of this encounter: 5' (1.524 m).   Weight as of this encounter: 44.3 kg.  Malnutrition Type:  Nutrition Problem: Severe Malnutrition Etiology: chronic illness (CHF)   Malnutrition Characteristics:  Signs/Symptoms: severe muscle depletion, severe fat depletion   Nutrition Interventions:  Interventions: Ensure Enlive (each supplement provides 350kcal and 20 grams of protein), MVI, Magic cup Radiology Studies: DG CHEST PORT 1 VIEW  Result Date: 04/19/2020 CLINICAL DATA:  Cardiomegaly. EXAM: PORTABLE CHEST 1 VIEW COMPARISON:  April 17, 2020. FINDINGS: Stable cardiomegaly. No pneumothorax is noted. Stable interstitial densities are noted throughout both lungs which may represent edema or possibly atypical inflammation. Small left pleural effusion is noted. Bony thorax is unremarkable. IMPRESSION: Stable cardiomegaly. Stable interstitial densities are noted throughout both lungs which may represent edema or possibly atypical inflammation. Small left pleural effusion is noted. Aortic Atherosclerosis (ICD10-I70.0). Electronically Signed   By: Marijo Conception M.D.   On: 04/19/2020 12:42   Scheduled Meds: . apixaban  2.5 mg Oral BID  . atorvastatin  20 mg Oral Daily  . brimonidine  1 drop Right Eye BID  . feeding supplement (ENSURE ENLIVE)  237 mL Oral BID BM  . furosemide  40 mg Intravenous Q12H  . latanoprost  1 drop Both Eyes Q1200  . mouth rinse  15 mL Mouth Rinse  BID  . [START ON 04/20/2020] metoprolol succinate  75 mg Oral Daily  . multivitamin with minerals  1 tablet Oral Daily  . Netarsudil Dimesylate  1 drop Right Eye Daily  . potassium chloride  40 mEq Oral BID  . sodium chloride flush  3 mL Intravenous Q12H   Continuous Infusions: . sodium chloride 20 mL (04/19/20 1122)  . diltiazem (CARDIZEM) infusion 5 mg/hr (04/19/20 1217)    LOS: 4 days   Kerney Elbe, DO [facility] PAGER is on [place]  If 7PM-7AM, please contact night-coverage [email or web address]

## 2020-04-19 NOTE — TOC Progression Note (Signed)
Transition of Care [facility] - Progression Note    Patient Details  Name: Kelli Peterson MRN: [id number] Date of Birth: 30-Jul-1930  Transition of [facility]) CM/SW [place], LCSW Phone Number: [phone] 04/19/2020, 9:01 AM  Clinical Narrative:     CSW checked the portal for patient's authorization and it has been approved.  Auth dates: 7/16-7/20.  TOC team will continue to assist with discharge planning needs.   Expected Discharge Plan: [place] Barriers to Discharge: Continued Medical Work up  Expected Discharge Plan and Services Expected Discharge Plan: [place] arrangements for the past 2 months: Single Family Home                                       Social Determinants of Health (SDOH) Interventions    Readmission Risk Interventions No flowsheet data found.

## 2020-04-19 NOTE — Progress Notes (Signed)
Progress Note  Patient Name: Kelli Peterson Date of Encounter: 04/19/2020  Primary Cardiologist: No primary care provider on file.   Subjective   No chest pain or SOB.  HR improved in the 90-100's today.   Inpatient Medications    Scheduled Meds: . apixaban  2.5 mg Oral BID  . atorvastatin  20 mg Oral Daily  . brimonidine  1 drop Right Eye BID  . feeding supplement (ENSURE ENLIVE)  237 mL Oral BID BM  . furosemide  40 mg Intravenous Q12H  . latanoprost  1 drop Both Eyes Q1200  . mouth rinse  15 mL Mouth Rinse BID  . metoprolol succinate  50 mg Oral Daily  . multivitamin with minerals  1 tablet Oral Daily  . Netarsudil Dimesylate  1 drop Right Eye Daily  . potassium chloride  40 mEq Oral BID  . sodium chloride flush  3 mL Intravenous Q12H   Continuous Infusions: . sodium chloride    . diltiazem (CARDIZEM) infusion Stopped (04/19/20 0911)  . magnesium sulfate bolus IVPB     PRN Meds: sodium chloride, acetaminophen, ipratropium-albuterol, ondansetron (ZOFRAN) IV, Resource ThickenUp Clear, sodium chloride flush   Vital Signs    Vitals:   04/19/20 0019 04/19/20 0446 04/19/20 0749 04/19/20 1108  BP: 135/75 (!) 121/57 113/72 117/75  Pulse: 95 99 89 80  Resp: (!) 22 17 19    Temp: 97.6 F (36.4 C) 98.6 F (37 C) 97.8 F (36.6 C)   TempSrc: Axillary Axillary Oral   SpO2: 95% 90% 94%   Weight:      Height:        Intake/Output Summary (Last 24 hours) at 04/19/2020 1111 Last data filed at 04/19/2020 0448 Gross per 24 hour  Intake --  Output 1950 ml  Net -1950 ml   Filed Weights   04/17/20 0118 04/18/20 0413  Weight: 45.4 kg 44.3 kg    Telemetry    Atrial fibrillation with mild RVR - Personally Reviewed  ECG    No new EKG to review - Personally Reviewed  Physical Exam   GEN: Well nourished, well developed in no acute distress HEENT: Normal NECK: No JVD; No carotid bruits LYMPHATICS: No lymphadenopathy CARDIAC:irregularly irregular, no murmurs, rubs,  gallops RESPIRATORY:  Clear to auscultation without rales, wheezing or rhonchi  ABDOMEN: Soft, non-tender, non-distended MUSCULOSKELETAL:  No edema; No deformity  SKIN: Warm and dry NEUROLOGIC:  Alert and oriented x 3 PSYCHIATRIC:  Normal affect  PSYCHIATRIC:  Normal affect    Labs    Chemistry Recent Labs  Lab 04/17/20 0846 04/18/20 0319 04/19/20 0658  NA 139 137 137  K 3.5 3.6 3.2*  CL 103 98 95*  CO2 26 30 31   GLUCOSE 91 101* 107*  BUN 18 25* 24*  CREATININE 1.43* 1.45* 1.23*  CALCIUM 8.1* 8.2* 8.7*  PROT 6.3* 6.3* 6.3*  ALBUMIN 2.3* 2.2* 2.3*  AST 18 19 24   ALT 15 15 16   ALKPHOS 57 52 52  BILITOT 0.5 0.7 0.8  GFRNONAA 33* 32* 39*  GFRAA 38* 37* 46*  ANIONGAP 10 9 11      Hematology Recent Labs  Lab 04/17/20 0846 04/18/20 0319 04/19/20 0658  WBC 10.0 9.4 17.6*  RBC 3.66*  3.65* 3.62* 3.68*  HGB 9.5* 9.5* 9.6*  HCT 30.5* 29.6* 29.8*  MCV 83.3 81.8 81.0  MCH 26.0 26.2 26.1  MCHC 31.1 32.1 32.2  RDW 17.9* 17.8* 18.1*  PLT 295 283 269    Cardiac EnzymesNo results for  input(s): TROPONINI in the last 168 hours. No results for input(s): TROPIPOC in the last 168 hours.   BNP Recent Labs  Lab 04/15/20 1541  BNP 528.5*     DDimer No results for input(s): DDIMER in the last 168 hours.   Radiology    No results found.  Cardiac Studies   2D echo  04/16/2020 IMPRESSIONS    1. Left ventricular ejection fraction, by estimation, is 50 to 55%. The  left ventricle has low normal function. The left ventricle has no regional  wall motion abnormalities. There is mild concentric left ventricular  hypertrophy. Left ventricular  diastolic parameters are indeterminate.  2. Right ventricular systolic function is mildly reduced. The right  ventricular size is normal. There is moderately elevated pulmonary artery  systolic pressure.  3. Left atrial size was severely dilated.  4. Right atrial size was severely dilated.  5. The mitral valve is normal in  structure. Mild mitral valve  regurgitation. No evidence of mitral stenosis.  6. Tricuspid valve regurgitation is moderate to severe.  7. The aortic valve is normal in structure. Aortic valve regurgitation is  not visualized. No aortic stenosis is present.  8. The inferior vena cava is normal in size with greater than 50%  respiratory variability, suggesting right atrial pressure of 3 mmHg.   Patient Profile     86 y.o. female with a hx of  AF on Eliquis, HTN, HLD, chronic diastolic CHF who is being seen today for the evaluation of atrial fibrillation and CHF exacerbation at the request of Dr. Doristine Bosworth.  Assessment & Plan    1.  Chronic atrial fibrillation now with RVR -she has had chronic afib managed with CCB and BB as well as Eliquis by PCP -TSH normal -Her HR remains elevated at times today in the 120's despite IV Cardizem gtt. Titrate for HR control -Stopped digoxin due to advanced age.   -Stopped amlodipine since she is already on CCB  -increase Toprol XL to 75mg  daily -Continue apixaban 2.5mg  BID (dosed for age>80, weight < 60kg).  -she has severe BAE on echo would likely not convert to NSR and maintain rhythm so will continue with rate control  2.  Acute on chronic diastolic CHF -2D echo this admit with EF 50-55% with mild LVH -She has some mild volume overload with crackles on lung exam and mild LE edema -she is currently on IV Lasix and put out 1.9L yesterday and is net net 4.3L -Creatinine improved with diuresis from 1.36>1.19>1.43>1.45>1.23).  -continue Lasix 40mg  IV BID and follow strict I&O's, daily weights and renal function -likely exacerbated by elevated HR and needs good HR control and strict low Na diet  3.  Moderate pulmonary HTN -likely Group 2 related to pulmonary venous HTN from CHF -TR moderate to severe and RVF mildly reduced -continue IV diuretics  4.  HTN -BP stable at 114/6mmHg -continue Cardizem gtt and Toprol  -stopped amlodipine as she is  already on a CCB -holding Hydralazine for now as we need more BP to allow for uptitration of BB and CCB for HR control  5.  AKI on CKD stage 2 -Creatinine fairly stable with diuresis  (1.36>1.19>1.43>1.45>1.23) with diuresis -follow daily BMET  6.  Hypokalemia -K+ 3.2 today -will replete -repeat BMET in am    For questions or updates, please contact [place] Please consult [email or web address] for contact info under Cardiology/STEMI.      Signed, Fransico Him, MD  04/19/2020, 11:11 AM

## 2020-04-19 NOTE — Evaluation (Signed)
Clinical/Bedside Swallow Evaluation Patient Details  Name: Kelli Peterson MRN: [id number] Date of Birth: 04/28/1930  Today's Date: 04/19/2020 Time: SLP Start Time (ACUTE ONLY): 59 SLP Stop Time (ACUTE ONLY): 1035 SLP Time Calculation (min) (ACUTE ONLY): 29 min  Past Medical History:  Past Medical History:  Diagnosis Date  . Adenomatous colon polyp 2007  . Atrial fibrillation ([place])   . Barrett's esophagus 2007  . Cerebellar hemorrhage ([place])   . Cervical cancer ([place])   . CHF (congestive heart failure) ([place])   . Closed pelvic fracture ([place])   . CVD (cardiovascular disease)   . DVT (deep venous thrombosis) ([place])   . Fibrocystic breast disease   . Fracture of pubic ramus ([place])   . Hyperlipidemia   . Hypertension   . Iron deficiency anemia    Past Surgical History:  Past Surgical History:  Procedure Laterality Date  . CATARACT EXTRACTION Right 02/15/2010   [place]  . CATARACT EXTRACTION Left 01/30/2010   [place]  . CATARACT EXTRACTION, BILATERAL  2010  . TOTAL ABDOMINAL HYSTERECTOMY  1964   HPI:  Pt is an 86 y/o female admitted secondary to palpitations and SOB x3 weeks. Pt found to have acute respiratory failure with hypoxemia and a-fib with RVR. Pt was noted to cough with thin liquids and had an increase in WBC, prompting swallow evaluation. PMH including but not limited to Barrett's esophagus, cerebellar hemorrhage, HTN, CHF, a-fib and cervical cancer.   Assessment / Plan / Recommendation Clinical Impression  Pt and her daughter describe a h/o dysphagia c/b coughing with PO intake, for which she had previously been told at SNF to tuck her chin. Per their description, this may have reduced some of the coughing but has not eliminated it, although pt has never had PNA before. She does have coughing with thin liquids and when trying to use a liquid wash to clear dry solids from her oral cavity. Orally she seems to have some lingual and labial weakness, with anterior loss and at  times excessive secretions noted. Pt manages this here and PTA by wiping her mouth as opposed to swallowing this build up of saliva. Her dentures also fit quite loosely, which could be contributing to more prolonged bolus preparation. SLP intervention for softening solids and thickening liquids eliminated any further coughing and seemed to provide swifter oral preparation with less anterior loss. Recommend Dys 2 diet and nectar thick liquids. SLP will f/u for tolerance and potential to advance as she overcomes this acute hospitalization vs need for MBS.  SLP Visit Diagnosis: Dysphagia, unspecified (R13.10)    Aspiration Risk  Moderate aspiration risk    Diet Recommendation Dysphagia 2 (Fine chop);Nectar-thick liquid   Liquid Administration via: Cup;Straw Medication Administration: Whole meds with puree Supervision: Staff to assist with self feeding;Full supervision/cueing for compensatory strategies Compensations: Minimize environmental distractions;Slow rate;Small sips/bites;Lingual sweep for clearance of pocketing;Monitor for anterior loss Postural Changes: Seated upright at 90 degrees    Other  Recommendations Oral Care Recommendations: Oral care BID Other Recommendations: Order thickener from pharmacy;Prohibited food (jello, ice cream, thin soups);Remove water pitcher;Have oral suction available   Follow up Recommendations Skilled Nursing facility      Frequency and Duration min 2x/week  2 weeks       Prognosis Prognosis for Safe Diet Advancement: Fair Barriers to Reach Goals: Time post onset      Swallow Study   General HPI: Pt is an 86 y/o female admitted secondary to palpitations and SOB x3 weeks. Pt found  to have acute respiratory failure with hypoxemia and a-fib with RVR. Pt was noted to cough with thin liquids and had an increase in WBC, prompting swallow evaluation. PMH including but not limited to Barrett's esophagus, cerebellar hemorrhage, HTN, CHF, a-fib and cervical  cancer. Type of Study: Bedside Swallow Evaluation Previous Swallow Assessment: none in chart - see note Diet Prior to this Study: Regular;Thin liquids Temperature Spikes Noted: No Respiratory Status: Nasal cannula History of Recent Intubation: No Behavior/Cognition: Alert;Cooperative Oral Cavity Assessment: Dry Oral Care Completed by SLP: No Oral Cavity - Dentition: Dentures, top;Dentures, bottom (loosely fitting) Vision: Functional for self-feeding Self-Feeding Abilities: Able to feed self;Needs assist Patient Positioning: Upright in bed Baseline Vocal Quality: Normal Volitional Cough: Strong    Oral/Motor/Sensory Function Overall Oral Motor/Sensory Function: Generalized oral weakness (although did not perform all tasks)   Ice Chips Ice chips: Not tested   Thin Liquid Thin Liquid: Impaired Presentation: Cup;Self Fed;Straw Oral Phase Impairments: Reduced lingual movement/coordination;Reduced labial seal Oral Phase Functional Implications: Other (comment) (anterior spillage) Pharyngeal  Phase Impairments: Cough - Immediate;Cough - Delayed    Nectar Thick Nectar Thick Liquid: Impaired Presentation: Self Fed;Straw Oral Phase Impairments: Reduced labial seal;Reduced lingual movement/coordination   Honey Thick Honey Thick Liquid: Not tested   Puree Puree: Impaired Presentation: Spoon Oral Phase Impairments: Reduced lingual movement/coordination;Reduced labial seal   Solid     Solid: Impaired Presentation: Self Fed Oral Phase Impairments: Impaired mastication;Reduced lingual movement/coordination;Reduced labial seal Oral Phase Functional Implications: Oral residue Pharyngeal Phase Impairments: Cough - Immediate      Osie Bond., M.A. [facility] Pager [phone] Office [phone]  04/19/2020,10:59 AM

## 2020-04-19 NOTE — Progress Notes (Addendum)
Pharmacy Antibiotic Note  Kelli Peterson is a 86 y.o. female with concern of aspiration PNA.  Pharmacy has been consulted for Unasyn dosing. She is noted with AKI on CKD. -WBC= 17.6, afeb -SCr= 1.23 (0.89 in 08/2019), CrCl ~ 25 (wt= 44.3kg) -blood cultures pending  Plan: -Unasyn 3gm IV q12h -Will follow renal function, cultures and clinical progress   Height: 5' (152.4 cm) Weight: 44.3 kg (97 lb 10.6 oz) IBW/kg (Calculated) : 45.5  Temp (24hrs), Avg:97.9 F (36.6 C), Min:97.4 F (36.3 C), Max:98.6 F (37 C)  Recent Labs  Lab 04/15/20 1541 04/15/20 1622 04/15/20 2044 04/15/20 2055 04/16/20 0330 04/17/20 0846 04/18/20 0319 04/19/20 0658  WBC 9.0  --   --   --   --  10.0 9.4 17.6*  CREATININE  --  1.36*  --   --  1.19* 1.43* 1.45* 1.23*  LATICACIDVEN  --   --  1.2 0.7  --   --   --   --     Estimated Creatinine Clearance: 22.5 mL/min (A) (by C-G formula based on SCr of 1.23 mg/dL (H)).    Allergies  Allergen Reactions  . Penicillins Swelling and Other (See Comments)    Ankles and feet swell  . Zocor [Simvastatin] Other (See Comments)    Weak and dizzy  . Nsaids Other (See Comments)    Tylenol is all that is permitted    Antimicrobials this admission: 7/17 unasyn>>  Dose adjustments this admission:   Microbiology results: 7/17 blood 7/13 blood - ngtd  Thank you for allowing pharmacy to be a part of this patient's care.  Hildred Laser, PharmD Clinical Pharmacist **Pharmacist phone directory can now be found on [email or web address] ([email or web address]).  Listed under [place].

## 2020-04-20 ENCOUNTER — Inpatient Hospital Stay (HOSPITAL_COMMUNITY): Payer: Medicare Other

## 2020-04-20 DIAGNOSIS — I5033 Acute on chronic diastolic (congestive) heart failure: Secondary | ICD-10-CM | POA: Diagnosis not present

## 2020-04-20 DIAGNOSIS — I10 Essential (primary) hypertension: Secondary | ICD-10-CM | POA: Diagnosis not present

## 2020-04-20 DIAGNOSIS — N1831 Chronic kidney disease, stage 3a: Secondary | ICD-10-CM | POA: Diagnosis not present

## 2020-04-20 DIAGNOSIS — I4891 Unspecified atrial fibrillation: Secondary | ICD-10-CM | POA: Diagnosis not present

## 2020-04-20 LAB — CBC WITH DIFFERENTIAL/PLATELET
Abs Immature Granulocytes: 0.04 10*3/uL (ref 0.00–0.07)
Basophils Absolute: 0 10*3/uL (ref 0.0–0.1)
Basophils Relative: 0 %
Eosinophils Absolute: 0.4 10*3/uL (ref 0.0–0.5)
Eosinophils Relative: 4 %
HCT: 28.9 % — ABNORMAL LOW (ref 36.0–46.0)
Hemoglobin: 8.9 g/dL — ABNORMAL LOW (ref 12.0–15.0)
Immature Granulocytes: 0 %
Lymphocytes Relative: 7 %
Lymphs Abs: 0.6 10*3/uL — ABNORMAL LOW (ref 0.7–4.0)
MCH: 25.4 pg — ABNORMAL LOW (ref 26.0–34.0)
MCHC: 30.8 g/dL (ref 30.0–36.0)
MCV: 82.3 fL (ref 80.0–100.0)
Monocytes Absolute: 1 10*3/uL (ref 0.1–1.0)
Monocytes Relative: 11 %
Neutro Abs: 6.9 10*3/uL (ref 1.7–7.7)
Neutrophils Relative %: 78 %
Platelets: 225 10*3/uL (ref 150–400)
RBC: 3.51 MIL/uL — ABNORMAL LOW (ref 3.87–5.11)
RDW: 18.1 % — ABNORMAL HIGH (ref 11.5–15.5)
WBC: 8.9 10*3/uL (ref 4.0–10.5)
nRBC: 0 % (ref 0.0–0.2)

## 2020-04-20 LAB — URINE CULTURE: Culture: NO GROWTH

## 2020-04-20 LAB — COMPREHENSIVE METABOLIC PANEL
ALT: 17 U/L (ref 0–44)
AST: 26 U/L (ref 15–41)
Albumin: 2.1 g/dL — ABNORMAL LOW (ref 3.5–5.0)
Alkaline Phosphatase: 52 U/L (ref 38–126)
Anion gap: 9 (ref 5–15)
BUN: 22 mg/dL (ref 8–23)
CO2: 33 mmol/L — ABNORMAL HIGH (ref 22–32)
Calcium: 8.5 mg/dL — ABNORMAL LOW (ref 8.9–10.3)
Chloride: 96 mmol/L — ABNORMAL LOW (ref 98–111)
Creatinine, Ser: 1.21 mg/dL — ABNORMAL HIGH (ref 0.44–1.00)
GFR calc Af Amer: 47 mL/min — ABNORMAL LOW (ref >60)
GFR calc non Af Amer: 40 mL/min — ABNORMAL LOW (ref >60)
Glucose, Bld: 94 mg/dL (ref 70–99)
Potassium: 3.9 mmol/L (ref 3.5–5.1)
Sodium: 138 mmol/L (ref 135–145)
Total Bilirubin: 0.7 mg/dL (ref 0.3–1.2)
Total Protein: 6.2 g/dL — ABNORMAL LOW (ref 6.5–8.1)

## 2020-04-20 LAB — CULTURE, BLOOD (ROUTINE X 2)
Culture: NO GROWTH
Culture: NO GROWTH

## 2020-04-20 LAB — MAGNESIUM: Magnesium: 2.2 mg/dL (ref 1.7–2.4)

## 2020-04-20 LAB — PHOSPHORUS: Phosphorus: 3.9 mg/dL (ref 2.5–4.6)

## 2020-04-20 MED ORDER — DILTIAZEM HCL 60 MG PO TABS
30.0000 mg | ORAL_TABLET | Freq: Four times a day (QID) | ORAL | Status: DC
  Administered 2020-04-20 – 2020-04-22 (×9): 30 mg via ORAL
  Filled 2020-04-20 (×9): qty 1

## 2020-04-20 NOTE — Progress Notes (Signed)
Progress Note  Patient Name: Kelli Peterson Date of Encounter: 04/20/2020  Primary Cardiologist: No primary care provider on file.   Subjective   Denies any chest pain or SOB.  Remains in atrial fibrillation with HR in the 80's.  Inpatient Medications    Scheduled Meds: . apixaban  2.5 mg Oral BID  . atorvastatin  20 mg Oral Daily  . brimonidine  1 drop Right Eye BID  . feeding supplement (ENSURE ENLIVE)  237 mL Oral BID BM  . furosemide  40 mg Intravenous Q12H  . latanoprost  1 drop Both Eyes Q1200  . mouth rinse  15 mL Mouth Rinse BID  . metoprolol succinate  75 mg Oral Daily  . multivitamin with minerals  1 tablet Oral Daily  . Netarsudil Dimesylate  1 drop Right Eye Daily  . sodium chloride flush  3 mL Intravenous Q12H   Continuous Infusions: . sodium chloride Stopped (04/19/20 1631)  . ampicillin-sulbactam (UNASYN) IV 3 g (04/20/20 0430)  . diltiazem (CARDIZEM) infusion Stopped (04/19/20 1630)   PRN Meds: sodium chloride, acetaminophen, ipratropium-albuterol, ondansetron (ZOFRAN) IV, Resource ThickenUp Clear, sodium chloride flush   Vital Signs    Vitals:   04/19/20 1644 04/19/20 1943 04/19/20 2355 04/20/20 0354  BP: 116/75 115/83 106/78 125/69  Pulse: 78 91 72 83  Resp: 19 16 18 12   Temp: (!) 97.3 F (36.3 C) 99 F (37.2 C) 99.1 F (37.3 C) 98.9 F (37.2 C)  TempSrc: Oral Axillary Axillary Axillary  SpO2: 95% 98% 94% 97%  Weight:    49.9 kg  Height:        Intake/Output Summary (Last 24 hours) at 04/20/2020 8676 Last data filed at 04/19/2020 2359 Gross per 24 hour  Intake 813.37 ml  Output --  Net 813.37 ml   Filed Weights   04/17/20 0118 04/18/20 0413 04/20/20 0354  Weight: 45.4 kg 44.3 kg 49.9 kg    Telemetry    Atrial fibrillation with CVR- Personally Reviewed  ECG  No new EKG to review - Personally Reviewed  Physical Exam   GEN: Well nourished, well developed in no acute distress HEENT: Normal NECK: No JVD; No carotid  bruits LYMPHATICS: No lymphadenopathy CARDIAC:irregularly irregular, no murmurs, rubs, gallops RESPIRATORY:  Crackles at bases ABDOMEN: Soft, non-tender, non-distended MUSCULOSKELETAL:  No edema; No deformity  SKIN: Warm and dry NEUROLOGIC:  Alert and oriented x 3 PSYCHIATRIC:  Normal affect    Labs    Chemistry Recent Labs  Lab 04/18/20 0319 04/19/20 0658 04/20/20 0548  NA 137 137 138  K 3.6 3.2* 3.9  CL 98 95* 96*  CO2 30 31 33*  GLUCOSE 101* 107* 94  BUN 25* 24* 22  CREATININE 1.45* 1.23* 1.21*  CALCIUM 8.2* 8.7* 8.5*  PROT 6.3* 6.3* 6.2*  ALBUMIN 2.2* 2.3* 2.1*  AST 19 24 26   ALT 15 16 17   ALKPHOS 52 52 52  BILITOT 0.7 0.8 0.7  GFRNONAA 32* 39* 40*  GFRAA 37* 46* 47*  ANIONGAP 9 11 9      Hematology Recent Labs  Lab 04/18/20 0319 04/19/20 0658 04/20/20 0548  WBC 9.4 17.6* 8.9  RBC 3.62* 3.68* 3.51*  HGB 9.5* 9.6* 8.9*  HCT 29.6* 29.8* 28.9*  MCV 81.8 81.0 82.3  MCH 26.2 26.1 25.4*  MCHC 32.1 32.2 30.8  RDW 17.8* 18.1* 18.1*  PLT 283 269 225    Cardiac EnzymesNo results for input(s): TROPONINI in the last 168 hours. No results for input(s): TROPIPOC in the  last 168 hours.   BNP Recent Labs  Lab 04/15/20 1541  BNP 528.5*     DDimer No results for input(s): DDIMER in the last 168 hours.   Radiology    DG CHEST PORT 1 VIEW  Result Date: 04/19/2020 CLINICAL DATA:  Cardiomegaly. EXAM: PORTABLE CHEST 1 VIEW COMPARISON:  April 17, 2020. FINDINGS: Stable cardiomegaly. No pneumothorax is noted. Stable interstitial densities are noted throughout both lungs which may represent edema or possibly atypical inflammation. Small left pleural effusion is noted. Bony thorax is unremarkable. IMPRESSION: Stable cardiomegaly. Stable interstitial densities are noted throughout both lungs which may represent edema or possibly atypical inflammation. Small left pleural effusion is noted. Aortic Atherosclerosis (ICD10-I70.0). Electronically Signed   By: Marijo Conception  M.D.   On: 04/19/2020 12:42    Cardiac Studies   2D echo  04/16/2020 IMPRESSIONS    1. Left ventricular ejection fraction, by estimation, is 50 to 55%. The  left ventricle has low normal function. The left ventricle has no regional  wall motion abnormalities. There is mild concentric left ventricular  hypertrophy. Left ventricular  diastolic parameters are indeterminate.  2. Right ventricular systolic function is mildly reduced. The right  ventricular size is normal. There is moderately elevated pulmonary artery  systolic pressure.  3. Left atrial size was severely dilated.  4. Right atrial size was severely dilated.  5. The mitral valve is normal in structure. Mild mitral valve  regurgitation. No evidence of mitral stenosis.  6. Tricuspid valve regurgitation is moderate to severe.  7. The aortic valve is normal in structure. Aortic valve regurgitation is  not visualized. No aortic stenosis is present.  8. The inferior vena cava is normal in size with greater than 50%  respiratory variability, suggesting right atrial pressure of 3 mmHg.   Patient Profile     86 y.o. female with a hx of  AF on Eliquis, HTN, HLD, chronic diastolic CHF who is being seen today for the evaluation of atrial fibrillation and CHF exacerbation at the request of Dr. Doristine Bosworth.  Assessment & Plan    1.  Chronic atrial fibrillation now with RVR -she has had chronic afib managed with CCB and BB as well as Eliquis by PCP -TSH normal -Stopped digoxin due to advanced age.   -Stopped amlodipine since she is already on CCB  -HR much improved after increasing BB dose further and in the 80-90's but still at times goes up to 120's transiently -continue Toprol XL to 75mg  daily -change IV Cardizem gtt to PO Cardizem 30mg  q6 hours and titrate as needed for HR control -Continue apixaban 2.5mg  BID (dosed for age>80, weight < 60kg).  -she has severe BAE on echo would likely not convert to NSR and maintain rhythm  so will continue with rate control  2.  Acute on chronic diastolic CHF -2D echo this admit with EF 50-55% with mild LVH -She has some mild volume overload with crackles on lung exam but LE edema has resolved -she is currently on IV Lasix but I&O's incomplete over last 24 hr.  She is at least net neg 3.5L since admit -Creatinine improved with diuresis from 1.36>1.19>1.43>1.45>1.23>1.21) -continue Lasix 40mg  IV BID and follow strict I&O's, daily weights and renal function -likely exacerbated by elevated HR and needs good HR control and strict low Na diet  3.  Moderate pulmonary HTN -likely Group 2 related to pulmonary venous HTN from CHF -TR moderate to severe and RVF mildly reduced -continue IV diuretics  4.  HTN -BP stable at 125/96mmHg -continue Toprol and change IV Cardizem to PO 30mg  q6 hours -stopped amlodipine as she is already on a CCB -stopped Hydralazine to allow more BP to allow for uptitration of BB and CCB for HR control  5.  AKI on CKD stage 2 -Creatinine fairly stable with diuresis  (1.36>1.19>1.43>1.45>1.23>1.21) with diuresis -follow daily BMET  6.  Hypokalemia -K+ 3.9 after repleting   For questions or updates, please contact [place] Please consult [email or web address] for contact info under Cardiology/STEMI.      Signed, Fransico Him, MD  04/20/2020, 9:23 AM

## 2020-04-20 NOTE — Progress Notes (Signed)
PROGRESS NOTE    Kelli Peterson  SWN:[id number] DOB: November 19, 1929 DOA: 04/15/2020 PCP: Jani Gravel, MD  Brief Narrative:  HPI per Dr. Early Osmond on 04/15/20 Kelli Peterson is a 86 y.o. female with medical history significant of A. fib-on Eliquis, hypertension, hyperlipidemia, chronic diastolic CHF presents to emergency department with palpitation and shortness of breath since 3 weeks.  Patient's daughter at the bedside is the story and.  She tells me that patient has palpitation and shortness of breath since 3 weeks.  She was seen by her PCP and diltiazem dose was increased and and on follow-up appointment digoxin was added yesterday however due to persistent symptoms patient came to ER for further evaluation and management.  Patient's daughter reports leg swelling, weight gain, shortness of breath and decreased oxygen saturation in 70s and 80s.  Patient is not on home oxygen.  Patient cannot lie flat due to dizziness, she sleeps on a recliner.  She has chronic cough however denies wheezing, change in sputum color, fever, chills, nausea, vomiting, diarrhea, headache, blurry vision, chest pain, urinary or bowel changes.  She lives with her daughter at home.  Uses walker for ambulation.  No history of smoking, alcohol, street drug use.    ED Course: Upon arrival to ED: Patient's heart rate noted to be in the 130s, tachypneic, requiring 3 L of oxygen via nasal cannula.,  Afebrile with no leukocytosis, BNP: 528, digoxin level: 0.7, lactic acid, COVID-19, blood culture: Pending.  Chest x-ray shows interstitial prominence throughout both lungs, increased as compared to the prior exam.  Findings are nonspecific but may reflect interstitial edema or atypical/viral pneumonia.  Unchanged cardiomegaly.  Patient received metoprolol 5 mg, Lasix 20 mg and Rocephin and azithromycin in ED.  EDP consulted cardiology.  [facility] hospitalist consulted for admission for A. fib with RVR and new onset acute hypoxemic  respiratory failure.  **Interim History  She continues to remain slightly dyspneic and has been wearing oxygen.  Family states that they have noticed worsening swelling.  Has never seen a cardiologist but has had PCP try and manage.  Echocardiogram has been done and shows a left ventricular ejection fraction of 50 to 55% with the left ventricle having a low normal function there is mild concentric left ventricular hypertrophy.  Left ventricular diastolic parameters were indeterminate and she has a right ventricular size function that is mildly reduced with severely dilated left atrial size as well as right atrial size and tricuspid regurgitation is moderate to severe.  Cardiology evaluated and feels that they will not be able to convert her to normal sinus rhythm and maintain the rhythm so they can recommend rate control given her severe bilateral atrial enlargement.  Cardiology has adjusted her medications and they recommend continuing the Cardizem drip and restarting Toprol-XL 25 mg p.o. daily and titrate as needed for heart rate control.  Cardiology also recommends continue IV diuresis with Lasix for now.  She continues to be volume overloaded so they are recommending continue IV diuresis and because her heart rates still in the 120s the diltiazem drip is going to be adjusted and she will be increased on her Toprol-XL to 50 mg p.o. daily.  PT OT recommending SNF when she is stable for discharge  04/19/2020 Patient's heart rate is improved today but her white blood cell count went up significantly and patient has been noted to be coughing when eating meals and drinking and there is concern for aspiration so an SLP evaluation was done.  Panculture was also initiated given her elevated WBC.  We will obtain a chest x-ray, urine culture and urinalysis as well as blood cultures.  We will hold off on empiric antibiotics at this time and continue to monitor her WBC.  Speech therapy evaluated and recommending a  dysphagia 2 diet with nectar thick liquids.  Cardiology further increased Toprol-XL to 75 mg p.o. daily and continue to recommending diuresis  04/20/20 Heart rate was better controlled today and she has been weaned off the Cardizem drip to p.o. Cardizem by cardiology. Cardiology still feel that she needs IV diuresis so we will continue this. Cardiology in the process of changing her from amiodarone drip to 30 mg of diltiazem every 6h. In the interim her leukocytosis has improved and will continue IV Unasyn for concern for aspiration.  Assessment & Plan:   Principal Problem:   Acute hypoxemic respiratory failure (HCC) Active Problems:   Atrial fibrillation with RVR (HCC)   Essential hypertension   CKD (chronic kidney disease), stage III   Pressure injury of skin   Acute on chronic diastolic heart failure (HCC)  Acute Respiratory Failure with Hypoxia Secondary to acute on chronic diastolic CHF/ less likely pneumonia?,  But patient may have aspirated -Patient is requiring 3 L of oxygen via nasal cannula and will attempt to wean when she becomes less volume overloaded.   -She continues leg swelling and weight gain and some orthopnea.  -Reviewed chest x-ray.  BNP: 528.  COVID-19 Negative . -Patient received Lasix 20 mg, Rocephin and azithromycin in ED. -Admit patient at stepdown unit for close monitoring.  -SpO2: 96 % O2 Flow Rate (L/min): 3 L/min -On continuous pulse ox.  We will try to wean off of oxygen as tolerated. -Start on Lasix 40 IV twice every 12 hours and will continue again today per cardiology recommendations.  Strict INO's and daily weight. -Patient is -3.848 L since admission and weight was down 3 pounds but now is gone up and she is 110 pounds up from 100 and a question of weights are accurate -Will Fluid Restrict to 1500 mL -No transthoracic echo in the system but have ordered one and has been done -Check TSH and was 1.144.  Check procalcitonin level and was 1.23 and trended  down to 0.51 -Hold off Further  antibiotics at this time-as chest x-ray findings are likely secondary to fluid overload as patient has been afebrile; she did spike a WBC and WBC went from 9.4 is now 17.6 we will pan-culture -Chest x-ray today showed "Stable cardiomegaly. Stable interstitial densities are noted throughout both lungs which may represent edema or possibly atypical inflammation. Small left pleural effusion is noted. Aortic Atherosclerosis."  -We will empirically start the patient on IV Unasyn given her concern for aspiration and will continue for 5 days; blood cultures initially done showed no growth at 4 days still and repeat yesterday given concern for aspiration elevated white blood cell count is still pending -Urinalysis was unremarkable and Urine Cx pending  -If WBC persist may consider obtaining a CT scan of the chest -Monitor electrolytes and kidney function closely. -SLP evaluated and recommending dysphagia 2 diet with nectar thick liquids; Will Fluid Restrict to 1500 mL as above  -DuoNebs as needed but may need to change to Xopenex/Atrovent given her A. fib -Continue to monitor for signs and symptoms of volume overload -We will need an ambulatory O2 screen prior to discharge as well as a PT OT evaluation.  T OT recommending SNF -Continue  diuresis per cardiology and they have also changed some medications around and increased her metoprolol -Cardiology feels that her diastolic CHF is likely exacerbated by her elevated heart rate and they feel that she needs good heart rate control and strict low-sodium diet -See below for further treatment  Persistent A. Fib with RVR -Received metoprolol 5 mg once in ED. -Recently has been seeing her PCP who have been adjusting medication and she has had prior to admission medications with metoprolol XL 25 mg p.o. daily, diltiazem 180 mg p.o. daily, hydroxyzine 0.125 mg daily and she is currently anticoagulated with low-dose Eliquis -In the  ED she was given IV metoprolol and IV diltiazem. -Continue with anticoagulation with low-dose Eliquis -Cardiology recommending holding hydralazine and amlodipine to allow for greater blood pressure and continue diltiazem for now and titrate stop transition to p.o. -CHA2DS2-VASc is at least a 5 for Age, Sex, HTN, and CHF Hx -Monitor heart rate closely as she continued to have heart rates in the 120s however it is better today with heart rates in the 80s and 90s and so cardiology has transitioned her or is in the process of transitioning her to p.o. diltiazem 30 mg every 6 hours scheduled -Cardiology also recommend restarting Toprol-XL 25 mg p.o. daily and now further titrated up to 75 mg p.o. daily -Appreciate further care per cardiology and they stopped the digoxin due to advanced age and stop amlodipine as she is already on a calcium channel blocker with Cardizem; they feel will be very difficult to convert to normal sinus rhythm and maintain rhythm given her severe bilateral atrial enlargement so they would recommend continue rate control -Cardiology still working on controlling her heart rate and she is currently not stable for discharge as she is also still remains somewhat volume overloaded  Acquired Thrombophilia -CHA2DS2-VASc is at least a 5 for Age, Sex, HTN, and CHF Hx -Continue with low-dose Eliquis 2.5 mg p.o. twice daily given that her age is above 6 and that her weight is below 60 kg  Hypertension -On the softer side but now improved -Continue diltiazem drip -Cardiology recommending holding hydralazine and amlodipine to allow for greater blood pressure -Monitor blood pressure closely as last blood pressure was 125/69  Hyperlipidemia -Continue Atorvastatin 20 mg po Daily   Pulmonary Hypertension  -Moderate and likely group 2 related to pulmonary versus hypotension from CHF -She has moderate to severe tricuspid regurg and a right ventricular function is mildly  reduced -Cardiology recommending continue IV diuretics when she is getting IV 40 twice daily  Metabolic Acidosis, improved -The patient's CO2 initially was 21, anion gap was 14, chloride level was 104; her CO2 is now 33, chloride level is 96, anion gap is 9 -We will continue to monitor and trend and repeat CMP in a.m.  Normocytic Anemia -H&H: 10.1/32.5 on admission was 13.2/41.28 months ago; repeat showed a hemoglobin/hematocrit of 8.9/20.9 today -No active bleeding.  Monitor H&H closely.  Transfuse as needed. -Check anemia panel and it showed an iron level of 20, U IBC 239, TIBC 259, saturation ratios of 8%, ferritin level of 62, folate of 26.5, vitamin B12 470 -Continue to monitor for signs and symptoms of bleeding; currently no overt bleeding with  -Repeat CBC in a.m.  AKI on CKD stage IIIb, stable and improving -Creatinine: 1.36, GFR: 35 (previous creatinine: 1.89, GFR: 58) -BUN/creatinine is now improved and trended down and is 22/1.21 -Continue to avoid nephrotoxic medications, contrast dyes, hypotension and renally adjust medications -Repeat CMP in  a.m.  Hypokalemia -The patient potassium this morning is 3.9 -Continue to monitor and replete as necessary -Repeat CMP in a.m.  DVT prophylaxis: Anticoagulated with Apixaban 2.5 mg po BID  Code Status: FULL CODE  Family Communication: Daughter was at bedside Disposition Plan: Pending cardiac clearance and titration off of Cardizem gtt and diuresis back to dry weight; she will need SNF  Status is: Inpatient  Remains inpatient appropriate because:Ongoing diagnostic testing needed not appropriate for outpatient work up, Unsafe d/c plan, IV treatments appropriate due to intensity of illness or inability to take PO and Inpatient level of care appropriate due to severity of illness   Dispo: The patient is from: Home              Anticipated d/c is to: SNF              Anticipated d/c date is: 2 days              Patient currently is  not medically stable to d/c.  Consultants:   Cardiology   Procedures: ECHOCARDIOGRAM IMPRESSIONS    1. Left ventricular ejection fraction, by estimation, is 50 to 55%. The  left ventricle has low normal function. The left ventricle has no regional  wall motion abnormalities. There is mild concentric left ventricular  hypertrophy. Left ventricular  diastolic parameters are indeterminate.  2. Right ventricular systolic function is mildly reduced. The right  ventricular size is normal. There is moderately elevated pulmonary artery  systolic pressure.  3. Left atrial size was severely dilated.  4. Right atrial size was severely dilated.  5. The mitral valve is normal in structure. Mild mitral valve  regurgitation. No evidence of mitral stenosis.  6. Tricuspid valve regurgitation is moderate to severe.  7. The aortic valve is normal in structure. Aortic valve regurgitation is  not visualized. No aortic stenosis is present.  8. The inferior vena cava is normal in size with greater than 50%  respiratory variability, suggesting right atrial pressure of 3 mmHg.   FINDINGS  Left Ventricle: Left ventricular ejection fraction, by estimation, is 50  to 55%. The left ventricle has low normal function. The left ventricle has  no regional wall motion abnormalities. The left ventricular internal  cavity size was normal in size.  There is mild concentric left ventricular hypertrophy. Left ventricular  diastolic parameters are indeterminate.   Right Ventricle: The right ventricular size is normal. No increase in  right ventricular wall thickness. Right ventricular systolic function is  mildly reduced. There is moderately elevated pulmonary artery systolic  pressure. The tricuspid regurgitant  velocity is 3.30 m/s, and with an assumed right atrial pressure of 3 mmHg,  the estimated right ventricular systolic pressure is 95.0 mmHg.   Left Atrium: Left atrial size was severely  dilated.   Right Atrium: Right atrial size was severely dilated.   Pericardium: There is no evidence of pericardial effusion.   Mitral Valve: The mitral valve is normal in structure. Normal mobility of  the mitral valve leaflets. Mild mitral annular calcification. Mild mitral  valve regurgitation. No evidence of mitral valve stenosis.   Tricuspid Valve: The tricuspid valve is normal in structure. Tricuspid  valve regurgitation is moderate to severe. No evidence of tricuspid  stenosis.   Aortic Valve: The aortic valve is normal in structure.. There is moderate  thickening and moderate calcification of the aortic valve. Aortic valve  regurgitation is not visualized. No aortic stenosis is present. There is  moderate thickening  of the aortic  valve. There is moderate calcification of the aortic valve. Aortic valve  mean gradient measures 4.0 mmHg. Aortic valve peak gradient measures 7.2  mmHg. Aortic valve area, by VTI measures 1.20 cm.   Pulmonic Valve: The pulmonic valve was normal in structure. Pulmonic valve  regurgitation is not visualized. No evidence of pulmonic stenosis.   Aorta: The aortic root is normal in size and structure.   Venous: The inferior vena cava is normal in size with greater than 50%  respiratory variability, suggesting right atrial pressure of 3 mmHg.   IAS/Shunts: No atrial level shunt detected by color flow Doppler.     LEFT VENTRICLE  PLAX 2D  LVIDd:     3.80 cm  LVIDs:     2.80 cm  LV PW:     1.20 cm  LV IVS:    1.37 cm  LVOT diam:   1.80 cm  LV SV:     32  LV SV Index:  22  LVOT Area:   2.54 cm     RIGHT VENTRICLE      IVC  RV Basal diam: 3.60 cm  IVC diam: 1.00 cm  RV Mid diam:  2.90 cm  RV S prime:   5.43 cm/s  TAPSE (M-mode): 0.9 cm   LEFT ATRIUM       Index    RIGHT ATRIUM      Index  LA diam:    3.80 cm 2.60 cm/m RA Area:   24.10 cm  LA Vol (A2C):  62.6 ml 42.84 ml/m  RA Volume:  67.90 ml 46.46 ml/m  LA Vol (A4C):  50.0 ml 34.22 ml/m  LA Biplane Vol: 57.6 ml 39.42 ml/m  AORTIC VALVE  AV Area (Vmax):  1.43 cm  AV Area (Vmean):  1.22 cm  AV Area (VTI):   1.20 cm  AV Vmax:      134.00 cm/s  AV Vmean:     97.300 cm/s  AV VTI:      0.269 m  AV Peak Grad:   7.2 mmHg  AV Mean Grad:   4.0 mmHg  LVOT Vmax:     75.10 cm/s  LVOT Vmean:    46.500 cm/s  LVOT VTI:     0.127 m  LVOT/AV VTI ratio: 0.47    AORTA  Ao Root diam: 3.10 cm  Ao Asc diam: 3.00 cm   TRICUSPID VALVE  TR Peak grad:  43.6 mmHg  TR Vmax:    330.00 cm/s    SHUNTS  Systemic VTI: 0.13 m  Systemic Diam: 1.80 cm   Antimicrobials:  Anti-infectives (From admission, onward)   Start     Dose/Rate Route Frequency Ordered Stop   04/19/20 1600  Ampicillin-Sulbactam (UNASYN) 3 g in sodium chloride 0.9 % 100 mL IVPB     Discontinue     3 g 200 mL/hr over 30 Minutes Intravenous Every 12 hours 04/19/20 1520     04/15/20 1630  cefTRIAXone (ROCEPHIN) 1 g in sodium chloride 0.9 % 100 mL IVPB        1 g 200 mL/hr over 30 Minutes Intravenous  Once 04/15/20 1620 04/15/20 1920   04/15/20 1630  azithromycin (ZITHROMAX) 500 mg in sodium chloride 0.9 % 250 mL IVPB        500 mg 250 mL/hr over 60 Minutes Intravenous  Once 04/15/20 1620 04/15/20 2046      Subjective: Seen and examined at bedside and she denies any chest pain, shortness breath,  lightheadedness or dizziness.  Daughter states that she is been eating but patient was feeling sleepy and wanted to take a nap.  No other concerns or complaints at this time.  Objective: Vitals:   04/19/20 1943 04/19/20 2355 04/20/20 0354 04/20/20 1000  BP: 115/83 106/78 125/69   Pulse: 91 72 83 (!) 153  Resp: 16 18 12  (!) 22  Temp: 99 F (37.2 C) 99.1 F (37.3 C) 98.9 F (37.2 C)   TempSrc: Axillary Axillary Axillary   SpO2: 98% 94% 97% 96%  Weight:   49.9 kg   Height:        Intake/Output  Summary (Last 24 hours) at 04/20/2020 1236 Last data filed at 04/20/2020 1100 Gross per 24 hour  Intake 963.37 ml  Output 450 ml  Net 513.37 ml   Filed Weights   04/17/20 0118 04/18/20 0413 04/20/20 0354  Weight: 45.4 kg 44.3 kg 49.9 kg   Examination: Physical Exam:  Constitutional: WN/WD early Caucasian female currently no acute distress but appears very sleepy and wanted to res Eyes: Lids and conjunctivae normal, sclerae anicteric  ENMT: External Ears, Nose appear normal.  Is hard of hearing.  Neck: Appears normal, supple, no cervical masses, normal ROM, no appreciable thyromegaly no appreciable JVD Respiratory: Diminished to auscultation bilaterally with coarse breath sounds and some slight crackles at the bases but no appreciable no wheezing, rales, rhonchi. Normal respiratory effort and patient is not tachypenic. No accessory muscle use.  Wearing supplemental oxygen via nasal cannula Cardiovascular: Irregularly irregular but is not tachycardic today, no murmurs / rubs / gallops. S1 and S2 auscultated.  Has 1+ extremity edema Abdomen: Soft, non-tender, non-distended. Bowel sounds positive.  GU: Deferred. Musculoskeletal: No clubbing / cyanosis of digits/nails. No joint deformity upper and lower extremities.  Skin: No rashes, lesions, ulcers on limited skin evaluation. No induration; Warm and dry.  Neurologic: CN 2-12 grossly intact with no focal deficits. Romberg sign and cerebellar reflexes not assessed.  Psychiatric: Normal judgment and insight.  She is appearing sleepy but she is awake.  Normal mood and appropriate affect.   Data Reviewed: I have personally reviewed following labs and imaging studies  CBC: Recent Labs  Lab 04/15/20 1541 04/17/20 0846 04/18/20 0319 04/19/20 0658 04/20/20 0548  WBC 9.0 10.0 9.4 17.6* 8.9  NEUTROABS 7.8* 8.6* 7.3 15.8* 6.9  HGB 10.1* 9.5* 9.5* 9.6* 8.9*  HCT 32.5* 30.5* 29.6* 29.8* 28.9*  MCV 84.0 83.3 81.8 81.0 82.3  PLT 374 295 283 269  409   Basic Metabolic Panel: Recent Labs  Lab 04/15/20 1622 04/15/20 1622 04/16/20 0330 04/17/20 0846 04/18/20 0319 04/19/20 0658 04/20/20 0548  NA 139   < > 139 139 137 137 138  K 4.2   < > 4.0 3.5 3.6 3.2* 3.9  CL 106   < > 104 103 98 95* 96*  CO2 22   < > 21* 26 30 31  33*  GLUCOSE 108*   < > 95 91 101* 107* 94  BUN 15   < > 14 18 25* 24* 22  CREATININE 1.36*   < > 1.19* 1.43* 1.45* 1.23* 1.21*  CALCIUM 8.6*   < > 8.2* 8.1* 8.2* 8.7* 8.5*  MG 2.5*  --   --  1.8 1.7 1.7 2.2  PHOS  --   --   --  3.5 3.7 3.5 3.9   < > = values in this interval not displayed.   GFR: Estimated Creatinine Clearance: 23.5 mL/min (A) (by C-G formula  based on SCr of 1.21 mg/dL (H)). Liver Function Tests: Recent Labs  Lab 04/15/20 1622 04/17/20 0846 04/18/20 0319 04/19/20 0658 04/20/20 0548  AST 22 18 19 24 26   ALT 18 15 15 16 17   ALKPHOS 70 57 52 52 52  BILITOT 0.6 0.5 0.7 0.8 0.7  PROT 7.2 6.3* 6.3* 6.3* 6.2*  ALBUMIN 2.5* 2.3* 2.2* 2.3* 2.1*   No results for input(s): LIPASE, AMYLASE in the last 168 hours. No results for input(s): AMMONIA in the last 168 hours. Coagulation Profile: Recent Labs  Lab 04/15/20 1629  INR 1.5*   Cardiac Enzymes: No results for input(s): CKTOTAL, CKMB, CKMBINDEX, TROPONINI in the last 168 hours. BNP (last 3 results) No results for input(s): PROBNP in the last 8760 hours. HbA1C: No results for input(s): HGBA1C in the last 72 hours. CBG: No results for input(s): GLUCAP in the last 168 hours. Lipid Profile: No results for input(s): CHOL, HDL, LDLCALC, TRIG, CHOLHDL, LDLDIRECT in the last 72 hours. Thyroid Function Tests: No results for input(s): TSH, T4TOTAL, FREET4, T3FREE, THYROIDAB in the last 72 hours. Anemia Panel: No results for input(s): VITAMINB12, FOLATE, FERRITIN, TIBC, IRON, RETICCTPCT in the last 72 hours. Sepsis Labs: Recent Labs  Lab 04/15/20 2044 04/15/20 2055 04/16/20 0330 04/17/20 0846  PROCALCITON 1.23  --  1.06 0.51   LATICACIDVEN 1.2 0.7  --   --     Recent Results (from the past 240 hour(s))  Blood culture (routine x 2)     Status: None (Preliminary result)   Collection Time: 04/15/20  4:25 PM   Specimen: BLOOD RIGHT HAND  Result Value Ref Range Status   Specimen Description BLOOD RIGHT HAND  Final   Special Requests   Final    BOTTLES DRAWN AEROBIC AND ANAEROBIC Blood Culture results may not be optimal due to an inadequate volume of blood received in culture bottles   Culture   Final    NO GROWTH 4 DAYS Performed at [facility], [place] [address] [place], [place] [address]    Report Status PENDING  Incomplete  Blood culture (routine x 2)     Status: None (Preliminary result)   Collection Time: 04/15/20  4:40 PM   Specimen: BLOOD RIGHT ARM  Result Value Ref Range Status   Specimen Description BLOOD RIGHT ARM  Final   Special Requests   Final    BOTTLES DRAWN AEROBIC AND ANAEROBIC Blood Culture results may not be optimal due to an excessive volume of blood received in culture bottles   Culture   Final    NO GROWTH 4 DAYS Performed at [facility], [place] [address] [place], [place] [address]    Report Status PENDING  Incomplete  SARS Coronavirus 2 by RT PCR (hospital order, performed in [facility] hospital lab) Nasopharyngeal Nasopharyngeal Swab     Status: None   Collection Time: 04/15/20  9:52 PM   Specimen: Nasopharyngeal Swab  Result Value Ref Range Status   SARS Coronavirus 2 NEGATIVE NEGATIVE Final    Comment: (NOTE) SARS-CoV-2 target nucleic acids are NOT DETECTED.  The SARS-CoV-2 RNA is generally detectable in upper and lower respiratory specimens during the acute phase of infection. The lowest concentration of SARS-CoV-2 viral copies this assay can detect is 250 copies / mL. A negative result does not preclude SARS-CoV-2 infection and should not be used as the sole basis for treatment or other patient management decisions.  A negative result may occur with improper  specimen collection /  handling, submission of specimen other than nasopharyngeal swab, presence of viral mutation(s) within the areas targeted by this assay, and inadequate number of viral copies (<250 copies / mL). A negative result must be combined with clinical observations, patient history, and epidemiological information.  Fact Sheet for Patients:   StrictlyIdeas.no  Fact Sheet for Healthcare Providers: BankingDealers.co.za  This test is not yet approved or  cleared by the [place] FDA and has been authorized for detection and/or diagnosis of SARS-CoV-2 by FDA under an Emergency Use Authorization (EUA).  This EUA will remain in effect (meaning this test can be used) for the duration of the COVID-19 declaration under Section 564(b)(1) of the Act, 21 U.S.C. section 360bbb-3(b)(1), unless the authorization is terminated or revoked sooner.  Performed at [facility], [place] [address], [place], [place] [address]      RN Pressure Injury Documentation: Pressure Injury 04/17/20 Buttocks Left Stage 2 -  Partial thickness loss of dermis presenting as a shallow open injury with a red, pink wound bed without slough. (Active)  04/17/20 0132  Location: Buttocks  Location Orientation: Left  Staging: Stage 2 -  Partial thickness loss of dermis presenting as a shallow open injury with a red, pink wound bed without slough.  Wound Description (Comments):   Present on Admission:      Estimated body mass index is 21.48 kg/m as calculated from the following:   Height as of this encounter: 5' (1.524 m).   Weight as of this encounter: 49.9 kg.  Malnutrition Type:  Nutrition Problem: Severe Malnutrition Etiology: chronic illness (CHF)   Malnutrition Characteristics:  Signs/Symptoms: severe muscle depletion, severe fat depletion   Nutrition Interventions:  Interventions: Ensure Enlive (each supplement provides 350kcal and 20 grams  of protein), MVI, Magic cup Radiology Studies: DG CHEST PORT 1 VIEW  Result Date: 04/19/2020 CLINICAL DATA:  Cardiomegaly. EXAM: PORTABLE CHEST 1 VIEW COMPARISON:  April 17, 2020. FINDINGS: Stable cardiomegaly. No pneumothorax is noted. Stable interstitial densities are noted throughout both lungs which may represent edema or possibly atypical inflammation. Small left pleural effusion is noted. Bony thorax is unremarkable. IMPRESSION: Stable cardiomegaly. Stable interstitial densities are noted throughout both lungs which may represent edema or possibly atypical inflammation. Small left pleural effusion is noted. Aortic Atherosclerosis (ICD10-I70.0). Electronically Signed   By: Marijo Conception M.D.   On: 04/19/2020 12:42   Scheduled Meds: . apixaban  2.5 mg Oral BID  . atorvastatin  20 mg Oral Daily  . brimonidine  1 drop Right Eye BID  . diltiazem  30 mg Oral Q6H  . feeding supplement (ENSURE ENLIVE)  237 mL Oral BID BM  . furosemide  40 mg Intravenous Q12H  . latanoprost  1 drop Both Eyes Q1200  . mouth rinse  15 mL Mouth Rinse BID  . metoprolol succinate  75 mg Oral Daily  . multivitamin with minerals  1 tablet Oral Daily  . Netarsudil Dimesylate  1 drop Right Eye Daily  . sodium chloride flush  3 mL Intravenous Q12H   Continuous Infusions: . sodium chloride Stopped (04/19/20 1631)  . ampicillin-sulbactam (UNASYN) IV 3 g (04/20/20 0430)    LOS: 5 days   Kerney Elbe, DO [facility] PAG[facility] is on [place]  If 7PM-7AM, please contact night-coverage [email or web address]

## 2020-04-20 NOTE — Progress Notes (Signed)
SLP Cancellation Note  Patient Details Name: Kelli Peterson MRN: [id number] DOB: Sep 21, 1930   Cancelled treatment:       Reason Eval/Treat Not Completed: Other (comment). Pt on bedside commode, awaiting clean up assist from staff. Daughter at bedside reports good tolerance of diet and nectar thick liquids. Intake with meals has been good. Daughter did report pt has refused thickened water. Started to discuss sips of thin water outside of meals, but other providers showed up and pt still on commode. Will f/u at a later time.    Kazandra Forstrom, Katherene Ponto 04/20/2020, 9:45 AM

## 2020-04-21 ENCOUNTER — Encounter (HOSPITAL_COMMUNITY): Payer: Self-pay | Admitting: Internal Medicine

## 2020-04-21 ENCOUNTER — Inpatient Hospital Stay (HOSPITAL_COMMUNITY): Payer: Medicare Other

## 2020-04-21 DIAGNOSIS — J9601 Acute respiratory failure with hypoxia: Secondary | ICD-10-CM

## 2020-04-21 LAB — CBC WITH DIFFERENTIAL/PLATELET
Abs Immature Granulocytes: 0.03 10*3/uL (ref 0.00–0.07)
Basophils Absolute: 0 10*3/uL (ref 0.0–0.1)
Basophils Relative: 0 %
Eosinophils Absolute: 0.4 10*3/uL (ref 0.0–0.5)
Eosinophils Relative: 5 %
HCT: 30.9 % — ABNORMAL LOW (ref 36.0–46.0)
Hemoglobin: 9.5 g/dL — ABNORMAL LOW (ref 12.0–15.0)
Immature Granulocytes: 0 %
Lymphocytes Relative: 10 %
Lymphs Abs: 0.7 10*3/uL (ref 0.7–4.0)
MCH: 25.3 pg — ABNORMAL LOW (ref 26.0–34.0)
MCHC: 30.7 g/dL (ref 30.0–36.0)
MCV: 82.4 fL (ref 80.0–100.0)
Monocytes Absolute: 0.9 10*3/uL (ref 0.1–1.0)
Monocytes Relative: 11 %
Neutro Abs: 5.7 10*3/uL (ref 1.7–7.7)
Neutrophils Relative %: 74 %
Platelets: 237 10*3/uL (ref 150–400)
RBC: 3.75 MIL/uL — ABNORMAL LOW (ref 3.87–5.11)
RDW: 18.3 % — ABNORMAL HIGH (ref 11.5–15.5)
WBC: 7.7 10*3/uL (ref 4.0–10.5)
nRBC: 0 % (ref 0.0–0.2)

## 2020-04-21 LAB — COMPREHENSIVE METABOLIC PANEL
ALT: 24 U/L (ref 0–44)
AST: 35 U/L (ref 15–41)
Albumin: 2.2 g/dL — ABNORMAL LOW (ref 3.5–5.0)
Alkaline Phosphatase: 47 U/L (ref 38–126)
Anion gap: 9 (ref 5–15)
BUN: 19 mg/dL (ref 8–23)
CO2: 35 mmol/L — ABNORMAL HIGH (ref 22–32)
Calcium: 8.7 mg/dL — ABNORMAL LOW (ref 8.9–10.3)
Chloride: 95 mmol/L — ABNORMAL LOW (ref 98–111)
Creatinine, Ser: 1.13 mg/dL — ABNORMAL HIGH (ref 0.44–1.00)
GFR calc Af Amer: 51 mL/min — ABNORMAL LOW (ref >60)
GFR calc non Af Amer: 44 mL/min — ABNORMAL LOW (ref >60)
Glucose, Bld: 94 mg/dL (ref 70–99)
Potassium: 3.6 mmol/L (ref 3.5–5.1)
Sodium: 139 mmol/L (ref 135–145)
Total Bilirubin: 0.7 mg/dL (ref 0.3–1.2)
Total Protein: 6.7 g/dL (ref 6.5–8.1)

## 2020-04-21 LAB — PHOSPHORUS: Phosphorus: 3.2 mg/dL (ref 2.5–4.6)

## 2020-04-21 LAB — MAGNESIUM: Magnesium: 1.9 mg/dL (ref 1.7–2.4)

## 2020-04-21 NOTE — Progress Notes (Signed)
Progress Note  Patient Name: Kelli Peterson Date of Encounter: 04/21/2020  Primary Cardiologist: No primary care provider on file.   Subjective   Overall feeling well, no complaints. Seen with her daughter present at the bedside.  Inpatient Medications    Scheduled Meds: . apixaban  2.5 mg Oral BID  . atorvastatin  20 mg Oral Daily  . brimonidine  1 drop Right Eye BID  . diltiazem  30 mg Oral Q6H  . feeding supplement (ENSURE ENLIVE)  237 mL Oral BID BM  . furosemide  40 mg Intravenous Q12H  . latanoprost  1 drop Both Eyes Q1200  . mouth rinse  15 mL Mouth Rinse BID  . metoprolol succinate  75 mg Oral Daily  . multivitamin with minerals  1 tablet Oral Daily  . Netarsudil Dimesylate  1 drop Right Eye Daily  . sodium chloride flush  3 mL Intravenous Q12H   Continuous Infusions: . sodium chloride 250 mL (04/21/20 0439)  . ampicillin-sulbactam (UNASYN) IV 3 g (04/21/20 1525)   PRN Meds: sodium chloride, acetaminophen, ipratropium-albuterol, ondansetron (ZOFRAN) IV, Resource ThickenUp Clear, sodium chloride flush   Vital Signs    Vitals:   04/21/20 0749 04/21/20 1000 04/21/20 1100 04/21/20 1322  BP: 128/67  131/82   Pulse: 95 83 (!) 112 (!) 102  Resp: 20 18 18    Temp: 97.6 F (36.4 C)  97.7 F (36.5 C)   TempSrc: Oral  Oral   SpO2: 95% (!) 87% 95% 94%  Weight:      Height:        Intake/Output Summary (Last 24 hours) at 04/21/2020 1534 Last data filed at 04/21/2020 1300 Gross per 24 hour  Intake 1133.85 ml  Output 700 ml  Net 433.85 ml   Filed Weights   04/18/20 0413 04/20/20 0354 04/21/20 0443  Weight: 44.3 kg 49.9 kg 48.8 kg    Telemetry    AF - Personally Reviewed  ECG    afib RVR 04/15/20 - Personally Reviewed  Physical Exam   GEN: No acute distress.   Neck: No JVD Cardiac: irregular rhythm, normal rate, no murmurs, rubs, or gallops.  Respiratory: faint basilar crackles GI: Soft, nontender, non-distended  MS: No edema; No  deformity. Neuro:  Nonfocal  Psych: Normal affect   Labs    Chemistry Recent Labs  Lab 04/19/20 0658 04/20/20 0548 04/21/20 0524  NA 137 138 139  K 3.2* 3.9 3.6  CL 95* 96* 95*  CO2 31 33* 35*  GLUCOSE 107* 94 94  BUN 24* 22 19  CREATININE 1.23* 1.21* 1.13*  CALCIUM 8.7* 8.5* 8.7*  PROT 6.3* 6.2* 6.7  ALBUMIN 2.3* 2.1* 2.2*  AST 24 26 35  ALT 16 17 24   ALKPHOS 52 52 47  BILITOT 0.8 0.7 0.7  GFRNONAA 39* 40* 44*  GFRAA 46* 47* 51*  ANIONGAP 11 9 9      Hematology Recent Labs  Lab 04/19/20 0658 04/20/20 0548 04/21/20 0524  WBC 17.6* 8.9 7.7  RBC 3.68* 3.51* 3.75*  HGB 9.6* 8.9* 9.5*  HCT 29.8* 28.9* 30.9*  MCV 81.0 82.3 82.4  MCH 26.1 25.4* 25.3*  MCHC 32.2 30.8 30.7  RDW 18.1* 18.1* 18.3*  PLT 269 225 237    Cardiac EnzymesNo results for input(s): TROPONINI in the last 168 hours. No results for input(s): TROPIPOC in the last 168 hours.   BNP Recent Labs  Lab 04/15/20 1541  BNP 528.5*     DDimer No results for input(s): DDIMER  in the last 168 hours.   Radiology    DG CHEST PORT 1 VIEW  Result Date: 04/21/2020 CLINICAL DATA:  Shortness of breath EXAM: PORTABLE CHEST 1 VIEW COMPARISON:  04/20/2020 FINDINGS: Increased interstitial prominence and patchy density at the left lung base is without substantial change. No significant pleural effusion. No pneumothorax. Stable cardiomediastinal contours. IMPRESSION: No substantial change since 04/20/2020 with persistent increased interstitial prominence and patchy density at the left lung base. Electronically Signed   By: Macy Mis M.D.   On: 04/21/2020 08:20   DG CHEST PORT 1 VIEW  Result Date: 04/20/2020 CLINICAL DATA:  Evaluate Stable cardiomegaly. Stable interstitial densities are noted throughout both lungs which may represent edema or possibly atypical inflammation EXAM: PORTABLE CHEST 1 VIEW COMPARISON:  Chest radiograph 04/19/2020 FINDINGS: Stable cardiomediastinal contours. Aortic atherosclerotic  calcification. Persistent diffuse bilateral interstitial opacities. Small bandlike opacity at the left base likely atelectasis. No pneumothorax or large pleural effusion. No acute finding in the visualized skeleton. Old right rib fracture. IMPRESSION: Unchanged diffuse bilateral interstitial opacities. Probable mild left basilar atelectasis. Electronically Signed   By: Audie Pinto M.D.   On: 04/20/2020 15:43    Cardiac Studies   Echo:   1. Left ventricular ejection fraction, by estimation, is 50 to 55%. The  left ventricle has low normal function. The left ventricle has no regional  wall motion abnormalities. There is mild concentric left ventricular  hypertrophy. Left ventricular  diastolic parameters are indeterminate.   2. Right ventricular systolic function is mildly reduced. The right  ventricular size is normal. There is moderately elevated pulmonary artery  systolic pressure.   3. Left atrial size was severely dilated.   4. Right atrial size was severely dilated.   5. The mitral valve is normal in structure. Mild mitral valve  regurgitation. No evidence of mitral stenosis.   6. Tricuspid valve regurgitation is moderate to severe.   7. The aortic valve is normal in structure. Aortic valve regurgitation is  not visualized. No aortic stenosis is present.   8. The inferior vena cava is normal in size with greater than 50%  respiratory variability, suggesting right atrial pressure of 3 mmHg.    Patient Profile       86 y.o. female with a hx of AF on Eliquis, HTN, HLD, chronic diastolic CHFwho is being seen for the evaluation ofatrial fibrillation and CHF exacerbation.  Assessment & Plan   Principal Problem:   Acute hypoxemic respiratory failure (HCC) Active Problems:   Atrial fibrillation with RVR (HCC)   Essential hypertension   CKD (chronic kidney disease), stage III   Pressure injury of skin   Acute on chronic diastolic heart failure ([place])   1.  Chronic atrial  fibrillation now with RVR, severe biatrial enlargement -she has had chronic afib managed with CCB and BB as well as Eliquis by PCP -TSH normal -Stopped digoxin due to advanced age.  -Stopped amlodipine since she is already on CCB  -HR much improved after increasing BB dose further and in the 80-90's but still at times goes up to 120's transiently -continue Toprol XL to 75mg  daily - she just received this dose when I saw her, rates were around 110 bpm but waiting medication effect. -change IV Cardizem gtt to PO Cardizem 30mg  q6 hours and titrate as needed for HR control -Continue apixaban 2.5mg  BID (dosed for age>80, weight <60kg).   2. Acute on chronic diastolic CHF -2D echo this admit with EF 50-55% with mild LVH -  she is currently on IV Lasix but I&O's incomplete over last 24 hr.  She is at least net neg 3.4L since admit, near net even today.  -Creatinine improved with diuresis, now 1.13 -continue Lasix 40mg  IV BID and follow strict I&O's, daily weights and renal function - likely transition to oral diuretic tomorrow, lasix 40 mg po daily -likely exacerbated by elevated HR and needs good HR control and strict low Na diet  3. Moderate pulmonary HTN -likely Group 2 related to pulmonary venous HTN from CHF -TR moderate to severe and RVF mildly reduced -continue IV diuretics today, transition tomorrow.  4. HTN -BP stable at 125/28mmHg -continue Toprol and change IV Cardizem to PO 30mg  q6 hours -stopped amlodipine as she is already on a CCB -stopped Hydralazine to allow more BP to allow for uptitration of BB and CCB for HR control  5. AKI on CKD stage 2 -Creatinine improving with diuresis. -follow daily BMET  6.  Hypokalemia -K+ 3.6, replace.  For questions or updates, please contact [place] Please consult [email or web address] for contact info under        Signed, Elouise Munroe, MD  04/21/2020, 3:34 PM

## 2020-04-21 NOTE — Progress Notes (Signed)
PROGRESS NOTE    Kelli Peterson  TSV:[id number] DOB: July 21, 1930 DOA: 04/15/2020 PCP: Jani Gravel, MD  Brief Narrative:  HPI per Dr. Early Osmond on 04/15/20 Kelli Peterson is a 86 y.o. female with medical history significant of A. fib-on Eliquis, hypertension, hyperlipidemia, chronic diastolic CHF presents to emergency department with palpitation and shortness of breath since 3 weeks.  Patient's daughter at the bedside is the story and.  She tells me that patient has palpitation and shortness of breath since 3 weeks.  She was seen by her PCP and diltiazem dose was increased and and on follow-up appointment digoxin was added yesterday however due to persistent symptoms patient came to ER for further evaluation and management.  Patient's daughter reports leg swelling, weight gain, shortness of breath and decreased oxygen saturation in 70s and 80s.  Patient is not on home oxygen.  Patient cannot lie flat due to dizziness, she sleeps on a recliner.  She has chronic cough however denies wheezing, change in sputum color, fever, chills, nausea, vomiting, diarrhea, headache, blurry vision, chest pain, urinary or bowel changes.  She lives with her daughter at home.  Uses walker for ambulation.  No history of smoking, alcohol, street drug use.    ED Course: Upon arrival to ED: Patient's heart rate noted to be in the 130s, tachypneic, requiring 3 L of oxygen via nasal cannula.,  Afebrile with no leukocytosis, BNP: 528, digoxin level: 0.7, lactic acid, COVID-19, blood culture: Pending.  Chest x-ray shows interstitial prominence throughout both lungs, increased as compared to the prior exam.  Findings are nonspecific but may reflect interstitial edema or atypical/viral pneumonia.  Unchanged cardiomegaly.  Patient received metoprolol 5 mg, Lasix 20 mg and Rocephin and azithromycin in ED.  EDP consulted cardiology.  [facility] hospitalist consulted for admission for A. fib with RVR and new onset acute hypoxemic  respiratory failure.  **Interim History  She continues to remain slightly dyspneic and has been wearing oxygen.  Family states that they have noticed worsening swelling.  Has never seen a cardiologist but has had PCP try and manage.  Echocardiogram has been done and shows a left ventricular ejection fraction of 50 to 55% with the left ventricle having a low normal function there is mild concentric left ventricular hypertrophy.  Left ventricular diastolic parameters were indeterminate and she has a right ventricular size function that is mildly reduced with severely dilated left atrial size as well as right atrial size and tricuspid regurgitation is moderate to severe.  Cardiology evaluated and feels that they will not be able to convert her to normal sinus rhythm and maintain the rhythm so they can recommend rate control given her severe bilateral atrial enlargement.  Cardiology has adjusted her medications and they recommend continuing the Cardizem drip and restarting Toprol-XL 25 mg p.o. daily and titrate as needed for heart rate control.  Cardiology also recommends continue IV diuresis with Lasix for now.  She continues to be volume overloaded so they are recommending continue IV diuresis and because her heart rates still in the 120s the diltiazem drip is going to be adjusted and she will be increased on her Toprol-XL to 50 mg p.o. daily.  PT OT recommending SNF when she is stable for discharge  04/19/2020 Patient's heart rate is improved today but her white blood cell count went up significantly and patient has been noted to be coughing when eating meals and drinking and there is concern for aspiration so an SLP evaluation was done.  Panculture was also initiated given her elevated WBC.  We will obtain a chest x-ray, urine culture and urinalysis as well as blood cultures.  We will hold off on empiric antibiotics at this time and continue to monitor her WBC.  Speech therapy evaluated and recommending a  dysphagia 2 diet with nectar thick liquids.  Cardiology further increased Toprol-XL to 75 mg p.o. daily and continue to recommending diuresis  04/20/20 Heart rate was better controlled today and she has been weaned off the Cardizem drip to p.o. Cardizem by cardiology. Cardiology still feel that she needs IV diuresis so we will continue this. Cardiology in the process of changing her from amiodarone drip to 30 mg of diltiazem every 6h. In the interim her leukocytosis has improved and will continue IV Unasyn for concern for aspiration.  04/21/20 Heart rate continues to be relatively well controlled but still remains volume overloaded so she is continuing to be diuresed by cardiology.  Daughter states that she is much more talkative today.  No complaints of the patient.  Assessment & Plan:   Principal Problem:   Acute hypoxemic respiratory failure (HCC) Active Problems:   Atrial fibrillation with RVR (HCC)   Essential hypertension   CKD (chronic kidney disease), stage III   Pressure injury of skin   Acute on chronic diastolic heart failure (HCC)  Acute Respiratory Failure with Hypoxia Secondary to acute on chronic diastolic CHF/ less likely pneumonia?,  But patient may have aspirated -Patient is requiring 3 L of oxygen via nasal cannula and will attempt to wean when she becomes less volume overloaded.   -She continues leg swelling and weight gain and some orthopnea.  -Reviewed chest x-ray.  BNP: 528.  COVID-19 Negative . -Patient received Lasix 20 mg, Rocephin and azithromycin in ED. -Admit patient at stepdown unit for close monitoring.  -SpO2: 94 % O2 Flow Rate (L/min): 2 L/min -On continuous pulse ox.  We will try to wean off of oxygen as tolerated. -Start on Lasix 40 IV twice every 12 hours and will continue again today per cardiology recommendations.  Strict INO's and daily weight. -Patient is -3.414 L since admission and weight is up 7 pounds since 7/15 -Will Fluid Restrict to 1500 mL  -No transthoracic echo in the system but have ordered one and has been done; echo showed a left ventricular ejection fraction 50 to 55% with left ventricular having a low normal function and left ventricular has no regional wall more action abnormalities.  There is mild concentric left ventricular hypertrophy and left ventricular diastolic parameters were indeterminate and patient also had right ventricular systolic function is mildly reduced -Check TSH and was 1.144.  Check procalcitonin level and was 1.23 and trended down to 0.51 -Hold off Further  antibiotics at this time-as chest x-ray findings are likely secondary to fluid overload as patient has been afebrile; she did spike a WBC and WBC went from 9.4 is now 17.6 we will pan-culture -Chest x-ray today showed "Increased interstitial prominence and patchy density at the left lung base is without substantial change. No significant pleural effusion. No pneumothorax. Stable cardiomediastinal contours."  -We will empirically start the patient on IV Unasyn given her concern for aspiration and will continue for 5 days; today is day 3 out of 5; blood cultures initially done showed no growth at 5 days still and repeat blood cultures on 04/19/2020 showed no growth today at 2 days -Urinalysis was unremarkable and Urine Cx showed no growth -If WBC persist may consider  obtaining a CT scan of the chest -Monitor electrolytes and kidney function closely. -SLP evaluated and recommending dysphagia 2 diet with nectar thick liquids; Will Fluid Restrict to 1500 mL as above  -DuoNebs as needed but may need to change to Xopenex/Atrovent given her A. fib -Continue to monitor for signs and symptoms of volume overload -We will need an ambulatory O2 screen prior to discharge as well as a PT OT evaluation.  The PT OT evaluation is recommending skilled nursing facility -Continue diuresis per cardiology and they have also changed some medications around and increased her  metoprolol a few days ago -Cardiology feels that her diastolic CHF is likely exacerbated by her elevated heart rate and they feel that she needs good heart rate control and strict low-sodium diet -See below for further treatment  Persistent A. Fib with RVR -Received metoprolol 5 mg once in ED. -Recently has been seeing her PCP who have been adjusting medication and she has had prior to admission medications with metoprolol XL 25 mg p.o. daily, diltiazem 180 mg p.o. daily, hydroxyzine 0.125 mg daily and she is currently anticoagulated with low-dose Eliquis -In the ED she was given IV metoprolol and IV diltiazem. -Continue with anticoagulation with low-dose Eliquis -Cardiology recommending holding hydralazine and amlodipine to allow for greater blood pressure and continue diltiazem for now and titrate stop transition to p.o. -CHA2DS2-VASc is at least a 5 for Age, Sex, HTN, and CHF Hx -Monitor heart rate closely as she continued to have heart rates in the 120s however it is better today with heart rates in the 80s and 90s and so cardiology has transitioned her or is in the process of transitioning her to p.o. diltiazem 30 mg every 6 hours scheduled -Cardiology also recommend restarting Toprol-XL 25 mg p.o. daily and now further titrated up to 75 mg p.o. daily -Appreciate further care per cardiology and they stopped the digoxin due to advanced age and stop amlodipine as she is already on a calcium channel blocker with Cardizem; they feel will be very difficult to convert to normal sinus rhythm and maintain rhythm given her severe bilateral atrial enlargement so they would recommend continue rate control -Cardiology still working on controlling her heart rate and she is currently not stable for discharge as she is also still remains somewhat volume overloaded  Acquired Thrombophilia -CHA2DS2-VASc is at least a 5 for Age, Sex, HTN, and CHF Hx -Continue with low-dose Eliquis 2.5 mg p.o. twice daily given  that her age is above 44 and that her weight is below 60 kg  Hypertension -On the softer side but now improved -Continue diltiazem drip -Cardiology recommending holding hydralazine and amlodipine to allow for greater blood pressure -Monitor blood pressure closely as last blood pressure was 131/82  Hyperlipidemia -Continue Atorvastatin 20 mg po Daily   Pulmonary Hypertension  -Moderate and likely group 2 related to pulmonary versus hypotension from CHF -She has moderate to severe tricuspid regurg and a right ventricular function is mildly reduced -Cardiology recommending continue IV diuretics when she is getting IV 40 twice daily as above  Metabolic Acidosis, improved -The patient's CO2 initially was 21, anion gap was 14, chloride level was 104; her CO2 is now 35, chloride level is 94, anion gap is 9 -We will continue to monitor and trend and repeat CMP in a.m.  Normocytic Anemia -H&H: 10.1/32.5 on admission was 13.2/41.28 months ago; repeat showed a hemoglobin/hematocrit of 9.5/30.9 today -No active bleeding.  Monitor H&H closely.  Transfuse as needed. -Check  anemia panel and it showed an iron level of 20, U IBC 239, TIBC 259, saturation ratios of 8%, ferritin level of 62, folate of 26.5, vitamin B12 470 -Continue to monitor for signs and symptoms of bleeding; currently no overt bleeding with  -Repeat CBC in a.m.  AKI on CKD stage IIIb, stable and improving -Creatinine: 1.36, GFR: 35 (previous creatinine: 1.89, GFR: 58) -BUN/creatinine is now improved and trended down and is 19/1.13 -Continue to avoid nephrotoxic medications, contrast dyes, hypotension and renally adjust medications -Repeat CMP in a.m.  Hypokalemia -The patient potassium this morning is 3.6 -Continue to monitor and replete as necessary -Repeat CMP in a.m.  DVT prophylaxis: Anticoagulated with Apixaban 2.5 mg po BID  Code Status: FULL CODE  Family Communication: Daughter was at bedside Disposition Plan:  Pending cardiac clearance and titration off of Cardizem gtt and diuresis back to dry weight; she will need SNF; she is off the Cardizem drip but still continues to be diuresed  Status is: Inpatient  Remains inpatient appropriate because:Ongoing diagnostic testing needed not appropriate for outpatient work up, Unsafe d/c plan, IV treatments appropriate due to intensity of illness or inability to take PO and Inpatient level of care appropriate due to severity of illness   Dispo: The patient is from: Home              Anticipated d/c is to: SNF              Anticipated d/c date is: 2 days              Patient currently is not medically stable to d/c.  Consultants:   Cardiology   Procedures: ECHOCARDIOGRAM IMPRESSIONS    1. Left ventricular ejection fraction, by estimation, is 50 to 55%. The  left ventricle has low normal function. The left ventricle has no regional  wall motion abnormalities. There is mild concentric left ventricular  hypertrophy. Left ventricular  diastolic parameters are indeterminate.  2. Right ventricular systolic function is mildly reduced. The right  ventricular size is normal. There is moderately elevated pulmonary artery  systolic pressure.  3. Left atrial size was severely dilated.  4. Right atrial size was severely dilated.  5. The mitral valve is normal in structure. Mild mitral valve  regurgitation. No evidence of mitral stenosis.  6. Tricuspid valve regurgitation is moderate to severe.  7. The aortic valve is normal in structure. Aortic valve regurgitation is  not visualized. No aortic stenosis is present.  8. The inferior vena cava is normal in size with greater than 50%  respiratory variability, suggesting right atrial pressure of 3 mmHg.   FINDINGS  Left Ventricle: Left ventricular ejection fraction, by estimation, is 50  to 55%. The left ventricle has low normal function. The left ventricle has  no regional wall motion abnormalities.  The left ventricular internal  cavity size was normal in size.  There is mild concentric left ventricular hypertrophy. Left ventricular  diastolic parameters are indeterminate.   Right Ventricle: The right ventricular size is normal. No increase in  right ventricular wall thickness. Right ventricular systolic function is  mildly reduced. There is moderately elevated pulmonary artery systolic  pressure. The tricuspid regurgitant  velocity is 3.30 m/s, and with an assumed right atrial pressure of 3 mmHg,  the estimated right ventricular systolic pressure is 46.9 mmHg.   Left Atrium: Left atrial size was severely dilated.   Right Atrium: Right atrial size was severely dilated.   Pericardium: There is no  evidence of pericardial effusion.   Mitral Valve: The mitral valve is normal in structure. Normal mobility of  the mitral valve leaflets. Mild mitral annular calcification. Mild mitral  valve regurgitation. No evidence of mitral valve stenosis.   Tricuspid Valve: The tricuspid valve is normal in structure. Tricuspid  valve regurgitation is moderate to severe. No evidence of tricuspid  stenosis.   Aortic Valve: The aortic valve is normal in structure.. There is moderate  thickening and moderate calcification of the aortic valve. Aortic valve  regurgitation is not visualized. No aortic stenosis is present. There is  moderate thickening of the aortic  valve. There is moderate calcification of the aortic valve. Aortic valve  mean gradient measures 4.0 mmHg. Aortic valve peak gradient measures 7.2  mmHg. Aortic valve area, by VTI measures 1.20 cm.   Pulmonic Valve: The pulmonic valve was normal in structure. Pulmonic valve  regurgitation is not visualized. No evidence of pulmonic stenosis.   Aorta: The aortic root is normal in size and structure.   Venous: The inferior vena cava is normal in size with greater than 50%  respiratory variability, suggesting right atrial pressure of 3  mmHg.   IAS/Shunts: No atrial level shunt detected by color flow Doppler.     LEFT VENTRICLE  PLAX 2D  LVIDd:     3.80 cm  LVIDs:     2.80 cm  LV PW:     1.20 cm  LV IVS:    1.37 cm  LVOT diam:   1.80 cm  LV SV:     32  LV SV Index:  22  LVOT Area:   2.54 cm     RIGHT VENTRICLE      IVC  RV Basal diam: 3.60 cm  IVC diam: 1.00 cm  RV Mid diam:  2.90 cm  RV S prime:   5.43 cm/s  TAPSE (M-mode): 0.9 cm   LEFT ATRIUM       Index    RIGHT ATRIUM      Index  LA diam:    3.80 cm 2.60 cm/m RA Area:   24.10 cm  LA Vol (A2C):  62.6 ml 42.84 ml/m RA Volume:  67.90 ml 46.46 ml/m  LA Vol (A4C):  50.0 ml 34.22 ml/m  LA Biplane Vol: 57.6 ml 39.42 ml/m  AORTIC VALVE  AV Area (Vmax):  1.43 cm  AV Area (Vmean):  1.22 cm  AV Area (VTI):   1.20 cm  AV Vmax:      134.00 cm/s  AV Vmean:     97.300 cm/s  AV VTI:      0.269 m  AV Peak Grad:   7.2 mmHg  AV Mean Grad:   4.0 mmHg  LVOT Vmax:     75.10 cm/s  LVOT Vmean:    46.500 cm/s  LVOT VTI:     0.127 m  LVOT/AV VTI ratio: 0.47    AORTA  Ao Root diam: 3.10 cm  Ao Asc diam: 3.00 cm   TRICUSPID VALVE  TR Peak grad:  43.6 mmHg  TR Vmax:    330.00 cm/s    SHUNTS  Systemic VTI: 0.13 m  Systemic Diam: 1.80 cm   Antimicrobials:  Anti-infectives (From admission, onward)   Start     Dose/Rate Route Frequency Ordered Stop   04/19/20 1600  Ampicillin-Sulbactam (UNASYN) 3 g in sodium chloride 0.9 % 100 mL IVPB     Discontinue     3 g 200 mL/hr over 30 Minutes  Intravenous Every 12 hours 04/19/20 1520     04/15/20 1630  cefTRIAXone (ROCEPHIN) 1 g in sodium chloride 0.9 % 100 mL IVPB        1 g 200 mL/hr over 30 Minutes Intravenous  Once 04/15/20 1620 04/15/20 1920   04/15/20 1630  azithromycin (ZITHROMAX) 500 mg in sodium chloride 0.9 % 250 mL IVPB        500 mg 250 mL/hr over 60 Minutes Intravenous  Once 04/15/20  1620 04/15/20 2046      Subjective: Seen and examined at bedside and family states that she has been the most talkative that she has been recently.  Patient herself denies any current complaints.  No nausea or vomiting.  Heart rate has been relatively controlled.  She denies any lightheadedness or dizziness.  Continues with supplemental oxygen via nasal cannula.  No other concerns or complaints at this time.  Objective: Vitals:   04/21/20 0749 04/21/20 1000 04/21/20 1100 04/21/20 1322  BP: 128/67  131/82   Pulse: 95 83 (!) 112 (!) 102  Resp: 20 18 18    Temp: 97.6 F (36.4 C)  97.7 F (36.5 C)   TempSrc: Oral  Oral   SpO2: 95% (!) 87% 95% 94%  Weight:      Height:        Intake/Output Summary (Last 24 hours) at 04/21/2020 1332 Last data filed at 04/21/2020 6834 Gross per 24 hour  Intake 773.85 ml  Output 700 ml  Net 73.85 ml   Filed Weights   04/18/20 0413 04/20/20 0354 04/21/20 0443  Weight: 44.3 kg 49.9 kg 48.8 kg   Examination: Physical Exam:  Constitutional: WN/WD thin elderly Caucasian female currently in no acute distress appears calm Eyes: Lids and conjunctivae normal, sclerae anicteric  ENMT: External Ears, Nose appear normal. Grossly normal hearing.  Neck: Appears normal, supple, no cervical masses, normal ROM, no appreciable thyromegaly; no JVD Respiratory: Diminished to auscultation bilaterally with coarse breath sounds and some slight crackles at the bases but no appreciable wheezing, rales, rhonchi.  She continues to wear supplemental oxygen via nasal cannula Cardiovascular: Irregularly irregular with heart rates occasionally in the 100, no murmurs / rubs / gallops. S1 and S2 auscultated.  Continues to have 1+ lower extremity edema Abdomen: Soft, non-tender, non-distended.  GU: Deferred. Musculoskeletal: No clubbing / cyanosis of digits/nails. No joint deformity upper and lower extremities.  Skin: No rashes, lesions, ulcers on limited skin evaluation. No  induration; Warm and dry.  Neurologic: CN 2-12 grossly intact with no focal deficits. Romberg sign and cerebellar reflexes not assessed.  Psychiatric: Normal judgment and insight. Alert and oriented x 3. Normal mood and appropriate affect.   Data Reviewed: I have personally reviewed following labs and imaging studies  CBC: Recent Labs  Lab 04/17/20 0846 04/18/20 0319 04/19/20 0658 04/20/20 0548 04/21/20 0524  WBC 10.0 9.4 17.6* 8.9 7.7  NEUTROABS 8.6* 7.3 15.8* 6.9 5.7  HGB 9.5* 9.5* 9.6* 8.9* 9.5*  HCT 30.5* 29.6* 29.8* 28.9* 30.9*  MCV 83.3 81.8 81.0 82.3 82.4  PLT 295 283 269 225 196   Basic Metabolic Panel: Recent Labs  Lab 04/17/20 0846 04/18/20 0319 04/19/20 0658 04/20/20 0548 04/21/20 0524  NA 139 137 137 138 139  K 3.5 3.6 3.2* 3.9 3.6  CL 103 98 95* 96* 95*  CO2 26 30 31  33* 35*  GLUCOSE 91 101* 107* 94 94  BUN 18 25* 24* 22 19  CREATININE 1.43* 1.45* 1.23* 1.21* 1.13*  CALCIUM 8.1*  8.2* 8.7* 8.5* 8.7*  MG 1.8 1.7 1.7 2.2 1.9  PHOS 3.5 3.7 3.5 3.9 3.2   GFR: Estimated Creatinine Clearance: 25.2 mL/min (A) (by C-G formula based on SCr of 1.13 mg/dL (H)). Liver Function Tests: Recent Labs  Lab 04/17/20 0846 04/18/20 0319 04/19/20 0658 04/20/20 0548 04/21/20 0524  AST 18 19 24 26  35  ALT 15 15 16 17 24   ALKPHOS 57 52 52 52 47  BILITOT 0.5 0.7 0.8 0.7 0.7  PROT 6.3* 6.3* 6.3* 6.2* 6.7  ALBUMIN 2.3* 2.2* 2.3* 2.1* 2.2*   No results for input(s): LIPASE, AMYLASE in the last 168 hours. No results for input(s): AMMONIA in the last 168 hours. Coagulation Profile: Recent Labs  Lab 04/15/20 1629  INR 1.5*   Cardiac Enzymes: No results for input(s): CKTOTAL, CKMB, CKMBINDEX, TROPONINI in the last 168 hours. BNP (last 3 results) No results for input(s): PROBNP in the last 8760 hours. HbA1C: No results for input(s): HGBA1C in the last 72 hours. CBG: No results for input(s): GLUCAP in the last 168 hours. Lipid Profile: No results for input(s): CHOL,  HDL, LDLCALC, TRIG, CHOLHDL, LDLDIRECT in the last 72 hours. Thyroid Function Tests: No results for input(s): TSH, T4TOTAL, FREET4, T3FREE, THYROIDAB in the last 72 hours. Anemia Panel: No results for input(s): VITAMINB12, FOLATE, FERRITIN, TIBC, IRON, RETICCTPCT in the last 72 hours. Sepsis Labs: Recent Labs  Lab 04/15/20 2044 04/15/20 2055 04/16/20 0330 04/17/20 0846  PROCALCITON 1.23  --  1.06 0.51  LATICACIDVEN 1.2 0.7  --   --     Recent Results (from the past 240 hour(s))  Blood culture (routine x 2)     Status: None   Collection Time: 04/15/20  4:25 PM   Specimen: BLOOD RIGHT HAND  Result Value Ref Range Status   Specimen Description BLOOD RIGHT HAND  Final   Special Requests   Final    BOTTLES DRAWN AEROBIC AND ANAEROBIC Blood Culture results may not be optimal due to an inadequate volume of blood received in culture bottles   Culture   Final    NO GROWTH 5 DAYS Performed at [facility], [place] [address] [place], [place] [address]    Report Status 04/20/2020 FINAL  Final  Blood culture (routine x 2)     Status: None   Collection Time: 04/15/20  4:40 PM   Specimen: BLOOD RIGHT ARM  Result Value Ref Range Status   Specimen Description BLOOD RIGHT ARM  Final   Special Requests   Final    BOTTLES DRAWN AEROBIC AND ANAEROBIC Blood Culture results may not be optimal due to an excessive volume of blood received in culture bottles   Culture   Final    NO GROWTH 5 DAYS Performed at [facility], [place] [address] [place], [place] [address]    Report Status 04/20/2020 FINAL  Final  SARS Coronavirus 2 by RT PCR (hospital order, performed in [facility] hospital lab) Nasopharyngeal Nasopharyngeal Swab     Status: None   Collection Time: 04/15/20  9:52 PM   Specimen: Nasopharyngeal Swab  Result Value Ref Range Status   SARS Coronavirus 2 NEGATIVE NEGATIVE Final    Comment: (NOTE) SARS-CoV-2 target nucleic acids are NOT DETECTED.  The SARS-CoV-2 RNA is generally  detectable in upper and lower respiratory specimens during the acute phase of infection. The lowest concentration of SARS-CoV-2 viral copies this assay can detect is 250 copies / mL. A negative result does not preclude  SARS-CoV-2 infection and should not be used as the sole basis for treatment or other patient management decisions.  A negative result may occur with improper specimen collection / handling, submission of specimen other than nasopharyngeal swab, presence of viral mutation(s) within the areas targeted by this assay, and inadequate number of viral copies (<250 copies / mL). A negative result must be combined with clinical observations, patient history, and epidemiological information.  Fact Sheet for Patients:   StrictlyIdeas.no  Fact Sheet for Healthcare Providers: BankingDealers.co.za  This test is not yet approved or  cleared by the [place] FDA and has been authorized for detection and/or diagnosis of SARS-CoV-2 by FDA under an Emergency Use Authorization (EUA).  This EUA will remain in effect (meaning this test can be used) for the duration of the COVID-19 declaration under Section 564(b)(1) of the Act, 21 U.S.C. section 360bbb-3(b)(1), unless the authorization is terminated or revoked sooner.  Performed at [facility], [place] [address] [place], [place] [address]   Culture, blood (routine x 2)     Status: None (Preliminary result)   Collection Time: 04/19/20  9:54 AM   Specimen: BLOOD RIGHT HAND  Result Value Ref Range Status   Specimen Description BLOOD RIGHT HAND  Final   Special Requests   Final    BOTTLES DRAWN AEROBIC ONLY Blood Culture adequate volume   Culture   Final    NO GROWTH 1 DAY Performed at [facility], [place] [address] [place], [place] [address]    Report Status PENDING  Incomplete  Culture, blood (routine x 2)     Status: None (Preliminary result)   Collection Time: 04/19/20   9:58 AM   Specimen: BLOOD LEFT HAND  Result Value Ref Range Status   Specimen Description BLOOD LEFT HAND  Final   Special Requests   Final    BOTTLES DRAWN AEROBIC ONLY Blood Culture adequate volume   Culture   Final    NO GROWTH 1 DAY Performed at [facility], [place] [address], [place], [place] [address]    Report Status PENDING  Incomplete  Culture, Urine     Status: None   Collection Time: 04/19/20  1:29 PM   Specimen: Urine, Random  Result Value Ref Range Status   Specimen Description URINE, RANDOM  Final   Special Requests NONE  Final   Culture   Final    NO GROWTH Performed at [facility], [place] [address], [place], [place] [address]    Report Status 04/20/2020 FINAL  Final     RN Pressure Injury Documentation: Pressure Injury 04/17/20 Buttocks Left Stage 2 -  Partial thickness loss of dermis presenting as a shallow open injury with a red, pink wound bed without slough. (Active)  04/17/20 0132  Location: Buttocks  Location Orientation: Left  Staging: Stage 2 -  Partial thickness loss of dermis presenting as a shallow open injury with a red, pink wound bed without slough.  Wound Description (Comments):   Present on Admission:      Estimated body mass index is 21.01 kg/m as calculated from the following:   Height as of this encounter: 5' (1.524 m).   Weight as of this encounter: 48.8 kg.  Malnutrition Type:  Nutrition Problem: Severe Malnutrition Etiology: chronic illness (CHF)   Malnutrition Characteristics:  Signs/Symptoms: severe muscle depletion, severe fat depletion   Nutrition Interventions:  Interventions: Ensure Enlive (each supplement provides 350kcal and 20 grams of protein), MVI, Magic  cup Radiology Studies: DG CHEST PORT 1 VIEW  Result Date: 04/21/2020 CLINICAL DATA:  Shortness of breath EXAM: PORTABLE CHEST 1 VIEW COMPARISON:  04/20/2020 FINDINGS: Increased interstitial prominence and patchy density at the left lung base is without  substantial change. No significant pleural effusion. No pneumothorax. Stable cardiomediastinal contours. IMPRESSION: No substantial change since 04/20/2020 with persistent increased interstitial prominence and patchy density at the left lung base. Electronically Signed   By: Macy Mis M.D.   On: 04/21/2020 08:20   DG CHEST PORT 1 VIEW  Result Date: 04/20/2020 CLINICAL DATA:  Evaluate Stable cardiomegaly. Stable interstitial densities are noted throughout both lungs which may represent edema or possibly atypical inflammation EXAM: PORTABLE CHEST 1 VIEW COMPARISON:  Chest radiograph 04/19/2020 FINDINGS: Stable cardiomediastinal contours. Aortic atherosclerotic calcification. Persistent diffuse bilateral interstitial opacities. Small bandlike opacity at the left base likely atelectasis. No pneumothorax or large pleural effusion. No acute finding in the visualized skeleton. Old right rib fracture. IMPRESSION: Unchanged diffuse bilateral interstitial opacities. Probable mild left basilar atelectasis. Electronically Signed   By: Audie Pinto M.D.   On: 04/20/2020 15:43   Scheduled Meds: . apixaban  2.5 mg Oral BID  . atorvastatin  20 mg Oral Daily  . brimonidine  1 drop Right Eye BID  . diltiazem  30 mg Oral Q6H  . feeding supplement (ENSURE ENLIVE)  237 mL Oral BID BM  . furosemide  40 mg Intravenous Q12H  . latanoprost  1 drop Both Eyes Q1200  . mouth rinse  15 mL Mouth Rinse BID  . metoprolol succinate  75 mg Oral Daily  . multivitamin with minerals  1 tablet Oral Daily  . Netarsudil Dimesylate  1 drop Right Eye Daily  . sodium chloride flush  3 mL Intravenous Q12H   Continuous Infusions: . sodium chloride 250 mL (04/21/20 0439)  . ampicillin-sulbactam (UNASYN) IV 3 g (04/21/20 0441)    LOS: 6 days   Kerney Elbe, DO [facility] PAGER is on AMION  If 7PM-7AM, please contact night-coverage [email or web address]

## 2020-04-21 NOTE — Progress Notes (Signed)
Physical Therapy Treatment Patient Details Name: Kelli Peterson MRN: [id number] DOB: [birth year] Today's Date: 04/21/2020    History of Present Illness Pt is an 86 y/o female admitted secondary to palpitations and SOB x3 weeks. Pt found to have acute respiratory failure with hypoxemia and a-fib with RVR. PMH including but not limited to HTN, CHF, a-fib and cervical cancer.    PT Comments    Pt dozing in bed, easily wakes. Pt refuses OOB as she has not slept well last night. Encouraged movement but pt only agreeable to bed level exercise. D/c plans remain appropriate. PT will continue to follow acutely.     Follow Up Recommendations  SNF;Other (comment) (if pt/family refuses, will need 24/7 assistance and HHPT)     Equipment Recommendations  None recommended by PT       Precautions / Restrictions Precautions Precautions: Fall Precaution Comments: monitor SpO2 Restrictions Weight Bearing Restrictions: No    Mobility  Bed Mobility               General bed mobility comments: pt refused OOB           Cognition Arousal/Alertness: Awake/alert Behavior During Therapy: WFL for tasks assessed/performed Overall Cognitive Status: Impaired/Different from baseline Area of Impairment: Following commands;Problem solving                       Following Commands: Follows multi-step commands with increased time     Problem Solving: Slow processing;Requires verbal cues General Comments: increased time and processing required for exercise completion       Exercises General Exercises - Upper Extremity Shoulder Flexion: AROM;Both;5 reps;Supine Elbow Flexion: AROM;Both;5 reps;Supine Elbow Extension: AROM;Both;10 reps;Supine Low Level/ICU Exercises Ankle Circles/Pumps: AROM;Both;10 reps;Supine Quad Sets: AROM;Both;5 reps;Supine Heel Slides: AROM;Both;5 reps;Supine Breathing Exercises: AROM;5 reps;Supine Other Exercises Other Exercises: 5x bilateral shoulder rolls      General Comments General comments (skin integrity, edema, etc.): HR during exercise 80-138bpm, SaO2 on 3L O2 >92%O2      Pertinent Vitals/Pain Faces Pain Scale: No hurt           PT Goals (current goals can now be found in the care plan section) Acute Rehab PT Goals Patient Stated Goal: to rest PT Goal Formulation: With patient Time For Goal Achievement: 05/01/20 Potential to Achieve Goals: Fair Progress towards PT goals: Not progressing toward goals - comment (had to be encouraged to participate in exercise )    Frequency    Min 3X/week      PT Plan Current plan remains appropriate       AM-PAC PT "6 Clicks" Mobility   Outcome Measure  Help needed turning from your back to your side while in a flat bed without using bedrails?: None Help needed moving from lying on your back to sitting on the side of a flat bed without using bedrails?: None Help needed moving to and from a bed to a chair (including a wheelchair)?: A Little Help needed standing up from a chair using your arms (e.g., wheelchair or bedside chair)?: A Little Help needed to walk in hospital room?: A Little Help needed climbing 3-5 steps with a railing? : A Lot 6 Click Score: 19    End of Session Equipment Utilized During Treatment: Oxygen Activity Tolerance: Patient limited by fatigue Patient left: in bed;with call bell/phone within reach;with bed alarm set Nurse Communication: Mobility status PT Visit Diagnosis: Other abnormalities of gait and mobility (R26.89);Muscle weakness (generalized) (M62.81)  Time: 2924-4628 PT Time Calculation (min) (ACUTE ONLY): 10 min  Charges:  $Therapeutic Exercise: 8-22 mins                     Marvelous Bouwens B. Migdalia Dk PT, DPT Acute Rehabilitation Services Pager [phone] Office [phone]    [place] 04/21/2020, 4:39 PM

## 2020-04-21 NOTE — Care Management Important Message (Signed)
Important Message  Patient Details  Name: Kelli Peterson MRN: [id number] Date of Birth: January 24, 1930   Medicare Important Message Given:  Yes     Shelda Altes 04/21/2020, 12:00 PM

## 2020-04-22 ENCOUNTER — Inpatient Hospital Stay (HOSPITAL_COMMUNITY): Payer: Medicare Other

## 2020-04-22 DIAGNOSIS — J9601 Acute respiratory failure with hypoxia: Secondary | ICD-10-CM | POA: Diagnosis not present

## 2020-04-22 LAB — CBC WITH DIFFERENTIAL/PLATELET
Abs Immature Granulocytes: 0.04 10*3/uL (ref 0.00–0.07)
Basophils Absolute: 0 10*3/uL (ref 0.0–0.1)
Basophils Relative: 1 %
Eosinophils Absolute: 0.3 10*3/uL (ref 0.0–0.5)
Eosinophils Relative: 5 %
HCT: 31.8 % — ABNORMAL LOW (ref 36.0–46.0)
Hemoglobin: 10 g/dL — ABNORMAL LOW (ref 12.0–15.0)
Immature Granulocytes: 1 %
Lymphocytes Relative: 11 %
Lymphs Abs: 0.7 10*3/uL (ref 0.7–4.0)
MCH: 25.9 pg — ABNORMAL LOW (ref 26.0–34.0)
MCHC: 31.4 g/dL (ref 30.0–36.0)
MCV: 82.4 fL (ref 80.0–100.0)
Monocytes Absolute: 0.9 10*3/uL (ref 0.1–1.0)
Monocytes Relative: 14 %
Neutro Abs: 4.6 10*3/uL (ref 1.7–7.7)
Neutrophils Relative %: 68 %
Platelets: 257 10*3/uL (ref 150–400)
RBC: 3.86 MIL/uL — ABNORMAL LOW (ref 3.87–5.11)
RDW: 18.3 % — ABNORMAL HIGH (ref 11.5–15.5)
WBC: 6.6 10*3/uL (ref 4.0–10.5)
nRBC: 0 % (ref 0.0–0.2)

## 2020-04-22 LAB — MAGNESIUM: Magnesium: 2 mg/dL (ref 1.7–2.4)

## 2020-04-22 LAB — COMPREHENSIVE METABOLIC PANEL
ALT: 27 U/L (ref 0–44)
AST: 39 U/L (ref 15–41)
Albumin: 2.3 g/dL — ABNORMAL LOW (ref 3.5–5.0)
Alkaline Phosphatase: 47 U/L (ref 38–126)
Anion gap: 9 (ref 5–15)
BUN: 18 mg/dL (ref 8–23)
CO2: 38 mmol/L — ABNORMAL HIGH (ref 22–32)
Calcium: 8.7 mg/dL — ABNORMAL LOW (ref 8.9–10.3)
Chloride: 92 mmol/L — ABNORMAL LOW (ref 98–111)
Creatinine, Ser: 1.12 mg/dL — ABNORMAL HIGH (ref 0.44–1.00)
GFR calc Af Amer: 51 mL/min — ABNORMAL LOW (ref >60)
GFR calc non Af Amer: 44 mL/min — ABNORMAL LOW (ref >60)
Glucose, Bld: 98 mg/dL (ref 70–99)
Potassium: 3.3 mmol/L — ABNORMAL LOW (ref 3.5–5.1)
Sodium: 139 mmol/L (ref 135–145)
Total Bilirubin: 0.8 mg/dL (ref 0.3–1.2)
Total Protein: 6.8 g/dL (ref 6.5–8.1)

## 2020-04-22 LAB — PHOSPHORUS: Phosphorus: 3.9 mg/dL (ref 2.5–4.6)

## 2020-04-22 LAB — OCCULT BLOOD X 1 CARD TO LAB, STOOL: Fecal Occult Bld: NEGATIVE

## 2020-04-22 MED ORDER — DILTIAZEM HCL 60 MG PO TABS
60.0000 mg | ORAL_TABLET | Freq: Four times a day (QID) | ORAL | Status: AC
  Administered 2020-04-22 – 2020-04-24 (×6): 60 mg via ORAL
  Filled 2020-04-22 (×6): qty 1

## 2020-04-22 MED ORDER — POTASSIUM CHLORIDE CRYS ER 20 MEQ PO TBCR
40.0000 meq | EXTENDED_RELEASE_TABLET | Freq: Two times a day (BID) | ORAL | Status: AC
  Administered 2020-04-22 (×2): 40 meq via ORAL
  Filled 2020-04-22 (×2): qty 2

## 2020-04-22 MED ORDER — DILTIAZEM HCL 60 MG PO TABS
30.0000 mg | ORAL_TABLET | Freq: Once | ORAL | Status: AC
  Administered 2020-04-22: 30 mg via ORAL
  Filled 2020-04-22: qty 1

## 2020-04-22 NOTE — Progress Notes (Signed)
Occupational Therapy Treatment Patient Details Name: KRISANN MCKENNA MRN: [id number] DOB: [birth year] Today's Date: 04/22/2020    History of present illness Pt is an 86 y/o female admitted secondary to palpitations and SOB x3 weeks. Pt found to have acute respiratory failure with hypoxemia and a-fib with RVR. PMH including but not limited to HTN, CHF, a-fib and cervical cancer.   OT comments  Patient continues to make steady progress towards goals in skilled OT session. Patient's session encompassed ADLs and functional transfers in order to increase overall activity tolerance and endurance. Pt motivated to get up OOB in session, however stated "I do not want to walk". Provided education and encouragement to pt, however could not be convinced to ambulate. Pt oriented x4, however demonstrated deficits in cognition with regard to sequencing of transfers (stand pivot to chair) as well as motor planning to sit EOB. Pt noted to have kyphotic posture throughout session, requiring increased multimodal cues to promote upright posture, but would fatigue quickly. Discharge currently remains appropriate; will continue to follow acutely.    Follow Up Recommendations  SNF    Equipment Recommendations  None recommended by OT    Recommendations for Other Services      Precautions / Restrictions Precautions Precautions: Fall Precaution Comments: monitor SpO2 watch HR (intermittent tachy in session) Restrictions Weight Bearing Restrictions: No       Mobility Bed Mobility Overal bed mobility: Needs Assistance Bed Mobility: Supine to Sit     Supine to sit: Min assist        Transfers Overall transfer level: Needs assistance Equipment used: 1 person hand held assist;Rolling walker (2 wheeled) Transfers: Sit to/from Stand Sit to Stand: Min assist         General transfer comment: excessive anterior weight shift and forward lean to power up into standing from EOB; min A for stability     Balance Overall balance assessment: Needs assistance Sitting-balance support: Feet supported Sitting balance-Leahy Scale: Fair     Standing balance support: Single extremity supported;Bilateral upper extremity supported Standing balance-Leahy Scale: Poor                             ADL either performed or assessed with clinical judgement   ADL Overall ADL's : Needs assistance/impaired                     Lower Body Dressing: Maximal assistance;Sitting/lateral leans;Sit to/from stand Lower Body Dressing Details (indicate cue type and reason): Kyphotic in posture, requring cues to remain upright, unable to reach socks without assist Toilet Transfer: Stand-pivot;Minimal assistance;RW;Cueing for sequencing;Cueing for safety Toilet Transfer Details (indicate cue type and reason): Required increased cues for sequencing, sitting preemptively in the recliner         Functional mobility during ADLs: Minimal assistance;Moderate assistance;Rolling walker General ADL Comments: Pt remains limited by fatigue, but motivated to sit in the chair, encouraged to walk with PT later this date     Vision       Perception     Praxis      Cognition Arousal/Alertness: Awake/alert Behavior During Therapy: WFL for tasks assessed/performed Overall Cognitive Status: Impaired/Different from baseline Area of Impairment: Following commands;Problem solving;Safety/judgement;Awareness                       Following Commands: Follows one step commands with increased time;Follows multi-step commands with increased time Safety/Judgement: Decreased awareness of deficits;Decreased awareness  of safety Awareness: Emergent Problem Solving: Slow processing;Requires verbal cues;Difficulty sequencing;Requires tactile cues General Comments: When transferring, required increased multi-modal cues to sequence steps, however sat prematurely...able to respond to cues to promote upright  posture, however resumed increased kyphotic position upon transferring        Exercises     Shoulder Instructions       General Comments      Pertinent Vitals/ Pain       Pain Assessment: No/denies pain Faces Pain Scale: No hurt  Home Living                                          Prior Functioning/Environment              Frequency  Min 2X/week        Progress Toward Goals  OT Goals(current goals can now be found in the care plan section)  Progress towards OT goals: Progressing toward goals  Acute Rehab OT Goals Patient Stated Goal: To sit up OT Goal Formulation: With patient Time For Goal Achievement: 05/02/20 Potential to Achieve Goals: Good  Plan Discharge plan remains appropriate    Co-evaluation                 AM-PAC OT "6 Clicks" Daily Activity     Outcome Measure   Help from another person eating meals?: A Little Help from another person taking care of personal grooming?: A Little Help from another person toileting, which includes using toliet, bedpan, or urinal?: A Lot Help from another person bathing (including washing, rinsing, drying)?: A Lot Help from another person to put on and taking off regular upper body clothing?: A Little Help from another person to put on and taking off regular lower body clothing?: A Lot 6 Click Score: 15    End of Session Equipment Utilized During Treatment: Gait belt;Rolling walker  OT Visit Diagnosis: Unsteadiness on feet (R26.81);Other abnormalities of gait and mobility (R26.89);Muscle weakness (generalized) (M62.81)   Activity Tolerance Patient limited by fatigue   Patient Left in chair;with call bell/phone within reach;with family/visitor present   Nurse Communication Mobility status (Increased HR at EOB and transferring)        Time: 5681-2751 OT Time Calculation (min): 24 min  Charges: OT General Charges $OT Visit: 1 Visit OT Treatments $Self Care/Home Management :  23-37 mins  [place]. Khadijah Mastrianni, COTA/L Acute Rehabilitation Services [place] 04/22/2020, 11:02 AM

## 2020-04-22 NOTE — Progress Notes (Signed)
Physical Therapy Treatment Patient Details Name: Kelli Peterson MRN: [id number] DOB: Feb 28, 1930 Today's Date: 04/22/2020    History of Present Illness Pt is an 86 y/o female admitted secondary to palpitations and SOB x3 weeks. Pt found to have acute respiratory failure with hypoxemia and a-fib with RVR. PMH including but not limited to HTN, CHF, a-fib and cervical cancer.    PT Comments    Pt in process of transferring back to bed from recliner with NT on entry. With maximal encouragement from daughter and therapist, pt agreeable to sitting EoB for eating lunch before returning to supine. Pt able to maintain seated balance without assist and minor tactile cues for postural change for approximately 10 minutes. Provided posterior support for 2 minutes as she ate her dessert. Pt requires min A for return to supine. D/c plans remain appropriate at this time. PT will continue to follow acutely.     Follow Up Recommendations  SNF;Other (comment) (if pt/family refuses, will need 24/7 assistance and HHPT)     Equipment Recommendations  None recommended by PT       Precautions / Restrictions Precautions Precautions: Fall Precaution Comments: monitor SpO2 watch HR (intermittent tachy in session) Restrictions Weight Bearing Restrictions: No    Mobility  Bed Mobility Overal bed mobility: Needs Assistance Bed Mobility: Supine to Sit     Supine to sit: Min assist     General bed mobility comments: min A for management of LE back into bed, pt able to bridge and push her self up in bed with PT assist  Transfers Overall transfer level: Needs assistance Equipment used: 1 person hand held assist;Rolling walker (2 wheeled) Transfers: Stand Pivot Transfers Sit to Stand: Min assist         General transfer comment: NT in process of pivot transfer to bed on entry      Balance Overall balance assessment: Needs assistance Sitting-balance support: Feet supported;Single extremity  supported;Bilateral upper extremity supported;No upper extremity supported Sitting balance-Leahy Scale: Fair Sitting balance - Comments: pt sat EoB to eat lunch before returning to supine in bed   Standing balance support: Single extremity supported;Bilateral upper extremity supported Standing balance-Leahy Scale: Poor                              Cognition Arousal/Alertness: Awake/alert Behavior During Therapy: WFL for tasks assessed/performed Overall Cognitive Status: Impaired/Different from baseline Area of Impairment: Following commands;Problem solving;Safety/judgement;Awareness                       Following Commands: Follows one step commands with increased time;Follows multi-step commands with increased time Safety/Judgement: Decreased awareness of deficits;Decreased awareness of safety Awareness: Emergent Problem Solving: Slow processing;Requires verbal cues;Difficulty sequencing;Requires tactile cues General Comments: requires increased cuing, somewhat self limiting,       Exercises Other Exercises Other Exercises: sat EoB 15 minutes for eating lunch     General Comments General comments (skin integrity, edema, etc.): Daughter present in room, SaO2 >90%O2 on 3L O2 HR max 124 bpm with transfer chair to bed      Pertinent Vitals/Pain Pain Assessment: Faces Faces Pain Scale: No hurt           PT Goals (current goals can now be found in the care plan section) Acute Rehab PT Goals Patient Stated Goal: To sit up PT Goal Formulation: With patient Time For Goal Achievement: 05/01/20 Potential to Achieve Goals: Fair  Frequency    Min 3X/week      PT Plan Current plan remains appropriate       AM-PAC PT "6 Clicks" Mobility   Outcome Measure  Help needed turning from your back to your side while in a flat bed without using bedrails?: None Help needed moving from lying on your back to sitting on the side of a flat bed without using  bedrails?: None Help needed moving to and from a bed to a chair (including a wheelchair)?: A Little Help needed standing up from a chair using your arms (e.g., wheelchair or bedside chair)?: A Little Help needed to walk in hospital room?: A Little Help needed climbing 3-5 steps with a railing? : A Lot 6 Click Score: 19    End of Session Equipment Utilized During Treatment: Oxygen Activity Tolerance: Patient limited by fatigue Patient left: in bed;with call bell/phone within reach;with bed alarm set Nurse Communication: Mobility status PT Visit Diagnosis: Other abnormalities of gait and mobility (R26.89);Muscle weakness (generalized) (M62.81)     Time: 1607-3710 PT Time Calculation (min) (ACUTE ONLY): 22 min  Charges:  $Therapeutic Activity: 8-22 mins                     Magaline Steinberg B. Migdalia Dk PT, DPT Acute Rehabilitation Services Pager [phone] Office [phone]    [place] 04/22/2020, 2:29 PM

## 2020-04-22 NOTE — Progress Notes (Signed)
PROGRESS NOTE    Kelli Peterson  UKG:[id number] DOB: 09-Aug-1930 DOA: 04/15/2020 PCP: Jani Gravel, MD  Brief Narrative:  HPI per Dr. Early Osmond on 04/15/20 Kelli Peterson is a 86 y.o. female with medical history significant of A. fib-on Eliquis, hypertension, hyperlipidemia, chronic diastolic CHF presents to emergency department with palpitation and shortness of breath since 3 weeks.  Patient's daughter at the bedside is the story and.  She tells me that patient has palpitation and shortness of breath since 3 weeks.  She was seen by her PCP and diltiazem dose was increased and and on follow-up appointment digoxin was added yesterday however due to persistent symptoms patient came to ER for further evaluation and management.  Patient's daughter reports leg swelling, weight gain, shortness of breath and decreased oxygen saturation in 70s and 80s.  Patient is not on home oxygen.  Patient cannot lie flat due to dizziness, she sleeps on a recliner.  She has chronic cough however denies wheezing, change in sputum color, fever, chills, nausea, vomiting, diarrhea, headache, blurry vision, chest pain, urinary or bowel changes.  She lives with her daughter at home.  Uses walker for ambulation.  No history of smoking, alcohol, street drug use.    ED Course: Upon arrival to ED: Patient's heart rate noted to be in the 130s, tachypneic, requiring 3 L of oxygen via nasal cannula.,  Afebrile with no leukocytosis, BNP: 528, digoxin level: 0.7, lactic acid, COVID-19, blood culture: Pending.  Chest x-ray shows interstitial prominence throughout both lungs, increased as compared to the prior exam.  Findings are nonspecific but may reflect interstitial edema or atypical/viral pneumonia.  Unchanged cardiomegaly.  Patient received metoprolol 5 mg, Lasix 20 mg and Rocephin and azithromycin in ED.  EDP consulted cardiology.  [facility] hospitalist consulted for admission for A. fib with RVR and new onset acute hypoxemic  respiratory failure.  **Interim History  She continues to remain slightly dyspneic and has been wearing oxygen.  Family states that they have noticed worsening swelling.  Has never seen a cardiologist but has had PCP try and manage.  Echocardiogram has been done and shows a left ventricular ejection fraction of 50 to 55% with the left ventricle having a low normal function there is mild concentric left ventricular hypertrophy.  Left ventricular diastolic parameters were indeterminate and she has a right ventricular size function that is mildly reduced with severely dilated left atrial size as well as right atrial size and tricuspid regurgitation is moderate to severe.  Cardiology evaluated and feels that they will not be able to convert her to normal sinus rhythm and maintain the rhythm so they can recommend rate control given her severe bilateral atrial enlargement.  Cardiology has adjusted her medications and they recommend continuing the Cardizem drip and restarting Toprol-XL 25 mg p.o. daily and titrate as needed for heart rate control.  Cardiology also recommends continue IV diuresis with Lasix for now.  She continues to be volume overloaded so they are recommending continue IV diuresis and because her heart rates still in the 120s the diltiazem drip is going to be adjusted and she will be increased on her Toprol-XL to 50 mg p.o. daily.  PT OT recommending SNF when she is stable for discharge  04/19/2020 Patient's heart rate is improved today but her white blood cell count went up significantly and patient has been noted to be coughing when eating meals and drinking and there is concern for aspiration so an SLP evaluation was done.  Panculture was also initiated given her elevated WBC.  We will obtain a chest x-ray, urine culture and urinalysis as well as blood cultures.  We will hold off on empiric antibiotics at this time and continue to monitor her WBC.  Speech therapy evaluated and recommending a  dysphagia 2 diet with nectar thick liquids.  Cardiology further increased Toprol-XL to 75 mg p.o. daily and continue to recommending diuresis  04/20/20 Heart rate was better controlled today and she has been weaned off the Cardizem drip to p.o. Cardizem by cardiology. Cardiology still feel that she needs IV diuresis so we will continue this. Cardiology in the process of changing her from amiodarone drip to 30 mg of diltiazem every 6h. In the interim her leukocytosis has improved and will continue IV Unasyn for concern for aspiration.  04/21/20 Heart rate continues to be relatively well controlled but still remains volume overloaded so she is continuing to be diuresed by cardiology.  Daughter states that she is much more talkative today.  No complaints of the patient.  04/22/20 PT OT still recommending SNF and she has insurance approval at bed and [employer].  Heart rate remains intermittently tachycardic.  Cardiology is considering obtaining a high-res CT scan given that she is atelectasis versus consolidation of the left lung base will interstitial opacities are not clear and given her persistent tachycardia and O2 requirements despite being adequately diuresed.  For now we'll continue IV antibiotics with a stop date of 5 days and cardiology is also titrated up her diltiazem to 60 mg every 6 and will be continuing diuresis with IV 40 twice daily.  SLP evaluating and going to do MBS.  Assessment & Plan:   Principal Problem:   Acute hypoxemic respiratory failure (HCC) Active Problems:   Atrial fibrillation with RVR (HCC)   Essential hypertension   CKD (chronic kidney disease), stage III   Pressure injury of skin   Acute on chronic diastolic heart failure (HCC)  Acute Respiratory Failure with Hypoxia Secondary to acute on chronic diastolic CHF/ less likely pneumonia?,  But patient may have aspirated -Patient is requiring 3 L of oxygen via nasal cannula and will attempt to wean when she becomes less  volume overloaded.   -She continues leg swelling and weight gain and some orthopnea.  -Reviewed chest x-ray.  BNP: 528.  COVID-19 Negative . -Patient received Lasix 20 mg, Rocephin and azithromycin in ED. -Admit patient at stepdown unit for close monitoring.  -SpO2: 98 % O2 Flow Rate (L/min): 2 L/min -On continuous pulse ox.  We will try to wean off of oxygen as tolerated. -Start on Lasix 40 IV twice every 12 hours and will continue again today per cardiology recommendations.  Strict INO's and daily weight. -Patient is -3.828 L since admission and weight is has been intermittently elevated and question its accuracy as she went from 107 pounds 110 pounds -Continue to fluid Restrict to 1500 mL -No transthoracic echo in the system but have ordered one and has been done; echo showed a left ventricular ejection fraction 50 to 55% with left ventricular having a low normal function and left ventricular has no regional wall more action abnormalities.  There is mild concentric left ventricular hypertrophy and left ventricular diastolic parameters were indeterminate and patient also had right ventricular systolic function is mildly reduced -Check TSH and was 1.144.  Check procalcitonin level and was 1.23 and trended down to 0.51 -She did spike a leukocytosis of 17.6 so antibiotics were resumed as there is  concern for aspirating -CBC is trending down -Chest x-ray today showed "Increased interstitial prominence and patchy density at the left lung base is without substantial change. No significant pleural effusion. No pneumothorax. Stable cardiomediastinal contours."  -We will empirically start the patient on IV Unasyn given her concern for aspiration and will continue for 5 days; today is day 4 out of 5; blood cultures initially done showed no growth at 5 days still and repeat blood cultures on 04/19/2020 showed no growth today at 2 days -Urinalysis was unremarkable and Urine Cx showed no growth -If WBC persist  may consider obtaining a CT scan of the chest; cardiology is currently considering high rates CT scan to rule out ILD -Monitor electrolytes and kidney function closely. -SLP evaluated and recommending dysphagia 2 diet with nectar thick liquids; Will Fluid Restrict to 1500 mL as above; she will be going for MBS -DuoNebs as needed but may need to change to Xopenex/Atrovent given her A. fib -Continue to monitor for signs and symptoms of volume overload -We will need an ambulatory O2 screen prior to discharge as well as a PT OT evaluation.  The PT OT evaluation is recommending skilled nursing facility and she [facility] as well as insurance authorization -Continue diuresis per cardiology and they have also changed some medications around and increased her metoprolol a few days ago -Cardiology feels that her diastolic CHF is likely exacerbated by her elevated heart rate and they feel that she needs good heart rate control and strict low-sodium diet -See below for further treatment and follow-up on cardiology recommendations  Persistent A. Fib with RVR -Received metoprolol 5 mg once in ED. -Recently has been seeing her PCP who have been adjusting medication and she has had prior to admission medications with metoprolol XL 25 mg p.o. daily, diltiazem 180 mg p.o. daily, hydroxyzine 0.125 mg daily and she is currently anticoagulated with low-dose Eliquis -In the ED she was given IV metoprolol and IV diltiazem. -Continue with anticoagulation with low-dose Eliquis -Cardiology recommending holding hydralazine and amlodipine to allow for greater blood pressure and continue diltiazem for now and titrate stop transition to p.o. -CHA2DS2-VASc is at least a 5 for Age, Sex, HTN, and CHF Hx -Monitor heart rate closely as she intermittently has tachycardic rates cardiology transitioned over to 30 mg of p.o. diltiazem every 6 hours scheduled and will increase this to 60 mg p.o. every 6  scheduled by cardiology recommendations -Cardiology also recommend restarting Toprol-XL 25 mg p.o. daily and now further titrated up to 75 mg p.o. daily -Appreciate further care per cardiology and they stopped the digoxin due to advanced age and stop amlodipine as she is already on a calcium channel blocker with Cardizem; they feel will be very difficult to convert to normal sinus rhythm and maintain rhythm given her severe bilateral atrial enlargement so they would recommend continue rate control -Cardiology still working on controlling her heart rate and she is currently not stable for discharge as she is also still remains somewhat volume overloaded and they may want to consider evaluating her with a high-res CT scan  Acquired Thrombophilia -CHA2DS2-VASc is at least a 5 for Age, Sex, HTN, and CHF Hx -Continue with low-dose Eliquis 2.5 mg p.o. twice daily given that her age is above 97 and that her weight is below 60 kg  Hypertension -On the softer side but now improved -Continue diltiazem drip -Cardiology recommending holding hydralazine and amlodipine to allow for greater blood pressure -Monitor  blood pressure closely as last blood pressure was 131/82  Hyperlipidemia -Continue Atorvastatin 20 mg po Daily   Pulmonary Hypertension  -Moderate and likely group 2 related to pulmonary versus hypotension from CHF -She has moderate to severe tricuspid regurg and a right ventricular function is mildly reduced -Cardiology recommending continue IV diuretics when she is getting IV 40 twice daily as above  Metabolic Acidosis, improved -The patient's CO2 initially was 21, anion gap was 14, chloride level was 104; her CO2 is now 35, chloride level is 94, anion gap is 9 -We will continue to monitor and trend and repeat CMP in a.m.  Normocytic Anemia -H&H: 10.1/32.5 on admission was 13.2/41.28 months ago; repeat showed a hemoglobin/hematocrit of 10.0/31.8 -No active bleeding.  Monitor H&H closely.   Transfuse as needed. -Check anemia panel and it showed an iron level of 20, U IBC 239, TIBC 259, saturation ratios of 8%, ferritin level of 62, folate of 26.5, vitamin B12 470 -Continue to monitor for signs and symptoms of bleeding; currently no overt bleeding with  -Repeat CBC in a.m.  AKI on CKD stage IIIb, stable and improving -Creatinine: 1.36, GFR: 35 (previous creatinine: 1.89, GFR: 58) -BUN/creatinine is now improved and trended down and is 18/1.2 -Continue to avoid nephrotoxic medications, contrast dyes, hypotension and renally adjust medications -Repeat CMP in a.m.  Hypokalemia -The patient potassium this morning is 3.3 -Replete with p.o. potassium chloride 40 mg twice daily x2 doses -Continue to monitor and replete as necessary -Repeat CMP in a.m.  DVT prophylaxis: Anticoagulated with Apixaban 2.5 mg po BID  Code Status: FULL CODE  Family Communication: Daughter was at bedside and updated Disposition Plan: Pending cardiac clearance and titration off of Cardizem gtt and diuresis back to dry weight; she will need SNF; she is off the Cardizem drip but still continues to be diuresed and remains volume overloaded.  Cardiology is now will consider high-res CT scan to further evaluate for possible ILD  Status is: Inpatient  Remains inpatient appropriate because:Ongoing diagnostic testing needed not appropriate for outpatient work up, Unsafe d/c plan, IV treatments appropriate due to intensity of illness or inability to take PO and Inpatient level of care appropriate due to severity of illness   Dispo: The patient is from: Home              Anticipated d/c is to: SNF              Anticipated d/c date is: 2 days              Patient currently is not medically stable to d/c.  Consultants:   Cardiology   Procedures: ECHOCARDIOGRAM IMPRESSIONS    1. Left ventricular ejection fraction, by estimation, is 50 to 55%. The  left ventricle has low normal function. The left  ventricle has no regional  wall motion abnormalities. There is mild concentric left ventricular  hypertrophy. Left ventricular  diastolic parameters are indeterminate.  2. Right ventricular systolic function is mildly reduced. The right  ventricular size is normal. There is moderately elevated pulmonary artery  systolic pressure.  3. Left atrial size was severely dilated.  4. Right atrial size was severely dilated.  5. The mitral valve is normal in structure. Mild mitral valve  regurgitation. No evidence of mitral stenosis.  6. Tricuspid valve regurgitation is moderate to severe.  7. The aortic valve is normal in structure. Aortic valve regurgitation is  not visualized. No aortic stenosis is present.  8. The inferior vena  cava is normal in size with greater than 50%  respiratory variability, suggesting right atrial pressure of 3 mmHg.   FINDINGS  Left Ventricle: Left ventricular ejection fraction, by estimation, is 50  to 55%. The left ventricle has low normal function. The left ventricle has  no regional wall motion abnormalities. The left ventricular internal  cavity size was normal in size.  There is mild concentric left ventricular hypertrophy. Left ventricular  diastolic parameters are indeterminate.   Right Ventricle: The right ventricular size is normal. No increase in  right ventricular wall thickness. Right ventricular systolic function is  mildly reduced. There is moderately elevated pulmonary artery systolic  pressure. The tricuspid regurgitant  velocity is 3.30 m/s, and with an assumed right atrial pressure of 3 mmHg,  the estimated right ventricular systolic pressure is 61.9 mmHg.   Left Atrium: Left atrial size was severely dilated.   Right Atrium: Right atrial size was severely dilated.   Pericardium: There is no evidence of pericardial effusion.   Mitral Valve: The mitral valve is normal in structure. Normal mobility of  the mitral valve leaflets. Mild  mitral annular calcification. Mild mitral  valve regurgitation. No evidence of mitral valve stenosis.   Tricuspid Valve: The tricuspid valve is normal in structure. Tricuspid  valve regurgitation is moderate to severe. No evidence of tricuspid  stenosis.   Aortic Valve: The aortic valve is normal in structure.. There is moderate  thickening and moderate calcification of the aortic valve. Aortic valve  regurgitation is not visualized. No aortic stenosis is present. There is  moderate thickening of the aortic  valve. There is moderate calcification of the aortic valve. Aortic valve  mean gradient measures 4.0 mmHg. Aortic valve peak gradient measures 7.2  mmHg. Aortic valve area, by VTI measures 1.20 cm.   Pulmonic Valve: The pulmonic valve was normal in structure. Pulmonic valve  regurgitation is not visualized. No evidence of pulmonic stenosis.   Aorta: The aortic root is normal in size and structure.   Venous: The inferior vena cava is normal in size with greater than 50%  respiratory variability, suggesting right atrial pressure of 3 mmHg.   IAS/Shunts: No atrial level shunt detected by color flow Doppler.     LEFT VENTRICLE  PLAX 2D  LVIDd:     3.80 cm  LVIDs:     2.80 cm  LV PW:     1.20 cm  LV IVS:    1.37 cm  LVOT diam:   1.80 cm  LV SV:     32  LV SV Index:  22  LVOT Area:   2.54 cm     RIGHT VENTRICLE      IVC  RV Basal diam: 3.60 cm  IVC diam: 1.00 cm  RV Mid diam:  2.90 cm  RV S prime:   5.43 cm/s  TAPSE (M-mode): 0.9 cm   LEFT ATRIUM       Index    RIGHT ATRIUM      Index  LA diam:    3.80 cm 2.60 cm/m RA Area:   24.10 cm  LA Vol (A2C):  62.6 ml 42.84 ml/m RA Volume:  67.90 ml 46.46 ml/m  LA Vol (A4C):  50.0 ml 34.22 ml/m  LA Biplane Vol: 57.6 ml 39.42 ml/m  AORTIC VALVE  AV Area (Vmax):  1.43 cm  AV Area (Vmean):  1.22 cm  AV Area (VTI):   1.20 cm  AV Vmax:       134.00  cm/s  AV Vmean:     97.300 cm/s  AV VTI:      0.269 m  AV Peak Grad:   7.2 mmHg  AV Mean Grad:   4.0 mmHg  LVOT Vmax:     75.10 cm/s  LVOT Vmean:    46.500 cm/s  LVOT VTI:     0.127 m  LVOT/AV VTI ratio: 0.47    AORTA  Ao Root diam: 3.10 cm  Ao Asc diam: 3.00 cm   TRICUSPID VALVE  TR Peak grad:  43.6 mmHg  TR Vmax:    330.00 cm/s    SHUNTS  Systemic VTI: 0.13 m  Systemic Diam: 1.80 cm   Antimicrobials:  Anti-infectives (From admission, onward)   Start     Dose/Rate Route Frequency Ordered Stop   04/19/20 1600  Ampicillin-Sulbactam (UNASYN) 3 g in sodium chloride 0.9 % 100 mL IVPB     Discontinue     3 g 200 mL/hr over 30 Minutes Intravenous Every 12 hours 04/19/20 1520 04/23/20 2359   04/15/20 1630  cefTRIAXone (ROCEPHIN) 1 g in sodium chloride 0.9 % 100 mL IVPB        1 g 200 mL/hr over 30 Minutes Intravenous  Once 04/15/20 1620 04/15/20 1920   04/15/20 1630  azithromycin (ZITHROMAX) 500 mg in sodium chloride 0.9 % 250 mL IVPB        500 mg 250 mL/hr over 60 Minutes Intravenous  Once 04/15/20 1620 04/15/20 2046      Subjective: Seen and examined at bedside and she was sitting in the chair a little sleepy.  Denied any shortness of breath chest pain, lightheadedness or dizziness.  Daughter thinks that her legs are getting swollen.  No nausea or vomiting.  Patient's daughter also states her appetite is excellent.  No other concerns or complaints at this time  Objective: Vitals:   04/22/20 0544 04/22/20 0723 04/22/20 0917 04/22/20 1046  BP: 131/72 135/74 106/76   Pulse:  93 62   Resp:  16 18   Temp:  (!) 97.4 F (36.3 C)    TempSrc:  Oral    SpO2:  91% 95% 98%  Weight:      Height:        Intake/Output Summary (Last 24 hours) at 04/22/2020 1443 Last data filed at 04/22/2020 1320 Gross per 24 hour  Intake 629.21 ml  Output 600 ml  Net 29.21 ml   Filed Weights   04/20/20 0354 04/21/20 0443 04/22/20 0403  Weight: 49.9  kg 48.8 kg 50 kg   Examination: Physical Exam:  Constitutional: WN/WD thin elderly Caucasian female currently in no acute distress sitting in a chair bedside and appearing very sleepy. Eyes: Lids and conjunctivae normal, sclerae anicteric  ENMT: External Ears, Nose appear normal little hard of hearing Neck: Appears normal, supple, no cervical masses, normal ROM, no appreciable thyromegaly; no appreciable JVD Respiratory: Diminished to auscultation bilaterally with coarse breath sounds and some slight crackles, no wheezing, rales, rhonchi or crackles. Normal respiratory effort and patient is not tachypenic. No accessory muscle use.  Unlabored breathing but wearing 2 L of supplemental oxygen via nasal cannula Cardiovascular: Irregularly irregular mildly tachycardic, no murmurs / rubs / gallops.  1+ extremity edema Abdomen: Soft, non-tender, non-distended.  Bowel sounds positive. GU: Deferred. Musculoskeletal: No clubbing / cyanosis of digits/nails. No joint deformity upper and lower extremities.  Skin: No rashes, lesions, ulcers on limited skin evaluation. No induration; Warm and dry.  Neurologic: CN 2-12 grossly intact with  no focal deficits but she didn't talk very much. Romberg sign and cerebellar reflexes not assessed.  Psychiatric: Normal judgment and insight. Alert and oriented x 3. Normal mood and appropriate affect.   Data Reviewed: I have personally reviewed following labs and imaging studies  CBC: Recent Labs  Lab 04/18/20 0319 04/19/20 0658 04/20/20 0548 04/21/20 0524 04/22/20 0446  WBC 9.4 17.6* 8.9 7.7 6.6  NEUTROABS 7.3 15.8* 6.9 5.7 4.6  HGB 9.5* 9.6* 8.9* 9.5* 10.0*  HCT 29.6* 29.8* 28.9* 30.9* 31.8*  MCV 81.8 81.0 82.3 82.4 82.4  PLT 283 269 225 237 277   Basic Metabolic Panel: Recent Labs  Lab 04/18/20 0319 04/19/20 0658 04/20/20 0548 04/21/20 0524 04/22/20 0446  NA 137 137 138 139 139  K 3.6 3.2* 3.9 3.6 3.3*  CL 98 95* 96* 95* 92*  CO2 30 31 33* 35* 38*   GLUCOSE 101* 107* 94 94 98  BUN 25* 24* 22 19 18   CREATININE 1.45* 1.23* 1.21* 1.13* 1.12*  CALCIUM 8.2* 8.7* 8.5* 8.7* 8.7*  MG 1.7 1.7 2.2 1.9 2.0  PHOS 3.7 3.5 3.9 3.2 3.9   GFR: Estimated Creatinine Clearance: 25.4 mL/min (A) (by C-G formula based on SCr of 1.12 mg/dL (H)). Liver Function Tests: Recent Labs  Lab 04/18/20 0319 04/19/20 0658 04/20/20 0548 04/21/20 0524 04/22/20 0446  AST 19 24 26  35 39  ALT 15 16 17 24 27   ALKPHOS 52 52 52 47 47  BILITOT 0.7 0.8 0.7 0.7 0.8  PROT 6.3* 6.3* 6.2* 6.7 6.8  ALBUMIN 2.2* 2.3* 2.1* 2.2* 2.3*   No results for input(s): LIPASE, AMYLASE in the last 168 hours. No results for input(s): AMMONIA in the last 168 hours. Coagulation Profile: Recent Labs  Lab 04/15/20 1629  INR 1.5*   Cardiac Enzymes: No results for input(s): CKTOTAL, CKMB, CKMBINDEX, TROPONINI in the last 168 hours. BNP (last 3 results) No results for input(s): PROBNP in the last 8760 hours. HbA1C: No results for input(s): HGBA1C in the last 72 hours. CBG: No results for input(s): GLUCAP in the last 168 hours. Lipid Profile: No results for input(s): CHOL, HDL, LDLCALC, TRIG, CHOLHDL, LDLDIRECT in the last 72 hours. Thyroid Function Tests: No results for input(s): TSH, T4TOTAL, FREET4, T3FREE, THYROIDAB in the last 72 hours. Anemia Panel: No results for input(s): VITAMINB12, FOLATE, FERRITIN, TIBC, IRON, RETICCTPCT in the last 72 hours. Sepsis Labs: Recent Labs  Lab 04/15/20 2044 04/15/20 2055 04/16/20 0330 04/17/20 0846  PROCALCITON 1.23  --  1.06 0.51  LATICACIDVEN 1.2 0.7  --   --     Recent Results (from the past 240 hour(s))  Blood culture (routine x 2)     Status: None   Collection Time: 04/15/20  4:25 PM   Specimen: BLOOD RIGHT HAND  Result Value Ref Range Status   Specimen Description BLOOD RIGHT HAND  Final   Special Requests   Final    BOTTLES DRAWN AEROBIC AND ANAEROBIC Blood Culture results may not be optimal due to an inadequate volume  of blood received in culture bottles   Culture   Final    NO GROWTH 5 DAYS Performed at [facility], [place] [address], [place], [place] [address]    Report Status 04/20/2020 FINAL  Final  Blood culture (routine x 2)     Status: None   Collection Time: 04/15/20  4:40 PM   Specimen: BLOOD RIGHT ARM  Result Value Ref Range Status   Specimen Description BLOOD RIGHT ARM  Final   Special Requests   Final    BOTTLES DRAWN AEROBIC AND ANAEROBIC Blood Culture results may not be optimal due to an excessive volume of blood received in culture bottles   Culture   Final    NO GROWTH 5 DAYS Performed at [facility], [place] [address] [place], [place] [address]    Report Status 04/20/2020 FINAL  Final  SARS Coronavirus 2 by RT PCR (hospital order, performed in [facility] hospital lab) Nasopharyngeal Nasopharyngeal Swab     Status: None   Collection Time: 04/15/20  9:52 PM   Specimen: Nasopharyngeal Swab  Result Value Ref Range Status   SARS Coronavirus 2 NEGATIVE NEGATIVE Final    Comment: (NOTE) SARS-CoV-2 target nucleic acids are NOT DETECTED.  The SARS-CoV-2 RNA is generally detectable in upper and lower respiratory specimens during the acute phase of infection. The lowest concentration of SARS-CoV-2 viral copies this assay can detect is 250 copies / mL. A negative result does not preclude SARS-CoV-2 infection and should not be used as the sole basis for treatment or other patient management decisions.  A negative result may occur with improper specimen collection / handling, submission of specimen other than nasopharyngeal swab, presence of viral mutation(s) within the areas targeted by this assay, and inadequate number of viral copies (<250 copies / mL). A negative result must be combined with clinical observations, patient history, and epidemiological information.  Fact Sheet for Patients:   StrictlyIdeas.no  Fact Sheet for Healthcare  Providers: BankingDealers.co.za  This test is not yet approved or  cleared by the [place] FDA and has been authorized for detection and/or diagnosis of SARS-CoV-2 by FDA under an Emergency Use Authorization (EUA).  This EUA will remain in effect (meaning this test can be used) for the duration of the COVID-19 declaration under Section 564(b)(1) of the Act, 21 U.S.C. section 360bbb-3(b)(1), unless the authorization is terminated or revoked sooner.  Performed at [facility], [place] [address], [place], [place] [address]   Culture, blood (routine x 2)     Status: None (Preliminary result)   Collection Time: 04/19/20  9:54 AM   Specimen: BLOOD RIGHT HAND  Result Value Ref Range Status   Specimen Description BLOOD RIGHT HAND  Final   Special Requests   Final    BOTTLES DRAWN AEROBIC ONLY Blood Culture adequate volume   Culture   Final    NO GROWTH 3 DAYS Performed at [facility], [place] [address], [place], [place] [address]    Report Status PENDING  Incomplete  Culture, blood (routine x 2)     Status: None (Preliminary result)   Collection Time: 04/19/20  9:58 AM   Specimen: BLOOD LEFT HAND  Result Value Ref Range Status   Specimen Description BLOOD LEFT HAND  Final   Special Requests   Final    BOTTLES DRAWN AEROBIC ONLY Blood Culture adequate volume   Culture   Final    NO GROWTH 3 DAYS Performed at [facility], [address] [place], [place] [address]    Report Status PENDING  Incomplete  Culture, Urine     Status: None   Collection Time: 04/19/20  1:29 PM   Specimen: Urine, Random  Result Value Ref Range Status   Specimen Description URINE, RANDOM  Final   Special Requests NONE  Final   Culture   Final    NO GROWTH Performed at [facility], [place]  [address] [place], [place] [address]    Report Status 04/20/2020 FINAL  Final     RN Pressure Injury Documentation: Pressure Injury 04/17/20 Buttocks Left Stage 2 -   Partial thickness loss of dermis presenting as a shallow open injury with a red, pink wound bed without slough. (Active)  04/17/20 0132  Location: Buttocks  Location Orientation: Left  Staging: Stage 2 -  Partial thickness loss of dermis presenting as a shallow open injury with a red, pink wound bed without slough.  Wound Description (Comments):   Present on Admission:      Estimated body mass index is 21.53 kg/m as calculated from the following:   Height as of this encounter: 5' (1.524 m).   Weight as of this encounter: 50 kg.  Malnutrition Type:  Nutrition Problem: Severe Malnutrition Etiology: chronic illness (CHF)   Malnutrition Characteristics:  Signs/Symptoms: severe muscle depletion, severe fat depletion   Nutrition Interventions:  Interventions: Ensure Enlive (each supplement provides 350kcal and 20 grams of protein), MVI, Magic cup Radiology Studies: DG CHEST PORT 1 VIEW  Result Date: 04/22/2020 CLINICAL DATA:  Shortness of breath EXAM: PORTABLE CHEST 1 VIEW COMPARISON:  Portable exam 0656 hours compared to 04/21/2020 FINDINGS: Rotated to the RIGHT. Normal heart size, mediastinal contours, and pulmonary vascularity. Atherosclerotic calcification aorta. Chronic increased interstitial markings diffusely throughout both lungs. Persistent atelectasis versus consolidation LEFT lung base. No pleural effusion or pneumothorax. Bones demineralized. IMPRESSION: Chronic interstitial prominence with unchanged atelectasis versus consolidation at LEFT lung base. Electronically Signed   By: Lavonia Dana M.D.   On: 04/22/2020 09:16   DG CHEST PORT 1 VIEW  Result Date: 04/21/2020 CLINICAL DATA:  Shortness of breath EXAM: PORTABLE CHEST 1 VIEW COMPARISON:  04/20/2020 FINDINGS: Increased interstitial prominence and patchy density at the left lung base is without substantial change. No significant pleural effusion. No pneumothorax. Stable cardiomediastinal contours. IMPRESSION: No substantial  change since 04/20/2020 with persistent increased interstitial prominence and patchy density at the left lung base. Electronically Signed   By: Macy Mis M.D.   On: 04/21/2020 08:20   Scheduled Meds: . apixaban  2.5 mg Oral BID  . atorvastatin  20 mg Oral Daily  . brimonidine  1 drop Right Eye BID  . diltiazem  60 mg Oral Q6H  . feeding supplement (ENSURE ENLIVE)  237 mL Oral BID BM  . furosemide  40 mg Intravenous Q12H  . latanoprost  1 drop Both Eyes Q1200  . mouth rinse  15 mL Mouth Rinse BID  . metoprolol succinate  75 mg Oral Daily  . multivitamin with minerals  1 tablet Oral Daily  . Netarsudil Dimesylate  1 drop Right Eye Daily  . potassium chloride  40 mEq Oral BID  . sodium chloride flush  3 mL Intravenous Q12H   Continuous Infusions: . sodium chloride 250 mL (04/21/20 0439)  . ampicillin-sulbactam (UNASYN) IV 3 g (04/22/20 0333)    LOS: 7 days   Kerney Elbe, DO [facility] PAGER is on AMION  If 7PM-7AM, please contact night-coverage [email or web address]

## 2020-04-22 NOTE — Progress Notes (Addendum)
Progress Note  Patient Name: Kelli Peterson Date of Encounter: 04/22/2020  [facility] Cardiologist: Fransico Him, MD  (new this admission)  Subjective   Feeling OK today - appetite was good today. Denies any acute changes. HR still challenging at times, jumping to 120s-130s. Remains on supplemental O2. No CP or edema.  Inpatient Medications    Scheduled Meds: . apixaban  2.5 mg Oral BID  . atorvastatin  20 mg Oral Daily  . brimonidine  1 drop Right Eye BID  . diltiazem  30 mg Oral Q6H  . feeding supplement (ENSURE ENLIVE)  237 mL Oral BID BM  . furosemide  40 mg Intravenous Q12H  . latanoprost  1 drop Both Eyes Q1200  . mouth rinse  15 mL Mouth Rinse BID  . metoprolol succinate  75 mg Oral Daily  . multivitamin with minerals  1 tablet Oral Daily  . Netarsudil Dimesylate  1 drop Right Eye Daily  . potassium chloride  40 mEq Oral BID  . sodium chloride flush  3 mL Intravenous Q12H   Continuous Infusions: . sodium chloride 250 mL (04/21/20 0439)  . ampicillin-sulbactam (UNASYN) IV 3 g (04/22/20 0333)   PRN Meds: sodium chloride, acetaminophen, ipratropium-albuterol, ondansetron (ZOFRAN) IV, Resource ThickenUp Clear, sodium chloride flush   Vital Signs    Vitals:   04/22/20 0544 04/22/20 0723 04/22/20 0917 04/22/20 1046  BP: 131/72 135/74 106/76   Pulse:  93 62   Resp:  16 18   Temp:  (!) 97.4 F (36.3 C)    TempSrc:  Oral    SpO2:  91% 95% 98%  Weight:      Height:        Intake/Output Summary (Last 24 hours) at 04/22/2020 1144 Last data filed at 04/22/2020 0900 Gross per 24 hour  Intake 587.21 ml  Output 600 ml  Net -12.79 ml   Last 3 Weights 04/22/2020 04/21/2020 04/20/2020  Weight (lbs) 110 lb 3.7 oz 107 lb 9.4 oz 110 lb  Weight (kg) 50 kg 48.8 kg 49.896 kg      Telemetry    Atrial fib with variable rates 80s-130s - Personally Reviewed  Physical Exam   GEN: No acute distress, elderly, frail Neck: No JVD Cardiac: Irregularly irregular, slightly  tachycardic, no murmurs, rubs, or gallops.  Respiratory: Coarse to auscultation bilaterally with decreased BS at bases and crackles on the right lungfield nearly 2/3 up GI: Soft, nontender, non-distended  MS: No edema; No deformity. Neuro:  Nonfocal  Psych: Normal affect   Labs    High Sensitivity Troponin:  No results for input(s): TROPONINIHS in the last 720 hours.    Chemistry Recent Labs  Lab 04/20/20 0548 04/21/20 0524 04/22/20 0446  NA 138 139 139  K 3.9 3.6 3.3*  CL 96* 95* 92*  CO2 33* 35* 38*  GLUCOSE 94 94 98  BUN 22 19 18   CREATININE 1.21* 1.13* 1.12*  CALCIUM 8.5* 8.7* 8.7*  PROT 6.2* 6.7 6.8  ALBUMIN 2.1* 2.2* 2.3*  AST 26 35 39  ALT 17 24 27   ALKPHOS 52 47 47  BILITOT 0.7 0.7 0.8  GFRNONAA 40* 44* 44*  GFRAA 47* 51* 51*  ANIONGAP 9 9 9      Hematology Recent Labs  Lab 04/20/20 0548 04/21/20 0524 04/22/20 0446  WBC 8.9 7.7 6.6  RBC 3.51* 3.75* 3.86*  HGB 8.9* 9.5* 10.0*  HCT 28.9* 30.9* 31.8*  MCV 82.3 82.4 82.4  MCH 25.4* 25.3* 25.9*  MCHC 30.8  30.7 31.4  RDW 18.1* 18.3* 18.3*  PLT 225 237 257    BNP Recent Labs  Lab 04/15/20 1541  BNP 528.5*     DDimer No results for input(s): DDIMER in the last 168 hours.   Radiology    DG CHEST PORT 1 VIEW  Result Date: 04/22/2020 CLINICAL DATA:  Shortness of breath EXAM: PORTABLE CHEST 1 VIEW COMPARISON:  Portable exam 0656 hours compared to 04/21/2020 FINDINGS: Rotated to the RIGHT. Normal heart size, mediastinal contours, and pulmonary vascularity. Atherosclerotic calcification aorta. Chronic increased interstitial markings diffusely throughout both lungs. Persistent atelectasis versus consolidation LEFT lung base. No pleural effusion or pneumothorax. Bones demineralized. IMPRESSION: Chronic interstitial prominence with unchanged atelectasis versus consolidation at LEFT lung base. Electronically Signed   By: Lavonia Dana M.D.   On: 04/22/2020 09:16   DG CHEST PORT 1 VIEW  Result Date:  04/21/2020 CLINICAL DATA:  Shortness of breath EXAM: PORTABLE CHEST 1 VIEW COMPARISON:  04/20/2020 FINDINGS: Increased interstitial prominence and patchy density at the left lung base is without substantial change. No significant pleural effusion. No pneumothorax. Stable cardiomediastinal contours. IMPRESSION: No substantial change since 04/20/2020 with persistent increased interstitial prominence and patchy density at the left lung base. Electronically Signed   By: Macy Mis M.D.   On: 04/21/2020 08:20    Cardiac Studies   2D Echo 04/16/20 1. Left ventricular ejection fraction, by estimation, is 50 to 55%. The  left ventricle has low normal function. The left ventricle has no regional  wall motion abnormalities. There is mild concentric left ventricular  hypertrophy. Left ventricular  diastolic parameters are indeterminate.  2. Right ventricular systolic function is mildly reduced. The right  ventricular size is normal. There is moderately elevated pulmonary artery  systolic pressure.  3. Left atrial size was severely dilated.  4. Right atrial size was severely dilated.  5. The mitral valve is normal in structure. Mild mitral valve  regurgitation. No evidence of mitral stenosis.  6. Tricuspid valve regurgitation is moderate to severe.  7. The aortic valve is normal in structure. Aortic valve regurgitation is  not visualized. No aortic stenosis is present.  8. The inferior vena cava is normal in size with greater than 50%  respiratory variability, suggesting right atrial pressure of 3 mmHg.   Patient Profile     86 y.o. female atrial fibrillation on chronic a/c with Eliquis (historically managed by PCP), HTN, HLD, CKD stage II and chronic diastolic CHF who presented to [facility] with recent DOE, lower extremity edema, and orthopnea, found to have hypoxia and AF-RVR. She has been treated for both CHF and possible PNA (with leukocytosis). Covid test negative. Other issues include  metabolic acidosis, normocytic anemia, AKI on CKD stage III, hypokalemia.  Assessment & Plan    1. Acute hypoxic respiratory failure felt multifactorial related to acute on chronic diastolic CHF and possible pneumonia - 2D echo 04/16/20 showed EF 50-55%, mild LVH, RV function mildly reduced, moderately elevated PASP, severe biatrial enlargement, mild MR, moderate-severe TR - dx of PNA unclear - leukocytosis and elevated procalcitonin noted, so has been treated with antibiotics as well as covered for possible aspiration - CXR with chronic interstitial changes with atelectasis vs consolidation at left lung base - etiology of interstitial opacities not clear, question whether high resolution CT would be helpful to further evaluate especially given persistent tachycardia and O2 requirement despite diuresis, rising CO2, and crackles in lung exam - 3.6L thus far, weight extremely variable in chart, unclear if  accurate from the start - will await MD input on this, as well as diuretics  2. Chronic atrial fibrillation with RVR this admission - home regimen included diltiazem 180mg  daily, digoxin 0.125mg  daily. Here she is on Toprol 75mg  daily and diltiazem 30mg  q6hr with still suboptimal rates at times. - would continue beta blocker at present dose - titrate diltiazem to 60mg  q6hrs  - digoxin has been stopped this admission given elderly/renal insufficiency  - continue apixaban as tolerated at adjusted dose  3. Moderate pulmonary HTN - felt group 2 related to pulmonary venous HTN from CHF - diuretic plan as above  4. Essential HTN - controlled, managed in context of the above.  5. AKI superimposed on CKD stage II  - peak Cr 1.45, improved to 1.1 range and maintaining stability.  6. Hypokalemia - primary team repleting - Mg OK  7. Anemia - Hgb previously 13 range in 2020, now in the 8-10 range this admission. May be contributing to elevated rates. No bleeding reported. - further per IM -  hemoccult stool - needs close OP monitoring by primary care given anticoagulation   For questions or updates, please contact [facility] Please consult [email or web address] for contact info under        Signed, Charlie Pitter, PA-C  04/22/2020, 11:44 AM    Patient seen and examined with Melina Copa PA-C.  Agree as above, with the following exceptions and changes as noted below. No complaints today, continuing to address rate control. Gen: NAD, CV: irregular rhythm, tachycardic, no murmurs, Lungs: coarse crackles diffusely, Abd: soft, Extrem: Warm, well perfused, no edema, Neuro/Psych: alert and oriented x 3, normal mood and affect. All available labs, radiology testing, previous records reviewed. Will uptitrate diltiazem, and obtain CT chest to evaluate what component may still be volume related. Remainder as above. Discussed in detail with daughter present in room.   Elouise Munroe, MD

## 2020-04-22 NOTE — Plan of Care (Signed)
  Problem: Nutrition: Goal: Adequate nutrition will be maintained Outcome: Progressing   Problem: Elimination: Goal: Will not experience complications related to urinary retention Outcome: Progressing   Problem: Cardiac: Goal: Ability to achieve and maintain adequate cardiopulmonary perfusion will improve Outcome: Not Progressing

## 2020-04-22 NOTE — TOC Progression Note (Signed)
Transition of Care [facility]) - Progression Note    Patient Details  Name: Kelli Peterson MRN: [id number] Date of Birth: 1930-04-03  Transition of Care [facility]) CM/SW [place], [place] Phone Number: 04/22/2020, 8:33 AM  Clinical Narrative:     Insurance authorization has been approved from 7/20-7/22 [facility] authorization ID # [id number]. Contact is Alison Murray. CSW will need to send updated clinicals on 7/22.  Patient has SNF bed at [facility]. Insurance authorization has been approved.  CSW will continue to follow.  Expected Discharge Plan: [place] Barriers to Discharge: Continued Medical Work up  Expected Discharge Plan and Services Expected Discharge Plan: [place] arrangements for the past 2 months: Single Family Home                                       Social Determinants of Health (SDOH) Interventions    Readmission Risk Interventions No flowsheet data found.

## 2020-04-22 NOTE — Progress Notes (Signed)
  Speech Language Pathology Treatment: Dysphagia  Patient Details Name: Kelli Peterson MRN: [id number] DOB: [birth year] Today's Date: 04/22/2020 Time: 3202-3343 SLP Time Calculation (min) (ACUTE ONLY): 22 min  Assessment / Plan / Recommendation Clinical Impression  Pt was seen for dysphagia treatment with her daughter present. Pt's nurse reported that the pt has been tolerating the current diet without any overt s/sx of aspiration. Pt's daughter stated that the pt frequently coughs when she drinks but "has been doing real good on the nectars". Pt consumed solids without overt s/sx of aspiration. Mild lingual residue was cleared with a liquid wash. Pt exhibited coughing with 2/4 boluses of thin liquids via cup and with 1/4 boluses of nectar thick liquids, suggesting possible aspiration. A modified barium swallow study is recommended to further assess the physiology of the pt's swallow mechanism.    HPI HPI: Pt is an 86 y/o female admitted secondary to palpitations and SOB x3 weeks. Pt found to have acute respiratory failure with hypoxemia and a-fib with RVR. Pt was noted to cough with thin liquids and had an increase in WBC, prompting swallow evaluation. PMH including but not limited to Barrett's esophagus, cerebellar hemorrhage, HTN, CHF, a-fib and cervical cancer.      SLP Plan  New goals to be determined pending instrumental study;MBS       Recommendations  Diet recommendations: Dysphagia 2 (fine chop);Nectar-thick liquid Liquids provided via: Cup;Straw Medication Administration: Whole meds with puree Supervision: Full supervision/cueing for compensatory strategies Compensations: Minimize environmental distractions;Slow rate;Small sips/bites;Lingual sweep for clearance of pocketing;Monitor for anterior loss Postural Changes and/or Swallow Maneuvers: Seated upright 90 degrees                Oral Care Recommendations: Oral care BID Follow up Recommendations: Skilled Nursing  facility SLP Visit Diagnosis: Dysphagia, unspecified (R13.10) Plan: New goals to be determined pending instrumental study;MBS       Bastion Bolger I. Hardin Negus, [place], [place] Office number [phone] Pager [place] 04/22/2020, 12:16 PM

## 2020-04-23 ENCOUNTER — Inpatient Hospital Stay (HOSPITAL_COMMUNITY): Payer: Medicare Other

## 2020-04-23 DIAGNOSIS — J9601 Acute respiratory failure with hypoxia: Secondary | ICD-10-CM | POA: Diagnosis not present

## 2020-04-23 LAB — COMPREHENSIVE METABOLIC PANEL
ALT: 36 U/L (ref 0–44)
AST: 50 U/L — ABNORMAL HIGH (ref 15–41)
Albumin: 2.4 g/dL — ABNORMAL LOW (ref 3.5–5.0)
Alkaline Phosphatase: 46 U/L (ref 38–126)
Anion gap: 9 (ref 5–15)
BUN: 19 mg/dL (ref 8–23)
CO2: 35 mmol/L — ABNORMAL HIGH (ref 22–32)
Calcium: 9.3 mg/dL (ref 8.9–10.3)
Chloride: 97 mmol/L — ABNORMAL LOW (ref 98–111)
Creatinine, Ser: 1.15 mg/dL — ABNORMAL HIGH (ref 0.44–1.00)
GFR calc Af Amer: 50 mL/min — ABNORMAL LOW (ref >60)
GFR calc non Af Amer: 43 mL/min — ABNORMAL LOW (ref >60)
Glucose, Bld: 95 mg/dL (ref 70–99)
Potassium: 4.3 mmol/L (ref 3.5–5.1)
Sodium: 141 mmol/L (ref 135–145)
Total Bilirubin: 0.4 mg/dL (ref 0.3–1.2)
Total Protein: 6.6 g/dL (ref 6.5–8.1)

## 2020-04-23 LAB — CBC WITH DIFFERENTIAL/PLATELET
Abs Immature Granulocytes: 0.03 10*3/uL (ref 0.00–0.07)
Basophils Absolute: 0 10*3/uL (ref 0.0–0.1)
Basophils Relative: 1 %
Eosinophils Absolute: 0.3 10*3/uL (ref 0.0–0.5)
Eosinophils Relative: 6 %
HCT: 31.7 % — ABNORMAL LOW (ref 36.0–46.0)
Hemoglobin: 9.7 g/dL — ABNORMAL LOW (ref 12.0–15.0)
Immature Granulocytes: 1 %
Lymphocytes Relative: 14 %
Lymphs Abs: 0.8 10*3/uL (ref 0.7–4.0)
MCH: 25.3 pg — ABNORMAL LOW (ref 26.0–34.0)
MCHC: 30.6 g/dL (ref 30.0–36.0)
MCV: 82.8 fL (ref 80.0–100.0)
Monocytes Absolute: 0.8 10*3/uL (ref 0.1–1.0)
Monocytes Relative: 13 %
Neutro Abs: 3.9 10*3/uL (ref 1.7–7.7)
Neutrophils Relative %: 65 %
Platelets: 242 10*3/uL (ref 150–400)
RBC: 3.83 MIL/uL — ABNORMAL LOW (ref 3.87–5.11)
RDW: 18.5 % — ABNORMAL HIGH (ref 11.5–15.5)
WBC: 6 10*3/uL (ref 4.0–10.5)
nRBC: 0 % (ref 0.0–0.2)

## 2020-04-23 LAB — PHOSPHORUS: Phosphorus: 4.4 mg/dL (ref 2.5–4.6)

## 2020-04-23 LAB — MAGNESIUM: Magnesium: 2.1 mg/dL (ref 1.7–2.4)

## 2020-04-23 LAB — SARS CORONAVIRUS 2 (TAT 6-24 HRS): SARS Coronavirus 2: NEGATIVE

## 2020-04-23 MED ORDER — FUROSEMIDE 10 MG/ML IJ SOLN
40.0000 mg | Freq: Two times a day (BID) | INTRAMUSCULAR | Status: DC
  Administered 2020-04-23 – 2020-04-24 (×2): 40 mg via INTRAVENOUS
  Filled 2020-04-23 (×2): qty 4

## 2020-04-23 MED ORDER — DILTIAZEM HCL ER COATED BEADS 240 MG PO CP24
240.0000 mg | ORAL_CAPSULE | Freq: Every day | ORAL | Status: DC
  Administered 2020-04-24: 240 mg via ORAL
  Filled 2020-04-23: qty 1

## 2020-04-23 NOTE — Progress Notes (Signed)
PROGRESS NOTE    Kelli SYMMONDS  Peterson:[id number] DOB: 1930/05/24 DOA: 04/15/2020 PCP: Jani Gravel, MD   Brief Narrative:   Kelli Peterson a 86 y.o.femalewith medical history significant ofA. fib-on Eliquis, hypertension, hyperlipidemia, chronic diastolic CHF presents to emergency department with palpitation and shortness of breath since 3 weeks.  7/21: In good spirits today. MBS ordered. Dys 3 diet ordered.    Assessment & Plan:   Principal Problem:   Acute hypoxemic respiratory failure (HCC) Active Problems:   Atrial fibrillation with RVR (HCC)   Essential hypertension   CKD (chronic kidney disease), stage III   Pressure injury of skin   Acute on chronic diastolic heart failure (HCC)  Acute Respiratory Failure with Hypoxia secondary to acute on chronic diastolic CHF and Asp PNA     - currently on RA and stable     - Continue to fluid Restrict to 1500 mL     - echo showed a left ventricular ejection fraction 50 to 55% with left ventricular having a low normal function and left ventricular has no regional wall more action abnormalities.  There is mild concentric left ventricular hypertrophy and left ventricular diastolic parameters were indeterminate and patient also had right ventricular systolic function is mildly reduced     - Procalcitonin level was 1.23 and trended down to 0.51; had leukocytosis of 17.6 so antibiotics were resumed as there is concern for aspirating; completed 5 day course of Unasyn     - cardiology ordered high rates CT scan to rule out ILD: Nonspecific patchy ground-glass opacity and septal thickening throughout both lungs, most prominent in the lower lobes. Favor a combination of pulmonary edema, nonspecific postinfectious/postinflammatory scarring and atelectasis. An underlying basilar predominant interstitial lung disease is less favored but difficult to entirely exclude. Follow-up high-resolution chest CT could be considered in 6 months as clinically  warranted.     - SLP has completed MBS today; now on Dys 3 diet, see their note  Persistent A. Fib with RVR     - Recently has been seeing her PCP who have been adjusting medication and she has had prior to admission medications with metoprolol XL 25 mg p.o. daily, diltiazem 180 mg p.o. daily, hydroxyzine 0.125 mg daily and she is currently anticoagulated with low-dose Eliquis     - In the ED she was given IV metoprolol and IV diltiazem.     - per cards: Toprol 75mg  daily diltiazem to 60mg  q6hrs  Acquired Thrombophilia     - CHA2DS2-VASc is at least a 5 for Age, Sex, HTN, and CHF Hx     - Continue with low-dose Eliquis 2.5 mg p.o. twice daily given that her age is above 32 and that her weight is below 60 kg  Hypertension     - Toprol 75mg  daily diltiazem to 60mg  q6hrs  Hyperlipidemia     - Continue Atorvastatin 20 mg po Daily   Pulmonary Hypertension      - Moderate and likely group 2 related to pulmonary versus hypotension from CHF     - She has moderate to severe tricuspid regurg and a right ventricular function is mildly reduced     - Cardiology recommending continue IV diuretics atleast another day  Metabolic Acidosis, improved     - The patient's CO2 initially was 21, anion gap was 14, chloride level was 104; her CO2 is now 35, chloride level is 94, anion gap is 9     - resolved  Normocytic Anemia     -  no evidence of bleed. Hgb stable, follow  AKI onCKD stage IIIb     - SCr is 1.15 this AM, stable, follow  Hypokalemia     - replaced, follow, K+ 4.3 this AM.  DVT prophylaxis: eliquis Code Status: FULL Family Communication: With family at bedside   Status is: Inpatient  Remains inpatient appropriate because:IV treatments appropriate due to intensity of illness or inability to take PO   Dispo: The patient is from: Home              Anticipated d/c is to: SNF              Anticipated d/c date is: 2 days              Patient currently is not medically stable  to d/c.  Consultants:   Cardiology  ROS:  Denies CP, N, V, dyspnea . Remainder ROS is negative for all not previously mentioned.  Subjective: "Ok"  Objective: Vitals:   04/23/20 0400 04/23/20 0845 04/23/20 1255 04/23/20 1621  BP: (!) 116/57 (!) 111/53 124/89 112/71  Pulse: 72 (!) 105  95  Resp: 15   (!) 22  Temp: 97.9 F (36.6 C)   97.7 F (36.5 C)  TempSrc: Oral   Oral  SpO2: 94%   90%  Weight: 52.3 kg     Height:        Intake/Output Summary (Last 24 hours) at 04/23/2020 1840 Last data filed at 04/23/2020 1648 Gross per 24 hour  Intake 120 ml  Output 1250 ml  Net -1130 ml   Filed Weights   04/21/20 0443 04/22/20 0403 04/23/20 0400  Weight: 48.8 kg 50 kg 52.3 kg    Examination:  General: 86 y.o. female resting in bed in NAD Cardiovascular: RRR, +S1, S2, no m/g/r, equal pulses throughout Respiratory: CTABL, no w/r/r, normal WOB GI: BS+, NDNT, no masses noted, no organomegaly noted MSK: No e/c/c Neuro: alert and following commands Psyc: calm/cooperative   Data Reviewed: I have personally reviewed following labs and imaging studies.  CBC: Recent Labs  Lab 04/19/20 0658 04/20/20 0548 04/21/20 0524 04/22/20 0446 04/23/20 0634  WBC 17.6* 8.9 7.7 6.6 6.0  NEUTROABS 15.8* 6.9 5.7 4.6 3.9  HGB 9.6* 8.9* 9.5* 10.0* 9.7*  HCT 29.8* 28.9* 30.9* 31.8* 31.7*  MCV 81.0 82.3 82.4 82.4 82.8  PLT 269 225 237 257 937   Basic Metabolic Panel: Recent Labs  Lab 04/19/20 0658 04/20/20 0548 04/21/20 0524 04/22/20 0446 04/23/20 0634  NA 137 138 139 139 141  K 3.2* 3.9 3.6 3.3* 4.3  CL 95* 96* 95* 92* 97*  CO2 31 33* 35* 38* 35*  GLUCOSE 107* 94 94 98 95  BUN 24* 22 19 18 19   CREATININE 1.23* 1.21* 1.13* 1.12* 1.15*  CALCIUM 8.7* 8.5* 8.7* 8.7* 9.3  MG 1.7 2.2 1.9 2.0 2.1  PHOS 3.5 3.9 3.2 3.9 4.4   GFR: Estimated Creatinine Clearance: 24.8 mL/min (A) (by C-G formula based on SCr of 1.15 mg/dL (H)). Liver Function Tests: Recent Labs  Lab 04/19/20 0658  04/20/20 0548 04/21/20 0524 04/22/20 0446 04/23/20 0634  AST 24 26 35 39 50*  ALT 16 17 24 27  36  ALKPHOS 52 52 47 47 46  BILITOT 0.8 0.7 0.7 0.8 0.4  PROT 6.3* 6.2* 6.7 6.8 6.6  ALBUMIN 2.3* 2.1* 2.2* 2.3* 2.4*   No results for input(s): LIPASE, AMYLASE in the last 168 hours. No results for input(s): AMMONIA in the last 168 hours.  Coagulation Profile: No results for input(s): INR, PROTIME in the last 168 hours. Cardiac Enzymes: No results for input(s): CKTOTAL, CKMB, CKMBINDEX, TROPONINI in the last 168 hours. BNP (last 3 results) No results for input(s): PROBNP in the last 8760 hours. HbA1C: No results for input(s): HGBA1C in the last 72 hours. CBG: No results for input(s): GLUCAP in the last 168 hours. Lipid Profile: No results for input(s): CHOL, HDL, LDLCALC, TRIG, CHOLHDL, LDLDIRECT in the last 72 hours. Thyroid Function Tests: No results for input(s): TSH, T4TOTAL, FREET4, T3FREE, THYROIDAB in the last 72 hours. Anemia Panel: No results for input(s): VITAMINB12, FOLATE, FERRITIN, TIBC, IRON, RETICCTPCT in the last 72 hours. Sepsis Labs: Recent Labs  Lab 04/17/20 0846  PROCALCITON 0.51    Recent Results (from the past 240 hour(s))  Blood culture (routine x 2)     Status: None   Collection Time: 04/15/20  4:25 PM   Specimen: BLOOD RIGHT HAND  Result Value Ref Range Status   Specimen Description BLOOD RIGHT HAND  Final   Special Requests   Final    BOTTLES DRAWN AEROBIC AND ANAEROBIC Blood Culture results may not be optimal due to an inadequate volume of blood received in culture bottles   Culture   Final    NO GROWTH 5 DAYS Performed at [facility], [place] [address], [place], [place] [address]    Report Status 04/20/2020 FINAL  Final  Blood culture (routine x 2)     Status: None   Collection Time: 04/15/20  4:40 PM   Specimen: BLOOD RIGHT ARM  Result Value Ref Range Status   Specimen Description BLOOD RIGHT ARM  Final   Special Requests   Final     BOTTLES DRAWN AEROBIC AND ANAEROBIC Blood Culture results may not be optimal due to an excessive volume of blood received in culture bottles   Culture   Final    NO GROWTH 5 DAYS Performed at [facility], [place] [address] [place], [place] [address]    Report Status 04/20/2020 FINAL  Final  SARS Coronavirus 2 by RT PCR (hospital order, performed in [facility] hospital lab) Nasopharyngeal Nasopharyngeal Swab     Status: None   Collection Time: 04/15/20  9:52 PM   Specimen: Nasopharyngeal Swab  Result Value Ref Range Status   SARS Coronavirus 2 NEGATIVE NEGATIVE Final    Comment: (NOTE) SARS-CoV-2 target nucleic acids are NOT DETECTED.  The SARS-CoV-2 RNA is generally detectable in upper and lower respiratory specimens during the acute phase of infection. The lowest concentration of SARS-CoV-2 viral copies this assay can detect is 250 copies / mL. A negative result does not preclude SARS-CoV-2 infection and should not be used as the sole basis for treatment or other patient management decisions.  A negative result may occur with improper specimen collection / handling, submission of specimen other than nasopharyngeal swab, presence of viral mutation(s) within the areas targeted by this assay, and inadequate number of viral copies (<250 copies / mL). A negative result must be combined with clinical observations, patient history, and epidemiological information.  Fact Sheet for Patients:   StrictlyIdeas.no  Fact Sheet for Healthcare Providers: BankingDealers.co.za  This test is not yet approved or  cleared by the [place] FDA and has been authorized for detection and/or diagnosis of SARS-CoV-2 by FDA under an Emergency Use Authorization (EUA).  This EUA will remain in effect (meaning this test can be used) for the duration of the COVID-19 declaration under  Section 564(b)(1) of the Act, 21 U.S.C. section 360bbb-3(b)(1), unless  the authorization is terminated or revoked sooner.  Performed at [facility], [place] [address] [place], [place] [address]   Culture, blood (routine x 2)     Status: None (Preliminary result)   Collection Time: 04/19/20  9:54 AM   Specimen: BLOOD RIGHT HAND  Result Value Ref Range Status   Specimen Description BLOOD RIGHT HAND  Final   Special Requests   Final    BOTTLES DRAWN AEROBIC ONLY Blood Culture adequate volume   Culture   Final    NO GROWTH 4 DAYS Performed at [facility], [place] [address] [place], [place] [address]    Report Status PENDING  Incomplete  Culture, blood (routine x 2)     Status: None (Preliminary result)   Collection Time: 04/19/20  9:58 AM   Specimen: BLOOD LEFT HAND  Result Value Ref Range Status   Specimen Description BLOOD LEFT HAND  Final   Special Requests   Final    BOTTLES DRAWN AEROBIC ONLY Blood Culture adequate volume   Culture   Final    NO GROWTH 4 DAYS Performed at [facility], [place] [address], [place], [place] [address]    Report Status PENDING  Incomplete  Culture, Urine     Status: None   Collection Time: 04/19/20  1:29 PM   Specimen: Urine, Random  Result Value Ref Range Status   Specimen Description URINE, RANDOM  Final   Special Requests NONE  Final   Culture   Final    NO GROWTH Performed at [facility], [place] [address], [place], [place] [address]    Report Status 04/20/2020 FINAL  Final      Radiology Studies: CT Chest High Resolution  Result Date: 04/22/2020 CLINICAL DATA:  Inpatient. Respiratory failure. Persistent oxygen requirement. Abnormal chest radiograph. EXAM: CT CHEST WITHOUT CONTRAST TECHNIQUE: Multidetector CT imaging of the chest was performed following the standard protocol without intravenous contrast. High resolution imaging of the lungs, as well as inspiratory and expiratory imaging, was performed. COMPARISON:  Chest radiograph from earlier today. FINDINGS: Motion degraded scan,  limiting assessment. Cardiovascular: Moderate cardiomegaly. No significant pericardial effusion/thickening. Three-vessel coronary atherosclerosis. Atherosclerotic nonaneurysmal thoracic aorta. Dilated main pulmonary artery (3.9 cm diameter). Mediastinum/Nodes: No discrete thyroid nodules. Unremarkable esophagus. No axillary adenopathy. Multiple enlarged right paratracheal nodes up to 1.8 cm short axis diameter (series 3/image 49). No discrete hilar adenopathy on these noncontrast images. Lungs/Pleura: No pneumothorax. Trace dependent left pleural effusion. No right pleural effusion. Mosaic attenuation throughout both lungs, without convincing air trapping on the limited expiration sequence. Diffuse bronchial wall thickening. Patchy ground-glass opacity and interlobular septal thickening throughout both lungs, most prominent in the lower lobes. Hypoventilatory changes in the dependent lower lobes. No lung masses or significant pulmonary nodules. No frank honeycombing. No significant regions of bronchiectasis. Upper abdomen: Simple 4.4 cm posterior upper left renal cyst. Musculoskeletal: No aggressive appearing focal osseous lesions. Severe cough T3, T7, T8 and T9 vertebral compression fractures, worsened at T8 and otherwise stable since 08/07/2019 radiographs. Moderate thoracic spondylosis. IMPRESSION: 1. Moderate cardiomegaly. Dilated main pulmonary artery, suggesting pulmonary arterial hypertension. Trace dependent left pleural effusion. 2. Mosaic attenuation in both lungs, favoring mosaic perfusion due to pulmonary vascular disease. 3. Nonspecific patchy ground-glass opacity and septal thickening throughout both lungs, most prominent in the lower lobes. Favor a combination of pulmonary edema, nonspecific postinfectious/postinflammatory scarring and atelectasis. An underlying basilar predominant  interstitial lung disease is less favored but difficult to entirely exclude. Follow-up high-resolution chest CT could be  considered in 6 months as clinically warranted. 4. Nonspecific diffuse bronchial wall thickening, suggesting chronic bronchitis and/or reactive airways disease. 5. Three-vessel coronary atherosclerosis. 6. Nonspecific mild mediastinal lymphadenopathy, potentially reactive. 7. Severe chronic T3, T7, T8 and T9 vertebral compression fractures. 8. Aortic Atherosclerosis (ICD10-I70.0). Electronically Signed   By: Ilona Sorrel M.D.   On: 04/22/2020 17:01   DG CHEST PORT 1 VIEW  Result Date: 04/23/2020 CLINICAL DATA:  Shortness of breath. EXAM: PORTABLE CHEST 1 VIEW COMPARISON:  April 22, 2020. FINDINGS: Stable cardiomediastinal silhouette. No pneumothorax or pleural effusion is noted. Stable diffuse interstitial densities are noted throughout both lungs which may represent scarring, although superimposed inflammation or edema cannot be excluded. Old right rib fractures are noted. IMPRESSION: Stable diffuse interstitial densities are noted throughout both lungs which may represent scarring, although superimposed inflammation or edema cannot be excluded. Aortic Atherosclerosis (ICD10-I70.0). Electronically Signed   By: Marijo Conception M.D.   On: 04/23/2020 08:35   DG CHEST PORT 1 VIEW  Result Date: 04/22/2020 CLINICAL DATA:  Shortness of breath EXAM: PORTABLE CHEST 1 VIEW COMPARISON:  Portable exam 0656 hours compared to 04/21/2020 FINDINGS: Rotated to the RIGHT. Normal heart size, mediastinal contours, and pulmonary vascularity. Atherosclerotic calcification aorta. Chronic increased interstitial markings diffusely throughout both lungs. Persistent atelectasis versus consolidation LEFT lung base. No pleural effusion or pneumothorax. Bones demineralized. IMPRESSION: Chronic interstitial prominence with unchanged atelectasis versus consolidation at LEFT lung base. Electronically Signed   By: Lavonia Dana M.D.   On: 04/22/2020 09:16   DG Swallowing Func-Speech Pathology  Result Date: 04/23/2020 Objective Swallowing  Evaluation: Type of Study: MBS-Modified Barium Swallow Study  Patient Details Name: Kelli Peterson MRN: [id number] Date of Birth: 09-07-1930 Today's Date: 04/23/2020 Time: SLP Start Time (ACUTE ONLY): 1116 -SLP Stop Time (ACUTE ONLY): 1135 SLP Time Calculation (min) (ACUTE ONLY): 19 min Past Medical History: Past Medical History: Diagnosis Date . Adenomatous colon polyp 2007 . Atrial fibrillation ([place])  . Barrett's esophagus 2007 . Cerebellar hemorrhage ([place])  . Cervical cancer ([place])  . CHF (congestive heart failure) ([place])  . Closed pelvic fracture ([place])  . CVD (cardiovascular disease)  . DVT (deep venous thrombosis) ([place])  . Fibrocystic breast disease  . Fracture of pubic ramus ([place])  . Hyperlipidemia  . Hypertension  . Iron deficiency anemia  Past Surgical History: Past Surgical History: Procedure Laterality Date . CATARACT EXTRACTION Right 02/15/2010  [facility] . CATARACT EXTRACTION Left 01/30/2010  [facility] . CATARACT EXTRACTION, BILATERAL  2010 . TOTAL ABDOMINAL HYSTERECTOMY  1964 HPI: Pt is an 86 y/o female admitted secondary to palpitations and SOB x3 weeks. Pt found to have acute respiratory failure with hypoxemia and a-fib with RVR. Pt was noted to cough with thin liquids and had an increase in WBC, prompting swallow evaluation. PMH including but not limited to Barrett's esophagus, cerebellar hemorrhage, HTN, CHF, a-fib and cervical cancer. CXR 7/21: Stable diffuse interstitial densities are noted throughout both lungs which may represent scarring, although superimposed inflammation or edema cannot be excluded.  Subjective: says she has trouble swallowing, dtr agrees - has been going on for a while Assessment / Plan / Recommendation CHL IP CLINICAL IMPRESSIONS 04/23/2020 Clinical Impression Pt presents with oropharyngeal dysphagia characterized by reduced bolus cohesion, reduced lingual retraction, reduced pharyngeal constriction, a pharyngeal delay, and reduced anterior laryngeal movement. She demonstrated  premature spillage to the valleculae and pyriform sinuses,  mild vallecular residue, mild pyriform sinus residue, trace posterior pharyngeal wall residue, silent aspiration (PAS 8) of thin liquids, and penetration (PAS 3,5) as well as inconsistently sensed aspiration (PAS 7,8) of nectar thick liquids when a chin tuck posture was not utilized. Mild backflow of material was noted from the lower to upper thoracic esophagus but not to the pharynx. A dysphagia 3 diet with nectar thick liquids is recommended at this time with strict observance of swallowing precautions including consistent use of chin tuck posture. SLP will follow for dysphagia treatment.  SLP Visit Diagnosis Dysphagia, oropharyngeal phase (R13.12) Attention and concentration deficit following -- Frontal lobe and executive function deficit following -- Impact on safety and function Moderate aspiration risk;Mild aspiration risk   CHL IP TREATMENT RECOMMENDATION 04/23/2020 Treatment Recommendations Therapy as outlined in treatment plan below   Prognosis 04/23/2020 Prognosis for Safe Diet Advancement Fair Barriers to Reach Goals Time post onset;Severity of deficits Barriers/Prognosis Comment -- CHL IP DIET RECOMMENDATION 04/23/2020 SLP Diet Recommendations Dysphagia 3 (Mech soft) solids;Nectar thick liquid Liquid Administration via Cup;No straw Medication Administration Whole meds with puree Compensations Minimize environmental distractions;Slow rate;Small sips/bites;Follow solids with liquid;Chin tuck Postural Changes Remain semi-upright after after feeds/meals (Comment);Seated upright at 90 degrees   CHL IP OTHER RECOMMENDATIONS 04/23/2020 Recommended Consults -- Oral Care Recommendations Oral care BID Other Recommendations Order thickener from pharmacy;Prohibited food (jello, ice cream, thin soups);Remove water pitcher;Have oral suction available   CHL IP FOLLOW UP RECOMMENDATIONS 04/23/2020 Follow up Recommendations Skilled Nursing facility   [facility] IP FREQUENCY  AND DURATION 04/23/2020 Speech Therapy Frequency (ACUTE ONLY) min 2x/week Treatment Duration 2 weeks      CHL IP ORAL PHASE 04/23/2020 Oral Phase Impaired Oral - Pudding Teaspoon -- Oral - Pudding Cup -- Oral - Honey Teaspoon -- Oral - Honey Cup Premature spillage;Decreased bolus cohesion Oral - Nectar Teaspoon -- Oral - Nectar Cup Premature spillage;Decreased bolus cohesion Oral - Nectar Straw Premature spillage;Decreased bolus cohesion Oral - Thin Teaspoon -- Oral - Thin Cup Premature spillage;Decreased bolus cohesion Oral - Thin Straw -- Oral - Puree Premature spillage;Decreased bolus cohesion Oral - Mech Soft WFL Oral - Regular Impaired mastication Oral - Multi-Consistency -- Oral - Pill -- Oral Phase - Comment --  CHL IP PHARYNGEAL PHASE 04/23/2020 Pharyngeal Phase Impaired Pharyngeal- Pudding Teaspoon -- Pharyngeal -- Pharyngeal- Pudding Cup -- Pharyngeal -- Pharyngeal- Honey Teaspoon -- Pharyngeal -- Pharyngeal- Honey Cup Delayed swallow initiation-vallecula;Reduced anterior laryngeal mobility;Pharyngeal residue - valleculae;Reduced tongue base retraction;Pharyngeal residue - posterior pharnyx Pharyngeal -- Pharyngeal- Nectar Teaspoon -- Pharyngeal -- Pharyngeal- Nectar Cup Delayed swallow initiation-vallecula;Reduced anterior laryngeal mobility;Pharyngeal residue - valleculae;Reduced tongue base retraction;Pharyngeal residue - posterior pharnyx;Delayed swallow initiation-pyriform sinuses;Penetration/Aspiration before swallow;Penetration/Aspiration during swallow Pharyngeal Material enters airway, remains ABOVE vocal cords and not ejected out;Material enters airway, CONTACTS cords and not ejected out;Material enters airway, passes BELOW cords without attempt by patient to eject out (silent aspiration) Pharyngeal- Nectar Straw Delayed swallow initiation-vallecula;Reduced anterior laryngeal mobility;Pharyngeal residue - valleculae;Reduced tongue base retraction;Pharyngeal residue - posterior  pharnyx;Penetration/Aspiration before swallow;Penetration/Aspiration during swallow Pharyngeal Material enters airway, passes BELOW cords and not ejected out despite cough attempt by patient;Material enters airway, CONTACTS cords and not ejected out Pharyngeal- Thin Teaspoon -- Pharyngeal -- Pharyngeal- Thin Cup Delayed swallow initiation-vallecula;Reduced anterior laryngeal mobility;Pharyngeal residue - valleculae;Reduced tongue base retraction;Pharyngeal residue - posterior pharnyx;Penetration/Aspiration before swallow;Penetration/Aspiration during swallow Pharyngeal Material enters airway, passes BELOW cords without attempt by patient to eject out (silent aspiration) Pharyngeal- Thin Straw -- Pharyngeal -- Pharyngeal- Puree Reduced anterior  laryngeal mobility;Pharyngeal residue - valleculae;Reduced tongue base retraction;Pharyngeal residue - posterior pharnyx Pharyngeal -- Pharyngeal- Mechanical Soft Reduced anterior laryngeal mobility;Pharyngeal residue - valleculae;Reduced tongue base retraction;Pharyngeal residue - posterior pharnyx Pharyngeal -- Pharyngeal- Regular Reduced anterior laryngeal mobility;Pharyngeal residue - valleculae;Reduced tongue base retraction;Pharyngeal residue - posterior pharnyx Pharyngeal -- Pharyngeal- Multi-consistency -- Pharyngeal -- Pharyngeal- Pill Reduced anterior laryngeal mobility;Pharyngeal residue - valleculae;Reduced tongue base retraction;Pharyngeal residue - posterior pharnyx Pharyngeal -- Pharyngeal Comment --  CHL IP CERVICAL ESOPHAGEAL PHASE 04/23/2020 Cervical Esophageal Phase See Impressions  Pudding Teaspoon -- Pudding Cup -- Honey Teaspoon -- Honey Cup -- Nectar Teaspoon -- Nectar Cup -- Nectar Straw -- Thin Teaspoon -- Thin Cup -- Thin Straw -- Puree -- Mechanical Soft -- Regular -- Multi-consistency -- Pill -- Cervical Esophageal Comment -- Shanika I. Hardin Negus, [place], [place] Office number [phone] Pager [place] 04/23/2020, 2:06 PM                Scheduled Meds: . apixaban  2.5 mg Oral BID  . atorvastatin  20 mg Oral Daily  . brimonidine  1 drop Right Eye BID  . [START ON 04/24/2020] diltiazem  240 mg Oral Daily  . diltiazem  60 mg Oral Q6H  . feeding supplement (ENSURE ENLIVE)  237 mL Oral BID BM  . furosemide  40 mg Intravenous BID  . latanoprost  1 drop Both Eyes Q1200  . mouth rinse  15 mL Mouth Rinse BID  . metoprolol succinate  75 mg Oral Daily  . multivitamin with minerals  1 tablet Oral Daily  . Netarsudil Dimesylate  1 drop Right Eye Daily  . sodium chloride flush  3 mL Intravenous Q12H   Continuous Infusions: . sodium chloride 250 mL (04/21/20 0439)     LOS: 8 days    Time spent: 35 minutes spent in the coordination of care today.    Jonnie Finner, DO [facility]  If 7PM-7AM, please contact night-coverage [email or web address] 04/23/2020, 6:40 PM

## 2020-04-23 NOTE — Progress Notes (Addendum)
Modified Barium Swallow Progress Note  Patient Details  Name: Kelli Peterson MRN: [id number] Date of Birth: 1930/01/04  Today's Date: 04/23/2020  Modified Barium Swallow completed.  Full report located under Chart Review in the Imaging Section.  Brief recommendations include the following:  Clinical Impression  Pt presents with oropharyngeal dysphagia characterized by reduced bolus cohesion, reduced lingual retraction, reduced pharyngeal constriction, a pharyngeal delay, and reduced anterior laryngeal movement. She demonstrated premature spillage to the valleculae and pyriform sinuses, mild vallecular residue, mild pyriform sinus residue, trace posterior pharyngeal wall residue, silent aspiration (PAS 8) of thin liquids, and penetration (PAS 3,5) as well as inconsistently sensed aspiration (PAS 7,8) of nectar thick liquids when a chin tuck posture was not utilized. Mild backflow of material was noted from the lower to upper thoracic esophagus but not to the pharynx. A dysphagia 3 diet with nectar thick liquids is recommended at this time with strict observance of swallowing precautions including consistent use of chin tuck posture. SLP will follow for dysphagia treatment.    Swallow Evaluation Recommendations       SLP Diet Recommendations: Dysphagia 3 (Mech soft) solids;Nectar thick liquid   Liquid Administration via: Cup;No straw   Medication Administration: Whole meds with puree   Supervision: Full supervision/cueing for compensatory strategies;Staff to assist with self feeding   Compensations: Minimize environmental distractions;Slow rate;Small sips/bites;Follow solids with liquid;Chin tuck   Postural Changes: Remain semi-upright after after feeds/meals (Comment);Seated upright at 90 degrees   Oral Care Recommendations: Oral care BID   Other Recommendations: Order thickener from pharmacy;Prohibited food (jello, ice cream, thin soups);Remove water pitcher;Have oral suction  available   Colon Rueth I. Hardin Negus, [place], [place] Office number [phone] Pager [phone]  Horton Marshall 04/23/2020,2:06 PM

## 2020-04-23 NOTE — Progress Notes (Signed)
Progress Note  Patient Name: Kelli Peterson Date of Encounter: 04/23/2020  [facility] Cardiologist: Fransico Him, MD  (new this admission)  Subjective   Sleeping, feeling ok today. HR improved. Discussed with daughter Arbie Cookey over past several days.   Inpatient Medications    Scheduled Meds: . apixaban  2.5 mg Oral BID  . atorvastatin  20 mg Oral Daily  . brimonidine  1 drop Right Eye BID  . diltiazem  60 mg Oral Q6H  . feeding supplement (ENSURE ENLIVE)  237 mL Oral BID BM  . latanoprost  1 drop Both Eyes Q1200  . mouth rinse  15 mL Mouth Rinse BID  . metoprolol succinate  75 mg Oral Daily  . multivitamin with minerals  1 tablet Oral Daily  . Netarsudil Dimesylate  1 drop Right Eye Daily  . sodium chloride flush  3 mL Intravenous Q12H   Continuous Infusions: . sodium chloride 250 mL (04/21/20 0439)  . ampicillin-sulbactam (UNASYN) IV 3 g (04/23/20 0404)   PRN Meds: sodium chloride, acetaminophen, ipratropium-albuterol, ondansetron (ZOFRAN) IV, Resource ThickenUp Clear, sodium chloride flush   Vital Signs    Vitals:   04/22/20 1933 04/23/20 0011 04/23/20 0400 04/23/20 0845  BP: 106/70 118/62 (!) 116/57 (!) 111/53  Pulse: 96 72 72 (!) 105  Resp: 20 15 15    Temp: 97.8 F (36.6 C) 97.7 F (36.5 C) 97.9 F (36.6 C)   TempSrc: Oral Oral Oral   SpO2: 95% 96% 94%   Weight:   52.3 kg   Height:        Intake/Output Summary (Last 24 hours) at 04/23/2020 1008 Last data filed at 04/23/2020 0900 Gross per 24 hour  Intake 564 ml  Output 1250 ml  Net -686 ml   Last 3 Weights 04/23/2020 04/22/2020 04/21/2020  Weight (lbs) 115 lb 4.8 oz 110 lb 3.7 oz 107 lb 9.4 oz  Weight (kg) 52.3 kg 50 kg 48.8 kg      Telemetry    Atrial fib with variable rates 80-110 - Personally Reviewed  Physical Exam   GEN: No acute distress.   Neck: No JVD Cardiac: iRRR, no murmurs, rubs, or gallops.  Respiratory: diffuse crackles GI: Soft, nontender, non-distended  MS: No edema; No  deformity. Neuro:  Nonfocal  Psych: Normal affect    Labs    High Sensitivity Troponin:  No results for input(s): TROPONINIHS in the last 720 hours.    Chemistry Recent Labs  Lab 04/21/20 0524 04/22/20 0446 04/23/20 0634  NA 139 139 141  K 3.6 3.3* 4.3  CL 95* 92* 97*  CO2 35* 38* 35*  GLUCOSE 94 98 95  BUN 19 18 19   CREATININE 1.13* 1.12* 1.15*  CALCIUM 8.7* 8.7* 9.3  PROT 6.7 6.8 6.6  ALBUMIN 2.2* 2.3* 2.4*  AST 35 39 50*  ALT 24 27 36  ALKPHOS 47 47 46  BILITOT 0.7 0.8 0.4  GFRNONAA 44* 44* 43*  GFRAA 51* 51* 50*  ANIONGAP 9 9 9      Hematology Recent Labs  Lab 04/21/20 0524 04/22/20 0446 04/23/20 0634  WBC 7.7 6.6 6.0  RBC 3.75* 3.86* 3.83*  HGB 9.5* 10.0* 9.7*  HCT 30.9* 31.8* 31.7*  MCV 82.4 82.4 82.8  MCH 25.3* 25.9* 25.3*  MCHC 30.7 31.4 30.6  RDW 18.3* 18.3* 18.5*  PLT 237 257 242    BNP No results for input(s): BNP, PROBNP in the last 168 hours.   DDimer No results for input(s): DDIMER in the  last 168 hours.   Radiology    CT Chest High Resolution  Result Date: 04/22/2020 CLINICAL DATA:  Inpatient. Respiratory failure. Persistent oxygen requirement. Abnormal chest radiograph. EXAM: CT CHEST WITHOUT CONTRAST TECHNIQUE: Multidetector CT imaging of the chest was performed following the standard protocol without intravenous contrast. High resolution imaging of the lungs, as well as inspiratory and expiratory imaging, was performed. COMPARISON:  Chest radiograph from earlier today. FINDINGS: Motion degraded scan, limiting assessment. Cardiovascular: Moderate cardiomegaly. No significant pericardial effusion/thickening. Three-vessel coronary atherosclerosis. Atherosclerotic nonaneurysmal thoracic aorta. Dilated main pulmonary artery (3.9 cm diameter). Mediastinum/Nodes: No discrete thyroid nodules. Unremarkable esophagus. No axillary adenopathy. Multiple enlarged right paratracheal nodes up to 1.8 cm short axis diameter (series 3/image 49). No discrete  hilar adenopathy on these noncontrast images. Lungs/Pleura: No pneumothorax. Trace dependent left pleural effusion. No right pleural effusion. Mosaic attenuation throughout both lungs, without convincing air trapping on the limited expiration sequence. Diffuse bronchial wall thickening. Patchy ground-glass opacity and interlobular septal thickening throughout both lungs, most prominent in the lower lobes. Hypoventilatory changes in the dependent lower lobes. No lung masses or significant pulmonary nodules. No frank honeycombing. No significant regions of bronchiectasis. Upper abdomen: Simple 4.4 cm posterior upper left renal cyst. Musculoskeletal: No aggressive appearing focal osseous lesions. Severe cough T3, T7, T8 and T9 vertebral compression fractures, worsened at T8 and otherwise stable since 08/07/2019 radiographs. Moderate thoracic spondylosis. IMPRESSION: 1. Moderate cardiomegaly. Dilated main pulmonary artery, suggesting pulmonary arterial hypertension. Trace dependent left pleural effusion. 2. Mosaic attenuation in both lungs, favoring mosaic perfusion due to pulmonary vascular disease. 3. Nonspecific patchy ground-glass opacity and septal thickening throughout both lungs, most prominent in the lower lobes. Favor a combination of pulmonary edema, nonspecific postinfectious/postinflammatory scarring and atelectasis. An underlying basilar predominant interstitial lung disease is less favored but difficult to entirely exclude. Follow-up high-resolution chest CT could be considered in 6 months as clinically warranted. 4. Nonspecific diffuse bronchial wall thickening, suggesting chronic bronchitis and/or reactive airways disease. 5. Three-vessel coronary atherosclerosis. 6. Nonspecific mild mediastinal lymphadenopathy, potentially reactive. 7. Severe chronic T3, T7, T8 and T9 vertebral compression fractures. 8. Aortic Atherosclerosis (ICD10-I70.0). Electronically Signed   By: Ilona Sorrel M.D.   On: 04/22/2020  17:01   DG CHEST PORT 1 VIEW  Result Date: 04/23/2020 CLINICAL DATA:  Shortness of breath. EXAM: PORTABLE CHEST 1 VIEW COMPARISON:  April 22, 2020. FINDINGS: Stable cardiomediastinal silhouette. No pneumothorax or pleural effusion is noted. Stable diffuse interstitial densities are noted throughout both lungs which may represent scarring, although superimposed inflammation or edema cannot be excluded. Old right rib fractures are noted. IMPRESSION: Stable diffuse interstitial densities are noted throughout both lungs which may represent scarring, although superimposed inflammation or edema cannot be excluded. Aortic Atherosclerosis (ICD10-I70.0). Electronically Signed   By: Marijo Conception M.D.   On: 04/23/2020 08:35   DG CHEST PORT 1 VIEW  Result Date: 04/22/2020 CLINICAL DATA:  Shortness of breath EXAM: PORTABLE CHEST 1 VIEW COMPARISON:  Portable exam 0656 hours compared to 04/21/2020 FINDINGS: Rotated to the RIGHT. Normal heart size, mediastinal contours, and pulmonary vascularity. Atherosclerotic calcification aorta. Chronic increased interstitial markings diffusely throughout both lungs. Persistent atelectasis versus consolidation LEFT lung base. No pleural effusion or pneumothorax. Bones demineralized. IMPRESSION: Chronic interstitial prominence with unchanged atelectasis versus consolidation at LEFT lung base. Electronically Signed   By: Lavonia Dana M.D.   On: 04/22/2020 09:16    Cardiac Studies   2D Echo 04/16/20 1. Left ventricular ejection fraction, by estimation, is  50 to 55%. The  left ventricle has low normal function. The left ventricle has no regional  wall motion abnormalities. There is mild concentric left ventricular  hypertrophy. Left ventricular  diastolic parameters are indeterminate.  2. Right ventricular systolic function is mildly reduced. The right  ventricular size is normal. There is moderately elevated pulmonary artery  systolic pressure.  3. Left atrial size was  severely dilated.  4. Right atrial size was severely dilated.  5. The mitral valve is normal in structure. Mild mitral valve  regurgitation. No evidence of mitral stenosis.  6. Tricuspid valve regurgitation is moderate to severe.  7. The aortic valve is normal in structure. Aortic valve regurgitation is  not visualized. No aortic stenosis is present.  8. The inferior vena cava is normal in size with greater than 50%  respiratory variability, suggesting right atrial pressure of 3 mmHg.   Patient Profile     86 y.o. female atrial fibrillation on chronic a/c with Eliquis (historically managed by PCP), HTN, HLD, CKD stage II and chronic diastolic CHF who presented to [facility] with recent DOE, lower extremity edema, and orthopnea, found to have hypoxia and AF-RVR. She has been treated for both CHF and possible PNA (with leukocytosis). Covid test negative. Other issues include metabolic acidosis, normocytic anemia, AKI on CKD stage III, hypokalemia.  Assessment & Plan    1. Acute hypoxic respiratory failure felt multifactorial related to acute on chronic diastolic CHF and possible pneumonia - 2D echo 04/16/20 showed EF 50-55%, mild LVH, RV function mildly reduced, moderately elevated PASP, severe biatrial enlargement, mild MR, moderate-severe TR - dx of PNA unclear - leukocytosis and elevated procalcitonin noted, so has been treated with antibiotics as well as covered for possible aspiration -CT chest shows combination of pulmonary edema, scarring and atelectasis, consider basilar predominant ILD.  - Continue lasix IV one additional day, consider transition to oral tomorrow.   2. Chronic atrial fibrillation with RVR this admission - home regimen included diltiazem 180mg  daily, digoxin 0.125mg  daily. Here she is on Toprol 75mg  daily diltiazem to 60mg  q6hrs. Rates improved, will consolidate diltiazem tomorrow.  - digoxin has been stopped this admission given elderly/renal insufficiency  - continue  apixaban as tolerated at adjusted dose  3. Moderate pulmonary HTN - felt group 2 related to pulmonary venous HTN from CHF - diuretic plan as above  4. Essential HTN - controlled, managed in context of the above.  5. AKI superimposed on CKD stage II  - peak Cr 1.45, improved to 1.1 range and maintaining stability.  6. Hypokalemia - primary team repleting - Mg OK  7. Anemia - Hgb previously 13 range in 2020, now in the 8-10 range this admission. May be contributing to elevated rates. No bleeding reported. - further per IM - hemoccult stool - needs close OP monitoring by primary care given anticoagulation   For questions or updates, please contact [facility] Please consult [email or web address] for contact info under        Signed, Elouise Munroe, MD  04/23/2020, 10:08 AM

## 2020-04-24 DIAGNOSIS — E872 Acidosis: Secondary | ICD-10-CM | POA: Diagnosis not present

## 2020-04-24 DIAGNOSIS — N1831 Chronic kidney disease, stage 3a: Secondary | ICD-10-CM | POA: Diagnosis not present

## 2020-04-24 DIAGNOSIS — H409 Unspecified glaucoma: Secondary | ICD-10-CM | POA: Diagnosis not present

## 2020-04-24 DIAGNOSIS — I5033 Acute on chronic diastolic (congestive) heart failure: Secondary | ICD-10-CM | POA: Diagnosis not present

## 2020-04-24 DIAGNOSIS — Z86718 Personal history of other venous thrombosis and embolism: Secondary | ICD-10-CM | POA: Diagnosis not present

## 2020-04-24 DIAGNOSIS — J690 Pneumonitis due to inhalation of food and vomit: Secondary | ICD-10-CM

## 2020-04-24 DIAGNOSIS — R609 Edema, unspecified: Secondary | ICD-10-CM | POA: Diagnosis not present

## 2020-04-24 DIAGNOSIS — J849 Interstitial pulmonary disease, unspecified: Secondary | ICD-10-CM | POA: Diagnosis not present

## 2020-04-24 DIAGNOSIS — J9601 Acute respiratory failure with hypoxia: Secondary | ICD-10-CM | POA: Diagnosis not present

## 2020-04-24 DIAGNOSIS — D6869 Other thrombophilia: Secondary | ICD-10-CM | POA: Diagnosis not present

## 2020-04-24 DIAGNOSIS — E785 Hyperlipidemia, unspecified: Secondary | ICD-10-CM | POA: Diagnosis not present

## 2020-04-24 DIAGNOSIS — M2550 Pain in unspecified joint: Secondary | ICD-10-CM | POA: Diagnosis not present

## 2020-04-24 DIAGNOSIS — Z7401 Bed confinement status: Secondary | ICD-10-CM | POA: Diagnosis not present

## 2020-04-24 DIAGNOSIS — Z743 Need for continuous supervision: Secondary | ICD-10-CM | POA: Diagnosis not present

## 2020-04-24 DIAGNOSIS — D6859 Other primary thrombophilia: Secondary | ICD-10-CM | POA: Diagnosis not present

## 2020-04-24 DIAGNOSIS — I272 Pulmonary hypertension, unspecified: Secondary | ICD-10-CM | POA: Diagnosis not present

## 2020-04-24 DIAGNOSIS — I4891 Unspecified atrial fibrillation: Secondary | ICD-10-CM | POA: Diagnosis not present

## 2020-04-24 DIAGNOSIS — R531 Weakness: Secondary | ICD-10-CM | POA: Diagnosis not present

## 2020-04-24 DIAGNOSIS — D649 Anemia, unspecified: Secondary | ICD-10-CM | POA: Diagnosis not present

## 2020-04-24 DIAGNOSIS — R21 Rash and other nonspecific skin eruption: Secondary | ICD-10-CM | POA: Diagnosis not present

## 2020-04-24 DIAGNOSIS — I10 Essential (primary) hypertension: Secondary | ICD-10-CM | POA: Diagnosis not present

## 2020-04-24 DIAGNOSIS — E876 Hypokalemia: Secondary | ICD-10-CM | POA: Diagnosis not present

## 2020-04-24 DIAGNOSIS — I700 Atherosclerosis of aorta: Secondary | ICD-10-CM | POA: Diagnosis not present

## 2020-04-24 DIAGNOSIS — N183 Chronic kidney disease, stage 3 unspecified: Secondary | ICD-10-CM | POA: Diagnosis not present

## 2020-04-24 LAB — CBC
HCT: 32.5 % — ABNORMAL LOW (ref 36.0–46.0)
Hemoglobin: 10.1 g/dL — ABNORMAL LOW (ref 12.0–15.0)
MCH: 25.8 pg — ABNORMAL LOW (ref 26.0–34.0)
MCHC: 31.1 g/dL (ref 30.0–36.0)
MCV: 83.1 fL (ref 80.0–100.0)
Platelets: 223 10*3/uL (ref 150–400)
RBC: 3.91 MIL/uL (ref 3.87–5.11)
RDW: 18.6 % — ABNORMAL HIGH (ref 11.5–15.5)
WBC: 6.9 10*3/uL (ref 4.0–10.5)
nRBC: 0 % (ref 0.0–0.2)

## 2020-04-24 LAB — BASIC METABOLIC PANEL
Anion gap: 11 (ref 5–15)
BUN: 18 mg/dL (ref 8–23)
CO2: 33 mmol/L — ABNORMAL HIGH (ref 22–32)
Calcium: 9.1 mg/dL (ref 8.9–10.3)
Chloride: 96 mmol/L — ABNORMAL LOW (ref 98–111)
Creatinine, Ser: 1.08 mg/dL — ABNORMAL HIGH (ref 0.44–1.00)
GFR calc Af Amer: 53 mL/min — ABNORMAL LOW (ref >60)
GFR calc non Af Amer: 46 mL/min — ABNORMAL LOW (ref >60)
Glucose, Bld: 102 mg/dL — ABNORMAL HIGH (ref 70–99)
Potassium: 3.7 mmol/L (ref 3.5–5.1)
Sodium: 140 mmol/L (ref 135–145)

## 2020-04-24 LAB — CULTURE, BLOOD (ROUTINE X 2)
Culture: NO GROWTH
Culture: NO GROWTH
Special Requests: ADEQUATE
Special Requests: ADEQUATE

## 2020-04-24 MED ORDER — METOPROLOL SUCCINATE ER 25 MG PO TB24: 75.0000 mg | ORAL_TABLET | Freq: Every day | ORAL | 0 refills | Status: DC

## 2020-04-24 MED ORDER — TORSEMIDE 20 MG PO TABS: 20.0000 mg | ORAL_TABLET | Freq: Two times a day (BID) | ORAL | 0 refills | Status: DC

## 2020-04-24 MED ORDER — TORSEMIDE 20 MG PO TABS: 20.0000 mg | ORAL_TABLET | Freq: Two times a day (BID) | ORAL | Status: DC

## 2020-04-24 MED ORDER — DILTIAZEM HCL ER COATED BEADS 240 MG PO CP24: 240.0000 mg | ORAL_CAPSULE | Freq: Every day | ORAL | 0 refills | Status: DC

## 2020-04-24 MED ORDER — POTASSIUM CHLORIDE CRYS ER 20 MEQ PO TBCR
20.0000 meq | EXTENDED_RELEASE_TABLET | Freq: Every day | ORAL | Status: DC
  Administered 2020-04-24: 20 meq via ORAL
  Filled 2020-04-24: qty 1

## 2020-04-24 MED ORDER — POTASSIUM CHLORIDE ER 10 MEQ PO TBCR: 10.0000 meq | EXTENDED_RELEASE_TABLET | Freq: Every day | ORAL | 0 refills | Status: DC

## 2020-04-24 NOTE — Progress Notes (Signed)
Physical Therapy Treatment Patient Details Name: Kelli Peterson MRN: [id number] DOB: 1930-07-30 Today's Date: 04/24/2020    History of Present Illness Pt is an 86 y/o female admitted secondary to palpitations and SOB x3 weeks. Pt found to have acute respiratory failure with hypoxemia and a-fib with RVR. PMH including but not limited to HTN, CHF, a-fib and cervical cancer.    PT Comments    Pt just finished breakfast and talking with daughter on entry. Pt agreeable to therapy however somewhat impulsive during session in attempt to get it over quickly. Pt much more participatory today and is able to come to sitting with legs over EOB without assist, however requires assist to get LE to floor. Pt is min A for hand hold face to face transfer to recliner. Attempted ambulation with RW and initially min guard however when she experienced L knee buckling she became fearful trying to push herself back to chair ultimately requiring modA for safe return to recliner. Educated pt on need for LE strengthening and led pt in marching in place. Pt with continued L knee buckling with marching. Will continue to work with pt on LE strengthening to improve safe ambulation. D/c plans remain appropriate at this time. PT will continue to follow acutely.   Follow Up Recommendations  SNF     Equipment Recommendations  None recommended by PT    Recommendations for Other Services       Precautions / Restrictions Precautions Precautions: Fall Precaution Comments: monitor SpO2 watch HR (intermittent tachy in session) Restrictions Weight Bearing Restrictions: No    Mobility  Bed Mobility Overal bed mobility: Needs Assistance Bed Mobility: Supine to Sit     Supine to sit: Min assist     General bed mobility comments: pt able to get LE to edge of bed and trunk to upright, requires min A to pull against therapist to get hips to EoB   Transfers Overall transfer level: Needs assistance Equipment used: 1  person hand held assist;Rolling walker (2 wheeled) Transfers: Stand Pivot Transfers Sit to Stand: Min assist         General transfer comment: min A for steadying with pivot transfer to recliner  Ambulation/Gait Ambulation/Gait assistance: Mod assist;Min guard Gait Distance (Feet): 3 Feet Assistive device: Rolling walker (2 wheeled) Gait Pattern/deviations: Step-to pattern Gait velocity: slowed Gait velocity interpretation: <1.31 ft/sec, indicative of household ambulator General Gait Details: min guard to take steps away from recliner, experiences L knee buckling and despite therapist support pt with increased retropulsion to get back to recliner requiring modA for safely landing in recliner, once in recliner discussed need to communicate with therapist about intentions and to allow assistance for safe mobility with LoB    Stairs             Wheelchair Mobility    Modified Rankin (Stroke Patients Only)       Balance Overall balance assessment: Needs assistance Sitting-balance support: Feet supported;Single extremity supported;Bilateral upper extremity supported;No upper extremity supported Sitting balance-Leahy Scale: Fair     Standing balance support: Bilateral upper extremity supported;During functional activity Standing balance-Leahy Scale: Poor Standing balance comment: requires B UE support                            Cognition Arousal/Alertness: Awake/alert Behavior During Therapy: Flat affect;Impulsive Overall Cognitive Status: Impaired/Different from baseline Area of Impairment: Following commands;Problem solving;Safety/judgement;Awareness  Following Commands: Follows one step commands with increased time;Follows multi-step commands with increased time Safety/Judgement: Decreased awareness of deficits;Decreased awareness of safety Awareness: Emergent Problem Solving: Slow processing;Requires verbal cues;Difficulty  sequencing;Requires tactile cues General Comments: slightly impulsive today to finish therapy quickly       Exercises General Exercises - Lower Extremity Hip Flexion/Marching: Strengthening;Standing;Both (x5 with L knee buckling on 6th attempt unable to lift L LE)    General Comments General comments (skin integrity, edema, etc.): Daughter present during session. RN attempting to wean O2 and pt is currently on 1L O2 via [place] at rest able to maintain SaO2 >90%O2 with mobility drops to min 80%s and with seated rest break and purse lip breathing quickly returns to >90%O2, HR during session 87-123bpm in Afib      Pertinent Vitals/Pain Pain Assessment: No/denies pain Faces Pain Scale: No hurt           PT Goals (current goals can now be found in the care plan section) Acute Rehab PT Goals Patient Stated Goal: To sit up PT Goal Formulation: With patient Time For Goal Achievement: 05/01/20 Potential to Achieve Goals: Fair Progress towards PT goals: Progressing toward goals    Frequency    Min 2X/week      PT Plan Current plan remains appropriate;Frequency needs to be updated       AM-PAC PT "6 Clicks" Mobility   Outcome Measure  Help needed turning from your back to your side while in a flat bed without using bedrails?: None Help needed moving from lying on your back to sitting on the side of a flat bed without using bedrails?: A Little Help needed moving to and from a bed to a chair (including a wheelchair)?: A Little Help needed standing up from a chair using your arms (e.g., wheelchair or bedside chair)?: A Little Help needed to walk in hospital room?: Total Help needed climbing 3-5 steps with a railing? : Total 6 Click Score: 15    End of Session Equipment Utilized During Treatment: Oxygen Activity Tolerance: Patient tolerated treatment well Patient left: with call bell/phone within reach;in chair;with family/visitor present Nurse Communication: Mobility status PT  Visit Diagnosis: Other abnormalities of gait and mobility (R26.89);Muscle weakness (generalized) (M62.81)     Time: 6712-4580 PT Time Calculation (min) (ACUTE ONLY): 21 min  Charges:  $Therapeutic Exercise: 8-22 mins                     Adisa Litt B. Migdalia Dk PT, DPT Acute Rehabilitation Services Pager [phone] Office [phone]    [place] 04/24/2020, 10:00 AM

## 2020-04-24 NOTE — Discharge Summary (Signed)
Physician Discharge Summary  Kelli Peterson KZS:[id number] DOB: 1930-05-20 DOA: 04/15/2020  PCP: Jani Gravel, MD  Admit date: 04/15/2020 Discharge date: 04/24/2020  Admitted From: Home Disposition:  Discharged to [facility]  Recommendations for Outpatient Follow-up:  1. Follow up with PCP in 1 weeks 2. Please obtain BMP/CBC in 3 - 5 days to follow up on creatinine and adjust diuretic as necessary    Discharge Condition: Stable  CODE STATUS: FULL   Brief/Interim Summary: Kelli Gilles Pedersonis a 86 y.o.femalewith medical history significant ofA. fib-on Eliquis, hypertension, hyperlipidemia, chronic diastolic CHF presents to emergency department with palpitation and shortness of breath since 3 weeks.  7/22: Continuing to wean O2. Stable otherwise. No on PO torsemide. Appreciate cardiology assistance. Continue 2L [place] and wean as able. She is good to discharge to [facility].   Discharge Diagnoses:  Principal Problem:   Acute hypoxemic respiratory failure (HCC) Active Problems:   Atrial fibrillation with RVR (HCC)   Essential hypertension   CKD (chronic kidney disease), stage III   Pressure injury of skin   Acute on chronic diastolic heart failure (HCC)  Acute Respiratory Failure with Hypoxia secondary to acute on chronic diastolic CHF and Asp PNA     - currently on RA and stable     - Continue to fluid Restrict to 1500 mL     - echo showed a left ventricular ejection fraction 50 to 55% with left ventricular having a low normal function and left ventricular has no regional wall more action abnormalities.  There is mild concentric left ventricular hypertrophy and left ventricular diastolic parameters were indeterminate and patient also had right ventricular systolic function is mildly reduced     - Procalcitonin level was 1.23 and trended down to 0.51; had leukocytosis of 17.6 so antibiotics were resumed as there is concern for aspirating; completed 5 day course of Unasyn     - cardiology ordered  high rates CT scan to rule out ILD: Nonspecific patchy ground-glass opacity and septal thickening throughout both lungs, most prominent in the lower lobes. Favor a combination of pulmonary edema, nonspecific postinfectious/postinflammatory scarring and atelectasis. An underlying basilar predominant interstitial lung disease is less favored but difficult to entirely exclude. Follow-up high-resolution chest CT could be considered in 6 months as clinically warranted.     - SLP has completed MBS; see below     - 7/22: transitioned to Po torsemide; continue at discharge  Dysphagia         SLP Diet Recommendations: Dysphagia 3 (Mech soft) solids;Nectar thick liquid         Liquid Administration via: Cup;No straw        Medication Administration: Whole meds with puree        Supervision: Full supervision/cueing for compensatory strategies;Staff to assist with self feeding         Compensations: Minimize environmental distractions;Slow rate;Small sips/bites;Follow solids with liquid;Chin tuck         Postural Changes: Remain semi-upright after after feeds/meals (Comment);Seated upright at 90 degrees         Oral Care Recommendations: Oral care BID         Other Recommendations: Order thickener from pharmacy;Prohibited food (jello, ice cream, thin soups);Remove water pitcher;Have oral suction available  Persistent A. Fib with RVR     - Recently has been seeing her PCP who have been adjusting medication and she has had prior to admission medications with metoprolol XL 25 mg p.o. daily, diltiazem 180 mg p.o. daily, hydroxyzine 0.125  mg daily and she is currently anticoagulated with low-dose Eliquis     - In the ED she was given IV metoprolol and IV diltiazem.     - per cards: Toprol 75mg  daily diltiazem 240 mg daily   Acquired Thrombophilia     - CHA2DS2-VASc is at least a 5 for Age, Sex, HTN, and CHF Hx     - Continue with low-dose Eliquis 2.5 mg p.o. twice daily given that her age is above 21 and  that her weight is below 60 kg  Hypertension     - Toprol 75mg  daily, diltiazem 240 mg qday  Hyperlipidemia     - Continue Atorvastatin 20 mg po Daily   Pulmonary Hypertension      - Moderate and likely group 2 related to pulmonary versus hypotension from CHF     - She has moderate to severe tricuspid regurg and a right ventricular function is mildly reduced     - 7/22: Now transitioned to torsemide; follow up outpt  Metabolic Acidosis, improved     - The patient's CO2 initially was 21, anion gap was 14, chloride level was 104; her CO2 is now 35, chloride level is 94, anion gap is 9     - resolved  Normocytic Anemia     - no evidence of bleed. Hgb stable, follow  AKI onCKD stage IIIb     - SCr is 1.08 this AM, stable, follow  Hypokalemia     - replaced, follow, K+ 3.7 this AM.     - continue K+ at discharge, 10 mEq qday  Discharge Instructions   Allergies as of 04/24/2020      Reactions   Penicillins Swelling, Other (See Comments)   Ankles and feet swell   Zocor [simvastatin] Other (See Comments)   Weak and dizzy   Nsaids Other (See Comments)   Tylenol is all that is permitted      Medication List    STOP taking these medications   alendronate 70 MG tablet Commonly known as: FOSAMAX   amLODipine 5 MG tablet Commonly known as: NORVASC   Digox 0.25 MG tablet Generic drug: digoxin   diltiazem 180 MG 24 hr capsule Commonly known as: TIAZAC   dorzolamide-timolol 22.3-6.8 MG/ML ophthalmic solution Commonly known as: COSOPT   ferrous sulfate 325 (65 FE) MG tablet   hydrALAZINE 25 MG tablet Commonly known as: APRESOLINE   HYDROcodone-acetaminophen 5-325 MG tablet Commonly known as: NORCO/VICODIN   oxybutynin 5 MG tablet Commonly known as: DITROPAN   potassium chloride SA 20 MEQ tablet Commonly known as: KLOR-CON     TAKE these medications   acetaminophen 500 MG tablet Commonly known as: TYLENOL Take 500 mg by mouth every 6 (six) hours as  needed for pain.   atorvastatin 20 MG tablet Commonly known as: LIPITOR Take 20 mg by mouth daily.   brimonidine 0.2 % ophthalmic solution Commonly known as: ALPHAGAN Place 1 drop into the right eye 2 (two) times daily.   denosumab 60 MG/ML Sosy injection Commonly known as: PROLIA Inject 60 mg into the skin every 6 (six) months.   diltiazem 240 MG 24 hr capsule Commonly known as: CARDIZEM CD Take 1 capsule (240 mg total) by mouth daily. Start taking on: April 25, 2020   dorzolamide 2 % ophthalmic solution Commonly known as: TRUSOPT Place 1 drop into both eyes 2 (two) times daily.   Eliquis 2.5 MG Tabs tablet Generic drug: apixaban Take 2.5 mg by mouth 2 (two)  times daily.   guaiFENesin 600 MG 12 hr tablet Commonly known as: MUCINEX Take 600 mg by mouth 2 (two) times daily as needed for to loosen phlegm.   latanoprost 0.005 % ophthalmic solution Commonly known as: XALATAN Place 1 drop into both eyes daily at 12 noon.   metoprolol succinate 25 MG 24 hr tablet Commonly known as: TOPROL-XL Take 3 tablets (75 mg total) by mouth daily. Start taking on: April 25, 2020 What changed: how much to take   multivitamin with minerals Tabs tablet Take 1 tablet by mouth daily.   potassium chloride 10 MEQ tablet Commonly known as: KLOR-CON Take 1 tablet (10 mEq total) by mouth daily.   Rhopressa 0.02 % Soln Generic drug: Netarsudil Dimesylate Place 1 drop into the right eye daily.   torsemide 20 MG tablet Commonly known as: DEMADEX Take 1 tablet (20 mg total) by mouth 2 (two) times daily.       Contact information for after-discharge care    Destination    [facility] Preferred [facility]F .   Service: Skilled Nursing Contact information: [place] [phone]                 Allergies  Allergen Reactions  . Penicillins Swelling and Other (See Comments)    Ankles and feet swell  . Zocor [Simvastatin]  Other (See Comments)    Weak and dizzy  . Nsaids Other (See Comments)    Tylenol is all that is permitted    Consultations:  Cardiology  Wound Care: Pressure Injury 04/17/20 Buttocks Left Stage 2 -  Partial thickness loss of dermis presenting as a shallow open injury with a red, pink wound bed without slough. (Active)  04/17/20 0132  Location: Buttocks  Location Orientation: Left  Staging: Stage 2 -  Partial thickness loss of dermis presenting as a shallow open injury with a red, pink wound bed without slough.  Wound Description (Comments):   Present on Admission:     Procedures/Studies: CT Chest High Resolution  Result Date: 04/22/2020 CLINICAL DATA:  Inpatient. Respiratory failure. Persistent oxygen requirement. Abnormal chest radiograph. EXAM: CT CHEST WITHOUT CONTRAST TECHNIQUE: Multidetector CT imaging of the chest was performed following the standard protocol without intravenous contrast. High resolution imaging of the lungs, as well as inspiratory and expiratory imaging, was performed. COMPARISON:  Chest radiograph from earlier today. FINDINGS: Motion degraded scan, limiting assessment. Cardiovascular: Moderate cardiomegaly. No significant pericardial effusion/thickening. Three-vessel coronary atherosclerosis. Atherosclerotic nonaneurysmal thoracic aorta. Dilated main pulmonary artery (3.9 cm diameter). Mediastinum/Nodes: No discrete thyroid nodules. Unremarkable esophagus. No axillary adenopathy. Multiple enlarged right paratracheal nodes up to 1.8 cm short axis diameter (series 3/image 49). No discrete hilar adenopathy on these noncontrast images. Lungs/Pleura: No pneumothorax. Trace dependent left pleural effusion. No right pleural effusion. Mosaic attenuation throughout both lungs, without convincing air trapping on the limited expiration sequence. Diffuse bronchial wall thickening. Patchy ground-glass opacity and interlobular septal thickening throughout both lungs, most prominent  in the lower lobes. Hypoventilatory changes in the dependent lower lobes. No lung masses or significant pulmonary nodules. No frank honeycombing. No significant regions of bronchiectasis. Upper abdomen: Simple 4.4 cm posterior upper left renal cyst. Musculoskeletal: No aggressive appearing focal osseous lesions. Severe cough T3, T7, T8 and T9 vertebral compression fractures, worsened at T8 and otherwise stable since 08/07/2019 radiographs. Moderate thoracic spondylosis. IMPRESSION: 1. Moderate cardiomegaly. Dilated main pulmonary artery, suggesting pulmonary arterial hypertension. Trace dependent left pleural effusion. 2. Mosaic attenuation in  both lungs, favoring mosaic perfusion due to pulmonary vascular disease. 3. Nonspecific patchy ground-glass opacity and septal thickening throughout both lungs, most prominent in the lower lobes. Favor a combination of pulmonary edema, nonspecific postinfectious/postinflammatory scarring and atelectasis. An underlying basilar predominant interstitial lung disease is less favored but difficult to entirely exclude. Follow-up high-resolution chest CT could be considered in 6 months as clinically warranted. 4. Nonspecific diffuse bronchial wall thickening, suggesting chronic bronchitis and/or reactive airways disease. 5. Three-vessel coronary atherosclerosis. 6. Nonspecific mild mediastinal lymphadenopathy, potentially reactive. 7. Severe chronic T3, T7, T8 and T9 vertebral compression fractures. 8. Aortic Atherosclerosis (ICD10-I70.0). Electronically Signed   By: Ilona Sorrel M.D.   On: 04/22/2020 17:01   DG CHEST PORT 1 VIEW  Result Date: 04/23/2020 CLINICAL DATA:  Shortness of breath. EXAM: PORTABLE CHEST 1 VIEW COMPARISON:  April 22, 2020. FINDINGS: Stable cardiomediastinal silhouette. No pneumothorax or pleural effusion is noted. Stable diffuse interstitial densities are noted throughout both lungs which may represent scarring, although superimposed inflammation or edema  cannot be excluded. Old right rib fractures are noted. IMPRESSION: Stable diffuse interstitial densities are noted throughout both lungs which may represent scarring, although superimposed inflammation or edema cannot be excluded. Aortic Atherosclerosis (ICD10-I70.0). Electronically Signed   By: Marijo Conception M.D.   On: 04/23/2020 08:35   DG CHEST PORT 1 VIEW  Result Date: 04/22/2020 CLINICAL DATA:  Shortness of breath EXAM: PORTABLE CHEST 1 VIEW COMPARISON:  Portable exam 0656 hours compared to 04/21/2020 FINDINGS: Rotated to the RIGHT. Normal heart size, mediastinal contours, and pulmonary vascularity. Atherosclerotic calcification aorta. Chronic increased interstitial markings diffusely throughout both lungs. Persistent atelectasis versus consolidation LEFT lung base. No pleural effusion or pneumothorax. Bones demineralized. IMPRESSION: Chronic interstitial prominence with unchanged atelectasis versus consolidation at LEFT lung base. Electronically Signed   By: Lavonia Dana M.D.   On: 04/22/2020 09:16   DG CHEST PORT 1 VIEW  Result Date: 04/21/2020 CLINICAL DATA:  Shortness of breath EXAM: PORTABLE CHEST 1 VIEW COMPARISON:  04/20/2020 FINDINGS: Increased interstitial prominence and patchy density at the left lung base is without substantial change. No significant pleural effusion. No pneumothorax. Stable cardiomediastinal contours. IMPRESSION: No substantial change since 04/20/2020 with persistent increased interstitial prominence and patchy density at the left lung base. Electronically Signed   By: Macy Mis M.D.   On: 04/21/2020 08:20   DG CHEST PORT 1 VIEW  Result Date: 04/20/2020 CLINICAL DATA:  Evaluate Stable cardiomegaly. Stable interstitial densities are noted throughout both lungs which may represent edema or possibly atypical inflammation EXAM: PORTABLE CHEST 1 VIEW COMPARISON:  Chest radiograph 04/19/2020 FINDINGS: Stable cardiomediastinal contours. Aortic atherosclerotic  calcification. Persistent diffuse bilateral interstitial opacities. Small bandlike opacity at the left base likely atelectasis. No pneumothorax or large pleural effusion. No acute finding in the visualized skeleton. Old right rib fracture. IMPRESSION: Unchanged diffuse bilateral interstitial opacities. Probable mild left basilar atelectasis. Electronically Signed   By: Audie Pinto M.D.   On: 04/20/2020 15:43   DG CHEST PORT 1 VIEW  Result Date: 04/19/2020 CLINICAL DATA:  Cardiomegaly. EXAM: PORTABLE CHEST 1 VIEW COMPARISON:  April 17, 2020. FINDINGS: Stable cardiomegaly. No pneumothorax is noted. Stable interstitial densities are noted throughout both lungs which may represent edema or possibly atypical inflammation. Small left pleural effusion is noted. Bony thorax is unremarkable. IMPRESSION: Stable cardiomegaly. Stable interstitial densities are noted throughout both lungs which may represent edema or possibly atypical inflammation. Small left pleural effusion is noted. Aortic Atherosclerosis (ICD10-I70.0). Electronically Signed  By: Marijo Conception M.D.   On: 04/19/2020 12:42   DG CHEST PORT 1 VIEW  Result Date: 04/17/2020 CLINICAL DATA:  Acute hypoxic respiratory failure EXAM: PORTABLE CHEST 1 VIEW COMPARISON:  Two days ago FINDINGS: Interstitial coarsening which is essentially stable from most recent prior and accentuated compared to more remote priors. Cardiomegaly and vascular pedicle widening. Extensive atherosclerotic calcification. Probable small left pleural effusion. No pneumothorax. Midthoracic compression fractures. IMPRESSION: Stable cardiomegaly and interstitial opacity that could be inflammatory or congestive. Electronically Signed   By: Monte Fantasia M.D.   On: 04/17/2020 07:11   DG Chest Port 1 View  Result Date: 04/15/2020 CLINICAL DATA:  Shortness of breath. EXAM: PORTABLE CHEST 1 VIEW COMPARISON:  Prior chest radiograph 03/31/2020 and earlier FINDINGS: Unchanged  cardiomegaly. Aortic atherosclerosis. There are interstitial opacities throughout both lungs, increased in conspicuity as compared to chest radiograph 03/31/2020. Additionally, there is left basilar opacity with silhouetting of the left hemidiaphragm which may reflect atelectasis or consolidation. Additionally, a small pleural effusion may be present. No evidence of pneumothorax. No acute bony abnormality identified. Chronic multilevel thoracic compression deformities. IMPRESSION: Interstitial prominence throughout both lungs, increased as compared to the prior exam of 03/31/2020. Findings are nonspecific, but may reflect interstitial edema or atypical/viral pneumonia. Opacity at the left lung base, consistent with atelectasis or consolidation. Unchanged cardiomegaly. Aortic Atherosclerosis (ICD10-I70.0). Electronically Signed   By: Kellie Simmering DO   On: 04/15/2020 16:15   DG Swallowing Func-Speech Pathology  Result Date: 04/23/2020 Objective Swallowing Evaluation: Type of Study: MBS-Modified Barium Swallow Study  Patient Details Name: AVIV ROTA MRN: [id number] Date of Birth: August 15, 1930 Today's Date: 04/23/2020 Time: SLP Start Time (ACUTE ONLY): 1116 -SLP Stop Time (ACUTE ONLY): 1135 SLP Time Calculation (min) (ACUTE ONLY): 19 min Past Medical History: Past Medical History: Diagnosis Date . Adenomatous colon polyp 2007 . Atrial fibrillation ([place])  . Barrett's esophagus 2007 . 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 Cervical cancer ([place])  . CHF (congestive heart failure) ([place])  . Closed pelvic fracture ([place])  . CVD (cardiovascular disease)  . DVT (deep venous thrombosis) ([place])  . Fibrocystic breast disease  . Fracture of pubic ramus ([place])  . Hyperlipidemia  . Hypertension  . Iron deficiency anemia  Past Surgical History: Past Surgical History: Procedure Laterality Date . CATARACT EXTRACTION Right 02/15/2010  [facility] . CATARACT EXTRACTION Left 01/30/2010  [facility] . CATARACT EXTRACTION, BILATERAL  2010 . TOTAL ABDOMINAL  HYSTERECTOMY  1964 HPI: Pt is an 86 y/o female admitted secondary to palpitations and SOB x3 weeks. Pt found to have acute respiratory failure with hypoxemia and a-fib with RVR. Pt was noted to cough with thin liquids and had an increase in WBC, prompting swallow evaluation. PMH including but not limited to Barrett's esophagus, cerebellar hemorrhage, HTN, CHF, a-fib and cervical cancer. CXR 7/21: Stable diffuse interstitial densities are noted throughout both lungs which may represent scarring, although superimposed inflammation or edema cannot be excluded.  Subjective: says she has trouble swallowing, dtr agrees - has been going on for a while Assessment / Plan / Recommendation CHL IP CLINICAL IMPRESSIONS 04/23/2020 Clinical Impression Pt presents with oropharyngeal dysphagia characterized by reduced bolus cohesion, reduced lingual retraction, reduced pharyngeal constriction, a pharyngeal delay, and reduced anterior laryngeal movement. She demonstrated premature spillage to the valleculae and pyriform sinuses, mild vallecular residue, mild pyriform sinus residue, trace posterior pharyngeal wall residue, silent aspiration (PAS 8) of thin liquids, and penetration (PAS 3,5) as well as inconsistently sensed aspiration (PAS 7,8)  of nectar thick liquids when a chin tuck posture was not utilized. Mild backflow of material was noted from the lower to upper thoracic esophagus but not to the pharynx. A dysphagia 3 diet with nectar thick liquids is recommended at this time with strict observance of swallowing precautions including consistent use of chin tuck posture. SLP will follow for dysphagia treatment.  SLP Visit Diagnosis Dysphagia, oropharyngeal phase (R13.12) Attention and concentration deficit following -- Frontal lobe and executive function deficit following -- Impact on safety and function Moderate aspiration risk;Mild aspiration risk   CHL IP TREATMENT RECOMMENDATION 04/23/2020 Treatment Recommendations Therapy as  outlined in treatment plan below   Prognosis 04/23/2020 Prognosis for Safe Diet Advancement Fair Barriers to Reach Goals Time post onset;Severity of deficits Barriers/Prognosis Comment -- CHL IP DIET RECOMMENDATION 04/23/2020 SLP Diet Recommendations Dysphagia 3 (Mech soft) solids;Nectar thick liquid Liquid Administration via Cup;No straw Medication Administration Whole meds with puree Compensations Minimize environmental distractions;Slow rate;Small sips/bites;Follow solids with liquid;Chin tuck Postural Changes Remain semi-upright after after feeds/meals (Comment);Seated upright at 90 degrees   CHL IP OTHER RECOMMENDATIONS 04/23/2020 Recommended Consults -- Oral Care Recommendations Oral care BID Other Recommendations Order thickener from pharmacy;Prohibited food (jello, ice cream, thin soups);Remove water pitcher;Have oral suction available   CHL IP FOLLOW UP RECOMMENDATIONS 04/23/2020 Follow up Recommendations Skilled Nursing facility   [facility] IP FREQUENCY AND DURATION 04/23/2020 Speech Therapy Frequency (ACUTE ONLY) min 2x/week Treatment Duration 2 weeks      CHL IP ORAL PHASE 04/23/2020 Oral Phase Impaired Oral - Pudding Teaspoon -- Oral - Pudding Cup -- Oral - Honey Teaspoon -- Oral - Honey Cup Premature spillage;Decreased bolus cohesion Oral - Nectar Teaspoon -- Oral - Nectar Cup Premature spillage;Decreased bolus cohesion Oral - Nectar Straw Premature spillage;Decreased bolus cohesion Oral - Thin Teaspoon -- Oral - Thin Cup Premature spillage;Decreased bolus cohesion Oral - Thin Straw -- Oral - Puree Premature spillage;Decreased bolus cohesion Oral - Mech Soft WFL Oral - Regular Impaired mastication Oral - Multi-Consistency -- Oral - Pill -- Oral Phase - Comment --  CHL IP PHARYNGEAL PHASE 04/23/2020 Pharyngeal Phase Impaired Pharyngeal- Pudding Teaspoon -- Pharyngeal -- Pharyngeal- Pudding Cup -- Pharyngeal -- Pharyngeal- Honey Teaspoon -- Pharyngeal -- Pharyngeal- Honey Cup Delayed swallow  initiation-vallecula;Reduced anterior laryngeal mobility;Pharyngeal residue - valleculae;Reduced tongue base retraction;Pharyngeal residue - posterior pharnyx Pharyngeal -- Pharyngeal- Nectar Teaspoon -- Pharyngeal -- Pharyngeal- Nectar Cup Delayed swallow initiation-vallecula;Reduced anterior laryngeal mobility;Pharyngeal residue - valleculae;Reduced tongue base retraction;Pharyngeal residue - posterior pharnyx;Delayed swallow initiation-pyriform sinuses;Penetration/Aspiration before swallow;Penetration/Aspiration during swallow Pharyngeal Material enters airway, remains ABOVE vocal cords and not ejected out;Material enters airway, CONTACTS cords and not ejected out;Material enters airway, passes BELOW cords without attempt by patient to eject out (silent aspiration) Pharyngeal- Nectar Straw Delayed swallow initiation-vallecula;Reduced anterior laryngeal mobility;Pharyngeal residue - valleculae;Reduced tongue base retraction;Pharyngeal residue - posterior pharnyx;Penetration/Aspiration before swallow;Penetration/Aspiration during swallow Pharyngeal Material enters airway, passes BELOW cords and not ejected out despite cough attempt by patient;Material enters airway, CONTACTS cords and not ejected out Pharyngeal- Thin Teaspoon -- Pharyngeal -- Pharyngeal- Thin Cup Delayed swallow initiation-vallecula;Reduced anterior laryngeal mobility;Pharyngeal residue - valleculae;Reduced tongue base retraction;Pharyngeal residue - posterior pharnyx;Penetration/Aspiration before swallow;Penetration/Aspiration during swallow Pharyngeal Material enters airway, passes BELOW cords without attempt by patient to eject out (silent aspiration) Pharyngeal- Thin Straw -- Pharyngeal -- Pharyngeal- Puree Reduced anterior laryngeal mobility;Pharyngeal residue - valleculae;Reduced tongue base retraction;Pharyngeal residue - posterior pharnyx Pharyngeal -- Pharyngeal- Mechanical Soft Reduced anterior laryngeal mobility;Pharyngeal residue -  valleculae;Reduced tongue base retraction;Pharyngeal residue - posterior  pharnyx Pharyngeal -- Pharyngeal- Regular Reduced anterior laryngeal mobility;Pharyngeal residue - valleculae;Reduced tongue base retraction;Pharyngeal residue - posterior pharnyx Pharyngeal -- Pharyngeal- Multi-consistency -- Pharyngeal -- Pharyngeal- Pill Reduced anterior laryngeal mobility;Pharyngeal residue - valleculae;Reduced tongue base retraction;Pharyngeal residue - posterior pharnyx Pharyngeal -- Pharyngeal Comment --  CHL IP CERVICAL ESOPHAGEAL PHASE 04/23/2020 Cervical Esophageal Phase See Impressions  Pudding Teaspoon -- Pudding Cup -- Honey Teaspoon -- Honey Cup -- Nectar Teaspoon -- Nectar Cup -- Nectar Straw -- Thin Teaspoon -- Thin Cup -- Thin Straw -- Puree -- Mechanical Soft -- Regular -- Multi-consistency -- Pill -- Cervical Esophageal Comment -- Shanika I. Hardin Negus, [place], [place] Office number [phone] Pager [phone] Horton Marshall 04/23/2020, 2:06 PM              ECHOCARDIOGRAM COMPLETE  Result Date: 04/16/2020    ECHOCARDIOGRAM REPORT   Patient Name:   LAJUANA PATCHELL Date of Exam: 04/16/2020 Medical Rec #:  [id number]        Height:       60.0 in Accession #:    [id number]       Weight:       112.4 lb Date of Birth:  1930-08-16         BSA:          1.461 m Patient Age:    52 years         BP:           105/73 mmHg Patient Gender: F                HR:           93 bpm. Exam Location:  Inpatient Procedure: 2D Echo, Cardiac Doppler and Color Doppler Indications:    Atrial fibrillation  History:        Patient has no prior history of Echocardiogram examinations.                 CHF, COPD; Risk Factors:Hypertension and Dyslipidemia. CKD.  Sonographer:    Clayton Lefort RDCS (AE) Referring Phys: [id number] Michell Heinrich [facility] IMPRESSIONS  1. Left ventricular ejection fraction, by estimation, is 50 to 55%. The left ventricle has low normal function. The left ventricle has no regional wall motion  abnormalities. There is mild concentric left ventricular hypertrophy. Left ventricular diastolic parameters are indeterminate.  2. Right ventricular systolic function is mildly reduced. The right ventricular size is normal. There is moderately elevated pulmonary artery systolic pressure.  3. Left atrial size was severely dilated.  4. Right atrial size was severely dilated.  5. The mitral valve is normal in structure. Mild mitral valve regurgitation. No evidence of mitral stenosis.  6. Tricuspid valve regurgitation is moderate to severe.  7. The aortic valve is normal in structure. Aortic valve regurgitation is not visualized. No aortic stenosis is present.  8. The inferior vena cava is normal in size with greater than 50% respiratory variability, suggesting right atrial pressure of 3 mmHg. FINDINGS  Left Ventricle: Left ventricular ejection fraction, by estimation, is 50 to 55%. The left ventricle has low normal function. The left ventricle has no regional wall motion abnormalities. The left ventricular internal cavity size was normal in size. There is mild concentric left ventricular hypertrophy. Left ventricular diastolic parameters are indeterminate. Right Ventricle: The right ventricular size is normal. No increase in right ventricular wall thickness. Right ventricular systolic function is mildly reduced. There is moderately elevated pulmonary artery systolic pressure. The tricuspid regurgitant velocity is  3.30 m/s, and with an assumed right atrial pressure of 3 mmHg, the estimated right ventricular systolic pressure is 61.4 mmHg. Left Atrium: Left atrial size was severely dilated. Right Atrium: Right atrial size was severely dilated. Pericardium: There is no evidence of pericardial effusion. Mitral Valve: The mitral valve is normal in structure. Normal mobility of the mitral valve leaflets. Mild mitral annular calcification. Mild mitral valve regurgitation. No evidence of mitral valve stenosis. Tricuspid Valve:  The tricuspid valve is normal in structure. Tricuspid valve regurgitation is moderate to severe. No evidence of tricuspid stenosis. Aortic Valve: The aortic valve is normal in structure.. There is moderate thickening and moderate calcification of the aortic valve. Aortic valve regurgitation is not visualized. No aortic stenosis is present. There is moderate thickening of the aortic valve. There is moderate calcification of the aortic valve. Aortic valve mean gradient measures 4.0 mmHg. Aortic valve peak gradient measures 7.2 mmHg. Aortic valve area, by VTI measures 1.20 cm. Pulmonic Valve: The pulmonic valve was normal in structure. Pulmonic valve regurgitation is not visualized. No evidence of pulmonic stenosis. Aorta: The aortic root is normal in size and structure. Venous: The inferior vena cava is normal in size with greater than 50% respiratory variability, suggesting right atrial pressure of 3 mmHg. IAS/Shunts: No atrial level shunt detected by color flow Doppler.  LEFT VENTRICLE PLAX 2D LVIDd:         3.80 cm LVIDs:         2.80 cm LV PW:         1.20 cm LV IVS:        1.37 cm LVOT diam:     1.80 cm LV SV:         32 LV SV Index:   22 LVOT Area:     2.54 cm  RIGHT VENTRICLE            IVC RV Basal diam:  3.60 cm    IVC diam: 1.00 cm RV Mid diam:    2.90 cm RV S prime:     5.43 cm/s TAPSE (M-mode): 0.9 cm LEFT ATRIUM             Index       RIGHT ATRIUM           Index LA diam:        3.80 cm 2.60 cm/m  RA Area:     24.10 cm LA Vol (A2C):   62.6 ml 42.84 ml/m RA Volume:   67.90 ml  46.46 ml/m LA Vol (A4C):   50.0 ml 34.22 ml/m LA Biplane Vol: 57.6 ml 39.42 ml/m  AORTIC VALVE AV Area (Vmax):    1.43 cm AV Area (Vmean):   1.22 cm AV Area (VTI):     1.20 cm AV Vmax:           134.00 cm/s AV Vmean:          97.300 cm/s AV VTI:            0.269 m AV Peak Grad:      7.2 mmHg AV Mean Grad:      4.0 mmHg LVOT Vmax:         75.10 cm/s LVOT Vmean:        46.500 cm/s LVOT VTI:          0.127 m LVOT/AV VTI  ratio: 0.47  AORTA Ao Root diam: 3.10 cm Ao Asc diam:  3.00 cm TRICUSPID VALVE TR Peak grad:  43.6 mmHg TR Vmax:        330.00 cm/s  SHUNTS Systemic VTI:  0.13 m Systemic Diam: 1.80 cm Skeet Latch MD Electronically signed by Skeet Latch MD Signature Date/Time: 04/16/2020/1:58:37 PM    Final    Intravitreal Injection, Pharmacologic Agent - OS - Left Eye  Result Date: 04/08/2020 Time Out 04/08/2020. 9:52 AM. Confirmed correct patient, procedure, site, and patient consented. Anesthesia Topical anesthesia was used. Anesthetic medications included Akten 3.5%. Procedure Preparation included Tobramycin 0.3%. A 30 gauge needle was used. Injection: 1.25 mg Bevacizumab (AVASTIN) SOLN   NDC: [address]-0   Route: Intravitreal, Site: Left Eye, Waste: 0 mg Post-op Post injection exam found visual acuity of at least counting fingers. The patient tolerated the procedure well. There were no complications. The patient received written and verbal post procedure care education. Post injection medications were not given.   OCT, Retina - OU - Both Eyes  Result Date: 04/08/2020 Right Eye Quality was borderline. Scan locations included subfoveal. Progression has been stable. Left Eye Quality was poor. Progression has been stable. Notes Overall no CME residual in the left eye at this time at 6-week interval on intravitreal Avastin for \ retinal vein occlusion    Subjective: "My daughter said I had to."  Discharge Exam: Vitals:   04/24/20 0753 04/24/20 1126  BP: 138/90 117/85  Pulse: 100 45  Resp: 20 20  Temp: 99 F (37.2 C) 98.6 F (37 C)  SpO2: 93% 97%   Vitals:   04/24/20 0001 04/24/20 0607 04/24/20 0753 04/24/20 1126  BP: 112/69 (!) 151/78 138/90 117/85  Pulse: 95 83 100 45  Resp: 20 17 20 20   Temp: 98 F (36.7 C) 97.7 F (36.5 C) 99 F (37.2 C) 98.6 F (37 C)  TempSrc: Oral Axillary Oral Oral  SpO2: 92% 97% 93% 97%  Weight:  51.1 kg    Height:       General: 86 y.o. female resting in bed  in NAD Cardiovascular: RRR, +S1, S2, no m/g/r, equal pulses throughout Respiratory: CTABL, no w/r/r, normal WOB GI: BS+, NDNT, no masses noted, no organomegaly noted MSK: No e/c/c Neuro: Alert to name, follows commands Psyc: calm/cooperative    The results of significant diagnostics from this hospitalization (including imaging, microbiology, ancillary and laboratory) are listed below for reference.     Microbiology: Recent Results (from the past 240 hour(s))  Blood culture (routine x 2)     Status: None   Collection Time: 04/15/20  4:25 PM   Specimen: BLOOD RIGHT HAND  Result Value Ref Range Status   Specimen Description BLOOD RIGHT HAND  Final   Special Requests   Final    BOTTLES DRAWN AEROBIC AND ANAEROBIC Blood Culture results may not be optimal due to an inadequate volume of blood received in culture bottles   Culture   Final    NO GROWTH 5 DAYS Performed at [facility], [place] [address], [place], [place] [address]    Report Status 04/20/2020 FINAL  Final  Blood culture (routine x 2)     Status: None   Collection Time: 04/15/20  4:40 PM   Specimen: BLOOD RIGHT ARM  Result Value Ref Range Status   Specimen Description BLOOD RIGHT ARM  Final   Special Requests   Final    BOTTLES DRAWN AEROBIC AND ANAEROBIC Blood Culture results may not be optimal due to an excessive volume of blood received in culture bottles   Culture   Final  NO GROWTH 5 DAYS Performed at [facility], [place] [address], [place], [place] [address]    Report Status 04/20/2020 FINAL  Final  SARS Coronavirus 2 by RT PCR (hospital order, performed in [facility] hospital lab) Nasopharyngeal Nasopharyngeal Swab     Status: None   Collection Time: 04/15/20  9:52 PM   Specimen: Nasopharyngeal Swab  Result Value Ref Range Status   SARS Coronavirus 2 NEGATIVE NEGATIVE Final    Comment: (NOTE) SARS-CoV-2 target nucleic acids are NOT DETECTED.  The SARS-CoV-2 RNA is generally detectable in upper and  lower respiratory specimens during the acute phase of infection. The lowest concentration of SARS-CoV-2 viral copies this assay can detect is 250 copies / mL. A negative result does not preclude SARS-CoV-2 infection and should not be used as the sole basis for treatment or other patient management decisions.  A negative result may occur with improper specimen collection / handling, submission of specimen other than nasopharyngeal swab, presence of viral mutation(s) within the areas targeted by this assay, and inadequate number of viral copies (<250 copies / mL). A negative result must be combined with clinical observations, patient history, and epidemiological information.  Fact Sheet for Patients:   StrictlyIdeas.no  Fact Sheet for Healthcare Providers: BankingDealers.co.za  This test is not yet approved or  cleared by the [place] FDA and has been authorized for detection and/or diagnosis of SARS-CoV-2 by FDA under an Emergency Use Authorization (EUA).  This EUA will remain in effect (meaning this test can be used) for the duration of the COVID-19 declaration under Section 564(b)(1) of the Act, 21 U.S.C. section 360bbb-3(b)(1), unless the authorization is terminated or revoked sooner.  Performed at [facility], [place] [address] [place], [place] [address]   Culture, blood (routine x 2)     Status: None (Preliminary result)   Collection Time: 04/19/20  9:54 AM   Specimen: BLOOD RIGHT HAND  Result Value Ref Range Status   Specimen Description BLOOD RIGHT HAND  Final   Special Requests   Final    BOTTLES DRAWN AEROBIC ONLY Blood Culture adequate volume   Culture   Final    NO GROWTH 4 DAYS Performed at [facility], [place] [address] [place], [place] [address]    Report Status PENDING  Incomplete  Culture, blood (routine x 2)     Status: None (Preliminary result)   Collection Time: 04/19/20  9:58 AM   Specimen:  BLOOD LEFT HAND  Result Value Ref Range Status   Specimen Description BLOOD LEFT HAND  Final   Special Requests   Final    BOTTLES DRAWN AEROBIC ONLY Blood Culture adequate volume   Culture   Final    NO GROWTH 4 DAYS Performed at [facility], [place] [address] [place], [place] [address]    Report Status PENDING  Incomplete  Culture, Urine     Status: None   Collection Time: 04/19/20  1:29 PM   Specimen: Urine, Random  Result Value Ref Range Status   Specimen Description URINE, RANDOM  Final   Special Requests NONE  Final   Culture   Final    NO GROWTH Performed at [facility], [place] [address] [place], [place] [address]    Report Status 04/20/2020 FINAL  Final  SARS CORONAVIRUS 2 (TAT 6-24 HRS) Nasopharyngeal Nasopharyngeal Swab     Status: None   Collection Time: 04/23/20  1:19 PM   Specimen: Nasopharyngeal Swab  Result Value Ref Range Status   SARS Coronavirus 2 NEGATIVE NEGATIVE Final    Comment: (NOTE) SARS-CoV-2 target nucleic acids are NOT DETECTED.  The SARS-CoV-2 RNA is generally detectable in upper and lower respiratory specimens during the acute phase of infection. Negative results do not preclude SARS-CoV-2 infection, do not rule out co-infections with other pathogens, and should not be used as the sole basis for treatment or other patient management decisions. Negative results must be combined with clinical observations, patient history, and epidemiological information. The expected result is Negative.  Fact Sheet for Patients: SugarRoll.be  Fact Sheet for Healthcare Providers: https://www.woods-mathews.com/  This test is not yet approved or cleared by the [place] FDA and  has been authorized for detection and/or diagnosis of SARS-CoV-2 by FDA under an Emergency Use Authorization (EUA). This EUA will remain  in effect (meaning this test can be used) for the duration of the COVID-19 declaration under  Se ction 564(b)(1) of the Act, 21 U.S.C. section 360bbb-3(b)(1), unless the authorization is terminated or revoked sooner.  Performed at [facility], [place] [address], [place], [place] [address]      Labs: BNP (last 3 results) Recent Labs    04/15/20 1541  BNP 563.1*   Basic Metabolic Panel: Recent Labs  Lab 04/19/20 0658 04/19/20 0658 04/20/20 0548 04/21/20 0524 04/22/20 0446 04/23/20 0634 04/24/20 0424  NA 137   < > 138 139 139 141 140  K 3.2*   < > 3.9 3.6 3.3* 4.3 3.7  CL 95*   < > 96* 95* 92* 97* 96*  CO2 31   < > 33* 35* 38* 35* 33*  GLUCOSE 107*   < > 94 94 98 95 102*  BUN 24*   < > 22 19 18 19 18   CREATININE 1.23*   < > 1.21* 1.13* 1.12* 1.15* 1.08*  CALCIUM 8.7*   < > 8.5* 8.7* 8.7* 9.3 9.1  MG 1.7  --  2.2 1.9 2.0 2.1  --   PHOS 3.5  --  3.9 3.2 3.9 4.4  --    < > = values in this interval not displayed.   Liver Function Tests: Recent Labs  Lab 04/19/20 0658 04/20/20 0548 04/21/20 0524 04/22/20 0446 04/23/20 0634  AST 24 26 35 39 50*  ALT 16 17 24 27  36  ALKPHOS 52 52 47 47 46  BILITOT 0.8 0.7 0.7 0.8 0.4  PROT 6.3* 6.2* 6.7 6.8 6.6  ALBUMIN 2.3* 2.1* 2.2* 2.3* 2.4*   No results for input(s): LIPASE, AMYLASE in the last 168 hours. No results for input(s): AMMONIA in the last 168 hours. CBC: Recent Labs  Lab 04/19/20 0658 04/19/20 0658 04/20/20 0548 04/21/20 0524 04/22/20 0446 04/23/20 0634 04/24/20 0424  WBC 17.6*   < > 8.9 7.7 6.6 6.0 6.9  NEUTROABS 15.8*  --  6.9 5.7 4.6 3.9  --   HGB 9.6*   < > 8.9* 9.5* 10.0* 9.7* 10.1*  HCT 29.8*   < > 28.9* 30.9* 31.8* 31.7* 32.5*  MCV 81.0   < > 82.3 82.4 82.4 82.8 83.1  PLT 269   < > 225 237 257 242 223   < > = values in this interval not displayed.   Cardiac Enzymes: No results for input(s): CKTOTAL, CKMB, CKMBINDEX, TROPONINI in the last 168 hours. BNP: Invalid input(s): POCBNP CBG: No results for input(s): GLUCAP in the last 168 hours. D-Dimer No results for input(s): DDIMER in  the last 72 hours.  Hgb A1c No results for input(s): HGBA1C in the last 72 hours. Lipid Profile No results for input(s): CHOL, HDL, LDLCALC, TRIG, CHOLHDL, LDLDIRECT in the last 72 hours. Thyroid function studies No results for input(s): TSH, T4TOTAL, T3FREE, THYROIDAB in the last 72 hours.  Invalid input(s): FREET3 Anemia work up No results for input(s): VITAMINB12, FOLATE, FERRITIN, TIBC, IRON, RETICCTPCT in the last 72 hours. Urinalysis    Component Value Date/Time   COLORURINE YELLOW 04/19/2020 1339   APPEARANCEUR CLEAR 04/19/2020 1339   LABSPEC 1.008 04/19/2020 1339   PHURINE 5.0 04/19/2020 1339   GLUCOSEU NEGATIVE 04/19/2020 1339   HGBUR NEGATIVE 04/19/2020 1339   BILIRUBINUR NEGATIVE 04/19/2020 1339   KETONESUR NEGATIVE 04/19/2020 1339   PROTEINUR NEGATIVE 04/19/2020 1339   NITRITE NEGATIVE 04/19/2020 1339   LEUKOCYTESUR NEGATIVE 04/19/2020 1339   Sepsis Labs Invalid input(s): PROCALCITONIN,  WBC,  LACTICIDVEN Microbiology Recent Results (from the past 240 hour(s))  Blood culture (routine x 2)     Status: None   Collection Time: 04/15/20  4:25 PM   Specimen: BLOOD RIGHT HAND  Result Value Ref Range Status   Specimen Description BLOOD RIGHT HAND  Final   Special Requests   Final    BOTTLES DRAWN AEROBIC AND ANAEROBIC Blood Culture results may not be optimal due to an inadequate volume of blood received in culture bottles   Culture   Final    NO GROWTH 5 DAYS Performed at [facility], [place] [address] [place], [place] [address]    Report Status 04/20/2020 FINAL  Final  Blood culture (routine x 2)     Status: None   Collection Time: 04/15/20  4:40 PM   Specimen: BLOOD RIGHT ARM  Result Value Ref Range Status   Specimen Description BLOOD RIGHT ARM  Final   Special Requests   Final    BOTTLES DRAWN AEROBIC AND ANAEROBIC Blood Culture results may not be optimal due to an excessive volume of blood received in culture bottles   Culture   Final    NO GROWTH 5  DAYS Performed at [facility], [place] [address] [place], [place] [address]    Report Status 04/20/2020 FINAL  Final  SARS Coronavirus 2 by RT PCR (hospital order, performed in [facility] hospital lab) Nasopharyngeal Nasopharyngeal Swab     Status: None   Collection Time: 04/15/20  9:52 PM   Specimen: Nasopharyngeal Swab  Result Value Ref Range Status   SARS Coronavirus 2 NEGATIVE NEGATIVE Final    Comment: (NOTE) SARS-CoV-2 target nucleic acids are NOT DETECTED.  The SARS-CoV-2 RNA is generally detectable in upper and lower respiratory specimens during the acute phase of infection. The lowest concentration of SARS-CoV-2 viral copies this assay can detect is 250 copies / mL. A negative result does not preclude SARS-CoV-2 infection and should not be used as the sole basis for treatment or other patient management decisions.  A negative result may occur with improper specimen collection / handling, submission of specimen other than nasopharyngeal swab, presence of viral mutation(s) within the areas targeted by this assay, and inadequate number of viral copies (<250 copies / mL). A negative result must be combined with clinical observations, patient history, and epidemiological information.  Fact Sheet for Patients:   StrictlyIdeas.no  Fact Sheet for Healthcare Providers: BankingDealers.co.za  This test is not yet approved or  cleared by the [place] FDA and has been authorized for detection and/or diagnosis of SARS-CoV-2 by FDA under an Emergency Use  Authorization (EUA).  This EUA will remain in effect (meaning this test can be used) for the duration of the COVID-19 declaration under Section 564(b)(1) of the Act, 21 U.S.C. section 360bbb-3(b)(1), unless the authorization is terminated or revoked sooner.  Performed at [facility], [place] [address] [place], [place] [address]   Culture, blood (routine x 2)     Status:  None (Preliminary result)   Collection Time: 04/19/20  9:54 AM   Specimen: BLOOD RIGHT HAND  Result Value Ref Range Status   Specimen Description BLOOD RIGHT HAND  Final   Special Requests   Final    BOTTLES DRAWN AEROBIC ONLY Blood Culture adequate volume   Culture   Final    NO GROWTH 4 DAYS Performed at [facility], [place] [address] [place], [place] [address]    Report Status PENDING  Incomplete  Culture, blood (routine x 2)     Status: None (Preliminary result)   Collection Time: 04/19/20  9:58 AM   Specimen: BLOOD LEFT HAND  Result Value Ref Range Status   Specimen Description BLOOD LEFT HAND  Final   Special Requests   Final    BOTTLES DRAWN AEROBIC ONLY Blood Culture adequate volume   Culture   Final    NO GROWTH 4 DAYS Performed at [facility], [place] [address], [place], [place] [address]    Report Status PENDING  Incomplete  Culture, Urine     Status: None   Collection Time: 04/19/20  1:29 PM   Specimen: Urine, Random  Result Value Ref Range Status   Specimen Description URINE, RANDOM  Final   Special Requests NONE  Final   Culture   Final    NO GROWTH Performed at [facility], [place] [address], [place], [place] [address]    Report Status 04/20/2020 FINAL  Final  SARS CORONAVIRUS 2 (TAT 6-24 HRS) Nasopharyngeal Nasopharyngeal Swab     Status: None   Collection Time: 04/23/20  1:19 PM   Specimen: Nasopharyngeal Swab  Result Value Ref Range Status   SARS Coronavirus 2 NEGATIVE NEGATIVE Final    Comment: (NOTE) SARS-CoV-2 target nucleic acids are NOT DETECTED.  The SARS-CoV-2 RNA is generally detectable in upper and lower respiratory specimens during the acute phase of infection. Negative results do not preclude SARS-CoV-2 infection, do not rule out co-infections with other pathogens, and should not be used as the sole basis for treatment or other patient management decisions. Negative results must be combined with clinical observations, patient  history, and epidemiological information. The expected result is Negative.  Fact Sheet for Patients: SugarRoll.be  Fact Sheet for Healthcare Providers: https://www.woods-mathews.com/  This test is not yet approved or cleared by the [place] FDA and  has been authorized for detection and/or diagnosis of SARS-CoV-2 by FDA under an Emergency Use Authorization (EUA). This EUA will remain  in effect (meaning this test can be used) for the duration of the COVID-19 declaration under Se ction 564(b)(1) of the Act, 21 U.S.C. section 360bbb-3(b)(1), unless the authorization is terminated or revoked sooner.  Performed at [facility], [place] [address] [place], [place] [address]      Time coordinating discharge:35 minutes  SIGNED:   Jonnie Finner, DO  [facility] 04/24/2020, 2:36 PM   If 7PM-7AM, please contact night-coverage [email or web address]

## 2020-04-24 NOTE — TOC Transition Note (Addendum)
Transition of Care [facility]) - CM/SW Discharge Note   Patient Details  Name: Kelli Peterson MRN: [id number] Date of Birth: 1929-11-03  Transition of Care [facility] CM/SW Contact:  Trula Ore, [place] Phone Number: 04/24/2020, 2:58 PM   Clinical Narrative:     Patient will DC to: [facility]  Anticipated DC date: 04/24/2020  Family notified: [name]  Transport by: Corey Harold  ?  Per MD patient ready for DC to [facility]. RN, patient, patient's family, and facility notified of DC. Discharge Summary sent to facility. RN given number for report [phone] RM# [id number] . DC packet on chart. Ambulance transport requested for patient.  CSW signing off.  Final next level of care: Skilled Nursing Facility Barriers to Discharge: No Barriers Identified   Patient Goals and CMS Choice Patient states their goals for this hospitalization and ongoing recovery are:: to go to SNF CMS Medicare.gov Compare Post Acute Care list provided to:: Patient Represenative (must comment) (Patients daughter [name]) Choice offered to / list presented to : Adult Children [name])  Discharge Placement              Patient chooses bed at: [place] [place] Patient to be transferred to facility by: [place] Name of family member notified: [name] Patient and family notified of of transfer: 04/24/20  Discharge Plan and Services                                     Social Determinants of Health (SDOH) Interventions     Readmission Risk Interventions No flowsheet data found.

## 2020-04-24 NOTE — Progress Notes (Signed)
  Speech Language Pathology Treatment: Dysphagia  Patient Details Name: Kelli Peterson MRN: [id number] DOB: 23-Sep-1930 Today's Date: 04/24/2020 Time: 1884-1660 SLP Time Calculation (min) (ACUTE ONLY): 22 min  Assessment / Plan / Recommendation Clinical Impression  Pt was seen for dysphagia treatment. Nursing and pt's daughter reported that the pt has been consuming the current diet without overt s/sx of aspiration and that she has been consistently using the chin tuck posture. Pt's daughter was educated regarding the results of yesterday's modified barium swallow study and recommendations. Video recording of the study was used to facilitate education and she verbalized understanding as well as agreement. Pt completed lingual retraction exercises, the masako, and effortful swallows with cueing and models to improve accuracy. Use of the chin tuck posture was inconsistent during this session and she exhibited coughing when it was not used with nectar thick liquids. Pt currently has discharge orders and she would benefit from continued treatment following discharge.    HPI HPI: Pt is an 86 y/o female admitted secondary to palpitations and SOB x3 weeks. Pt found to have acute respiratory failure with hypoxemia and a-fib with RVR. Pt was noted to cough with thin liquids and had an increase in WBC, prompting swallow evaluation. PMH including but not limited to Barrett's esophagus, cerebellar hemorrhage, HTN, CHF, a-fib and cervical cancer.      SLP Plan  Continue with current plan of care       Recommendations  Diet recommendations: Dysphagia 3 (mechanical soft);Nectar-thick liquid Liquids provided via: Cup;No straw Medication Administration: Whole meds with puree Supervision: Full supervision/cueing for compensatory strategies Compensations: Minimize environmental distractions;Slow rate;Small sips/bites;Monitor for anterior loss;Chin tuck Postural Changes and/or Swallow Maneuvers: Seated upright  90 degrees                Oral Care Recommendations: Oral care BID Follow up Recommendations: Skilled Nursing facility SLP Visit Diagnosis: Dysphagia, unspecified (R13.10) Plan: Continue with current plan of care       Mele Sylvester I. Hardin Negus, Mariano Colon, [place] Office number [phone] Pager [place] 04/24/2020, 4:56 PM

## 2020-04-24 NOTE — Progress Notes (Signed)
Progress Note  Patient Name: Kelli Peterson Date of Encounter: 04/24/2020  [facility] Cardiologist: Fransico Him, MD  (new this admission)  Subjective   Sitting up in chair with PT. Daughter at bedside.  Inpatient Medications    Scheduled Meds: . apixaban  2.5 mg Oral BID  . atorvastatin  20 mg Oral Daily  . brimonidine  1 drop Right Eye BID  . diltiazem  240 mg Oral Daily  . feeding supplement (ENSURE ENLIVE)  237 mL Oral BID BM  . furosemide  40 mg Intravenous BID  . latanoprost  1 drop Both Eyes Q1200  . mouth rinse  15 mL Mouth Rinse BID  . metoprolol succinate  75 mg Oral Daily  . multivitamin with minerals  1 tablet Oral Daily  . Netarsudil Dimesylate  1 drop Right Eye Daily  . sodium chloride flush  3 mL Intravenous Q12H   Continuous Infusions: . sodium chloride 250 mL (04/21/20 0439)   PRN Meds: sodium chloride, acetaminophen, ipratropium-albuterol, ondansetron (ZOFRAN) IV, Resource ThickenUp Clear, sodium chloride flush   Vital Signs    Vitals:   04/23/20 1621 04/24/20 0001 04/24/20 0607 04/24/20 0753  BP: 112/71 112/69 (!) 151/78 138/90  Pulse: 95 95 83 100  Resp: (!) 22 20 17 20   Temp: 97.7 F (36.5 C) 98 F (36.7 C) 97.7 F (36.5 C) 99 F (37.2 C)  TempSrc: Oral Oral Axillary Oral  SpO2: 90% 92% 97% 93%  Weight:   51.1 kg   Height:        Intake/Output Summary (Last 24 hours) at 04/24/2020 0847 Last data filed at 04/24/2020 0700 Gross per 24 hour  Intake 450 ml  Output 1300 ml  Net -850 ml   Last 3 Weights 04/24/2020 04/23/2020 04/22/2020  Weight (lbs) 112 lb 10.5 oz 115 lb 4.8 oz 110 lb 3.7 oz  Weight (kg) 51.1 kg 52.3 kg 50 kg      Telemetry    Atrial fib with variable rates 80-110, at times faster at 120-140 - Personally Reviewed  Physical Exam   GEN: No acute distress.   Neck: No JVD Cardiac: irregular rhythm, tachycardic no murmurs, rubs, or gallops.  Respiratory: coarse bs diffusely GI: Soft, nontender, non-distended  MS:  No edema; No deformity. Neuro:  Nonfocal  Psych: Normal affect    Labs    High Sensitivity Troponin:  No results for input(s): TROPONINIHS in the last 720 hours.    Chemistry Recent Labs  Lab 04/21/20 0524 04/21/20 0524 04/22/20 0446 04/23/20 0634 04/24/20 0424  NA 139   < > 139 141 140  K 3.6   < > 3.3* 4.3 3.7  CL 95*   < > 92* 97* 96*  CO2 35*   < > 38* 35* 33*  GLUCOSE 94   < > 98 95 102*  BUN 19   < > 18 19 18   CREATININE 1.13*   < > 1.12* 1.15* 1.08*  CALCIUM 8.7*   < > 8.7* 9.3 9.1  PROT 6.7  --  6.8 6.6  --   ALBUMIN 2.2*  --  2.3* 2.4*  --   AST 35  --  39 50*  --   ALT 24  --  27 36  --   ALKPHOS 47  --  47 46  --   BILITOT 0.7  --  0.8 0.4  --   GFRNONAA 44*   < > 44* 43* 46*  GFRAA 51*   < >  51* 50* 53*  ANIONGAP 9   < > 9 9 11    < > = values in this interval not displayed.     Hematology Recent Labs  Lab 04/22/20 0446 04/23/20 0634 04/24/20 0424  WBC 6.6 6.0 6.9  RBC 3.86* 3.83* 3.91  HGB 10.0* 9.7* 10.1*  HCT 31.8* 31.7* 32.5*  MCV 82.4 82.8 83.1  MCH 25.9* 25.3* 25.8*  MCHC 31.4 30.6 31.1  RDW 18.3* 18.5* 18.6*  PLT 257 242 223    BNP No results for input(s): BNP, PROBNP in the last 168 hours.   DDimer No results for input(s): DDIMER in the last 168 hours.   Radiology    CT Chest High Resolution  Result Date: 04/22/2020 CLINICAL DATA:  Inpatient. Respiratory failure. Persistent oxygen requirement. Abnormal chest radiograph. EXAM: CT CHEST WITHOUT CONTRAST TECHNIQUE: Multidetector CT imaging of the chest was performed following the standard protocol without intravenous contrast. High resolution imaging of the lungs, as well as inspiratory and expiratory imaging, was performed. COMPARISON:  Chest radiograph from earlier today. FINDINGS: Motion degraded scan, limiting assessment. Cardiovascular: Moderate cardiomegaly. No significant pericardial effusion/thickening. Three-vessel coronary atherosclerosis. Atherosclerotic nonaneurysmal thoracic  aorta. Dilated main pulmonary artery (3.9 cm diameter). Mediastinum/Nodes: No discrete thyroid nodules. Unremarkable esophagus. No axillary adenopathy. Multiple enlarged right paratracheal nodes up to 1.8 cm short axis diameter (series 3/image 49). No discrete hilar adenopathy on these noncontrast images. Lungs/Pleura: No pneumothorax. Trace dependent left pleural effusion. No right pleural effusion. Mosaic attenuation throughout both lungs, without convincing air trapping on the limited expiration sequence. Diffuse bronchial wall thickening. Patchy ground-glass opacity and interlobular septal thickening throughout both lungs, most prominent in the lower lobes. Hypoventilatory changes in the dependent lower lobes. No lung masses or significant pulmonary nodules. No frank honeycombing. No significant regions of bronchiectasis. Upper abdomen: Simple 4.4 cm posterior upper left renal cyst. Musculoskeletal: No aggressive appearing focal osseous lesions. Severe cough T3, T7, T8 and T9 vertebral compression fractures, worsened at T8 and otherwise stable since 08/07/2019 radiographs. Moderate thoracic spondylosis. IMPRESSION: 1. Moderate cardiomegaly. Dilated main pulmonary artery, suggesting pulmonary arterial hypertension. Trace dependent left pleural effusion. 2. Mosaic attenuation in both lungs, favoring mosaic perfusion due to pulmonary vascular disease. 3. Nonspecific patchy ground-glass opacity and septal thickening throughout both lungs, most prominent in the lower lobes. Favor a combination of pulmonary edema, nonspecific postinfectious/postinflammatory scarring and atelectasis. An underlying basilar predominant interstitial lung disease is less favored but difficult to entirely exclude. Follow-up high-resolution chest CT could be considered in 6 months as clinically warranted. 4. Nonspecific diffuse bronchial wall thickening, suggesting chronic bronchitis and/or reactive airways disease. 5. Three-vessel coronary  atherosclerosis. 6. Nonspecific mild mediastinal lymphadenopathy, potentially reactive. 7. Severe chronic T3, T7, T8 and T9 vertebral compression fractures. 8. Aortic Atherosclerosis (ICD10-I70.0). Electronically Signed   By: Ilona Sorrel M.D.   On: 04/22/2020 17:01   DG CHEST PORT 1 VIEW  Result Date: 04/23/2020 CLINICAL DATA:  Shortness of breath. EXAM: PORTABLE CHEST 1 VIEW COMPARISON:  April 22, 2020. FINDINGS: Stable cardiomediastinal silhouette. No pneumothorax or pleural effusion is noted. Stable diffuse interstitial densities are noted throughout both lungs which may represent scarring, although superimposed inflammation or edema cannot be excluded. Old right rib fractures are noted. IMPRESSION: Stable diffuse interstitial densities are noted throughout both lungs which may represent scarring, although superimposed inflammation or edema cannot be excluded. Aortic Atherosclerosis (ICD10-I70.0). Electronically Signed   By: Marijo Conception M.D.   On: 04/23/2020 08:35   DG Swallowing Func-Speech  Pathology  Result Date: 04/23/2020 Objective Swallowing Evaluation: Type of Study: MBS-Modified Barium Swallow Study  Patient Details Name: JAMAL HASKIN MRN: [id number] Date of Birth: 1929/10/22 Today's Date: 04/23/2020 Time: SLP Start Time (ACUTE ONLY): 1116 -SLP Stop Time (ACUTE ONLY): 1135 SLP Time Calculation (min) (ACUTE ONLY): 19 min Past Medical History: Past Medical History: Diagnosis Date . Adenomatous colon polyp 2007 . Atrial fibrillation ([place])  . Barrett's esophagus 2007 . Cerebellar hemorrhage ([place])  . Cervical cancer ([place])  . CHF (congestive heart failure) ([place])  . Closed pelvic fracture ([place])  . CVD (cardiovascular disease)  . DVT (deep venous thrombosis) ([place])  . Fibrocystic breast disease  . Fracture of pubic ramus ([place])  . Hyperlipidemia  . Hypertension  . Iron deficiency anemia  Past Surgical History: Past Surgical History: Procedure Laterality Date . CATARACT EXTRACTION Right 02/15/2010  [facility]  . CATARACT EXTRACTION Left 01/30/2010  [facility] . CATARACT EXTRACTION, BILATERAL  2010 . TOTAL ABDOMINAL HYSTERECTOMY  1964 HPI: Pt is an 86 y/o female admitted secondary to palpitations and SOB x3 weeks. Pt found to have acute respiratory failure with hypoxemia and a-fib with RVR. Pt was noted to cough with thin liquids and had an increase in WBC, prompting swallow evaluation. PMH including but not limited to Barrett's esophagus, cerebellar hemorrhage, HTN, CHF, a-fib and cervical cancer. CXR 7/21: Stable diffuse interstitial densities are noted throughout both lungs which may represent scarring, although superimposed inflammation or edema cannot be excluded.  Subjective: says she has trouble swallowing, dtr agrees - has been going on for a while Assessment / Plan / Recommendation CHL IP CLINICAL IMPRESSIONS 04/23/2020 Clinical Impression Pt presents with oropharyngeal dysphagia characterized by reduced bolus cohesion, reduced lingual retraction, reduced pharyngeal constriction, a pharyngeal delay, and reduced anterior laryngeal movement. She demonstrated premature spillage to the valleculae and pyriform sinuses, mild vallecular residue, mild pyriform sinus residue, trace posterior pharyngeal wall residue, silent aspiration (PAS 8) of thin liquids, and penetration (PAS 3,5) as well as inconsistently sensed aspiration (PAS 7,8) of nectar thick liquids when a chin tuck posture was not utilized. Mild backflow of material was noted from the lower to upper thoracic esophagus but not to the pharynx. A dysphagia 3 diet with nectar thick liquids is recommended at this time with strict observance of swallowing precautions including consistent use of chin tuck posture. SLP will follow for dysphagia treatment.  SLP Visit Diagnosis Dysphagia, oropharyngeal phase (R13.12) Attention and concentration deficit following -- Frontal lobe and executive function deficit following -- Impact on safety and function Moderate aspiration  risk;Mild aspiration risk   CHL IP TREATMENT RECOMMENDATION 04/23/2020 Treatment Recommendations Therapy as outlined in treatment plan below   Prognosis 04/23/2020 Prognosis for Safe Diet Advancement Fair Barriers to Reach Goals Time post onset;Severity of deficits Barriers/Prognosis Comment -- CHL IP DIET RECOMMENDATION 04/23/2020 SLP Diet Recommendations Dysphagia 3 (Mech soft) solids;Nectar thick liquid Liquid Administration via Cup;No straw Medication Administration Whole meds with puree Compensations Minimize environmental distractions;Slow rate;Small sips/bites;Follow solids with liquid;Chin tuck Postural Changes Remain semi-upright after after feeds/meals (Comment);Seated upright at 90 degrees   CHL IP OTHER RECOMMENDATIONS 04/23/2020 Recommended Consults -- Oral Care Recommendations Oral care BID Other Recommendations Order thickener from pharmacy;Prohibited food (jello, ice cream, thin soups);Remove water pitcher;Have oral suction available   CHL IP FOLLOW UP RECOMMENDATIONS 04/23/2020 Follow up Recommendations Skilled Nursing facility   [facility] IP FREQUENCY AND DURATION 04/23/2020 Speech Therapy Frequency (ACUTE ONLY) min 2x/week Treatment Duration 2 weeks      CHL IP  ORAL PHASE 04/23/2020 Oral Phase Impaired Oral - Pudding Teaspoon -- Oral - Pudding Cup -- Oral - Honey Teaspoon -- Oral - Honey Cup Premature spillage;Decreased bolus cohesion Oral - Nectar Teaspoon -- Oral - Nectar Cup Premature spillage;Decreased bolus cohesion Oral - Nectar Straw Premature spillage;Decreased bolus cohesion Oral - Thin Teaspoon -- Oral - Thin Cup Premature spillage;Decreased bolus cohesion Oral - Thin Straw -- Oral - Puree Premature spillage;Decreased bolus cohesion Oral - Mech Soft WFL Oral - Regular Impaired mastication Oral - Multi-Consistency -- Oral - Pill -- Oral Phase - Comment --  CHL IP PHARYNGEAL PHASE 04/23/2020 Pharyngeal Phase Impaired Pharyngeal- Pudding Teaspoon -- Pharyngeal -- Pharyngeal- Pudding Cup -- Pharyngeal --  Pharyngeal- Honey Teaspoon -- Pharyngeal -- Pharyngeal- Honey Cup Delayed swallow initiation-vallecula;Reduced anterior laryngeal mobility;Pharyngeal residue - valleculae;Reduced tongue base retraction;Pharyngeal residue - posterior pharnyx Pharyngeal -- Pharyngeal- Nectar Teaspoon -- Pharyngeal -- Pharyngeal- Nectar Cup Delayed swallow initiation-vallecula;Reduced anterior laryngeal mobility;Pharyngeal residue - valleculae;Reduced tongue base retraction;Pharyngeal residue - posterior pharnyx;Delayed swallow initiation-pyriform sinuses;Penetration/Aspiration before swallow;Penetration/Aspiration during swallow Pharyngeal Material enters airway, remains ABOVE vocal cords and not ejected out;Material enters airway, CONTACTS cords and not ejected out;Material enters airway, passes BELOW cords without attempt by patient to eject out (silent aspiration) Pharyngeal- Nectar Straw Delayed swallow initiation-vallecula;Reduced anterior laryngeal mobility;Pharyngeal residue - valleculae;Reduced tongue base retraction;Pharyngeal residue - posterior pharnyx;Penetration/Aspiration before swallow;Penetration/Aspiration during swallow Pharyngeal Material enters airway, passes BELOW cords and not ejected out despite cough attempt by patient;Material enters airway, CONTACTS cords and not ejected out Pharyngeal- Thin Teaspoon -- Pharyngeal -- Pharyngeal- Thin Cup Delayed swallow initiation-vallecula;Reduced anterior laryngeal mobility;Pharyngeal residue - valleculae;Reduced tongue base retraction;Pharyngeal residue - posterior pharnyx;Penetration/Aspiration before swallow;Penetration/Aspiration during swallow Pharyngeal Material enters airway, passes BELOW cords without attempt by patient to eject out (silent aspiration) Pharyngeal- Thin Straw -- Pharyngeal -- Pharyngeal- Puree Reduced anterior laryngeal mobility;Pharyngeal residue - valleculae;Reduced tongue base retraction;Pharyngeal residue - posterior pharnyx Pharyngeal --  Pharyngeal- Mechanical Soft Reduced anterior laryngeal mobility;Pharyngeal residue - valleculae;Reduced tongue base retraction;Pharyngeal residue - posterior pharnyx Pharyngeal -- Pharyngeal- Regular Reduced anterior laryngeal mobility;Pharyngeal residue - valleculae;Reduced tongue base retraction;Pharyngeal residue - posterior pharnyx Pharyngeal -- Pharyngeal- Multi-consistency -- Pharyngeal -- Pharyngeal- Pill Reduced anterior laryngeal mobility;Pharyngeal residue - valleculae;Reduced tongue base retraction;Pharyngeal residue - posterior pharnyx Pharyngeal -- Pharyngeal Comment --  CHL IP CERVICAL ESOPHAGEAL PHASE 04/23/2020 Cervical Esophageal Phase See Impressions  Pudding Teaspoon -- Pudding Cup -- Honey Teaspoon -- Honey Cup -- Nectar Teaspoon -- Nectar Cup -- Nectar Straw -- Thin Teaspoon -- Thin Cup -- Thin Straw -- Puree -- Mechanical Soft -- Regular -- Multi-consistency -- Pill -- Cervical Esophageal Comment -- Shanika I. Hardin Negus, [place], [place] Office number [phone] Pager [place] 04/23/2020, 2:06 PM               Cardiac Studies   2D Echo 04/16/20 1. Left ventricular ejection fraction, by estimation, is 50 to 55%. The  left ventricle has low normal function. The left ventricle has no regional  wall motion abnormalities. There is mild concentric left ventricular  hypertrophy. Left ventricular  diastolic parameters are indeterminate.  2. Right ventricular systolic function is mildly reduced. The right  ventricular size is normal. There is moderately elevated pulmonary artery  systolic pressure.  3. Left atrial size was severely dilated.  4. Right atrial size was severely dilated.  5. The mitral valve is normal in structure. Mild mitral valve  regurgitation. No evidence of mitral stenosis.  6. Tricuspid valve regurgitation  is moderate to severe.  7. The aortic valve is normal in structure. Aortic valve regurgitation is  not  visualized. No aortic stenosis is present.  8. The inferior vena cava is normal in size with greater than 50%  respiratory variability, suggesting right atrial pressure of 3 mmHg.   Patient Profile     86 y.o. female atrial fibrillation on chronic a/c with Eliquis (historically managed by PCP), HTN, HLD, CKD stage II and chronic diastolic CHF who presented to [facility] with recent DOE, lower extremity edema, and orthopnea, found to have hypoxia and AF-RVR. She has been treated for both CHF and possible PNA (with leukocytosis). Covid test negative. Other issues include metabolic acidosis, normocytic anemia, AKI on CKD stage III, hypokalemia.  Assessment & Plan   Discussed plan with Dr. Marylyn Ishihara from primary service.   1. Acute hypoxic respiratory failure felt multifactorial related to acute on chronic diastolic CHF and possible pneumonia - 2D echo 04/16/20 showed EF 50-55%, mild LVH, RV function mildly reduced, moderately elevated PASP, severe biatrial enlargement, mild MR, moderate-severe TR -CT chest shows combination of pulmonary edema, scarring and atelectasis, consider basilar predominant ILD.  - transition to oral diuresis today - torsemide 20 mg BID -recommend bmet in 3-5 days and f/u with PCP to adjust diuretic if needed. -she has an oxygen requirement which may be multifactorial.   2. Chronic atrial fibrillation with RVR this admission - home regimen included diltiazem 180mg  daily, digoxin 0.125mg  daily. Here she is on Toprol 75mg  daily diltiazem 240 mg daily.  - digoxin has been stopped this admission given elderly/renal insufficiency  - continue apixaban as tolerated at adjusted dose  3. Moderate pulmonary HTN - felt group 2 related to pulmonary venous HTN from CHF - diuretic plan as above  4. Essential HTN - controlled, managed in context of the above.  5. AKI superimposed on CKD stage II  - peak Cr 1.45, improved today, 1.08.  6. Hypokalemia - KCL 20 mEq daily until follow up  7.  Anemia - Hgb previously 13 range in 2020, now in the 8-10 range this admission. May be contributing to elevated rates. No bleeding reported. - further per IM - needs close OP monitoring by primary care given anticoagulation   For questions or updates, please contact [place] Please consult [email or web address] for contact info under        Signed, Elouise Munroe, MD  04/24/2020, 8:47 AM

## 2020-04-24 NOTE — Progress Notes (Signed)
Patient mentioned last night that she does not want to go to SNF but wants to go home with daughter. Told her I would make a note of it and pass it on to day shift nurse.

## 2020-04-24 NOTE — Progress Notes (Signed)
Nutrition Follow-up  DOCUMENTATION CODES:   Severe malnutrition in context of chronic illness  INTERVENTION:   -Continue Ensure Enlive po BID, each supplement provides 350 kcal and 20 grams of protein -Continue MVI with minerals daily -Continue Magic cup daily with meals, each supplement provides 290 kcal and 9 grams of protein  NUTRITION DIAGNOSIS:   Severe Malnutrition related to chronic illness (CHF) as evidenced by severe muscle depletion, severe fat depletion.  Ongoing  GOAL:   Patient will meet greater than or equal to 90% of their needs  Progressing   MONITOR:   PO intake, Supplement acceptance  REASON FOR ASSESSMENT:   Malnutrition Screening Tool    ASSESSMENT:   86 yo female admitted with A fib and CHF exacerbation. PMH includes A fib, HTN, HLD, CHF, Barrett's esophagus, iron deficiency anemia.  7/17- s/p BSE- recommended dysphagia 2 diet with nectar thick liquids 7/21- s/p MBSS- advanced to dysphagia 3 diet with nectar thick liquids  Reviewed I/O's: -850 ml x 24 hours and -5.6 L since admission  Pt resting in recliner chair at time of visit. RD did not disturb.   Appetite has improved; noted meal completions 100%. Per SLP notes, pt is doing well on nectar thickened liquids. She is consuming Ensure.   Plan to d/c to SNF when bed is available.  Medications reviewed and include cardizem and lasix.   Labs reviewed.   Diet Order:   Diet Order            DIET DYS 3 Room service appropriate? Yes with Assist; Fluid consistency: Nectar Thick  Diet effective now                 EDUCATION NEEDS:   No education needs have been identified at this time  Skin:  Skin Assessment: Skin Integrity Issues: Skin Integrity Issues:: Stage II Stage II: L buttocks  Last BM:  04/22/20  Height:   Ht Readings from Last 1 Encounters:  04/21/20 5' (1.524 m)    Weight:   Wt Readings from Last 1 Encounters:  04/24/20 51.1 kg    Ideal Body Weight:  45.5  kg  BMI:  Body mass index is 22 kg/m.  Estimated Nutritional Needs:   Kcal:  1400-1600  Protein:  65-75 gm  Fluid:  >/= 1.4 L    Loistine Chance, RD, LDN, [place] Registered Dietitian II Certified Diabetes Care and Education Specialist Please refer to [facility] for RD and/or RD on-call/weekend/after hours pager

## 2020-04-24 NOTE — Progress Notes (Signed)
Patient and daughter Arbie Cookey notified of planned transfer to [facility] today.  Report called to Corey Skains RN at [facility].  Awaiting PTAR for d/c.

## 2020-04-26 DIAGNOSIS — I10 Essential (primary) hypertension: Secondary | ICD-10-CM | POA: Diagnosis not present

## 2020-05-08 DIAGNOSIS — I272 Pulmonary hypertension, unspecified: Secondary | ICD-10-CM | POA: Diagnosis not present

## 2020-05-10 DIAGNOSIS — I272 Pulmonary hypertension, unspecified: Secondary | ICD-10-CM | POA: Diagnosis not present

## 2020-05-10 DIAGNOSIS — I4891 Unspecified atrial fibrillation: Secondary | ICD-10-CM | POA: Diagnosis not present

## 2020-05-10 DIAGNOSIS — D631 Anemia in chronic kidney disease: Secondary | ICD-10-CM | POA: Diagnosis not present

## 2020-05-10 DIAGNOSIS — I700 Atherosclerosis of aorta: Secondary | ICD-10-CM | POA: Diagnosis not present

## 2020-05-10 DIAGNOSIS — M8008XD Age-related osteoporosis with current pathological fracture, vertebra(e), subsequent encounter for fracture with routine healing: Secondary | ICD-10-CM | POA: Diagnosis not present

## 2020-05-10 DIAGNOSIS — I5033 Acute on chronic diastolic (congestive) heart failure: Secondary | ICD-10-CM | POA: Diagnosis not present

## 2020-05-10 DIAGNOSIS — Z86718 Personal history of other venous thrombosis and embolism: Secondary | ICD-10-CM | POA: Diagnosis not present

## 2020-05-10 DIAGNOSIS — I130 Hypertensive heart and chronic kidney disease with heart failure and stage 1 through stage 4 chronic kidney disease, or unspecified chronic kidney disease: Secondary | ICD-10-CM | POA: Diagnosis not present

## 2020-05-10 DIAGNOSIS — R1310 Dysphagia, unspecified: Secondary | ICD-10-CM | POA: Diagnosis not present

## 2020-05-10 DIAGNOSIS — N1832 Chronic kidney disease, stage 3b: Secondary | ICD-10-CM | POA: Diagnosis not present

## 2020-05-10 DIAGNOSIS — J849 Interstitial pulmonary disease, unspecified: Secondary | ICD-10-CM | POA: Diagnosis not present

## 2020-05-10 DIAGNOSIS — K2270 Barrett's esophagus without dysplasia: Secondary | ICD-10-CM | POA: Diagnosis not present

## 2020-05-10 DIAGNOSIS — Z7901 Long term (current) use of anticoagulants: Secondary | ICD-10-CM | POA: Diagnosis not present

## 2020-05-10 DIAGNOSIS — L89321 Pressure ulcer of left buttock, stage 1: Secondary | ICD-10-CM | POA: Diagnosis not present

## 2020-05-10 DIAGNOSIS — E876 Hypokalemia: Secondary | ICD-10-CM | POA: Diagnosis not present

## 2020-05-10 DIAGNOSIS — E785 Hyperlipidemia, unspecified: Secondary | ICD-10-CM | POA: Diagnosis not present

## 2020-05-10 DIAGNOSIS — H409 Unspecified glaucoma: Secondary | ICD-10-CM | POA: Diagnosis not present

## 2020-05-12 DIAGNOSIS — I130 Hypertensive heart and chronic kidney disease with heart failure and stage 1 through stage 4 chronic kidney disease, or unspecified chronic kidney disease: Secondary | ICD-10-CM | POA: Diagnosis not present

## 2020-05-12 DIAGNOSIS — L89321 Pressure ulcer of left buttock, stage 1: Secondary | ICD-10-CM | POA: Diagnosis not present

## 2020-05-12 DIAGNOSIS — N1832 Chronic kidney disease, stage 3b: Secondary | ICD-10-CM | POA: Diagnosis not present

## 2020-05-12 DIAGNOSIS — M8008XD Age-related osteoporosis with current pathological fracture, vertebra(e), subsequent encounter for fracture with routine healing: Secondary | ICD-10-CM | POA: Diagnosis not present

## 2020-05-12 DIAGNOSIS — I272 Pulmonary hypertension, unspecified: Secondary | ICD-10-CM | POA: Diagnosis not present

## 2020-05-12 DIAGNOSIS — H409 Unspecified glaucoma: Secondary | ICD-10-CM | POA: Diagnosis not present

## 2020-05-12 DIAGNOSIS — R1310 Dysphagia, unspecified: Secondary | ICD-10-CM | POA: Diagnosis not present

## 2020-05-12 DIAGNOSIS — Z7901 Long term (current) use of anticoagulants: Secondary | ICD-10-CM | POA: Diagnosis not present

## 2020-05-12 DIAGNOSIS — E785 Hyperlipidemia, unspecified: Secondary | ICD-10-CM | POA: Diagnosis not present

## 2020-05-12 DIAGNOSIS — D631 Anemia in chronic kidney disease: Secondary | ICD-10-CM | POA: Diagnosis not present

## 2020-05-12 DIAGNOSIS — Z86718 Personal history of other venous thrombosis and embolism: Secondary | ICD-10-CM | POA: Diagnosis not present

## 2020-05-12 DIAGNOSIS — I4891 Unspecified atrial fibrillation: Secondary | ICD-10-CM | POA: Diagnosis not present

## 2020-05-12 DIAGNOSIS — E876 Hypokalemia: Secondary | ICD-10-CM | POA: Diagnosis not present

## 2020-05-12 DIAGNOSIS — K2270 Barrett's esophagus without dysplasia: Secondary | ICD-10-CM | POA: Diagnosis not present

## 2020-05-12 DIAGNOSIS — I5033 Acute on chronic diastolic (congestive) heart failure: Secondary | ICD-10-CM | POA: Diagnosis not present

## 2020-05-12 DIAGNOSIS — J849 Interstitial pulmonary disease, unspecified: Secondary | ICD-10-CM | POA: Diagnosis not present

## 2020-05-12 DIAGNOSIS — I700 Atherosclerosis of aorta: Secondary | ICD-10-CM | POA: Diagnosis not present

## 2020-05-14 DIAGNOSIS — M8008XD Age-related osteoporosis with current pathological fracture, vertebra(e), subsequent encounter for fracture with routine healing: Secondary | ICD-10-CM | POA: Diagnosis not present

## 2020-05-14 DIAGNOSIS — E785 Hyperlipidemia, unspecified: Secondary | ICD-10-CM | POA: Diagnosis not present

## 2020-05-14 DIAGNOSIS — Z7901 Long term (current) use of anticoagulants: Secondary | ICD-10-CM | POA: Diagnosis not present

## 2020-05-14 DIAGNOSIS — I5033 Acute on chronic diastolic (congestive) heart failure: Secondary | ICD-10-CM | POA: Diagnosis not present

## 2020-05-14 DIAGNOSIS — I130 Hypertensive heart and chronic kidney disease with heart failure and stage 1 through stage 4 chronic kidney disease, or unspecified chronic kidney disease: Secondary | ICD-10-CM | POA: Diagnosis not present

## 2020-05-14 DIAGNOSIS — N1832 Chronic kidney disease, stage 3b: Secondary | ICD-10-CM | POA: Diagnosis not present

## 2020-05-14 DIAGNOSIS — R0602 Shortness of breath: Secondary | ICD-10-CM

## 2020-05-14 DIAGNOSIS — I4891 Unspecified atrial fibrillation: Secondary | ICD-10-CM | POA: Diagnosis not present

## 2020-05-14 DIAGNOSIS — Z86718 Personal history of other venous thrombosis and embolism: Secondary | ICD-10-CM | POA: Diagnosis not present

## 2020-05-14 DIAGNOSIS — E876 Hypokalemia: Secondary | ICD-10-CM | POA: Diagnosis not present

## 2020-05-14 DIAGNOSIS — K2270 Barrett's esophagus without dysplasia: Secondary | ICD-10-CM | POA: Diagnosis not present

## 2020-05-14 DIAGNOSIS — R1310 Dysphagia, unspecified: Secondary | ICD-10-CM | POA: Diagnosis not present

## 2020-05-14 DIAGNOSIS — J849 Interstitial pulmonary disease, unspecified: Secondary | ICD-10-CM | POA: Diagnosis not present

## 2020-05-14 DIAGNOSIS — H409 Unspecified glaucoma: Secondary | ICD-10-CM | POA: Diagnosis not present

## 2020-05-14 DIAGNOSIS — D631 Anemia in chronic kidney disease: Secondary | ICD-10-CM | POA: Diagnosis not present

## 2020-05-14 DIAGNOSIS — I700 Atherosclerosis of aorta: Secondary | ICD-10-CM | POA: Diagnosis not present

## 2020-05-14 DIAGNOSIS — I272 Pulmonary hypertension, unspecified: Secondary | ICD-10-CM | POA: Diagnosis not present

## 2020-05-14 DIAGNOSIS — L89321 Pressure ulcer of left buttock, stage 1: Secondary | ICD-10-CM | POA: Diagnosis not present

## 2020-05-15 DIAGNOSIS — K2270 Barrett's esophagus without dysplasia: Secondary | ICD-10-CM | POA: Diagnosis not present

## 2020-05-15 DIAGNOSIS — D631 Anemia in chronic kidney disease: Secondary | ICD-10-CM | POA: Diagnosis not present

## 2020-05-15 DIAGNOSIS — Z86718 Personal history of other venous thrombosis and embolism: Secondary | ICD-10-CM | POA: Diagnosis not present

## 2020-05-15 DIAGNOSIS — L89321 Pressure ulcer of left buttock, stage 1: Secondary | ICD-10-CM | POA: Diagnosis not present

## 2020-05-15 DIAGNOSIS — E876 Hypokalemia: Secondary | ICD-10-CM | POA: Diagnosis not present

## 2020-05-15 DIAGNOSIS — I130 Hypertensive heart and chronic kidney disease with heart failure and stage 1 through stage 4 chronic kidney disease, or unspecified chronic kidney disease: Secondary | ICD-10-CM | POA: Diagnosis not present

## 2020-05-15 DIAGNOSIS — E785 Hyperlipidemia, unspecified: Secondary | ICD-10-CM | POA: Diagnosis not present

## 2020-05-15 DIAGNOSIS — M8008XD Age-related osteoporosis with current pathological fracture, vertebra(e), subsequent encounter for fracture with routine healing: Secondary | ICD-10-CM | POA: Diagnosis not present

## 2020-05-15 DIAGNOSIS — I4891 Unspecified atrial fibrillation: Secondary | ICD-10-CM | POA: Diagnosis not present

## 2020-05-15 DIAGNOSIS — J849 Interstitial pulmonary disease, unspecified: Secondary | ICD-10-CM | POA: Diagnosis not present

## 2020-05-15 DIAGNOSIS — R1310 Dysphagia, unspecified: Secondary | ICD-10-CM | POA: Diagnosis not present

## 2020-05-15 DIAGNOSIS — H409 Unspecified glaucoma: Secondary | ICD-10-CM | POA: Diagnosis not present

## 2020-05-15 DIAGNOSIS — I700 Atherosclerosis of aorta: Secondary | ICD-10-CM | POA: Diagnosis not present

## 2020-05-15 DIAGNOSIS — I272 Pulmonary hypertension, unspecified: Secondary | ICD-10-CM | POA: Diagnosis not present

## 2020-05-15 DIAGNOSIS — N1832 Chronic kidney disease, stage 3b: Secondary | ICD-10-CM | POA: Diagnosis not present

## 2020-05-15 DIAGNOSIS — I5033 Acute on chronic diastolic (congestive) heart failure: Secondary | ICD-10-CM | POA: Diagnosis not present

## 2020-05-15 DIAGNOSIS — Z7901 Long term (current) use of anticoagulants: Secondary | ICD-10-CM | POA: Diagnosis not present

## 2020-05-16 DIAGNOSIS — L89321 Pressure ulcer of left buttock, stage 1: Secondary | ICD-10-CM | POA: Diagnosis not present

## 2020-05-16 DIAGNOSIS — N1832 Chronic kidney disease, stage 3b: Secondary | ICD-10-CM | POA: Diagnosis not present

## 2020-05-16 DIAGNOSIS — I700 Atherosclerosis of aorta: Secondary | ICD-10-CM | POA: Diagnosis not present

## 2020-05-16 DIAGNOSIS — Z86718 Personal history of other venous thrombosis and embolism: Secondary | ICD-10-CM | POA: Diagnosis not present

## 2020-05-16 DIAGNOSIS — D631 Anemia in chronic kidney disease: Secondary | ICD-10-CM | POA: Diagnosis not present

## 2020-05-16 DIAGNOSIS — K2270 Barrett's esophagus without dysplasia: Secondary | ICD-10-CM | POA: Diagnosis not present

## 2020-05-16 DIAGNOSIS — I5033 Acute on chronic diastolic (congestive) heart failure: Secondary | ICD-10-CM | POA: Diagnosis not present

## 2020-05-16 DIAGNOSIS — H409 Unspecified glaucoma: Secondary | ICD-10-CM | POA: Diagnosis not present

## 2020-05-16 DIAGNOSIS — Z7901 Long term (current) use of anticoagulants: Secondary | ICD-10-CM | POA: Diagnosis not present

## 2020-05-16 DIAGNOSIS — I4891 Unspecified atrial fibrillation: Secondary | ICD-10-CM | POA: Diagnosis not present

## 2020-05-16 DIAGNOSIS — E785 Hyperlipidemia, unspecified: Secondary | ICD-10-CM | POA: Diagnosis not present

## 2020-05-16 DIAGNOSIS — R1310 Dysphagia, unspecified: Secondary | ICD-10-CM | POA: Diagnosis not present

## 2020-05-16 DIAGNOSIS — E876 Hypokalemia: Secondary | ICD-10-CM | POA: Diagnosis not present

## 2020-05-16 DIAGNOSIS — I272 Pulmonary hypertension, unspecified: Secondary | ICD-10-CM | POA: Diagnosis not present

## 2020-05-16 DIAGNOSIS — M8008XD Age-related osteoporosis with current pathological fracture, vertebra(e), subsequent encounter for fracture with routine healing: Secondary | ICD-10-CM | POA: Diagnosis not present

## 2020-05-16 DIAGNOSIS — J849 Interstitial pulmonary disease, unspecified: Secondary | ICD-10-CM | POA: Diagnosis not present

## 2020-05-16 DIAGNOSIS — I130 Hypertensive heart and chronic kidney disease with heart failure and stage 1 through stage 4 chronic kidney disease, or unspecified chronic kidney disease: Secondary | ICD-10-CM | POA: Diagnosis not present

## 2020-05-19 DIAGNOSIS — I4891 Unspecified atrial fibrillation: Secondary | ICD-10-CM | POA: Diagnosis not present

## 2020-05-19 DIAGNOSIS — H409 Unspecified glaucoma: Secondary | ICD-10-CM | POA: Diagnosis not present

## 2020-05-19 DIAGNOSIS — D631 Anemia in chronic kidney disease: Secondary | ICD-10-CM | POA: Diagnosis not present

## 2020-05-19 DIAGNOSIS — J849 Interstitial pulmonary disease, unspecified: Secondary | ICD-10-CM | POA: Diagnosis not present

## 2020-05-19 DIAGNOSIS — I700 Atherosclerosis of aorta: Secondary | ICD-10-CM | POA: Diagnosis not present

## 2020-05-19 DIAGNOSIS — E785 Hyperlipidemia, unspecified: Secondary | ICD-10-CM | POA: Diagnosis not present

## 2020-05-19 DIAGNOSIS — R1310 Dysphagia, unspecified: Secondary | ICD-10-CM | POA: Diagnosis not present

## 2020-05-19 DIAGNOSIS — L89321 Pressure ulcer of left buttock, stage 1: Secondary | ICD-10-CM | POA: Diagnosis not present

## 2020-05-19 DIAGNOSIS — N1832 Chronic kidney disease, stage 3b: Secondary | ICD-10-CM | POA: Diagnosis not present

## 2020-05-19 DIAGNOSIS — K2270 Barrett's esophagus without dysplasia: Secondary | ICD-10-CM | POA: Diagnosis not present

## 2020-05-19 DIAGNOSIS — M8008XD Age-related osteoporosis with current pathological fracture, vertebra(e), subsequent encounter for fracture with routine healing: Secondary | ICD-10-CM | POA: Diagnosis not present

## 2020-05-19 DIAGNOSIS — Z86718 Personal history of other venous thrombosis and embolism: Secondary | ICD-10-CM | POA: Diagnosis not present

## 2020-05-19 DIAGNOSIS — I130 Hypertensive heart and chronic kidney disease with heart failure and stage 1 through stage 4 chronic kidney disease, or unspecified chronic kidney disease: Secondary | ICD-10-CM | POA: Diagnosis not present

## 2020-05-19 DIAGNOSIS — I272 Pulmonary hypertension, unspecified: Secondary | ICD-10-CM | POA: Diagnosis not present

## 2020-05-19 DIAGNOSIS — I5033 Acute on chronic diastolic (congestive) heart failure: Secondary | ICD-10-CM | POA: Diagnosis not present

## 2020-05-19 DIAGNOSIS — Z7901 Long term (current) use of anticoagulants: Secondary | ICD-10-CM | POA: Diagnosis not present

## 2020-05-19 DIAGNOSIS — E876 Hypokalemia: Secondary | ICD-10-CM | POA: Diagnosis not present

## 2020-05-22 DIAGNOSIS — Z7901 Long term (current) use of anticoagulants: Secondary | ICD-10-CM | POA: Diagnosis not present

## 2020-05-22 DIAGNOSIS — K2270 Barrett's esophagus without dysplasia: Secondary | ICD-10-CM | POA: Diagnosis not present

## 2020-05-22 DIAGNOSIS — D631 Anemia in chronic kidney disease: Secondary | ICD-10-CM | POA: Diagnosis not present

## 2020-05-22 DIAGNOSIS — E785 Hyperlipidemia, unspecified: Secondary | ICD-10-CM | POA: Diagnosis not present

## 2020-05-22 DIAGNOSIS — M8008XD Age-related osteoporosis with current pathological fracture, vertebra(e), subsequent encounter for fracture with routine healing: Secondary | ICD-10-CM | POA: Diagnosis not present

## 2020-05-22 DIAGNOSIS — I272 Pulmonary hypertension, unspecified: Secondary | ICD-10-CM | POA: Diagnosis not present

## 2020-05-22 DIAGNOSIS — Z86718 Personal history of other venous thrombosis and embolism: Secondary | ICD-10-CM | POA: Diagnosis not present

## 2020-05-22 DIAGNOSIS — E876 Hypokalemia: Secondary | ICD-10-CM | POA: Diagnosis not present

## 2020-05-22 DIAGNOSIS — I130 Hypertensive heart and chronic kidney disease with heart failure and stage 1 through stage 4 chronic kidney disease, or unspecified chronic kidney disease: Secondary | ICD-10-CM | POA: Diagnosis not present

## 2020-05-22 DIAGNOSIS — N1832 Chronic kidney disease, stage 3b: Secondary | ICD-10-CM | POA: Diagnosis not present

## 2020-05-22 DIAGNOSIS — L89321 Pressure ulcer of left buttock, stage 1: Secondary | ICD-10-CM | POA: Diagnosis not present

## 2020-05-22 DIAGNOSIS — R1310 Dysphagia, unspecified: Secondary | ICD-10-CM | POA: Diagnosis not present

## 2020-05-22 DIAGNOSIS — I700 Atherosclerosis of aorta: Secondary | ICD-10-CM | POA: Diagnosis not present

## 2020-05-22 DIAGNOSIS — J849 Interstitial pulmonary disease, unspecified: Secondary | ICD-10-CM | POA: Diagnosis not present

## 2020-05-22 DIAGNOSIS — H409 Unspecified glaucoma: Secondary | ICD-10-CM | POA: Diagnosis not present

## 2020-05-22 DIAGNOSIS — I4891 Unspecified atrial fibrillation: Secondary | ICD-10-CM | POA: Diagnosis not present

## 2020-05-22 DIAGNOSIS — I5033 Acute on chronic diastolic (congestive) heart failure: Secondary | ICD-10-CM | POA: Diagnosis not present

## 2020-05-23 DIAGNOSIS — I4891 Unspecified atrial fibrillation: Secondary | ICD-10-CM | POA: Diagnosis not present

## 2020-05-23 DIAGNOSIS — I10 Essential (primary) hypertension: Secondary | ICD-10-CM | POA: Diagnosis not present

## 2020-05-23 DIAGNOSIS — L8990 Pressure ulcer of unspecified site, unspecified stage: Secondary | ICD-10-CM | POA: Diagnosis not present

## 2020-05-23 DIAGNOSIS — M810 Age-related osteoporosis without current pathological fracture: Secondary | ICD-10-CM | POA: Diagnosis not present

## 2020-05-23 DIAGNOSIS — I509 Heart failure, unspecified: Secondary | ICD-10-CM | POA: Diagnosis not present

## 2020-05-26 DIAGNOSIS — N1832 Chronic kidney disease, stage 3b: Secondary | ICD-10-CM | POA: Diagnosis not present

## 2020-05-26 DIAGNOSIS — H409 Unspecified glaucoma: Secondary | ICD-10-CM | POA: Diagnosis not present

## 2020-05-26 DIAGNOSIS — D631 Anemia in chronic kidney disease: Secondary | ICD-10-CM | POA: Diagnosis not present

## 2020-05-26 DIAGNOSIS — L89321 Pressure ulcer of left buttock, stage 1: Secondary | ICD-10-CM | POA: Diagnosis not present

## 2020-05-26 DIAGNOSIS — I272 Pulmonary hypertension, unspecified: Secondary | ICD-10-CM | POA: Diagnosis not present

## 2020-05-26 DIAGNOSIS — I4891 Unspecified atrial fibrillation: Secondary | ICD-10-CM | POA: Diagnosis not present

## 2020-05-26 DIAGNOSIS — Z7901 Long term (current) use of anticoagulants: Secondary | ICD-10-CM | POA: Diagnosis not present

## 2020-05-26 DIAGNOSIS — I700 Atherosclerosis of aorta: Secondary | ICD-10-CM | POA: Diagnosis not present

## 2020-05-26 DIAGNOSIS — R1310 Dysphagia, unspecified: Secondary | ICD-10-CM | POA: Diagnosis not present

## 2020-05-26 DIAGNOSIS — I130 Hypertensive heart and chronic kidney disease with heart failure and stage 1 through stage 4 chronic kidney disease, or unspecified chronic kidney disease: Secondary | ICD-10-CM | POA: Diagnosis not present

## 2020-05-26 DIAGNOSIS — M8008XD Age-related osteoporosis with current pathological fracture, vertebra(e), subsequent encounter for fracture with routine healing: Secondary | ICD-10-CM | POA: Diagnosis not present

## 2020-05-26 DIAGNOSIS — J849 Interstitial pulmonary disease, unspecified: Secondary | ICD-10-CM | POA: Diagnosis not present

## 2020-05-26 DIAGNOSIS — I5033 Acute on chronic diastolic (congestive) heart failure: Secondary | ICD-10-CM | POA: Diagnosis not present

## 2020-05-26 DIAGNOSIS — E785 Hyperlipidemia, unspecified: Secondary | ICD-10-CM | POA: Diagnosis not present

## 2020-05-26 DIAGNOSIS — K2270 Barrett's esophagus without dysplasia: Secondary | ICD-10-CM | POA: Diagnosis not present

## 2020-05-26 DIAGNOSIS — Z86718 Personal history of other venous thrombosis and embolism: Secondary | ICD-10-CM | POA: Diagnosis not present

## 2020-05-26 DIAGNOSIS — E876 Hypokalemia: Secondary | ICD-10-CM | POA: Diagnosis not present

## 2020-05-27 DIAGNOSIS — N1832 Chronic kidney disease, stage 3b: Secondary | ICD-10-CM | POA: Diagnosis not present

## 2020-05-27 DIAGNOSIS — Z7901 Long term (current) use of anticoagulants: Secondary | ICD-10-CM | POA: Diagnosis not present

## 2020-05-27 DIAGNOSIS — I130 Hypertensive heart and chronic kidney disease with heart failure and stage 1 through stage 4 chronic kidney disease, or unspecified chronic kidney disease: Secondary | ICD-10-CM | POA: Diagnosis not present

## 2020-05-27 DIAGNOSIS — L89321 Pressure ulcer of left buttock, stage 1: Secondary | ICD-10-CM | POA: Diagnosis not present

## 2020-05-27 DIAGNOSIS — K2270 Barrett's esophagus without dysplasia: Secondary | ICD-10-CM | POA: Diagnosis not present

## 2020-05-27 DIAGNOSIS — I5033 Acute on chronic diastolic (congestive) heart failure: Secondary | ICD-10-CM | POA: Diagnosis not present

## 2020-05-27 DIAGNOSIS — M8008XD Age-related osteoporosis with current pathological fracture, vertebra(e), subsequent encounter for fracture with routine healing: Secondary | ICD-10-CM | POA: Diagnosis not present

## 2020-05-27 DIAGNOSIS — J849 Interstitial pulmonary disease, unspecified: Secondary | ICD-10-CM | POA: Diagnosis not present

## 2020-05-27 DIAGNOSIS — R1310 Dysphagia, unspecified: Secondary | ICD-10-CM | POA: Diagnosis not present

## 2020-05-27 DIAGNOSIS — E785 Hyperlipidemia, unspecified: Secondary | ICD-10-CM | POA: Diagnosis not present

## 2020-05-27 DIAGNOSIS — I4891 Unspecified atrial fibrillation: Secondary | ICD-10-CM | POA: Diagnosis not present

## 2020-05-27 DIAGNOSIS — E876 Hypokalemia: Secondary | ICD-10-CM | POA: Diagnosis not present

## 2020-05-27 DIAGNOSIS — I700 Atherosclerosis of aorta: Secondary | ICD-10-CM | POA: Diagnosis not present

## 2020-05-27 DIAGNOSIS — Z86718 Personal history of other venous thrombosis and embolism: Secondary | ICD-10-CM | POA: Diagnosis not present

## 2020-05-27 DIAGNOSIS — D631 Anemia in chronic kidney disease: Secondary | ICD-10-CM | POA: Diagnosis not present

## 2020-05-27 DIAGNOSIS — I272 Pulmonary hypertension, unspecified: Secondary | ICD-10-CM | POA: Diagnosis not present

## 2020-05-27 DIAGNOSIS — H409 Unspecified glaucoma: Secondary | ICD-10-CM | POA: Diagnosis not present

## 2020-05-28 ENCOUNTER — Telehealth: Payer: Self-pay

## 2020-05-28 DIAGNOSIS — I509 Heart failure, unspecified: Secondary | ICD-10-CM | POA: Diagnosis not present

## 2020-05-28 DIAGNOSIS — I4891 Unspecified atrial fibrillation: Secondary | ICD-10-CM | POA: Diagnosis not present

## 2020-05-28 NOTE — Telephone Encounter (Signed)
WRONG INFORMATION ABOVE REFERRAL WAS SENT [facility]

## 2020-05-28 NOTE — Telephone Encounter (Signed)
NOTES ON FILE FROM [facility] [phone] SENT REFERRAL TO SCHEDULING

## 2020-05-28 NOTE — Telephone Encounter (Signed)
REFERRAL ONLY FROM  [facility] [phone] SENT REFERRAL TO [place]

## 2020-05-29 DIAGNOSIS — I4891 Unspecified atrial fibrillation: Secondary | ICD-10-CM | POA: Diagnosis not present

## 2020-05-29 DIAGNOSIS — Z86718 Personal history of other venous thrombosis and embolism: Secondary | ICD-10-CM | POA: Diagnosis not present

## 2020-05-29 DIAGNOSIS — I700 Atherosclerosis of aorta: Secondary | ICD-10-CM | POA: Diagnosis not present

## 2020-05-29 DIAGNOSIS — I272 Pulmonary hypertension, unspecified: Secondary | ICD-10-CM | POA: Diagnosis not present

## 2020-05-29 DIAGNOSIS — N1832 Chronic kidney disease, stage 3b: Secondary | ICD-10-CM | POA: Diagnosis not present

## 2020-05-29 DIAGNOSIS — K2270 Barrett's esophagus without dysplasia: Secondary | ICD-10-CM | POA: Diagnosis not present

## 2020-05-29 DIAGNOSIS — Z7901 Long term (current) use of anticoagulants: Secondary | ICD-10-CM | POA: Diagnosis not present

## 2020-05-29 DIAGNOSIS — M8008XD Age-related osteoporosis with current pathological fracture, vertebra(e), subsequent encounter for fracture with routine healing: Secondary | ICD-10-CM | POA: Diagnosis not present

## 2020-05-29 DIAGNOSIS — H409 Unspecified glaucoma: Secondary | ICD-10-CM | POA: Diagnosis not present

## 2020-05-29 DIAGNOSIS — R1310 Dysphagia, unspecified: Secondary | ICD-10-CM | POA: Diagnosis not present

## 2020-05-29 DIAGNOSIS — L89321 Pressure ulcer of left buttock, stage 1: Secondary | ICD-10-CM | POA: Diagnosis not present

## 2020-05-29 DIAGNOSIS — I5033 Acute on chronic diastolic (congestive) heart failure: Secondary | ICD-10-CM | POA: Diagnosis not present

## 2020-05-29 DIAGNOSIS — E785 Hyperlipidemia, unspecified: Secondary | ICD-10-CM | POA: Diagnosis not present

## 2020-05-29 DIAGNOSIS — D631 Anemia in chronic kidney disease: Secondary | ICD-10-CM | POA: Diagnosis not present

## 2020-05-29 DIAGNOSIS — E876 Hypokalemia: Secondary | ICD-10-CM | POA: Diagnosis not present

## 2020-05-29 DIAGNOSIS — I130 Hypertensive heart and chronic kidney disease with heart failure and stage 1 through stage 4 chronic kidney disease, or unspecified chronic kidney disease: Secondary | ICD-10-CM | POA: Diagnosis not present

## 2020-05-29 DIAGNOSIS — J849 Interstitial pulmonary disease, unspecified: Secondary | ICD-10-CM | POA: Diagnosis not present

## 2020-06-03 ENCOUNTER — Encounter (INDEPENDENT_AMBULATORY_CARE_PROVIDER_SITE_OTHER): Payer: Medicare Other | Admitting: Ophthalmology

## 2020-06-04 DIAGNOSIS — I130 Hypertensive heart and chronic kidney disease with heart failure and stage 1 through stage 4 chronic kidney disease, or unspecified chronic kidney disease: Secondary | ICD-10-CM | POA: Diagnosis not present

## 2020-06-04 DIAGNOSIS — M8008XD Age-related osteoporosis with current pathological fracture, vertebra(e), subsequent encounter for fracture with routine healing: Secondary | ICD-10-CM | POA: Diagnosis not present

## 2020-06-04 DIAGNOSIS — I272 Pulmonary hypertension, unspecified: Secondary | ICD-10-CM | POA: Diagnosis not present

## 2020-06-04 DIAGNOSIS — Z86718 Personal history of other venous thrombosis and embolism: Secondary | ICD-10-CM | POA: Diagnosis not present

## 2020-06-04 DIAGNOSIS — D631 Anemia in chronic kidney disease: Secondary | ICD-10-CM | POA: Diagnosis not present

## 2020-06-04 DIAGNOSIS — H409 Unspecified glaucoma: Secondary | ICD-10-CM | POA: Diagnosis not present

## 2020-06-04 DIAGNOSIS — J849 Interstitial pulmonary disease, unspecified: Secondary | ICD-10-CM | POA: Diagnosis not present

## 2020-06-04 DIAGNOSIS — N1832 Chronic kidney disease, stage 3b: Secondary | ICD-10-CM | POA: Diagnosis not present

## 2020-06-04 DIAGNOSIS — I4891 Unspecified atrial fibrillation: Secondary | ICD-10-CM | POA: Diagnosis not present

## 2020-06-04 DIAGNOSIS — Z7901 Long term (current) use of anticoagulants: Secondary | ICD-10-CM | POA: Diagnosis not present

## 2020-06-04 DIAGNOSIS — R1310 Dysphagia, unspecified: Secondary | ICD-10-CM | POA: Diagnosis not present

## 2020-06-04 DIAGNOSIS — E785 Hyperlipidemia, unspecified: Secondary | ICD-10-CM | POA: Diagnosis not present

## 2020-06-04 DIAGNOSIS — I5033 Acute on chronic diastolic (congestive) heart failure: Secondary | ICD-10-CM | POA: Diagnosis not present

## 2020-06-04 DIAGNOSIS — K2270 Barrett's esophagus without dysplasia: Secondary | ICD-10-CM | POA: Diagnosis not present

## 2020-06-04 DIAGNOSIS — L89321 Pressure ulcer of left buttock, stage 1: Secondary | ICD-10-CM | POA: Diagnosis not present

## 2020-06-04 DIAGNOSIS — E876 Hypokalemia: Secondary | ICD-10-CM | POA: Diagnosis not present

## 2020-06-04 DIAGNOSIS — I700 Atherosclerosis of aorta: Secondary | ICD-10-CM | POA: Diagnosis not present

## 2020-06-06 DIAGNOSIS — I700 Atherosclerosis of aorta: Secondary | ICD-10-CM | POA: Diagnosis not present

## 2020-06-06 DIAGNOSIS — K2270 Barrett's esophagus without dysplasia: Secondary | ICD-10-CM | POA: Diagnosis not present

## 2020-06-06 DIAGNOSIS — J849 Interstitial pulmonary disease, unspecified: Secondary | ICD-10-CM | POA: Diagnosis not present

## 2020-06-06 DIAGNOSIS — N1832 Chronic kidney disease, stage 3b: Secondary | ICD-10-CM | POA: Diagnosis not present

## 2020-06-06 DIAGNOSIS — D631 Anemia in chronic kidney disease: Secondary | ICD-10-CM | POA: Diagnosis not present

## 2020-06-06 DIAGNOSIS — I5033 Acute on chronic diastolic (congestive) heart failure: Secondary | ICD-10-CM | POA: Diagnosis not present

## 2020-06-06 DIAGNOSIS — Z86718 Personal history of other venous thrombosis and embolism: Secondary | ICD-10-CM | POA: Diagnosis not present

## 2020-06-06 DIAGNOSIS — E876 Hypokalemia: Secondary | ICD-10-CM | POA: Diagnosis not present

## 2020-06-06 DIAGNOSIS — E785 Hyperlipidemia, unspecified: Secondary | ICD-10-CM | POA: Diagnosis not present

## 2020-06-06 DIAGNOSIS — Z7901 Long term (current) use of anticoagulants: Secondary | ICD-10-CM | POA: Diagnosis not present

## 2020-06-06 DIAGNOSIS — H409 Unspecified glaucoma: Secondary | ICD-10-CM | POA: Diagnosis not present

## 2020-06-06 DIAGNOSIS — I272 Pulmonary hypertension, unspecified: Secondary | ICD-10-CM | POA: Diagnosis not present

## 2020-06-06 DIAGNOSIS — L89321 Pressure ulcer of left buttock, stage 1: Secondary | ICD-10-CM | POA: Diagnosis not present

## 2020-06-06 DIAGNOSIS — I4891 Unspecified atrial fibrillation: Secondary | ICD-10-CM | POA: Diagnosis not present

## 2020-06-06 DIAGNOSIS — I130 Hypertensive heart and chronic kidney disease with heart failure and stage 1 through stage 4 chronic kidney disease, or unspecified chronic kidney disease: Secondary | ICD-10-CM | POA: Diagnosis not present

## 2020-06-06 DIAGNOSIS — R1310 Dysphagia, unspecified: Secondary | ICD-10-CM | POA: Diagnosis not present

## 2020-06-06 DIAGNOSIS — M8008XD Age-related osteoporosis with current pathological fracture, vertebra(e), subsequent encounter for fracture with routine healing: Secondary | ICD-10-CM | POA: Diagnosis not present

## 2020-06-08 DIAGNOSIS — I272 Pulmonary hypertension, unspecified: Secondary | ICD-10-CM | POA: Diagnosis not present

## 2020-06-10 DIAGNOSIS — I4891 Unspecified atrial fibrillation: Secondary | ICD-10-CM | POA: Diagnosis not present

## 2020-06-10 DIAGNOSIS — I5033 Acute on chronic diastolic (congestive) heart failure: Secondary | ICD-10-CM | POA: Diagnosis not present

## 2020-06-10 DIAGNOSIS — E876 Hypokalemia: Secondary | ICD-10-CM | POA: Diagnosis not present

## 2020-06-10 DIAGNOSIS — I700 Atherosclerosis of aorta: Secondary | ICD-10-CM | POA: Diagnosis not present

## 2020-06-10 DIAGNOSIS — D631 Anemia in chronic kidney disease: Secondary | ICD-10-CM | POA: Diagnosis not present

## 2020-06-10 DIAGNOSIS — L89321 Pressure ulcer of left buttock, stage 1: Secondary | ICD-10-CM | POA: Diagnosis not present

## 2020-06-10 DIAGNOSIS — Z86718 Personal history of other venous thrombosis and embolism: Secondary | ICD-10-CM | POA: Diagnosis not present

## 2020-06-10 DIAGNOSIS — K2270 Barrett's esophagus without dysplasia: Secondary | ICD-10-CM | POA: Diagnosis not present

## 2020-06-10 DIAGNOSIS — I130 Hypertensive heart and chronic kidney disease with heart failure and stage 1 through stage 4 chronic kidney disease, or unspecified chronic kidney disease: Secondary | ICD-10-CM | POA: Diagnosis not present

## 2020-06-10 DIAGNOSIS — H409 Unspecified glaucoma: Secondary | ICD-10-CM | POA: Diagnosis not present

## 2020-06-10 DIAGNOSIS — R1310 Dysphagia, unspecified: Secondary | ICD-10-CM | POA: Diagnosis not present

## 2020-06-10 DIAGNOSIS — E785 Hyperlipidemia, unspecified: Secondary | ICD-10-CM | POA: Diagnosis not present

## 2020-06-10 DIAGNOSIS — J849 Interstitial pulmonary disease, unspecified: Secondary | ICD-10-CM | POA: Diagnosis not present

## 2020-06-10 DIAGNOSIS — I272 Pulmonary hypertension, unspecified: Secondary | ICD-10-CM | POA: Diagnosis not present

## 2020-06-10 DIAGNOSIS — N1832 Chronic kidney disease, stage 3b: Secondary | ICD-10-CM | POA: Diagnosis not present

## 2020-06-10 DIAGNOSIS — M8008XD Age-related osteoporosis with current pathological fracture, vertebra(e), subsequent encounter for fracture with routine healing: Secondary | ICD-10-CM | POA: Diagnosis not present

## 2020-06-10 DIAGNOSIS — Z7901 Long term (current) use of anticoagulants: Secondary | ICD-10-CM | POA: Diagnosis not present

## 2020-06-12 DIAGNOSIS — I130 Hypertensive heart and chronic kidney disease with heart failure and stage 1 through stage 4 chronic kidney disease, or unspecified chronic kidney disease: Secondary | ICD-10-CM | POA: Diagnosis not present

## 2020-06-12 DIAGNOSIS — I5033 Acute on chronic diastolic (congestive) heart failure: Secondary | ICD-10-CM | POA: Diagnosis not present

## 2020-06-12 DIAGNOSIS — N1832 Chronic kidney disease, stage 3b: Secondary | ICD-10-CM | POA: Diagnosis not present

## 2020-06-12 DIAGNOSIS — D631 Anemia in chronic kidney disease: Secondary | ICD-10-CM | POA: Diagnosis not present

## 2020-06-16 ENCOUNTER — Encounter (INDEPENDENT_AMBULATORY_CARE_PROVIDER_SITE_OTHER): Payer: Self-pay | Admitting: Ophthalmology

## 2020-06-16 ENCOUNTER — Ambulatory Visit (INDEPENDENT_AMBULATORY_CARE_PROVIDER_SITE_OTHER): Payer: Medicare Other | Admitting: Ophthalmology

## 2020-06-16 ENCOUNTER — Other Ambulatory Visit: Payer: Self-pay

## 2020-06-16 DIAGNOSIS — H34832 Tributary (branch) retinal vein occlusion, left eye, with macular edema: Secondary | ICD-10-CM

## 2020-06-16 MED ORDER — BEVACIZUMAB CHEMO INJECTION 1.25MG/0.05ML SYRINGE FOR KALEIDOSCOPE
1.2500 mg | INTRAVITREAL | Status: AC | PRN
  Administered 2020-06-16: 1.25 mg via INTRAVITREAL

## 2020-06-16 NOTE — Assessment & Plan Note (Signed)
,   improved CME yet still active at 10-week follow-up we will repeat injection OS today and examination in 3 months

## 2020-06-16 NOTE — Progress Notes (Signed)
06/16/2020     CHIEF COMPLAINT Patient presents for Retina Follow Up   HISTORY OF PRESENT ILLNESS: Kelli Peterson is a 86 y.o. female who presents to the clinic today for:   HPI    Retina Follow Up    Patient presents with  CRVO/BRVO.  In left eye.  This started 10 weeks ago.  Severity is mild.  Duration of 10 weeks.  Since onset it is stable.          Comments    10 Week BRVO F/U OS, poss Avastin OS  Pt denies noticeable changes to [place] OU since last visit. Pt denies ocular pain, flashes of light, or floaters OU.         Last edited by Rockie Neighbours, [place] on 06/16/2020  9:33 AM. (History)      Referring physician: Jani Gravel, MD [address] [place] [place],  [place] [address]  HISTORICAL INFORMATION:   Selected notes from the MEDICAL RECORD NUMBER       CURRENT MEDICATIONS: Current Outpatient Medications (Ophthalmic Drugs)  Medication Sig  . brimonidine (ALPHAGAN) 0.2 % ophthalmic solution Place 1 drop into the right eye 2 (two) times daily.  . dorzolamide (TRUSOPT) 2 % ophthalmic solution Place 1 drop into both eyes 2 (two) times daily.   Marland Kitchen latanoprost (XALATAN) 0.005 % ophthalmic solution Place 1 drop into both eyes daily at 12 noon.   Mckinley Jewel Dimesylate (RHOPRESSA) 0.02 % SOLN Place 1 drop into the right eye daily.    No current facility-administered medications for this visit. (Ophthalmic Drugs)   Current Outpatient Medications (Other)  Medication Sig  . acetaminophen (TYLENOL) 500 MG tablet Take 500 mg by mouth every 6 (six) hours as needed for pain.  Marland Kitchen apixaban (ELIQUIS) 2.5 MG TABS tablet Take 2.5 mg by mouth 2 (two) times daily.  Marland Kitchen atorvastatin (LIPITOR) 20 MG tablet Take 20 mg by mouth daily.  Marland Kitchen denosumab (PROLIA) 60 MG/ML SOSY injection Inject 60 mg into the skin every 6 (six) months.  . diltiazem (CARDIZEM CD) 240 MG 24 hr capsule Take 1 capsule (240 mg total) by mouth daily.  Marland Kitchen guaiFENesin (MUCINEX) 600 MG 12 hr tablet Take 600 mg by  mouth 2 (two) times daily as needed for to loosen phlegm.  . metoprolol succinate (TOPROL-XL) 25 MG 24 hr tablet Take 3 tablets (75 mg total) by mouth daily.  . Multiple Vitamin (MULTIVITAMIN WITH MINERALS) TABS tablet Take 1 tablet by mouth daily.  . potassium chloride (KLOR-CON) 10 MEQ tablet Take 1 tablet (10 mEq total) by mouth daily.  Marland Kitchen torsemide (DEMADEX) 20 MG tablet Take 1 tablet (20 mg total) by mouth 2 (two) times daily.   No current facility-administered medications for this visit. (Other)      REVIEW OF SYSTEMS:    ALLERGIES Allergies  Allergen Reactions  . Penicillins Swelling and Other (See Comments)    Ankles and feet swell  . Zocor [Simvastatin] Other (See Comments)    Weak and dizzy  . Nsaids Other (See Comments)    Tylenol is all that is permitted    PAST MEDICAL HISTORY Past Medical History:  Diagnosis Date  . Adenomatous colon polyp 2007  . Atrial fibrillation ([place])   . Barrett's esophagus 2007  . Cerebellar hemorrhage ([place])   . Cervical cancer ([place])   . CHF (congestive heart failure) ([place])   . Closed pelvic fracture ([place])   . CVD (cardiovascular disease)   . DVT (deep venous thrombosis) (  [place])   . Fibrocystic breast disease   . Fracture of pubic ramus ([place])   . Hyperlipidemia   . Hypertension   . Iron deficiency anemia    Past Surgical History:  Procedure Laterality Date  . CATARACT EXTRACTION Right 02/15/2010   [facility]  . CATARACT EXTRACTION Left 01/30/2010   [facility]  . CATARACT EXTRACTION, BILATERAL  2010  . TOTAL ABDOMINAL HYSTERECTOMY  1964    FAMILY HISTORY Family History  Problem Relation Age of Onset  . CVA Father   . CVA Mother     SOCIAL HISTORY Social History   Tobacco Use  . Smoking status: Never Smoker  . Smokeless tobacco: Never Used  Substance Use Topics  . Alcohol use: No  . Drug use: No         OPHTHALMIC EXAM: Base Eye Exam    Visual Acuity (ETDRS)      Right Left   Dist [place] 20/60 20/100 -1   Dist ph [place]  20/40 -2 20/70 -2       Tonometry (Tonopen, 9:36 AM)      Right Left   Pressure 11 17       Pupils      Pupils Dark Light Shape React APD   Right PERRL 4 3 Round Brisk None   Left PERRL 4 3 Round Brisk None       Visual Fields (Counting fingers)      Left Right    Full Full       Extraocular Movement      Right Left    Full Full       Neuro/Psych    Oriented x3: Yes   Mood/Affect: Normal       Dilation    Left eye: 1.0% Mydriacyl, 2.5% Phenylephrine @ 9:40 AM        Slit Lamp and Fundus Exam    External Exam      Right Left   External Normal Normal       Slit Lamp Exam      Right Left   Lids/Lashes Normal Normal   Conjunctiva/Sclera White and quiet White and quiet   Cornea Clear Clear   Anterior Chamber Deep and quiet Deep and quiet   Iris Round and reactive Round and reactive   Lens Posterior chamber intraocular lens Posterior chamber intraocular lens   Anterior Vitreous Normal Normal       Fundus Exam      Right Left   Posterior Vitreous  Posterior vitreous detachment   Disc  1+ Pallor,, with collaterals on the superior pole of the nerve   C/D Ratio  0.8   Macula  Intraretinal hemorrhage, Cystoid macular edema, Macular thickening   Vessels  Superior hemicentral retinal vein occlusion left eye, still active   Periphery  Normal          IMAGING AND PROCEDURES  Imaging and Procedures for 06/16/20  OCT, Retina - OU - Both Eyes       Right Eye Quality was borderline. Scan locations included subfoveal. Progression has been stable.   Left Eye Quality was borderline. Progression has been stable. Findings include cystoid macular edema.   Notes OD, no active CME, central foveal scarring in the retina, no change over time.  OS with improved CME stable at 10-week follow-up interval, will repeat injection intravitreal Avastin OS today And examination OS in 3 months       Intravitreal Injection, Pharmacologic Agent - OS - Left Eye  Time  Out 06/16/2020. 10:42 AM. Confirmed correct patient, procedure, site, and patient consented.   Anesthesia Topical anesthesia was used. Anesthetic medications included [place] 3.5%.   Procedure Preparation included 5% betadine to ocular surface, 10% betadine to eyelids, Tobramycin 0.3%, Ofloxacin . A supplied needle was used.   Injection:  1.25 mg Bevacizumab (AVASTIN) SOLN   NDC: [address]-6, Lot: [id number]   Route: Intravitreal, Site: Left Eye, Waste: 0 mg  Post-op Post injection exam found visual acuity of at least counting fingers. The patient tolerated the procedure well. There were no complications. The patient received written and verbal post procedure care education. Post injection medications were not given.                 ASSESSMENT/PLAN:  Branch retinal vein occlusion with macular edema of left eye , improved CME yet still active at 10-week follow-up we will repeat injection OS today and examination in 3 months      ICD-10-CM   1. Branch retinal vein occlusion with macular edema of left eye  H34.8320 OCT, Retina - OU - Both Eyes    Intravitreal Injection, Pharmacologic Agent - OS - Left Eye    Bevacizumab (AVASTIN) SOLN 1.25 mg    1.  OS, BRVO with CME, improved and stable at 10-week interval.  We will repeat injection today  2.  Reevaluate OU in 3 months, with particular attention to the left eye possible injection Avastin at that time  3.  Ophthalmic Meds Ordered this visit:  Meds ordered this encounter  Medications  . Bevacizumab (AVASTIN) SOLN 1.25 mg       Return in about 3 months (around 09/15/2020) for DILATE OU, AVASTIN OCT, OS.  There are no Patient Instructions on file for this visit.   Explained the diagnoses, plan, and follow up with the patient and they expressed understanding.  Patient expressed understanding of the importance of proper follow up care.   Clent Demark Trino Higinbotham M.D. Diseases & Surgery of the Retina and Vitreous Retina & Diabetic  [facility] 06/16/20     Abbreviations: M myopia (nearsighted); A astigmatism; H hyperopia (farsighted); P presbyopia; Mrx spectacle prescription;  CTL contact lenses; OD right eye; OS left eye; OU both eyes  XT exotropia; ET esotropia; PEK punctate epithelial keratitis; PEE punctate epithelial erosions; DES dry eye syndrome; MGD meibomian gland dysfunction; ATs artificial tears; PFAT's preservative free artificial tears; [place] nuclear sclerotic cataract; PSC posterior subcapsular cataract; ERM epi-retinal membrane; PVD posterior vitreous detachment; RD retinal detachment; DM diabetes mellitus; DR diabetic retinopathy; NPDR non-proliferative diabetic retinopathy; PDR proliferative diabetic retinopathy; CSME clinically significant macular edema; DME diabetic macular edema; dbh dot blot hemorrhages; CWS cotton wool spot; POAG primary open angle glaucoma; C/D cup-to-disc ratio; HVF humphrey visual field; GVF goldmann visual field; OCT optical coherence tomography; IOP intraocular pressure; BRVO Branch retinal vein occlusion; CRVO central retinal vein occlusion; CRAO central retinal artery occlusion; BRAO branch retinal artery occlusion; RT retinal tear; SB scleral buckle; PPV pars plana vitrectomy; VH Vitreous hemorrhage; PRP panretinal laser photocoagulation; IVK intravitreal kenalog; VMT vitreomacular traction; MH Macular hole;  NVD neovascularization of the disc; NVE neovascularization elsewhere; AREDS age related eye disease study; ARMD age related macular degeneration; POAG primary open angle glaucoma; EBMD epithelial/anterior basement membrane dystrophy; ACIOL anterior chamber intraocular lens; IOL intraocular lens; PCIOL posterior chamber intraocular lens; Phaco/IOL phacoemulsification with intraocular lens placement; [place] photorefractive keratectomy; LASIK laser assisted in situ keratomileusis; HTN hypertension; DM diabetes mellitus; COPD chronic obstructive pulmonary disease

## 2020-06-16 NOTE — Patient Instructions (Signed)
Patient asked to report any new onset visual acuity declines or distortions in either eye

## 2020-06-18 DIAGNOSIS — R1310 Dysphagia, unspecified: Secondary | ICD-10-CM | POA: Diagnosis not present

## 2020-06-18 DIAGNOSIS — I700 Atherosclerosis of aorta: Secondary | ICD-10-CM | POA: Diagnosis not present

## 2020-06-18 DIAGNOSIS — M8008XD Age-related osteoporosis with current pathological fracture, vertebra(e), subsequent encounter for fracture with routine healing: Secondary | ICD-10-CM | POA: Diagnosis not present

## 2020-06-18 DIAGNOSIS — I272 Pulmonary hypertension, unspecified: Secondary | ICD-10-CM | POA: Diagnosis not present

## 2020-06-18 DIAGNOSIS — I4891 Unspecified atrial fibrillation: Secondary | ICD-10-CM | POA: Diagnosis not present

## 2020-06-18 DIAGNOSIS — Z86718 Personal history of other venous thrombosis and embolism: Secondary | ICD-10-CM | POA: Diagnosis not present

## 2020-06-18 DIAGNOSIS — N1832 Chronic kidney disease, stage 3b: Secondary | ICD-10-CM | POA: Diagnosis not present

## 2020-06-18 DIAGNOSIS — K2270 Barrett's esophagus without dysplasia: Secondary | ICD-10-CM | POA: Diagnosis not present

## 2020-06-18 DIAGNOSIS — I130 Hypertensive heart and chronic kidney disease with heart failure and stage 1 through stage 4 chronic kidney disease, or unspecified chronic kidney disease: Secondary | ICD-10-CM | POA: Diagnosis not present

## 2020-06-18 DIAGNOSIS — D631 Anemia in chronic kidney disease: Secondary | ICD-10-CM | POA: Diagnosis not present

## 2020-06-18 DIAGNOSIS — E876 Hypokalemia: Secondary | ICD-10-CM | POA: Diagnosis not present

## 2020-06-18 DIAGNOSIS — J849 Interstitial pulmonary disease, unspecified: Secondary | ICD-10-CM | POA: Diagnosis not present

## 2020-06-18 DIAGNOSIS — Z7901 Long term (current) use of anticoagulants: Secondary | ICD-10-CM | POA: Diagnosis not present

## 2020-06-18 DIAGNOSIS — E785 Hyperlipidemia, unspecified: Secondary | ICD-10-CM | POA: Diagnosis not present

## 2020-06-18 DIAGNOSIS — I5033 Acute on chronic diastolic (congestive) heart failure: Secondary | ICD-10-CM | POA: Diagnosis not present

## 2020-06-18 DIAGNOSIS — H409 Unspecified glaucoma: Secondary | ICD-10-CM | POA: Diagnosis not present

## 2020-06-18 DIAGNOSIS — L89321 Pressure ulcer of left buttock, stage 1: Secondary | ICD-10-CM | POA: Diagnosis not present

## 2020-06-24 DIAGNOSIS — I130 Hypertensive heart and chronic kidney disease with heart failure and stage 1 through stage 4 chronic kidney disease, or unspecified chronic kidney disease: Secondary | ICD-10-CM | POA: Diagnosis not present

## 2020-06-24 DIAGNOSIS — I5033 Acute on chronic diastolic (congestive) heart failure: Secondary | ICD-10-CM | POA: Diagnosis not present

## 2020-06-24 DIAGNOSIS — I4891 Unspecified atrial fibrillation: Secondary | ICD-10-CM | POA: Diagnosis not present

## 2020-06-24 DIAGNOSIS — E876 Hypokalemia: Secondary | ICD-10-CM | POA: Diagnosis not present

## 2020-06-24 DIAGNOSIS — J849 Interstitial pulmonary disease, unspecified: Secondary | ICD-10-CM | POA: Diagnosis not present

## 2020-06-24 DIAGNOSIS — I700 Atherosclerosis of aorta: Secondary | ICD-10-CM | POA: Diagnosis not present

## 2020-06-24 DIAGNOSIS — K2270 Barrett's esophagus without dysplasia: Secondary | ICD-10-CM | POA: Diagnosis not present

## 2020-06-24 DIAGNOSIS — H409 Unspecified glaucoma: Secondary | ICD-10-CM | POA: Diagnosis not present

## 2020-06-24 DIAGNOSIS — I272 Pulmonary hypertension, unspecified: Secondary | ICD-10-CM | POA: Diagnosis not present

## 2020-06-24 DIAGNOSIS — R1310 Dysphagia, unspecified: Secondary | ICD-10-CM | POA: Diagnosis not present

## 2020-06-24 DIAGNOSIS — Z86718 Personal history of other venous thrombosis and embolism: Secondary | ICD-10-CM | POA: Diagnosis not present

## 2020-06-24 DIAGNOSIS — E785 Hyperlipidemia, unspecified: Secondary | ICD-10-CM | POA: Diagnosis not present

## 2020-06-24 DIAGNOSIS — N1832 Chronic kidney disease, stage 3b: Secondary | ICD-10-CM | POA: Diagnosis not present

## 2020-06-24 DIAGNOSIS — D631 Anemia in chronic kidney disease: Secondary | ICD-10-CM | POA: Diagnosis not present

## 2020-06-24 DIAGNOSIS — Z7901 Long term (current) use of anticoagulants: Secondary | ICD-10-CM | POA: Diagnosis not present

## 2020-06-24 DIAGNOSIS — M8008XD Age-related osteoporosis with current pathological fracture, vertebra(e), subsequent encounter for fracture with routine healing: Secondary | ICD-10-CM | POA: Diagnosis not present

## 2020-06-24 DIAGNOSIS — L89321 Pressure ulcer of left buttock, stage 1: Secondary | ICD-10-CM | POA: Diagnosis not present

## 2020-07-08 DIAGNOSIS — I272 Pulmonary hypertension, unspecified: Secondary | ICD-10-CM | POA: Diagnosis not present

## 2020-07-08 DIAGNOSIS — L89321 Pressure ulcer of left buttock, stage 1: Secondary | ICD-10-CM | POA: Diagnosis not present

## 2020-07-08 DIAGNOSIS — M8008XD Age-related osteoporosis with current pathological fracture, vertebra(e), subsequent encounter for fracture with routine healing: Secondary | ICD-10-CM | POA: Diagnosis not present

## 2020-07-08 DIAGNOSIS — R1310 Dysphagia, unspecified: Secondary | ICD-10-CM | POA: Diagnosis not present

## 2020-07-08 DIAGNOSIS — J849 Interstitial pulmonary disease, unspecified: Secondary | ICD-10-CM | POA: Diagnosis not present

## 2020-07-08 DIAGNOSIS — E785 Hyperlipidemia, unspecified: Secondary | ICD-10-CM | POA: Diagnosis not present

## 2020-07-08 DIAGNOSIS — Z86718 Personal history of other venous thrombosis and embolism: Secondary | ICD-10-CM | POA: Diagnosis not present

## 2020-07-08 DIAGNOSIS — N1832 Chronic kidney disease, stage 3b: Secondary | ICD-10-CM | POA: Diagnosis not present

## 2020-07-08 DIAGNOSIS — K2270 Barrett's esophagus without dysplasia: Secondary | ICD-10-CM | POA: Diagnosis not present

## 2020-07-08 DIAGNOSIS — D631 Anemia in chronic kidney disease: Secondary | ICD-10-CM | POA: Diagnosis not present

## 2020-07-08 DIAGNOSIS — I4891 Unspecified atrial fibrillation: Secondary | ICD-10-CM | POA: Diagnosis not present

## 2020-07-08 DIAGNOSIS — I700 Atherosclerosis of aorta: Secondary | ICD-10-CM | POA: Diagnosis not present

## 2020-07-08 DIAGNOSIS — H409 Unspecified glaucoma: Secondary | ICD-10-CM | POA: Diagnosis not present

## 2020-07-08 DIAGNOSIS — I5033 Acute on chronic diastolic (congestive) heart failure: Secondary | ICD-10-CM | POA: Diagnosis not present

## 2020-07-08 DIAGNOSIS — E876 Hypokalemia: Secondary | ICD-10-CM | POA: Diagnosis not present

## 2020-07-08 DIAGNOSIS — Z7901 Long term (current) use of anticoagulants: Secondary | ICD-10-CM | POA: Diagnosis not present

## 2020-07-08 DIAGNOSIS — I130 Hypertensive heart and chronic kidney disease with heart failure and stage 1 through stage 4 chronic kidney disease, or unspecified chronic kidney disease: Secondary | ICD-10-CM | POA: Diagnosis not present

## 2020-07-17 DIAGNOSIS — H401113 Primary open-angle glaucoma, right eye, severe stage: Secondary | ICD-10-CM | POA: Diagnosis not present

## 2020-07-17 DIAGNOSIS — H401122 Primary open-angle glaucoma, left eye, moderate stage: Secondary | ICD-10-CM | POA: Diagnosis not present

## 2020-07-17 DIAGNOSIS — H04123 Dry eye syndrome of bilateral lacrimal glands: Secondary | ICD-10-CM | POA: Diagnosis not present

## 2020-07-17 DIAGNOSIS — H353132 Nonexudative age-related macular degeneration, bilateral, intermediate dry stage: Secondary | ICD-10-CM | POA: Diagnosis not present

## 2020-07-24 DIAGNOSIS — N1832 Chronic kidney disease, stage 3b: Secondary | ICD-10-CM | POA: Diagnosis not present

## 2020-07-24 DIAGNOSIS — Z7901 Long term (current) use of anticoagulants: Secondary | ICD-10-CM | POA: Diagnosis not present

## 2020-07-24 DIAGNOSIS — I130 Hypertensive heart and chronic kidney disease with heart failure and stage 1 through stage 4 chronic kidney disease, or unspecified chronic kidney disease: Secondary | ICD-10-CM | POA: Diagnosis not present

## 2020-07-24 DIAGNOSIS — L89321 Pressure ulcer of left buttock, stage 1: Secondary | ICD-10-CM | POA: Diagnosis not present

## 2020-07-24 DIAGNOSIS — M8008XD Age-related osteoporosis with current pathological fracture, vertebra(e), subsequent encounter for fracture with routine healing: Secondary | ICD-10-CM | POA: Diagnosis not present

## 2020-07-24 DIAGNOSIS — E876 Hypokalemia: Secondary | ICD-10-CM | POA: Diagnosis not present

## 2020-07-24 DIAGNOSIS — J849 Interstitial pulmonary disease, unspecified: Secondary | ICD-10-CM | POA: Diagnosis not present

## 2020-07-24 DIAGNOSIS — I4891 Unspecified atrial fibrillation: Secondary | ICD-10-CM | POA: Diagnosis not present

## 2020-07-24 DIAGNOSIS — H409 Unspecified glaucoma: Secondary | ICD-10-CM | POA: Diagnosis not present

## 2020-07-24 DIAGNOSIS — Z86718 Personal history of other venous thrombosis and embolism: Secondary | ICD-10-CM | POA: Diagnosis not present

## 2020-07-24 DIAGNOSIS — K2270 Barrett's esophagus without dysplasia: Secondary | ICD-10-CM | POA: Diagnosis not present

## 2020-07-24 DIAGNOSIS — R1310 Dysphagia, unspecified: Secondary | ICD-10-CM | POA: Diagnosis not present

## 2020-07-24 DIAGNOSIS — I272 Pulmonary hypertension, unspecified: Secondary | ICD-10-CM | POA: Diagnosis not present

## 2020-07-24 DIAGNOSIS — E785 Hyperlipidemia, unspecified: Secondary | ICD-10-CM | POA: Diagnosis not present

## 2020-07-24 DIAGNOSIS — I700 Atherosclerosis of aorta: Secondary | ICD-10-CM | POA: Diagnosis not present

## 2020-07-24 DIAGNOSIS — D631 Anemia in chronic kidney disease: Secondary | ICD-10-CM | POA: Diagnosis not present

## 2020-07-24 DIAGNOSIS — I5033 Acute on chronic diastolic (congestive) heart failure: Secondary | ICD-10-CM | POA: Diagnosis not present

## 2020-08-06 DIAGNOSIS — M8008XD Age-related osteoporosis with current pathological fracture, vertebra(e), subsequent encounter for fracture with routine healing: Secondary | ICD-10-CM | POA: Diagnosis not present

## 2020-08-06 DIAGNOSIS — J849 Interstitial pulmonary disease, unspecified: Secondary | ICD-10-CM | POA: Diagnosis not present

## 2020-08-06 DIAGNOSIS — I4891 Unspecified atrial fibrillation: Secondary | ICD-10-CM | POA: Diagnosis not present

## 2020-08-06 DIAGNOSIS — R1310 Dysphagia, unspecified: Secondary | ICD-10-CM | POA: Diagnosis not present

## 2020-08-06 DIAGNOSIS — D631 Anemia in chronic kidney disease: Secondary | ICD-10-CM | POA: Diagnosis not present

## 2020-08-06 DIAGNOSIS — I272 Pulmonary hypertension, unspecified: Secondary | ICD-10-CM | POA: Diagnosis not present

## 2020-08-06 DIAGNOSIS — I5033 Acute on chronic diastolic (congestive) heart failure: Secondary | ICD-10-CM | POA: Diagnosis not present

## 2020-08-06 DIAGNOSIS — H409 Unspecified glaucoma: Secondary | ICD-10-CM | POA: Diagnosis not present

## 2020-08-06 DIAGNOSIS — L89321 Pressure ulcer of left buttock, stage 1: Secondary | ICD-10-CM | POA: Diagnosis not present

## 2020-08-06 DIAGNOSIS — I700 Atherosclerosis of aorta: Secondary | ICD-10-CM | POA: Diagnosis not present

## 2020-08-06 DIAGNOSIS — E876 Hypokalemia: Secondary | ICD-10-CM | POA: Diagnosis not present

## 2020-08-06 DIAGNOSIS — N1832 Chronic kidney disease, stage 3b: Secondary | ICD-10-CM | POA: Diagnosis not present

## 2020-08-06 DIAGNOSIS — Z7901 Long term (current) use of anticoagulants: Secondary | ICD-10-CM | POA: Diagnosis not present

## 2020-08-06 DIAGNOSIS — E785 Hyperlipidemia, unspecified: Secondary | ICD-10-CM | POA: Diagnosis not present

## 2020-08-06 DIAGNOSIS — I130 Hypertensive heart and chronic kidney disease with heart failure and stage 1 through stage 4 chronic kidney disease, or unspecified chronic kidney disease: Secondary | ICD-10-CM | POA: Diagnosis not present

## 2020-08-06 DIAGNOSIS — Z86718 Personal history of other venous thrombosis and embolism: Secondary | ICD-10-CM | POA: Diagnosis not present

## 2020-08-06 DIAGNOSIS — K2270 Barrett's esophagus without dysplasia: Secondary | ICD-10-CM | POA: Diagnosis not present

## 2020-08-08 DIAGNOSIS — I272 Pulmonary hypertension, unspecified: Secondary | ICD-10-CM | POA: Diagnosis not present

## 2020-08-20 DIAGNOSIS — Z86718 Personal history of other venous thrombosis and embolism: Secondary | ICD-10-CM | POA: Diagnosis not present

## 2020-08-20 DIAGNOSIS — R1310 Dysphagia, unspecified: Secondary | ICD-10-CM | POA: Diagnosis not present

## 2020-08-20 DIAGNOSIS — I4891 Unspecified atrial fibrillation: Secondary | ICD-10-CM | POA: Diagnosis not present

## 2020-08-20 DIAGNOSIS — N1832 Chronic kidney disease, stage 3b: Secondary | ICD-10-CM | POA: Diagnosis not present

## 2020-08-20 DIAGNOSIS — J849 Interstitial pulmonary disease, unspecified: Secondary | ICD-10-CM | POA: Diagnosis not present

## 2020-08-20 DIAGNOSIS — I700 Atherosclerosis of aorta: Secondary | ICD-10-CM | POA: Diagnosis not present

## 2020-08-20 DIAGNOSIS — E876 Hypokalemia: Secondary | ICD-10-CM | POA: Diagnosis not present

## 2020-08-20 DIAGNOSIS — I5033 Acute on chronic diastolic (congestive) heart failure: Secondary | ICD-10-CM | POA: Diagnosis not present

## 2020-08-20 DIAGNOSIS — K2270 Barrett's esophagus without dysplasia: Secondary | ICD-10-CM | POA: Diagnosis not present

## 2020-08-20 DIAGNOSIS — I130 Hypertensive heart and chronic kidney disease with heart failure and stage 1 through stage 4 chronic kidney disease, or unspecified chronic kidney disease: Secondary | ICD-10-CM | POA: Diagnosis not present

## 2020-08-20 DIAGNOSIS — I272 Pulmonary hypertension, unspecified: Secondary | ICD-10-CM | POA: Diagnosis not present

## 2020-08-20 DIAGNOSIS — L89321 Pressure ulcer of left buttock, stage 1: Secondary | ICD-10-CM | POA: Diagnosis not present

## 2020-08-20 DIAGNOSIS — E785 Hyperlipidemia, unspecified: Secondary | ICD-10-CM | POA: Diagnosis not present

## 2020-08-20 DIAGNOSIS — D631 Anemia in chronic kidney disease: Secondary | ICD-10-CM | POA: Diagnosis not present

## 2020-08-20 DIAGNOSIS — H409 Unspecified glaucoma: Secondary | ICD-10-CM | POA: Diagnosis not present

## 2020-08-20 DIAGNOSIS — Z7901 Long term (current) use of anticoagulants: Secondary | ICD-10-CM | POA: Diagnosis not present

## 2020-08-20 DIAGNOSIS — M8008XD Age-related osteoporosis with current pathological fracture, vertebra(e), subsequent encounter for fracture with routine healing: Secondary | ICD-10-CM | POA: Diagnosis not present

## 2020-08-26 DIAGNOSIS — J849 Interstitial pulmonary disease, unspecified: Secondary | ICD-10-CM | POA: Diagnosis not present

## 2020-08-26 DIAGNOSIS — I130 Hypertensive heart and chronic kidney disease with heart failure and stage 1 through stage 4 chronic kidney disease, or unspecified chronic kidney disease: Secondary | ICD-10-CM | POA: Diagnosis not present

## 2020-08-26 DIAGNOSIS — E785 Hyperlipidemia, unspecified: Secondary | ICD-10-CM | POA: Diagnosis not present

## 2020-08-26 DIAGNOSIS — N1832 Chronic kidney disease, stage 3b: Secondary | ICD-10-CM | POA: Diagnosis not present

## 2020-09-05 DIAGNOSIS — I130 Hypertensive heart and chronic kidney disease with heart failure and stage 1 through stage 4 chronic kidney disease, or unspecified chronic kidney disease: Secondary | ICD-10-CM | POA: Diagnosis not present

## 2020-09-05 DIAGNOSIS — D631 Anemia in chronic kidney disease: Secondary | ICD-10-CM | POA: Diagnosis not present

## 2020-09-05 DIAGNOSIS — I5033 Acute on chronic diastolic (congestive) heart failure: Secondary | ICD-10-CM | POA: Diagnosis not present

## 2020-09-05 DIAGNOSIS — N1832 Chronic kidney disease, stage 3b: Secondary | ICD-10-CM | POA: Diagnosis not present

## 2020-09-06 DIAGNOSIS — E785 Hyperlipidemia, unspecified: Secondary | ICD-10-CM | POA: Diagnosis not present

## 2020-09-06 DIAGNOSIS — E876 Hypokalemia: Secondary | ICD-10-CM | POA: Diagnosis not present

## 2020-09-06 DIAGNOSIS — Z7901 Long term (current) use of anticoagulants: Secondary | ICD-10-CM | POA: Diagnosis not present

## 2020-09-06 DIAGNOSIS — R1310 Dysphagia, unspecified: Secondary | ICD-10-CM | POA: Diagnosis not present

## 2020-09-06 DIAGNOSIS — J849 Interstitial pulmonary disease, unspecified: Secondary | ICD-10-CM | POA: Diagnosis not present

## 2020-09-06 DIAGNOSIS — M8008XD Age-related osteoporosis with current pathological fracture, vertebra(e), subsequent encounter for fracture with routine healing: Secondary | ICD-10-CM | POA: Diagnosis not present

## 2020-09-06 DIAGNOSIS — I272 Pulmonary hypertension, unspecified: Secondary | ICD-10-CM | POA: Diagnosis not present

## 2020-09-06 DIAGNOSIS — I130 Hypertensive heart and chronic kidney disease with heart failure and stage 1 through stage 4 chronic kidney disease, or unspecified chronic kidney disease: Secondary | ICD-10-CM | POA: Diagnosis not present

## 2020-09-06 DIAGNOSIS — D631 Anemia in chronic kidney disease: Secondary | ICD-10-CM | POA: Diagnosis not present

## 2020-09-06 DIAGNOSIS — Z86718 Personal history of other venous thrombosis and embolism: Secondary | ICD-10-CM | POA: Diagnosis not present

## 2020-09-06 DIAGNOSIS — I5033 Acute on chronic diastolic (congestive) heart failure: Secondary | ICD-10-CM | POA: Diagnosis not present

## 2020-09-06 DIAGNOSIS — N1832 Chronic kidney disease, stage 3b: Secondary | ICD-10-CM | POA: Diagnosis not present

## 2020-09-06 DIAGNOSIS — I700 Atherosclerosis of aorta: Secondary | ICD-10-CM | POA: Diagnosis not present

## 2020-09-06 DIAGNOSIS — I4891 Unspecified atrial fibrillation: Secondary | ICD-10-CM | POA: Diagnosis not present

## 2020-09-06 DIAGNOSIS — H409 Unspecified glaucoma: Secondary | ICD-10-CM | POA: Diagnosis not present

## 2020-09-06 DIAGNOSIS — L89321 Pressure ulcer of left buttock, stage 1: Secondary | ICD-10-CM | POA: Diagnosis not present

## 2020-09-06 DIAGNOSIS — K2270 Barrett's esophagus without dysplasia: Secondary | ICD-10-CM | POA: Diagnosis not present

## 2020-09-15 ENCOUNTER — Ambulatory Visit (INDEPENDENT_AMBULATORY_CARE_PROVIDER_SITE_OTHER): Payer: Medicare Other | Admitting: Ophthalmology

## 2020-09-15 ENCOUNTER — Other Ambulatory Visit: Payer: Self-pay

## 2020-09-15 ENCOUNTER — Encounter (INDEPENDENT_AMBULATORY_CARE_PROVIDER_SITE_OTHER): Payer: Self-pay | Admitting: Ophthalmology

## 2020-09-15 DIAGNOSIS — H34832 Tributary (branch) retinal vein occlusion, left eye, with macular edema: Secondary | ICD-10-CM

## 2020-09-15 DIAGNOSIS — H348312 Tributary (branch) retinal vein occlusion, right eye, stable: Secondary | ICD-10-CM | POA: Diagnosis not present

## 2020-09-15 DIAGNOSIS — H353212 Exudative age-related macular degeneration, right eye, with inactive choroidal neovascularization: Secondary | ICD-10-CM | POA: Diagnosis not present

## 2020-09-15 NOTE — Assessment & Plan Note (Signed)
No active disease OD at this time

## 2020-09-15 NOTE — Progress Notes (Signed)
09/15/2020     CHIEF COMPLAINT Patient presents for Retina Follow Up   HISTORY OF PRESENT ILLNESS: Kelli Peterson is a 86 y.o. female who presents to the clinic today for:   HPI    Retina Follow Up    Patient presents with  CRVO/BRVO.  In both eyes.  Severity is moderate.  Duration of 3 months.  Since onset it is stable.  I, the attending physician,  performed the HPI with the patient and updated documentation appropriately.          Comments    3 month f\u OU. Possible Avastin OS. OCT  Pt states vision is doing great. Denies changes.       Last edited by Tilda Franco on 09/15/2020 10:21 AM. (History)      Referring physician: Jani Gravel, MD [address] [place],  [place] [address]  HISTORICAL INFORMATION:   Selected notes from the MEDICAL RECORD NUMBER       CURRENT MEDICATIONS: Current Outpatient Medications (Ophthalmic Drugs)  Medication Sig  . brimonidine (ALPHAGAN) 0.2 % ophthalmic solution Place 1 drop into the right eye 2 (two) times daily.  . dorzolamide (TRUSOPT) 2 % ophthalmic solution Place 1 drop into both eyes 2 (two) times daily.   Marland Kitchen latanoprost (XALATAN) 0.005 % ophthalmic solution Place 1 drop into both eyes daily at 12 noon.   Mckinley Jewel Dimesylate (RHOPRESSA) 0.02 % SOLN Place 1 drop into the right eye daily.    No current facility-administered medications for this visit. (Ophthalmic Drugs)   Current Outpatient Medications (Other)  Medication Sig  . acetaminophen (TYLENOL) 500 MG tablet Take 500 mg by mouth every 6 (six) hours as needed for pain.  Marland Kitchen apixaban (ELIQUIS) 2.5 MG TABS tablet Take 2.5 mg by mouth 2 (two) times daily.  Marland Kitchen atorvastatin (LIPITOR) 20 MG tablet Take 20 mg by mouth daily.  Marland Kitchen denosumab (PROLIA) 60 MG/ML SOSY injection Inject 60 mg into the skin every 6 (six) months.  . diltiazem (CARDIZEM CD) 240 MG 24 hr capsule Take 1 capsule (240 mg total) by mouth daily.  Marland Kitchen guaiFENesin (MUCINEX) 600 MG 12 hr tablet  Take 600 mg by mouth 2 (two) times daily as needed for to loosen phlegm.  . metoprolol succinate (TOPROL-XL) 25 MG 24 hr tablet Take 3 tablets (75 mg total) by mouth daily.  . Multiple Vitamin (MULTIVITAMIN WITH MINERALS) TABS tablet Take 1 tablet by mouth daily.  . potassium chloride (KLOR-CON) 10 MEQ tablet Take 1 tablet (10 mEq total) by mouth daily.  Marland Kitchen torsemide (DEMADEX) 20 MG tablet Take 1 tablet (20 mg total) by mouth 2 (two) times daily.   No current facility-administered medications for this visit. (Other)      REVIEW OF SYSTEMS:    ALLERGIES Allergies  Allergen Reactions  . Penicillins Swelling and Other (See Comments)    Ankles and feet swell  . Zocor [Simvastatin] Other (See Comments)    Weak and dizzy  . Nsaids Other (See Comments)    Tylenol is all that is permitted    PAST MEDICAL HISTORY Past Medical History:  Diagnosis Date  . Adenomatous colon polyp 2007  . Atrial fibrillation ([place])   . Barrett's esophagus 2007  . Cerebellar hemorrhage ([place])   . Cervical cancer ([place])   . CHF (congestive heart failure) ([place])   . Closed pelvic fracture ([place])   . CVD (cardiovascular disease)   . DVT (deep venous thrombosis) ([place])   . Fibrocystic  breast disease   . Fracture of pubic ramus ([place])   . Hyperlipidemia   . Hypertension   . Iron deficiency anemia    Past Surgical History:  Procedure Laterality Date  . CATARACT EXTRACTION Right 02/15/2010   [facility]  . CATARACT EXTRACTION Left 01/30/2010   [facility]  . CATARACT EXTRACTION, BILATERAL  2010  . TOTAL ABDOMINAL HYSTERECTOMY  1964    FAMILY HISTORY Family History  Problem Relation Age of Onset  . CVA Father   . CVA Mother     SOCIAL HISTORY Social History   Tobacco Use  . Smoking status: Never Smoker  . Smokeless tobacco: Never Used  Substance Use Topics  . Alcohol use: No  . Drug use: No         OPHTHALMIC EXAM:  Base Eye Exam    Visual Acuity (Snellen - Linear)      Right Left   Dist [place]  20/100 20/200   Dist ph [place] 20/60 -2 20/200 +       Tonometry (Tonopen, 10:27 AM)      Right Left   Pressure 14 15       Pupils      Pupils Dark Light Shape React APD   Right PERRL 4 3 Round Brisk None   Left PERRL 4 3 Round Brisk None       Visual Fields (Counting fingers)      Left Right    Full Full       Neuro/Psych    Oriented x3: Yes   Mood/Affect: Normal       Dilation    Both eyes: 1.0% Mydriacyl, 2.5% Phenylephrine @ 10:27 AM        Slit Lamp and Fundus Exam    External Exam      Right Left   External Normal Normal       Slit Lamp Exam      Right Left   Lids/Lashes Normal Normal   Conjunctiva/Sclera White and quiet White and quiet   Cornea Clear Clear   Anterior Chamber Deep and quiet Deep and quiet   Iris Round and reactive Round and reactive   Lens Posterior chamber intraocular lens Posterior chamber intraocular lens   Anterior Vitreous Normal Normal       Fundus Exam      Right Left   Posterior Vitreous Normal Posterior vitreous detachment   Disc Normal 1+ Pallor,, with collaterals on the superior pole of the nerve   C/D Ratio 0.55 0.75   Macula Normal No hemorrhages, sparse microaneurysms and no thickening centrally   Vessels No active disease OD Superior hemicentral retinal vein occlusion left eye, only shunt vessels remain temporal to macula   Periphery Old inferotemporal sector local PRP Normal          IMAGING AND PROCEDURES  Imaging and Procedures for 09/15/20  OCT, Retina - OU - Both Eyes       Right Eye Quality was borderline. Findings include normal foveal contour.   Left Eye Quality was borderline. Progression has been stable. Findings include abnormal foveal contour.   Notes Patient is currently in a wheelchair so positioning difficulties limit the performance of OCT nonetheless the macula right eye is entirely normal  The left eye has mild residual central thickening, no overt CME, will observe                 ASSESSMENT/PLAN:  Branch retinal vein occlusion of right eye Inactive from the past current  no CME, observe  Branch retinal vein occlusion with macular edema of left eye Old, stable currently at 38-month follow-up with minor residual CME chronic stable will observe  Exudative age-related macular degeneration of right eye with inactive choroidal neovascularization (HCC) No active disease OD at this time      ICD-10-CM   1. Branch retinal vein occlusion with macular edema of left eye  H34.8320 OCT, Retina - OU - Both Eyes  2. Stable branch retinal vein occlusion of right eye  H34.8312   3. Exudative age-related macular degeneration of right eye with inactive choroidal neovascularization ([place])  H35.3212     1.  Retinal vein occlusion left eye with previous macular edema controlled and now stable with an active disease at 60-month interval, will observe OS  2.  History of old BRVO OD, stable  3.  History of active CNVM OD now stable  Follow-up Dr. Kathlen Mody as scheduled  Ophthalmic Meds Ordered this visit:  No orders of the defined types were placed in this encounter.      Return in about 4 months (around 01/14/2021) for DILATE OU, OCT.  There are no Patient Instructions on file for this visit.   Explained the diagnoses, plan, and follow up with the patient and they expressed understanding.  Patient expressed understanding of the importance of proper follow up care.   Clent Demark Ilan Kahrs M.D. Diseases & Surgery of the Retina and Vitreous Retina & Diabetic [place] 09/15/20     Abbreviations: M myopia (nearsighted); A astigmatism; H hyperopia (farsighted); P presbyopia; Mrx spectacle prescription;  CTL contact lenses; OD right eye; OS left eye; OU both eyes  XT exotropia; ET esotropia; PEK punctate epithelial keratitis; PEE punctate epithelial erosions; DES dry eye syndrome; MGD meibomian gland dysfunction; ATs artificial tears; PFAT's preservative free artificial tears; [place]  nuclear sclerotic cataract; PSC posterior subcapsular cataract; ERM epi-retinal membrane; PVD posterior vitreous detachment; RD retinal detachment; DM diabetes mellitus; DR diabetic retinopathy; NPDR non-proliferative diabetic retinopathy; PDR proliferative diabetic retinopathy; CSME clinically significant macular edema; DME diabetic macular edema; dbh dot blot hemorrhages; CWS cotton wool spot; POAG primary open angle glaucoma; C/D cup-to-disc ratio; HVF humphrey visual field; GVF goldmann visual field; OCT optical coherence tomography; IOP intraocular pressure; BRVO Branch retinal vein occlusion; CRVO central retinal vein occlusion; CRAO central retinal artery occlusion; BRAO branch retinal artery occlusion; RT retinal tear; SB scleral buckle; PPV pars plana vitrectomy; VH Vitreous hemorrhage; PRP panretinal laser photocoagulation; IVK intravitreal kenalog; VMT vitreomacular traction; MH Macular hole;  NVD neovascularization of the disc; NVE neovascularization elsewhere; AREDS age related eye disease study; ARMD age related macular degeneration; POAG primary open angle glaucoma; EBMD epithelial/anterior basement membrane dystrophy; ACIOL anterior chamber intraocular lens; IOL intraocular lens; PCIOL posterior chamber intraocular lens; Phaco/IOL phacoemulsification with intraocular lens placement; [place] photorefractive keratectomy; LASIK laser assisted in situ keratomileusis; HTN hypertension; DM diabetes mellitus; COPD chronic obstructive pulmonary disease

## 2020-09-15 NOTE — Assessment & Plan Note (Signed)
Old, stable currently at 33-month follow-up with minor residual CME chronic stable will observe

## 2020-09-15 NOTE — Assessment & Plan Note (Signed)
Inactive from the past current no CME, observe

## 2020-12-08 DIAGNOSIS — M810 Age-related osteoporosis without current pathological fracture: Secondary | ICD-10-CM | POA: Diagnosis not present

## 2021-01-12 DIAGNOSIS — I509 Heart failure, unspecified: Secondary | ICD-10-CM | POA: Diagnosis not present

## 2021-01-12 DIAGNOSIS — R5383 Other fatigue: Secondary | ICD-10-CM | POA: Diagnosis not present

## 2021-01-12 DIAGNOSIS — R739 Hyperglycemia, unspecified: Secondary | ICD-10-CM | POA: Diagnosis not present

## 2021-01-12 DIAGNOSIS — I4891 Unspecified atrial fibrillation: Secondary | ICD-10-CM | POA: Diagnosis not present

## 2021-01-12 DIAGNOSIS — M810 Age-related osteoporosis without current pathological fracture: Secondary | ICD-10-CM | POA: Diagnosis not present

## 2021-01-19 ENCOUNTER — Encounter (INDEPENDENT_AMBULATORY_CARE_PROVIDER_SITE_OTHER): Payer: Medicare Other | Admitting: Ophthalmology

## 2021-01-20 DIAGNOSIS — Z0000 Encounter for general adult medical examination without abnormal findings: Secondary | ICD-10-CM | POA: Diagnosis not present

## 2021-01-20 DIAGNOSIS — Z7901 Long term (current) use of anticoagulants: Secondary | ICD-10-CM | POA: Diagnosis not present

## 2021-01-20 DIAGNOSIS — I4891 Unspecified atrial fibrillation: Secondary | ICD-10-CM | POA: Diagnosis not present

## 2021-01-20 DIAGNOSIS — I10 Essential (primary) hypertension: Secondary | ICD-10-CM | POA: Diagnosis not present

## 2021-01-20 DIAGNOSIS — I6930 Unspecified sequelae of cerebral infarction: Secondary | ICD-10-CM | POA: Diagnosis not present

## 2021-01-20 DIAGNOSIS — E7800 Pure hypercholesterolemia, unspecified: Secondary | ICD-10-CM | POA: Diagnosis not present

## 2021-01-20 DIAGNOSIS — R739 Hyperglycemia, unspecified: Secondary | ICD-10-CM | POA: Diagnosis not present

## 2021-01-20 IMAGING — DX DG CHEST 1V PORT
1 series · 1 of 1 positions shown · non-contrast
Comparison: Two days ago

CLINICAL DATA: Acute hypoxic respiratory failure

EXAM:
PORTABLE CHEST 1 VIEW

[chest ap]
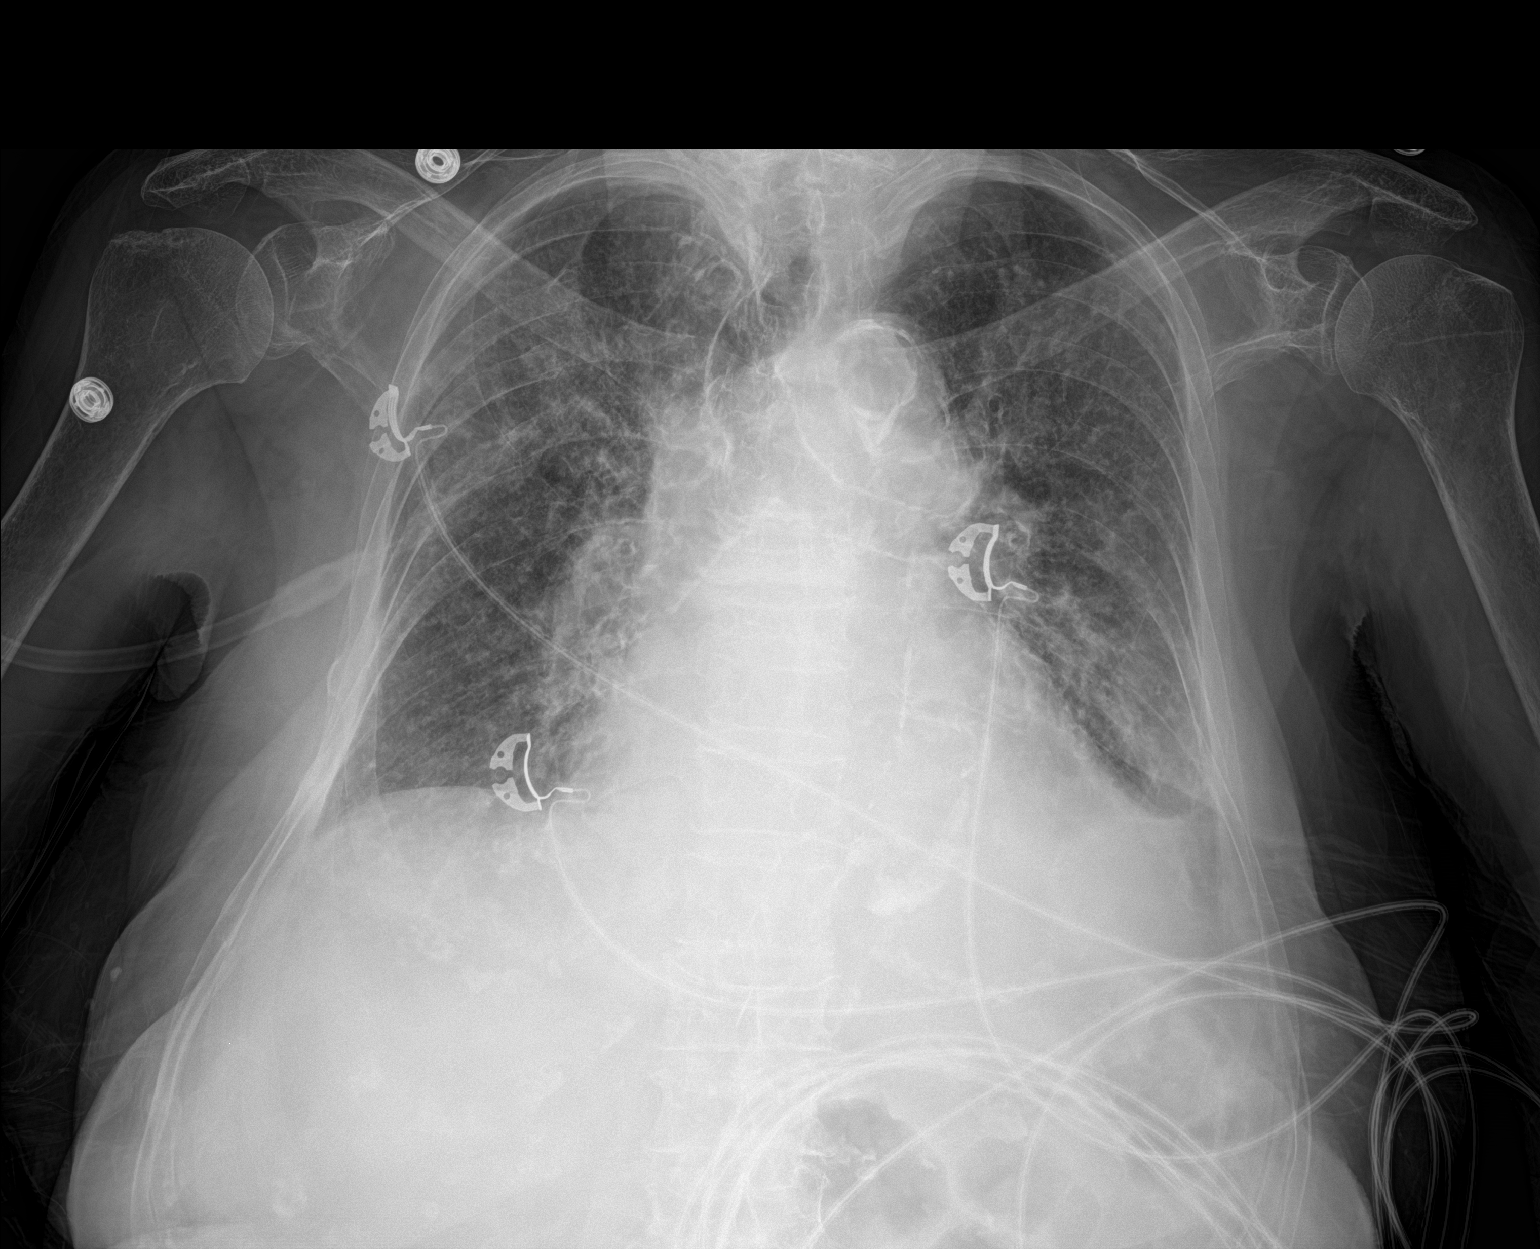

[1 of 1 positions shown; findings below may reference images not displayed]

FINDINGS: Interstitial coarsening which is essentially stable from most recent
prior and accentuated compared to more remote priors. Cardiomegaly
and vascular pedicle widening. Extensive atherosclerotic
calcification. Probable small left pleural effusion. No
pneumothorax. Midthoracic compression fractures.
IMPRESSION: Stable cardiomegaly and interstitial opacity that could be
inflammatory or congestive.

## 2021-01-23 IMAGING — DX DG CHEST 1V PORT
1 series · 1 of 1 positions shown · non-contrast
Comparison: Chest radiograph 04/19/2020

CLINICAL DATA: Evaluate Stable cardiomegaly. Stable interstitial
densities are noted throughout both lungs which may represent edema
or possibly atypical inflammation

EXAM:
PORTABLE CHEST 1 VIEW

[chest]
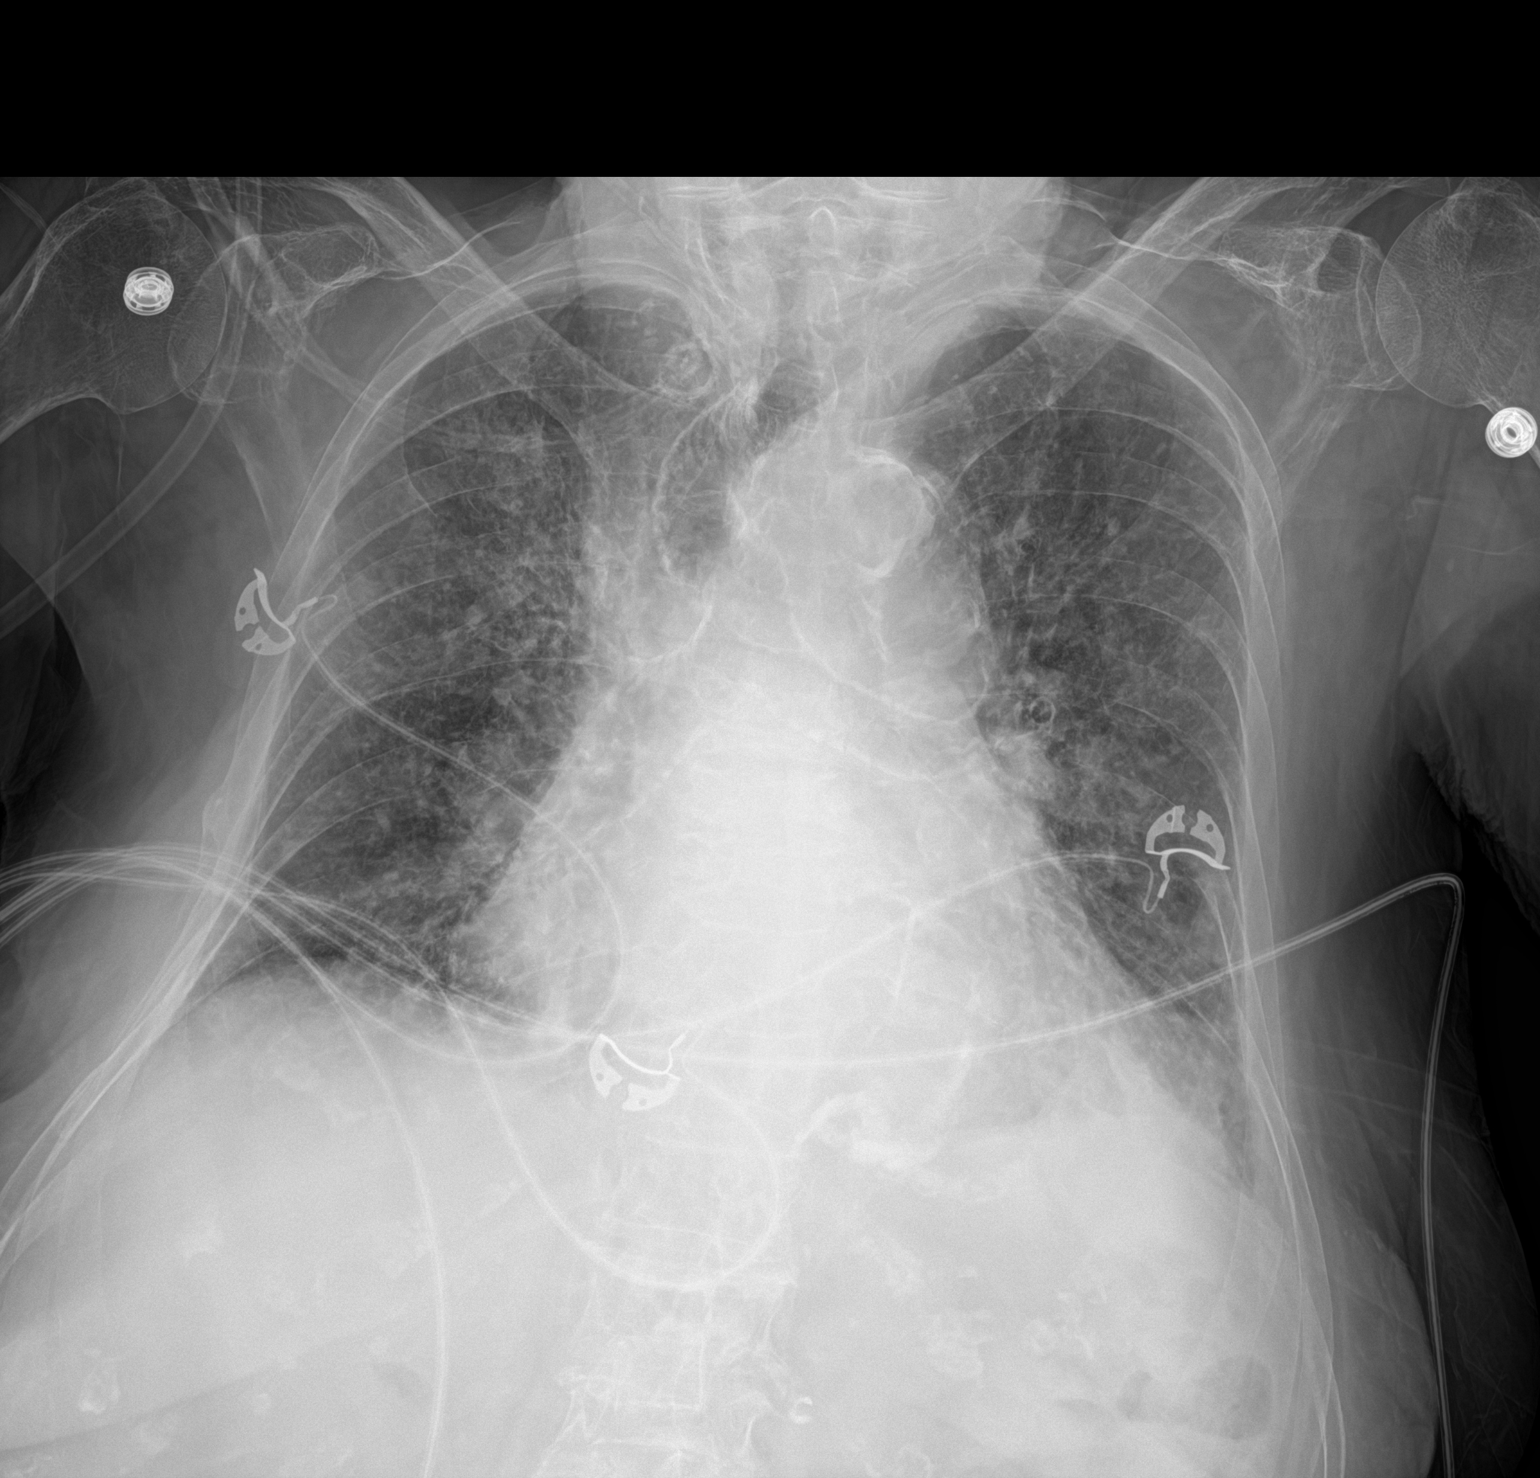

[1 of 1 positions shown; findings below may reference images not displayed]

FINDINGS: Stable cardiomediastinal contours. Aortic atherosclerotic
calcification. Persistent diffuse bilateral interstitial opacities.
Small bandlike opacity at the left base likely atelectasis. No
pneumothorax or large pleural effusion. No acute finding in the
visualized skeleton. Old right rib fracture.
IMPRESSION: Unchanged diffuse bilateral interstitial opacities. Probable mild
left basilar atelectasis.

## 2021-01-24 IMAGING — DX DG CHEST 1V PORT
1 series · 1 of 1 positions shown · non-contrast
Comparison: 04/20/2020

CLINICAL DATA: Shortness of breath

EXAM:
PORTABLE CHEST 1 VIEW

[chest ap]
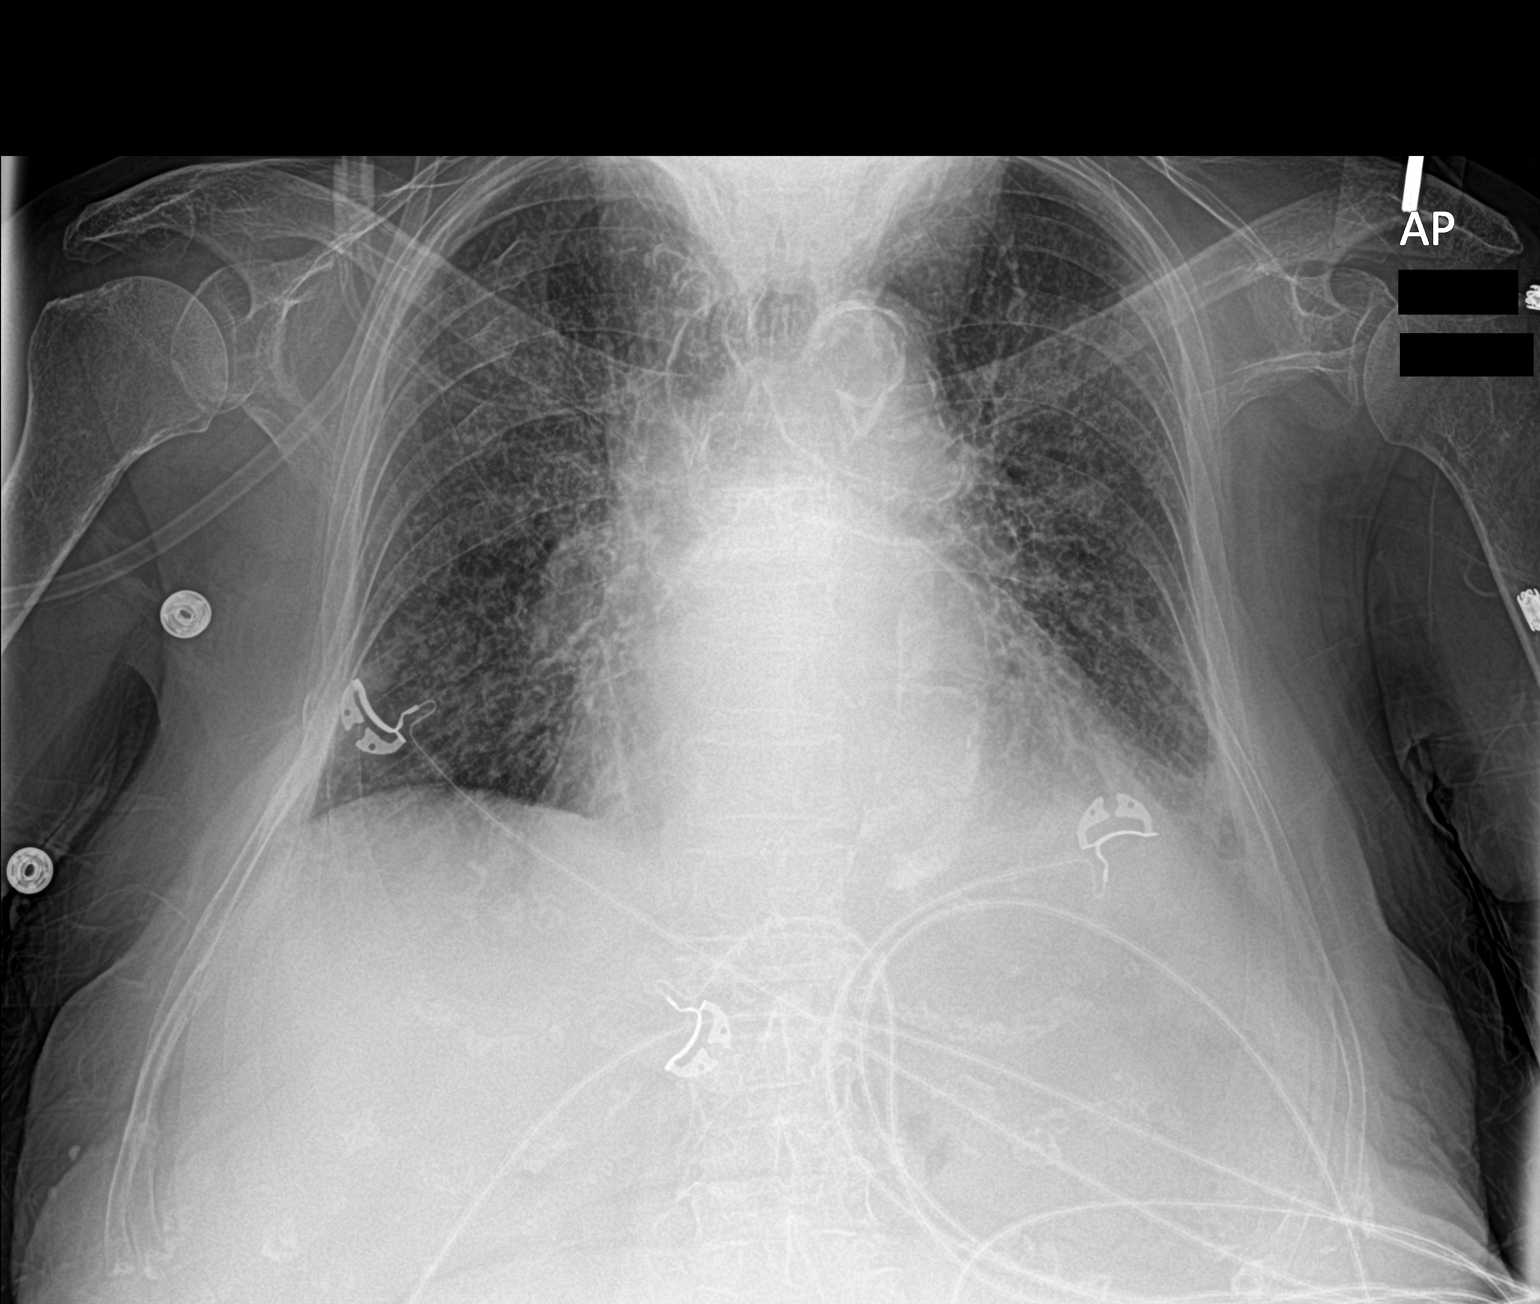

[1 of 1 positions shown; findings below may reference images not displayed]

FINDINGS: Increased interstitial prominence and patchy density at the left
lung base is without substantial change. No significant pleural
effusion. No pneumothorax. Stable cardiomediastinal contours.
IMPRESSION: No substantial change since 04/20/2020 with persistent increased
interstitial prominence and patchy density at the left lung base.

## 2021-01-25 IMAGING — DX DG CHEST 1V PORT
1 series · 1 of 1 positions shown · non-contrast
Comparison: Portable exam 4080 hours compared to 04/21/2020

CLINICAL DATA: Shortness of breath

EXAM:
PORTABLE CHEST 1 VIEW

[chest ap]
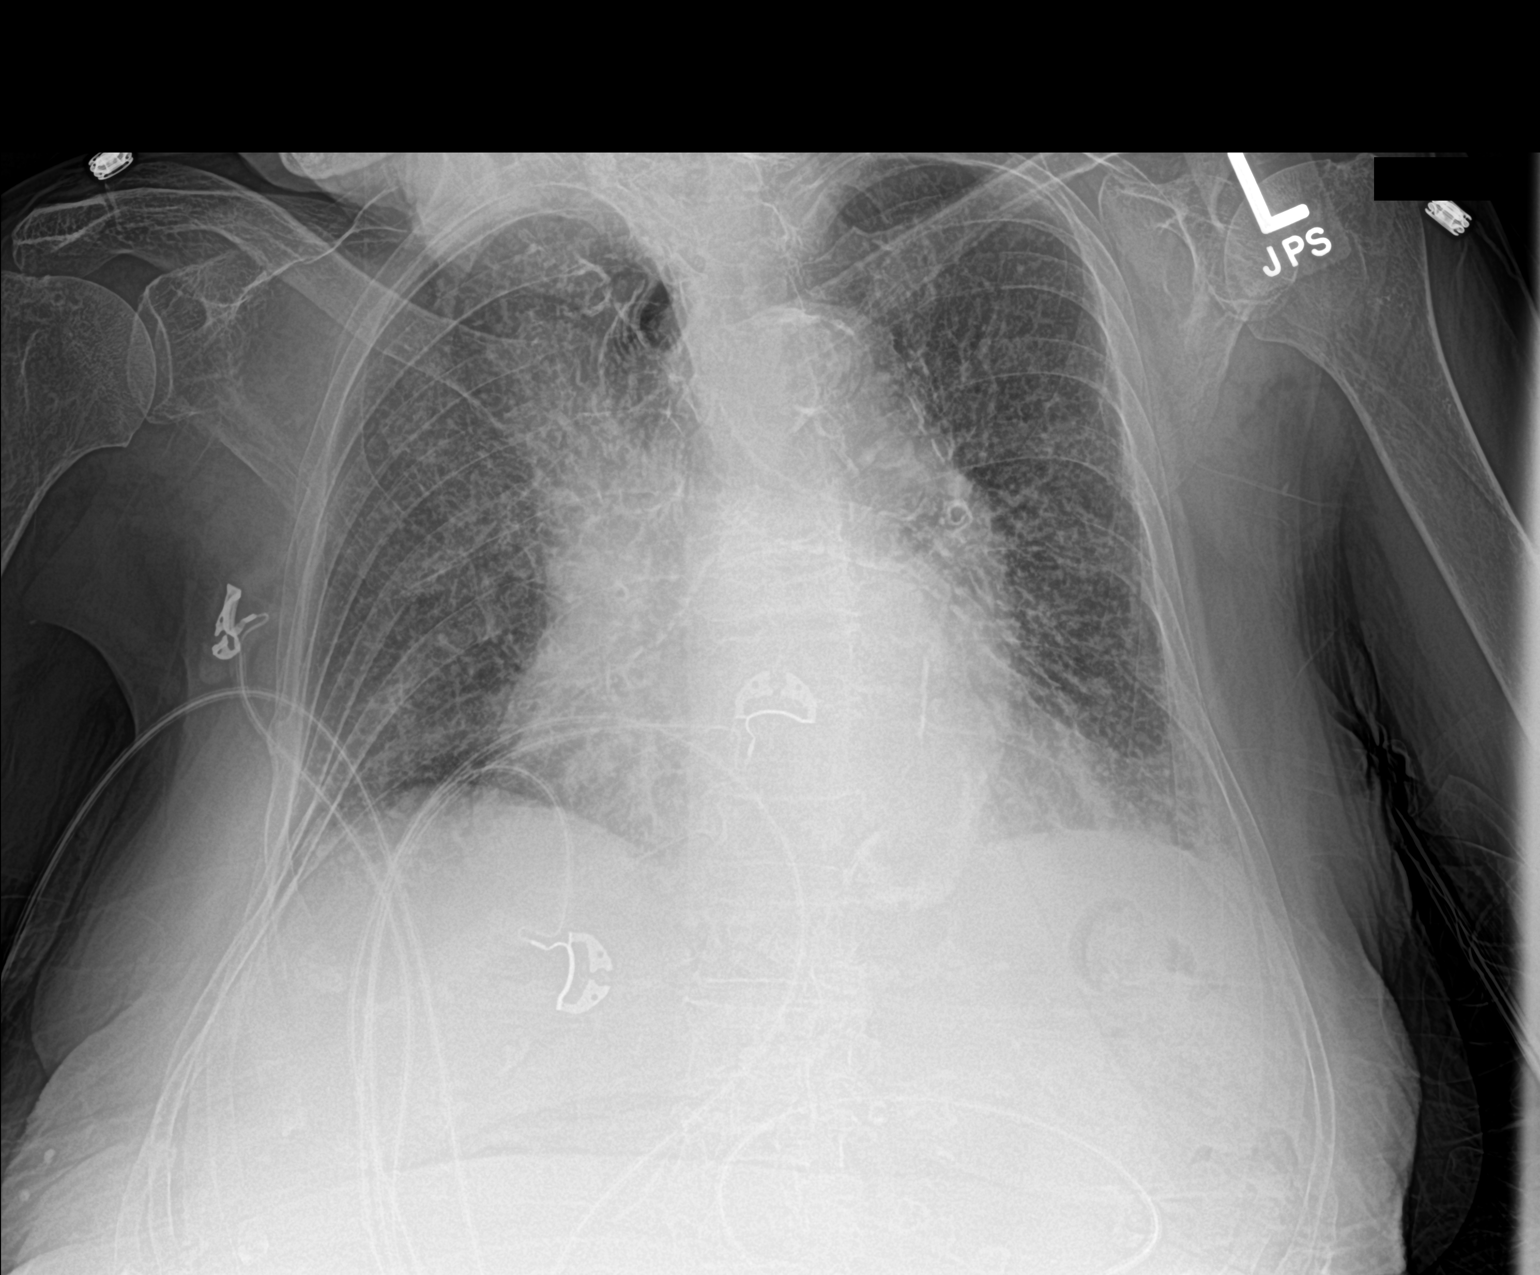

[1 of 1 positions shown; findings below may reference images not displayed]

FINDINGS: Rotated to the RIGHT.

Normal heart size, mediastinal contours, and pulmonary vascularity.

Atherosclerotic calcification aorta.

Chronic increased interstitial markings diffusely throughout both
lungs.

Persistent atelectasis versus consolidation LEFT lung base.

No pleural effusion or pneumothorax.

Bones demineralized.
IMPRESSION: Chronic interstitial prominence with unchanged atelectasis versus
consolidation at LEFT lung base.

## 2021-01-26 IMAGING — DX DG CHEST 1V PORT
1 series · 1 of 1 positions shown · non-contrast
Comparison: April 22, 2020.

CLINICAL DATA: Shortness of breath.

EXAM:
PORTABLE CHEST 1 VIEW

[chest ap]
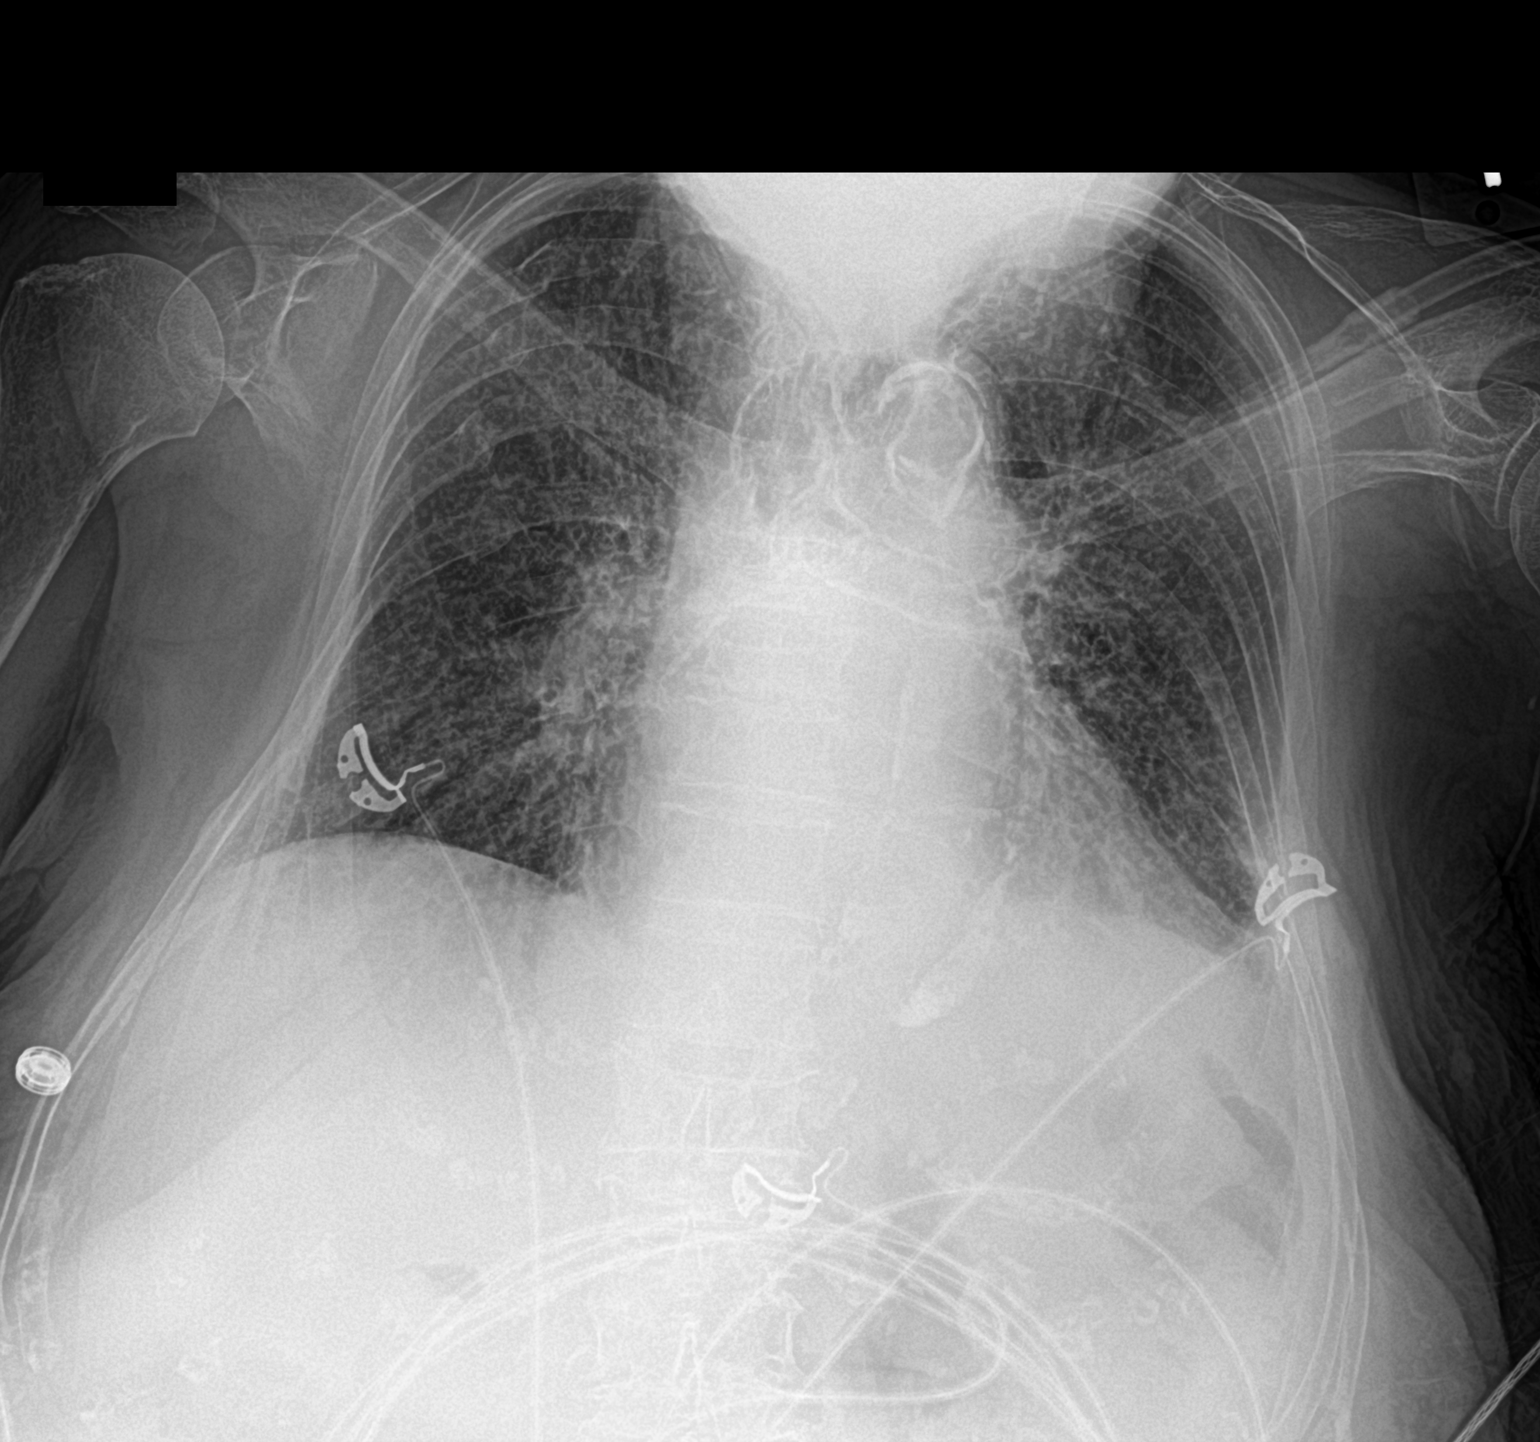

[1 of 1 positions shown; findings below may reference images not displayed]

FINDINGS: Stable cardiomediastinal silhouette. No pneumothorax or pleural
effusion is noted. Stable diffuse interstitial densities are noted
throughout both lungs which may represent scarring, although
superimposed inflammation or edema cannot be excluded. Old right rib
fractures are noted.
IMPRESSION: Stable diffuse interstitial densities are noted throughout both
lungs which may represent scarring, although superimposed
inflammation or edema cannot be excluded.

Aortic Atherosclerosis ([id number]).

## 2021-02-22 ENCOUNTER — Emergency Department (HOSPITAL_COMMUNITY): Payer: Medicare Other

## 2021-02-22 ENCOUNTER — Inpatient Hospital Stay (HOSPITAL_COMMUNITY)
Admission: EM | Admit: 2021-02-22 | Discharge: 2021-03-03 | DRG: 535 | Disposition: A | Discharge status: Skilled Nursing Facility | Payer: Medicare Other | Attending: Internal Medicine | Admitting: Internal Medicine

## 2021-02-22 ENCOUNTER — Encounter (HOSPITAL_COMMUNITY): Payer: Self-pay | Admitting: Emergency Medicine

## 2021-02-22 DIAGNOSIS — Z79899 Other long term (current) drug therapy: Secondary | ICD-10-CM

## 2021-02-22 DIAGNOSIS — D631 Anemia in chronic kidney disease: Secondary | ICD-10-CM | POA: Diagnosis present

## 2021-02-22 DIAGNOSIS — Z743 Need for continuous supervision: Secondary | ICD-10-CM | POA: Diagnosis not present

## 2021-02-22 DIAGNOSIS — S72009A Fracture of unspecified part of neck of unspecified femur, initial encounter for closed fracture: Secondary | ICD-10-CM

## 2021-02-22 DIAGNOSIS — K6289 Other specified diseases of anus and rectum: Secondary | ICD-10-CM | POA: Diagnosis not present

## 2021-02-22 DIAGNOSIS — I340 Nonrheumatic mitral (valve) insufficiency: Secondary | ICD-10-CM | POA: Diagnosis not present

## 2021-02-22 DIAGNOSIS — N1831 Chronic kidney disease, stage 3a: Secondary | ICD-10-CM | POA: Diagnosis not present

## 2021-02-22 DIAGNOSIS — I130 Hypertensive heart and chronic kidney disease with heart failure and stage 1 through stage 4 chronic kidney disease, or unspecified chronic kidney disease: Secondary | ICD-10-CM | POA: Diagnosis present

## 2021-02-22 DIAGNOSIS — J449 Chronic obstructive pulmonary disease, unspecified: Secondary | ICD-10-CM | POA: Diagnosis present

## 2021-02-22 DIAGNOSIS — K661 Hemoperitoneum: Secondary | ICD-10-CM | POA: Diagnosis not present

## 2021-02-22 DIAGNOSIS — S0990XA Unspecified injury of head, initial encounter: Secondary | ICD-10-CM | POA: Diagnosis not present

## 2021-02-22 DIAGNOSIS — Z6820 Body mass index (BMI) 20.0-20.9, adult: Secondary | ICD-10-CM

## 2021-02-22 DIAGNOSIS — R2681 Unsteadiness on feet: Secondary | ICD-10-CM | POA: Diagnosis not present

## 2021-02-22 DIAGNOSIS — I5189 Other ill-defined heart diseases: Secondary | ICD-10-CM | POA: Diagnosis not present

## 2021-02-22 DIAGNOSIS — N179 Acute kidney failure, unspecified: Secondary | ICD-10-CM | POA: Diagnosis not present

## 2021-02-22 DIAGNOSIS — Z86718 Personal history of other venous thrombosis and embolism: Secondary | ICD-10-CM

## 2021-02-22 DIAGNOSIS — B9620 Unspecified Escherichia coli [E. coli] as the cause of diseases classified elsewhere: Secondary | ICD-10-CM | POA: Diagnosis not present

## 2021-02-22 DIAGNOSIS — J690 Pneumonitis due to inhalation of food and vomit: Secondary | ICD-10-CM | POA: Diagnosis not present

## 2021-02-22 DIAGNOSIS — Y92012 Bathroom of single-family (private) house as the place of occurrence of the external cause: Secondary | ICD-10-CM

## 2021-02-22 DIAGNOSIS — N9489 Other specified conditions associated with female genital organs and menstrual cycle: Secondary | ICD-10-CM

## 2021-02-22 DIAGNOSIS — E785 Hyperlipidemia, unspecified: Secondary | ICD-10-CM | POA: Diagnosis present

## 2021-02-22 DIAGNOSIS — I4891 Unspecified atrial fibrillation: Secondary | ICD-10-CM | POA: Diagnosis present

## 2021-02-22 DIAGNOSIS — W19XXXA Unspecified fall, initial encounter: Secondary | ICD-10-CM | POA: Diagnosis not present

## 2021-02-22 DIAGNOSIS — G9389 Other specified disorders of brain: Secondary | ICD-10-CM | POA: Diagnosis not present

## 2021-02-22 DIAGNOSIS — J069 Acute upper respiratory infection, unspecified: Secondary | ICD-10-CM | POA: Diagnosis not present

## 2021-02-22 DIAGNOSIS — I361 Nonrheumatic tricuspid (valve) insufficiency: Secondary | ICD-10-CM | POA: Diagnosis not present

## 2021-02-22 DIAGNOSIS — R739 Hyperglycemia, unspecified: Secondary | ICD-10-CM | POA: Diagnosis present

## 2021-02-22 DIAGNOSIS — I071 Rheumatic tricuspid insufficiency: Secondary | ICD-10-CM | POA: Diagnosis present

## 2021-02-22 DIAGNOSIS — S32501A Unspecified fracture of right pubis, initial encounter for closed fracture: Secondary | ICD-10-CM | POA: Diagnosis not present

## 2021-02-22 DIAGNOSIS — I4821 Permanent atrial fibrillation: Secondary | ICD-10-CM | POA: Diagnosis present

## 2021-02-22 DIAGNOSIS — Z7901 Long term (current) use of anticoagulants: Secondary | ICD-10-CM

## 2021-02-22 DIAGNOSIS — S3282XA Multiple fractures of pelvis without disruption of pelvic ring, initial encounter for closed fracture: Secondary | ICD-10-CM | POA: Diagnosis not present

## 2021-02-22 DIAGNOSIS — I517 Cardiomegaly: Secondary | ICD-10-CM | POA: Diagnosis not present

## 2021-02-22 DIAGNOSIS — R54 Age-related physical debility: Secondary | ICD-10-CM | POA: Diagnosis present

## 2021-02-22 DIAGNOSIS — Z20822 Contact with and (suspected) exposure to covid-19: Secondary | ICD-10-CM | POA: Diagnosis not present

## 2021-02-22 DIAGNOSIS — E43 Unspecified severe protein-calorie malnutrition: Secondary | ICD-10-CM | POA: Diagnosis not present

## 2021-02-22 DIAGNOSIS — M25569 Pain in unspecified knee: Secondary | ICD-10-CM

## 2021-02-22 DIAGNOSIS — N390 Urinary tract infection, site not specified: Secondary | ICD-10-CM | POA: Diagnosis not present

## 2021-02-22 DIAGNOSIS — R0602 Shortness of breath: Secondary | ICD-10-CM

## 2021-02-22 DIAGNOSIS — Z66 Do not resuscitate: Secondary | ICD-10-CM | POA: Diagnosis not present

## 2021-02-22 DIAGNOSIS — G9341 Metabolic encephalopathy: Secondary | ICD-10-CM | POA: Diagnosis not present

## 2021-02-22 DIAGNOSIS — I509 Heart failure, unspecified: Secondary | ICD-10-CM | POA: Diagnosis not present

## 2021-02-22 DIAGNOSIS — J9601 Acute respiratory failure with hypoxia: Secondary | ICD-10-CM | POA: Diagnosis not present

## 2021-02-22 DIAGNOSIS — R262 Difficulty in walking, not elsewhere classified: Secondary | ICD-10-CM | POA: Diagnosis not present

## 2021-02-22 DIAGNOSIS — I6782 Cerebral ischemia: Secondary | ICD-10-CM | POA: Diagnosis not present

## 2021-02-22 DIAGNOSIS — I272 Pulmonary hypertension, unspecified: Secondary | ICD-10-CM | POA: Diagnosis not present

## 2021-02-22 DIAGNOSIS — M6281 Muscle weakness (generalized): Secondary | ICD-10-CM | POA: Diagnosis not present

## 2021-02-22 DIAGNOSIS — Z886 Allergy status to analgesic agent status: Secondary | ICD-10-CM

## 2021-02-22 DIAGNOSIS — I5032 Chronic diastolic (congestive) heart failure: Secondary | ICD-10-CM | POA: Diagnosis not present

## 2021-02-22 DIAGNOSIS — S3289XA Fracture of other parts of pelvis, initial encounter for closed fracture: Secondary | ICD-10-CM | POA: Diagnosis not present

## 2021-02-22 DIAGNOSIS — M25551 Pain in right hip: Secondary | ICD-10-CM | POA: Diagnosis not present

## 2021-02-22 DIAGNOSIS — I482 Chronic atrial fibrillation, unspecified: Secondary | ICD-10-CM | POA: Diagnosis present

## 2021-02-22 DIAGNOSIS — S329XXA Fracture of unspecified parts of lumbosacral spine and pelvis, initial encounter for closed fracture: Secondary | ICD-10-CM | POA: Diagnosis not present

## 2021-02-22 DIAGNOSIS — Z8541 Personal history of malignant neoplasm of cervix uteri: Secondary | ICD-10-CM | POA: Diagnosis not present

## 2021-02-22 DIAGNOSIS — R0902 Hypoxemia: Secondary | ICD-10-CM | POA: Diagnosis not present

## 2021-02-22 DIAGNOSIS — K6389 Other specified diseases of intestine: Secondary | ICD-10-CM | POA: Diagnosis not present

## 2021-02-22 DIAGNOSIS — N183 Chronic kidney disease, stage 3 unspecified: Secondary | ICD-10-CM | POA: Diagnosis present

## 2021-02-22 DIAGNOSIS — S300XXA Contusion of lower back and pelvis, initial encounter: Secondary | ICD-10-CM | POA: Diagnosis not present

## 2021-02-22 DIAGNOSIS — S32591A Other specified fracture of right pubis, initial encounter for closed fracture: Principal | ICD-10-CM | POA: Diagnosis present

## 2021-02-22 DIAGNOSIS — Z515 Encounter for palliative care: Secondary | ICD-10-CM | POA: Diagnosis not present

## 2021-02-22 DIAGNOSIS — M1711 Unilateral primary osteoarthritis, right knee: Secondary | ICD-10-CM | POA: Diagnosis not present

## 2021-02-22 DIAGNOSIS — I10 Essential (primary) hypertension: Secondary | ICD-10-CM | POA: Diagnosis not present

## 2021-02-22 DIAGNOSIS — W010XXA Fall on same level from slipping, tripping and stumbling without subsequent striking against object, initial encounter: Secondary | ICD-10-CM | POA: Diagnosis present

## 2021-02-22 DIAGNOSIS — S329XXD Fracture of unspecified parts of lumbosacral spine and pelvis, subsequent encounter for fracture with routine healing: Secondary | ICD-10-CM | POA: Diagnosis not present

## 2021-02-22 DIAGNOSIS — I2510 Atherosclerotic heart disease of native coronary artery without angina pectoris: Secondary | ICD-10-CM | POA: Diagnosis present

## 2021-02-22 DIAGNOSIS — R1312 Dysphagia, oropharyngeal phase: Secondary | ICD-10-CM | POA: Diagnosis not present

## 2021-02-22 DIAGNOSIS — S32501D Unspecified fracture of right pubis, subsequent encounter for fracture with routine healing: Secondary | ICD-10-CM | POA: Diagnosis not present

## 2021-02-22 DIAGNOSIS — D68318 Other hemorrhagic disorder due to intrinsic circulating anticoagulants, antibodies, or inhibitors: Secondary | ICD-10-CM | POA: Diagnosis not present

## 2021-02-22 DIAGNOSIS — Z888 Allergy status to other drugs, medicaments and biological substances status: Secondary | ICD-10-CM

## 2021-02-22 DIAGNOSIS — Z823 Family history of stroke: Secondary | ICD-10-CM | POA: Diagnosis not present

## 2021-02-22 DIAGNOSIS — R9431 Abnormal electrocardiogram [ECG] [EKG]: Secondary | ICD-10-CM | POA: Diagnosis not present

## 2021-02-22 DIAGNOSIS — N189 Chronic kidney disease, unspecified: Secondary | ICD-10-CM | POA: Diagnosis not present

## 2021-02-22 DIAGNOSIS — Z880 Allergy status to penicillin: Secondary | ICD-10-CM

## 2021-02-22 DIAGNOSIS — I499 Cardiac arrhythmia, unspecified: Secondary | ICD-10-CM | POA: Diagnosis not present

## 2021-02-22 DIAGNOSIS — J849 Interstitial pulmonary disease, unspecified: Secondary | ICD-10-CM | POA: Diagnosis not present

## 2021-02-22 HISTORY — DX: Pulmonary hypertension, unspecified: I27.20

## 2021-02-22 HISTORY — DX: Permanent atrial fibrillation: I48.21

## 2021-02-22 HISTORY — DX: Rheumatic tricuspid insufficiency: I07.1

## 2021-02-22 HISTORY — DX: Chronic diastolic (congestive) heart failure: I50.32

## 2021-02-22 HISTORY — DX: Unspecified macular degeneration: H35.30

## 2021-02-22 HISTORY — DX: Chronic kidney disease, stage 2 (mild): N18.2

## 2021-02-22 LAB — BASIC METABOLIC PANEL
Anion gap: 12 (ref 5–15)
BUN: 32 mg/dL — ABNORMAL HIGH (ref 8–23)
CO2: 27 mmol/L (ref 22–32)
Calcium: 9.2 mg/dL (ref 8.9–10.3)
Chloride: 105 mmol/L (ref 98–111)
Creatinine, Ser: 1.57 mg/dL — ABNORMAL HIGH (ref 0.44–1.00)
GFR, Estimated: 32 mL/min — ABNORMAL LOW (ref >60)
Glucose, Bld: 151 mg/dL — ABNORMAL HIGH (ref 70–99)
Potassium: 4 mmol/L (ref 3.5–5.1)
Sodium: 144 mmol/L (ref 135–145)

## 2021-02-22 LAB — CBC WITH DIFFERENTIAL/PLATELET
Abs Immature Granulocytes: 0.13 10*3/uL — ABNORMAL HIGH (ref 0.00–0.07)
Basophils Absolute: 0.1 10*3/uL (ref 0.0–0.1)
Basophils Relative: 0 %
Eosinophils Absolute: 0 10*3/uL (ref 0.0–0.5)
Eosinophils Relative: 0 %
HCT: 44.5 % (ref 36.0–46.0)
Hemoglobin: 14.1 g/dL (ref 12.0–15.0)
Immature Granulocytes: 1 %
Lymphocytes Relative: 4 %
Lymphs Abs: 0.7 10*3/uL (ref 0.7–4.0)
MCH: 27.9 pg (ref 26.0–34.0)
MCHC: 31.7 g/dL (ref 30.0–36.0)
MCV: 87.9 fL (ref 80.0–100.0)
Monocytes Absolute: 1 10*3/uL (ref 0.1–1.0)
Monocytes Relative: 6 %
Neutro Abs: 16 10*3/uL — ABNORMAL HIGH (ref 1.7–7.7)
Neutrophils Relative %: 89 %
Platelets: 226 10*3/uL (ref 150–400)
RBC: 5.06 MIL/uL (ref 3.87–5.11)
RDW: 17.9 % — ABNORMAL HIGH (ref 11.5–15.5)
WBC: 17.9 10*3/uL — ABNORMAL HIGH (ref 4.0–10.5)
nRBC: 0 % (ref 0.0–0.2)

## 2021-02-22 LAB — TYPE AND SCREEN
ABO/RH(D): A NEG
ABO/RH(D): A NEG
Antibody Screen: NEGATIVE
Antibody Screen: NEGATIVE

## 2021-02-22 LAB — RESP PANEL BY RT-PCR (FLU A&B, COVID) ARPGX2
Influenza A by PCR: NEGATIVE
Influenza B by PCR: NEGATIVE
SARS Coronavirus 2 by RT PCR: NEGATIVE

## 2021-02-22 LAB — HEMOGLOBIN AND HEMATOCRIT, BLOOD
HCT: 42.1 % (ref 36.0–46.0)
HCT: 42.5 % (ref 36.0–46.0)
Hemoglobin: 13.1 g/dL (ref 12.0–15.0)
Hemoglobin: 13.6 g/dL (ref 12.0–15.0)

## 2021-02-22 MED ORDER — HYDROCODONE-ACETAMINOPHEN 5-325 MG PO TABS
1.0000 | ORAL_TABLET | Freq: Four times a day (QID) | ORAL | Status: DC | PRN
  Administered 2021-02-25: 1 via ORAL
  Filled 2021-02-22: qty 1

## 2021-02-22 MED ORDER — TRANEXAMIC ACID-NACL 1000-0.7 MG/100ML-% IV SOLN
1000.0000 mg | Freq: Once | INTRAVENOUS | Status: AC
  Administered 2021-02-22: 1000 mg via INTRAVENOUS
  Filled 2021-02-22: qty 100

## 2021-02-22 MED ORDER — BRIMONIDINE TARTRATE 0.2 % OP SOLN
1.0000 [drp] | Freq: Two times a day (BID) | OPHTHALMIC | Status: DC
  Administered 2021-02-22 – 2021-03-03 (×18): 1 [drp] via OPHTHALMIC
  Filled 2021-02-22: qty 5

## 2021-02-22 MED ORDER — DORZOLAMIDE HCL 2 % OP SOLN: 1.0000 [drp] | Freq: Two times a day (BID) | OPHTHALMIC | Status: DC

## 2021-02-22 MED ORDER — LATANOPROST 0.005 % OP SOLN
1.0000 [drp] | Freq: Every day | OPHTHALMIC | Status: DC
  Administered 2021-02-23 – 2021-03-03 (×10): 1 [drp] via OPHTHALMIC
  Filled 2021-02-22: qty 2.5

## 2021-02-22 MED ORDER — LACTATED RINGERS IV SOLN: INTRAVENOUS | Status: DC

## 2021-02-22 MED ORDER — TRANEXAMIC ACID 1000 MG/10ML IV SOLN
1000.0000 mg | Freq: Once | INTRAVENOUS | Status: AC
  Administered 2021-02-22: 1000 mg via INTRAVENOUS
  Filled 2021-02-22: qty 10

## 2021-02-22 MED ORDER — DILTIAZEM HCL-DEXTROSE 125-5 MG/125ML-% IV SOLN (PREMIX)
5.0000 mg/h | INTRAVENOUS | Status: DC
  Administered 2021-02-22: 5 mg/h via INTRAVENOUS
  Administered 2021-02-23: 10 mg/h via INTRAVENOUS
  Administered 2021-02-23: 7.5 mg/h via INTRAVENOUS
  Administered 2021-02-24: 10 mg/h via INTRAVENOUS
  Filled 2021-02-22 (×5): qty 125

## 2021-02-22 MED ORDER — FUROSEMIDE 10 MG/ML IJ SOLN
20.0000 mg | Freq: Once | INTRAMUSCULAR | Status: AC
  Administered 2021-02-22: 20 mg via INTRAVENOUS
  Filled 2021-02-22: qty 4

## 2021-02-22 MED ORDER — NETARSUDIL DIMESYLATE 0.02 % OP SOLN: 1.0000 [drp] | Freq: Every day | OPHTHALMIC | Status: DC

## 2021-02-22 MED ORDER — DORZOLAMIDE HCL-TIMOLOL MAL 2-0.5 % OP SOLN
1.0000 [drp] | Freq: Two times a day (BID) | OPHTHALMIC | Status: DC
  Administered 2021-02-22 – 2021-03-03 (×18): 1 [drp] via OPHTHALMIC
  Filled 2021-02-22: qty 10

## 2021-02-22 MED ORDER — DILTIAZEM LOAD VIA INFUSION
10.0000 mg | Freq: Once | INTRAVENOUS | Status: DC
  Filled 2021-02-22: qty 10

## 2021-02-22 MED ORDER — HYDROMORPHONE HCL 1 MG/ML IJ SOLN
0.5000 mg | INTRAMUSCULAR | Status: DC | PRN
  Administered 2021-02-22 – 2021-02-25 (×4): 0.5 mg via INTRAVENOUS
  Filled 2021-02-22 (×5): qty 1

## 2021-02-22 NOTE — Progress Notes (Signed)
Pt admitted to [facility] from [facility].  Pt is A&OX4 and neuro intact.  Pt placed on telemetry and CCMD notified. Vitals taken and all within normal range except HR.  Pt is in A-fib w/ RVR with HR fluctuating between 90's to 140's.  Pt has sever pain with movement from pelvic fracture, but is not in pain while lying still. Pt currently comfortable and resting.

## 2021-02-22 NOTE — Consult Note (Signed)
Reason for Consult: Ground level fall Referring Physician: Alcario Drought, DO  Kelli Peterson is an 87 y.o. female.  HPI:  Pt is an 87 yo F who sustained a fall today in the bathroom.  She denies LOC.  She complained of right hip pain.  She lives with one of her daughters.  She moves around with her walker and went to the bathroom to get ready for church.  She fell backwards and landed on her bottom.  She had immediate assistance and was not able to bear weight on the right.  They got her to a wheelchair and brought her to the [facility] long ED.    She is able to do her own ADLs and eats/drinks OK.  She has diastolic CHF and a fib and is on eliquis.  She was having afib with RVR in the ED with soft blood pressures.     Past Medical History:  Diagnosis Date  . Adenomatous colon polyp 2007  . Atrial fibrillation ([place])   . Barrett's esophagus 2007  . 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 Cervical cancer ([place])   . CHF (congestive heart failure) ([place])   . Closed pelvic fracture ([place])   . CVD (cardiovascular disease)   . DVT (deep venous thrombosis) ([place])   . Fibrocystic breast disease   . Fracture of pubic ramus ([place])   . Hyperlipidemia   . Hypertension   . Iron deficiency anemia     Past Surgical History:  Procedure Laterality Date  . CATARACT EXTRACTION Right 02/15/2010   [facility]  . CATARACT EXTRACTION Left 01/30/2010   [facility]  . CATARACT EXTRACTION, BILATERAL  2010  . TOTAL ABDOMINAL HYSTERECTOMY  1964    Family History  Problem Relation Age of Onset  . CVA Father   . CVA Mother     Social History:  reports that she has never smoked. She has never used smokeless tobacco. She reports that she does not drink alcohol and does not use drugs.  Allergies:  Allergies  Allergen Reactions  . Penicillins Swelling and Other (See Comments)    Ankles and feet swell  . Zocor [Simvastatin] Other (See Comments)    Weak and dizzy  . Nsaids Other (See Comments)    Tylenol is all that is permitted     Medications:  Prior to Admission:  Medications Prior to Admission  Medication Sig Dispense Refill Last Dose  . acetaminophen (TYLENOL) 500 MG tablet Take 500 mg by mouth every 6 (six) hours as needed for pain.   Past Week at Unknown time  . apixaban (ELIQUIS) 2.5 MG TABS tablet Take 2.5 mg by mouth 2 (two) times daily.   02/22/2021 at 0600  . atorvastatin (LIPITOR) 20 MG tablet Take 20 mg by mouth daily.   02/22/2021 at Unknown time  . brimonidine (ALPHAGAN) 0.2 % ophthalmic solution Place 1 drop into the right eye 2 (two) times daily.  3 02/22/2021 at Unknown time  . denosumab (PROLIA) 60 MG/ML SOSY injection Inject 60 mg into the skin every 6 (six) months.   Past Month at Unknown time  . diltiazem (CARDIZEM CD) 240 MG 24 hr capsule Take 1 capsule (240 mg total) by mouth daily. 30 capsule 0 02/22/2021 at Unknown time  . dorzolamide-timolol (COSOPT) 22.3-6.8 MG/ML ophthalmic solution Place 1 drop into both eyes 2 (two) times daily.   02/22/2021 at Unknown time  . latanoprost (XALATAN) 0.005 % ophthalmic solution Place 1 drop into both eyes every evening.   02/21/2021 at  Unknown time  . metoprolol succinate (TOPROL-XL) 25 MG 24 hr tablet Take 3 tablets (75 mg total) by mouth daily. 90 tablet 0 02/22/2021 at 0600  . Multiple Vitamin (MULTIVITAMIN WITH MINERALS) TABS tablet Take 1 tablet by mouth daily.   02/22/2021 at Unknown time  . torsemide (DEMADEX) 20 MG tablet Take 1 tablet (20 mg total) by mouth 2 (two) times daily. (Patient taking differently: Take 20-40 mg by mouth See admin instructions. Take 1 tablet by mouth all days EXCEPT on Mon, Wed, and Fri take 1 additional tablet to equal 40mg ) 60 tablet 0 02/22/2021 at Unknown time  . potassium chloride (KLOR-CON) 10 MEQ tablet Take 1 tablet (10 mEq total) by mouth daily. (Patient not taking: Reported on 02/22/2021) 30 tablet 0 Not Taking at Unknown time    Results for orders placed or performed during the hospital encounter of 02/22/21 (from the past  48 hour(s))  Basic metabolic panel     Status: Abnormal   Collection Time: 02/22/21  1:34 PM  Result Value Ref Range   Sodium 144 135 - 145 mmol/L   Potassium 4.0 3.5 - 5.1 mmol/L   Chloride 105 98 - 111 mmol/L   CO2 27 22 - 32 mmol/L   Glucose, Bld 151 (H) 70 - 99 mg/dL    Comment: Glucose reference range applies only to samples taken after fasting for at least 8 hours.   BUN 32 (H) 8 - 23 mg/dL   Creatinine, Ser 1.57 (H) 0.44 - 1.00 mg/dL   Calcium 9.2 8.9 - 10.3 mg/dL   GFR, Estimated 32 (L) >60 mL/min    Comment: (NOTE) Calculated using the CKD-EPI Creatinine Equation (2021)    Anion gap 12 5 - 15    Comment: Performed at [facility], [place] [address], [place], [place] [address]  CBC with Differential     Status: Abnormal   Collection Time: 02/22/21  1:34 PM  Result Value Ref Range   WBC 17.9 (H) 4.0 - 10.5 K/uL   RBC 5.06 3.87 - 5.11 MIL/uL   Hemoglobin 14.1 12.0 - 15.0 g/dL   HCT 44.5 36.0 - 46.0 %   MCV 87.9 80.0 - 100.0 fL   MCH 27.9 26.0 - 34.0 pg   MCHC 31.7 30.0 - 36.0 g/dL   RDW 17.9 (H) 11.5 - 15.5 %   Platelets 226 150 - 400 K/uL   nRBC 0.0 0.0 - 0.2 %   Neutrophils Relative % 89 %   Neutro Abs 16.0 (H) 1.7 - 7.7 K/uL   Lymphocytes Relative 4 %   Lymphs Abs 0.7 0.7 - 4.0 K/uL   Monocytes Relative 6 %   Monocytes Absolute 1.0 0.1 - 1.0 K/uL   Eosinophils Relative 0 %   Eosinophils Absolute 0.0 0.0 - 0.5 K/uL   Basophils Relative 0 %   Basophils Absolute 0.1 0.0 - 0.1 K/uL   Immature Granulocytes 1 %   Abs Immature Granulocytes 0.13 (H) 0.00 - 0.07 K/uL    Comment: Performed at [facility], [place] [address] [place], [place] [address]  Type and screen [place]     Status: None   Collection Time: 02/22/21  1:34 PM  Result Value Ref Range   ABO/RH(D) A NEG    Antibody Screen NEG    Sample Expiration      02/25/2021,2359 Performed at [facility] [address], [place],  [place] [address]   Resp Panel by RT-PCR (Flu  A&B, Covid) Nasopharyngeal Swab     Status: None   Collection Time: 02/22/21  2:50 PM   Specimen: Nasopharyngeal Swab; Nasopharyngeal(NP) swabs in vial transport medium  Result Value Ref Range   SARS Coronavirus 2 by RT PCR NEGATIVE NEGATIVE    Comment: (NOTE) SARS-CoV-2 target nucleic acids are NOT DETECTED.  The SARS-CoV-2 RNA is generally detectable in upper respiratory specimens during the acute phase of infection. The lowest concentration of SARS-CoV-2 viral copies this assay can detect is 138 copies/mL. A negative result does not preclude SARS-Cov-2 infection and should not be used as the sole basis for treatment or other patient management decisions. A negative result may occur with  improper specimen collection/handling, submission of specimen other than nasopharyngeal swab, presence of viral mutation(s) within the areas targeted by this assay, and inadequate number of viral copies(<138 copies/mL). A negative result must be combined with clinical observations, patient history, and epidemiological information. The expected result is Negative.  Fact Sheet for Patients:  EntrepreneurPulse.com.au  Fact Sheet for Healthcare Providers:  IncredibleEmployment.be  This test is no t yet approved or cleared by the [place] FDA and  has been authorized for detection and/or diagnosis of SARS-CoV-2 by FDA under an Emergency Use Authorization (EUA). This EUA will remain  in effect (meaning this test can be used) for the duration of the COVID-19 declaration under Section 564(b)(1) of the Act, 21 U.S.C.section 360bbb-3(b)(1), unless the authorization is terminated  or revoked sooner.       Influenza A by PCR NEGATIVE NEGATIVE   Influenza B by PCR NEGATIVE NEGATIVE    Comment: (NOTE) The Xpert Xpress SARS-CoV-2/FLU/RSV plus assay is intended as an aid in the diagnosis of influenza from Nasopharyngeal swab  specimens and should not be used as a sole basis for treatment. Nasal washings and aspirates are unacceptable for Xpert Xpress SARS-CoV-2/FLU/RSV testing.  Fact Sheet for Patients: EntrepreneurPulse.com.au  Fact Sheet for Healthcare Providers: IncredibleEmployment.be  This test is not yet approved or cleared by the [place] FDA and has been authorized for detection and/or diagnosis of SARS-CoV-2 by FDA under an Emergency Use Authorization (EUA). This EUA will remain in effect (meaning this test can be used) for the duration of the COVID-19 declaration under Section 564(b)(1) of the Act, 21 U.S.C. section 360bbb-3(b)(1), unless the authorization is terminated or revoked.  Performed at [facility], [place] [address], [place], [place] [address]   Hemoglobin and hematocrit, blood     Status: None   Collection Time: 02/22/21  5:00 PM  Result Value Ref Range   Hemoglobin 13.1 12.0 - 15.0 g/dL   HCT 42.1 36.0 - 46.0 %    Comment: Performed at [facility], [place] [address] [place], [place] [address]  Type and screen [place]     Status: None   Collection Time: 02/22/21  7:17 PM  Result Value Ref Range   ABO/RH(D) A NEG    Antibody Screen NEG    Sample Expiration      02/25/2021,2359 Performed at [name], [place] [address] [place], [place] [address]     CT Head Wo Contrast  Result Date: 02/22/2021 CLINICAL DATA:  Fall, head trauma EXAM: CT HEAD WITHOUT CONTRAST TECHNIQUE: Contiguous axial images were obtained from the base of the skull through the vertex without intravenous contrast. COMPARISON:  02/07/2018 FINDINGS: Brain: No evidence of acute infarction, hemorrhage, hydrocephalus, extra-axial collection or mass lesion/mass effect. Advanced low-density changes within the periventricular  and subcortical white matter compatible with chronic microvascular ischemic change. Mild  diffuse cerebral volume loss. Vascular: Atherosclerotic calcifications involving the large vessels of the skull base. No unexpected hyperdense vessel. Skull: Normal. Negative for fracture or focal lesion. Sinuses/Orbits: No acute finding. Other: Negative for scalp hematoma. IMPRESSION: 1. No acute intracranial findings. 2. Chronic microvascular ischemic change and cerebral volume loss. Electronically Signed   By: Davina Poke D.O.   On: 02/22/2021 14:16   CT PELVIS WO CONTRAST  Result Date: 02/22/2021 CLINICAL DATA:  Unwitnessed fall in a bathroom today. Right hip pain. EXAM: CT PELVIS WITHOUT CONTRAST TECHNIQUE: Multidetector CT imaging of the pelvis was performed following the standard protocol without intravenous contrast. COMPARISON:  Current right hip radiographs. FINDINGS: Musculoskeletal: Right pelvic fractures. There is a nondisplaced, coronal oblique fracture extending across the right ilium from the inferior margin of the SI joint 2 the level of the posterosuperior right acetabulum. Another fracture crosses the pubis at its junction with the medial acetabulum, displaced by 5 mm. A third fracture is noted of the inferior pubic ramus at the level of the abductor muscle origins. No other fractures. No bone lesions. Hip joints, SI joints and pubic symphysis are normally aligned. There is soft tissue hemorrhage at lies medial to the right pubis and anterior medial right acetabulum, mildly indenting the bladder. Urinary Tract: No bladder mass, wall thickening or stone. Visualized ureters normal in course and in caliber. Bowel: Rectum moderately distended with stool. Multiple sigmoid colon diverticula. No bowel wall thickening or inflammation. Vascular/Lymphatic: Aortoiliac atherosclerotic calcifications stable from the previous CT. No enlarged lymph nodes. Reproductive:  Status post hysterectomy.  No pelvic masses. Other:  None. IMPRESSION: 1. Fractures of the right hemipelvis as detailed above,  involving the right ileum, junction of the right pubis and anteromedial acetabulum and of the inferior right pubic ramus. Associated extraperitoneal hemorrhage along the right anterior inferior pelvis. 2. No fracture of the right proximal femur. No dislocation. No bone lesions. Electronically Signed   By: Lajean Manes M.D.   On: 02/22/2021 12:27   DG Chest Port 1 View  Result Date: 02/22/2021 CLINICAL DATA:  Fall EXAM: PORTABLE CHEST 1 VIEW COMPARISON:  04/23/2020 FINDINGS: Cardiomegaly, vascular congestion. No overt edema, confluent opacities or effusions. Aortic atherosclerosis. No acute bony abnormality. IMPRESSION: Cardiomegaly, vascular congestion. Aortic atherosclerosis. Electronically Signed   By: Rolm Baptise M.D.   On: 02/22/2021 14:11   DG Hip Unilat  With Pelvis 2-3 Views Right  Result Date: 02/22/2021 CLINICAL DATA:  Unwitnessed fall.  Right hip pain. EXAM: DG HIP (WITH OR WITHOUT PELVIS) 2-3V RIGHT COMPARISON:  02/15/2016. FINDINGS: Fracture of the right pubis adjacent to the medial right acetabulum, minimally displaced, approximately 4 mm. There is a fracture of the inferior pubic, without significant displacement. A subtle fracture line is suggested of the right ilium just lateral to the inferior right SI joint. No other fractures.  Specifically, no right proximal femur fracture. Hip joints, SI joints and pubic symphysis are normally aligned. Skeletal structures are diffusely demineralized. IMPRESSION: 1. Right pelvic fractures. Fracture at the junction of the right pubis with the medial acetabulum, fracture of the inferior right pubic ramus and a questionable fracture of right ilium just lateral to the inferior right SI joint. 2. No right proximal femur fracture.  No dislocation. Electronically Signed   By: Lajean Manes M.D.   On: 02/22/2021 12:08    Review of Systems  Unable to perform ROS: Other   Pt sleeping  Blood pressure 127/82, pulse 91, temperature 98.5 F (36.9 C),  temperature source Oral, resp. rate 20, SpO2 98 %. Physical Exam Constitutional:      General: She is not in acute distress.    Appearance: She is not ill-appearing or diaphoretic.     Comments: Sleeping, snoring. Very thin.  HENT:     Head: Normocephalic.     Right Ear: External ear normal.     Left Ear: External ear normal.     Nose: Nose normal. No congestion.     Mouth/Throat:     Mouth: Mucous membranes are moist.  Eyes:     General: No scleral icterus.    Conjunctiva/sclera: Conjunctivae normal.  Neck:     Vascular: No carotid bruit.  Cardiovascular:     Rate and Rhythm: Normal rate. Rhythm irregular.     Pulses: Normal pulses.     Heart sounds: Normal heart sounds.  Pulmonary:     Effort: Pulmonary effort is normal. No respiratory distress.     Breath sounds: No stridor. No rhonchi.     Comments: Upper airway noises transmitted Chest:     Chest wall: No tenderness.  Abdominal:     General: Abdomen is flat. Bowel sounds are normal. There is no distension.     Palpations: Abdomen is soft. There is no mass.     Tenderness: There is no abdominal tenderness. There is no guarding.  Musculoskeletal:        General: Tenderness, deformity and signs of injury present.     Cervical back: Neck supple. No rigidity or tenderness.     Comments: Right leg externally rotated and shortened. Right hip tender, upper extremity joints non tender, no swelling.  No knee or ankle swelling   Lymphadenopathy:     Cervical: No cervical adenopathy.  Skin:    General: Skin is warm and dry.     Capillary Refill: Capillary refill takes 2 to 3 seconds.     Coloration: Skin is pale.     Findings: Bruising present.     Comments: Ecchymosis on arms c/w blood thinners  Neurological:     Comments: Cannot assess given sleeping and s/p pain medication  Psychiatric:     Comments: Cannot assess at this time.     Assessment/Plan:  Ground level fall Right pelvic fracture (right inferior pubic ramus,  jxn of right pubic ramus and acetabulum and iliac wing) Small associated pelvic hematoma Head CT negative for traumatic injury.  Baseline medical issues CKD Hyperglycemia CHF  Recommend ortho consult for evaluation of pelvic fracture and recs +/- surgery and for weight bearing status.  No evidence for other traumatic injuries.     Stark Klein 02/22/2021, 8:43 PM

## 2021-02-22 NOTE — ED Provider Notes (Signed)
[facility] DEPT Provider Note   CSN: [place]:[id number] Arrival date & time: 02/22/21  1049     History Chief Complaint  Patient presents with  . Fall  . Hip Pain    Kelli Peterson is a 87 y.o. female.  HPI Patient at baseline has mobility with walker she spends most of the day sitting in a chair and uses a walker to go back and forth to the bathroom.  She lives with her daughter.  This morning she was in the bathroom getting ready to go to church.  She lost her balance and fell backwards landing on her buttocks and hip.  She does not believe she struck her head.  She denies headache.  She is on Eliquis.  His daughter heard her fall and went into assist.  With the help of her other sister, they were able to get the patient into a wheelchair and move her to the other room.  She was not able to bear weight.  She has severe pain in her buttock and groin region especially on the right.  He denies perception of weakness or numbness to the extremities.  She denies neck pain.  Patient reports pain in the hips and pelvis is quite severe and would like some pain medication. patient's daughter reports prior to this event patient was at baseline.  She has been eating and drinking without difficulty.  No recent fever chills or general illness.    Past Medical History:  Diagnosis Date  . Adenomatous colon polyp 2007  . Atrial fibrillation ([place])   . Barrett's esophagus 2007  . Cerebellar hemorrhage ([place])   . Cervical cancer ([place])   . CHF (congestive heart failure) ([place])   . Closed pelvic fracture ([place])   . CVD (cardiovascular disease)   . DVT (deep venous thrombosis) ([place])   . Fibrocystic breast disease   . Fracture of pubic ramus ([place])   . Hyperlipidemia   . Hypertension   . Iron deficiency anemia     Patient Active Problem List   Diagnosis Date Noted  . Fall 02/22/2021  . SOB (shortness of breath)   . Acute on chronic diastolic heart failure ([place])   . Pressure  injury of skin 04/17/2020  . Atrial fibrillation with rapid ventricular response ([place])   . Acute on chronic diastolic (congestive) heart failure ([place])   . Pulmonary hypertension ([place])   . Acute kidney injury superimposed on chronic kidney disease ([place])   . CKD (chronic kidney disease), stage III ([place]) 04/15/2020  . Acute hypoxemic respiratory failure ([place]) 04/15/2020  . Branch retinal vein occlusion with macular edema of left eye 01/23/2020  . Branch retinal vein occlusion of right eye 01/23/2020  . Exudative age-related macular degeneration of right eye with inactive choroidal neovascularization ([place]) 01/23/2020  . Intermediate stage nonexudative age-related macular degeneration of left eye 01/23/2020  . Protein-calorie malnutrition, severe 06/02/2018  . AKI (acute kidney injury) ([place]) 05/31/2018  . Dehydration 05/31/2018  . Essential hypertension 05/31/2018  . Atrial fibrillation, chronic ([place]) 05/31/2018  . COPD (chronic obstructive pulmonary disease) ([place]) 05/31/2018  . Glaucoma 05/31/2018  . Hyperlipidemia 05/31/2018  . Acute renal failure (ARF) ([place]) 05/31/2018  . Constipation 05/31/2018  . Acute right hip pain   . Atrial fibrillation with RVR ([place])   . Pelvic fracture, closed, initial encounter 02/15/2016  . Fracture of pubic ramus ([place]) 02/15/2016    Past Surgical History:  Procedure Laterality Date  . CATARACT EXTRACTION Right 02/15/2010  [facility]  . CATARACT EXTRACTION Left 01/30/2010   [facility]  . CATARACT EXTRACTION, BILATERAL  2010  . TOTAL ABDOMINAL HYSTERECTOMY  1964     OB History   No obstetric history on file.     Family History  Problem Relation Age of Onset  . CVA Father   . CVA Mother     Social History   Tobacco Use  . Smoking status: Never Smoker  . Smokeless tobacco: Never Used  Substance Use Topics  . Alcohol use: No  . Drug use: No    Home Medications Prior to Admission medications   Medication Sig Start Date End Date Taking?  Authorizing Provider  acetaminophen (TYLENOL) 500 MG tablet Take 500 mg by mouth every 6 (six) hours as needed for pain.   Yes [provider]  apixaban (ELIQUIS) 2.5 MG TABS tablet Take 2.5 mg by mouth 2 (two) times daily.   Yes [provider]  atorvastatin (LIPITOR) 20 MG tablet Take 20 mg by mouth daily.   Yes [provider]  brimonidine (ALPHAGAN) 0.2 % ophthalmic solution Place 1 drop into the right eye 2 (two) times daily. 05/01/18  Yes [provider]  denosumab (PROLIA) 60 MG/ML SOSY injection Inject 60 mg into the skin every 6 (six) months.   Yes [provider]  diltiazem (CARDIZEM CD) 240 MG 24 hr capsule Take 1 capsule (240 mg total) by mouth daily. 04/25/20 05/25/20 Yes Kyle, Tyrone A, DO  dorzolamide-timolol (COSOPT) 22.3-6.8 MG/ML ophthalmic solution Place 1 drop into both eyes 2 (two) times daily. 02/12/21  Yes [provider]  latanoprost (XALATAN) 0.005 % ophthalmic solution Place 1 drop into both eyes every evening.   Yes [provider]  metoprolol succinate (TOPROL-XL) 25 MG 24 hr tablet Take 3 tablets (75 mg total) by mouth daily. 04/25/20 05/25/20 Yes Kyle, Tyrone A, DO  Multiple Vitamin (MULTIVITAMIN WITH MINERALS) TABS tablet Take 1 tablet by mouth daily.   Yes [provider]  torsemide (DEMADEX) 20 MG tablet Take 1 tablet (20 mg total) by mouth 2 (two) times daily. Patient taking differently: Take 20-40 mg by mouth See admin instructions. Take 1 tablet by mouth all days EXCEPT on Mon, Wed, and Fri take 1 additional tablet to equal 40mg  04/24/20 05/24/20 Yes Kyle, Tyrone A, DO  potassium chloride (KLOR-CON) 10 MEQ tablet Take 1 tablet (10 mEq total) by mouth daily. Patient not taking: Reported on 02/22/2021 04/24/20 05/24/20  Cherylann Ratel A, DO    Allergies    Penicillins, Zocor [simvastatin], and Nsaids  Review of Systems   Review of Systems 10 systems reviewed and negative except as per HPI Physical  Exam Updated Vital Signs BP (!) 139/110   Pulse (!) 126   Temp 98.6 F (37 C) (Oral)   Resp 17   SpO2 99%   Physical Exam Constitutional:      Comments: Patient seems mildly somnolent.  She is answering questions appropriately.  No respiratory distress.  HENT:     Head: Normocephalic and atraumatic.     Nose: Nose normal.     Mouth/Throat:     Comments: Mouth is dry.  She is lying supine on her back.  She is allowing her mouth to hang open slightly Eyes:     Extraocular Movements: Extraocular movements intact.     Pupils: Pupils are equal, round, and reactive to light.  Neck:     Comments: Patient denies C-spine tenderness to palpation Cardiovascular:  Comments: Tachycardia irregularly irregular Pulmonary:     Comments: Breath sounds grossly symmetric no respiratory distress.  Patient Dors is some discomfort to palpation bilateral chest wall.  No crepitus. Abdominal:     General: There is no distension.     Palpations: Abdomen is soft.     Tenderness: There is abdominal tenderness.     Comments: Patient endorses tenderness to palpation in the suprapubic area.  Musculoskeletal:     Comments: Patient has a right lower extremity slightly externally rotated appears slightly shortened.  Feet are cool to the touch.  1+ dorsalis pedis pulses bilaterally.  No edema of the lower extremities.  She has some minor skin tears at the right wrist.  No deformity of the wrist.  No pain with range of motion bilateral upper extremities  Skin:    General: Skin is warm and dry.  Neurological:     Comments: Patient is awake and alert.  He is answering questions appropriately.  She seems mildly cognitive delayed or mildly somnolent.  Patient appears to have some facial droop or allowing her jaw to list open.  She does respond appropriately however and is situationally oriented.  Bilateral upper extremity grip strength symmetric.  Limited neurologic exam of lower extremities due to pain with any  motion of the pelvis.  Psychiatric:        Mood and Affect: Mood normal.     ED Results / Procedures / Treatments   Labs (all labs ordered are listed, but only abnormal results are displayed) Labs Reviewed  BASIC METABOLIC PANEL - Abnormal; Notable for the following components:      Result Value   Glucose, Bld 151 (*)    BUN 32 (*)    Creatinine, Ser 1.57 (*)    GFR, Estimated 32 (*)    All other components within normal limits  CBC WITH DIFFERENTIAL/PLATELET - Abnormal; Notable for the following components:   WBC 17.9 (*)    RDW 17.9 (*)    Neutro Abs 16.0 (*)    Abs Immature Granulocytes 0.13 (*)    All other components within normal limits  RESP PANEL BY RT-PCR (FLU A&B, COVID) ARPGX2  HEMOGLOBIN AND HEMATOCRIT, BLOOD  HEMOGLOBIN AND HEMATOCRIT, BLOOD  CBC  BASIC METABOLIC PANEL  TYPE AND SCREEN    EKG EKG Interpretation  Date/Time:  Sunday Feb 22 2021 14:30:36 EDT Ventricular Rate:  133 PR Interval:    QRS Duration: 102 QT Interval:  344 QTC Calculation: 512 R Axis:   220 Text Interpretation: Atrial fibrillation Right axis deviation ST depression, probably rate related this a repeat within 3 minutes. no change Confirmed by Charlesetta Shanks [phone]) on 02/22/2021 5:13:10 PM   Radiology CT Head Wo Contrast  Result Date: 02/22/2021 CLINICAL DATA:  Fall, head trauma EXAM: CT HEAD WITHOUT CONTRAST TECHNIQUE: Contiguous axial images were obtained from the base of the skull through the vertex without intravenous contrast. COMPARISON:  02/07/2018 FINDINGS: Brain: No evidence of acute infarction, hemorrhage, hydrocephalus, extra-axial collection or mass lesion/mass effect. Advanced low-density changes within the periventricular and subcortical white matter compatible with chronic microvascular ischemic change. Mild diffuse cerebral volume loss. Vascular: Atherosclerotic calcifications involving the large vessels of the skull base. No unexpected hyperdense vessel. Skull: Normal.  Negative for fracture or focal lesion. Sinuses/Orbits: No acute finding. Other: Negative for scalp hematoma. IMPRESSION: 1. No acute intracranial findings. 2. Chronic microvascular ischemic change and cerebral volume loss. Electronically Signed   By: Davina Poke D.O.   On:  02/22/2021 14:16   CT PELVIS WO CONTRAST  Result Date: 02/22/2021 CLINICAL DATA:  Unwitnessed fall in a bathroom today. Right hip pain. EXAM: CT PELVIS WITHOUT CONTRAST TECHNIQUE: Multidetector CT imaging of the pelvis was performed following the standard protocol without intravenous contrast. COMPARISON:  Current right hip radiographs. FINDINGS: Musculoskeletal: Right pelvic fractures. There is a nondisplaced, coronal oblique fracture extending across the right ilium from the inferior margin of the SI joint 2 the level of the posterosuperior right acetabulum. Another fracture crosses the pubis at its junction with the medial acetabulum, displaced by 5 mm. A third fracture is noted of the inferior pubic ramus at the level of the abductor muscle origins. No other fractures. No bone lesions. Hip joints, SI joints and pubic symphysis are normally aligned. There is soft tissue hemorrhage at lies medial to the right pubis and anterior medial right acetabulum, mildly indenting the bladder. Urinary Tract: No bladder mass, wall thickening or stone. Visualized ureters normal in course and in caliber. Bowel: Rectum moderately distended with stool. Multiple sigmoid colon diverticula. No bowel wall thickening or inflammation. Vascular/Lymphatic: Aortoiliac atherosclerotic calcifications stable from the previous CT. No enlarged lymph nodes. Reproductive:  Status post hysterectomy.  No pelvic masses. Other:  None. IMPRESSION: 1. Fractures of the right hemipelvis as detailed above, involving the right ileum, junction of the right pubis and anteromedial acetabulum and of the inferior right pubic ramus. Associated extraperitoneal hemorrhage along the  right anterior inferior pelvis. 2. No fracture of the right proximal femur. No dislocation. No bone lesions. Electronically Signed   By: Lajean Manes M.D.   On: 02/22/2021 12:27   DG Chest Port 1 View  Result Date: 02/22/2021 CLINICAL DATA:  Fall EXAM: PORTABLE CHEST 1 VIEW COMPARISON:  04/23/2020 FINDINGS: Cardiomegaly, vascular congestion. No overt edema, confluent opacities or effusions. Aortic atherosclerosis. No acute bony abnormality. IMPRESSION: Cardiomegaly, vascular congestion. Aortic atherosclerosis. Electronically Signed   By: Rolm Baptise M.D.   On: 02/22/2021 14:11   DG Hip Unilat  With Pelvis 2-3 Views Right  Result Date: 02/22/2021 CLINICAL DATA:  Unwitnessed fall.  Right hip pain. EXAM: DG HIP (WITH OR WITHOUT PELVIS) 2-3V RIGHT COMPARISON:  02/15/2016. FINDINGS: Fracture of the right pubis adjacent to the medial right acetabulum, minimally displaced, approximately 4 mm. There is a fracture of the inferior pubic, without significant displacement. A subtle fracture line is suggested of the right ilium just lateral to the inferior right SI joint. No other fractures.  Specifically, no right proximal femur fracture. Hip joints, SI joints and pubic symphysis are normally aligned. Skeletal structures are diffusely demineralized. IMPRESSION: 1. Right pelvic fractures. Fracture at the junction of the right pubis with the medial acetabulum, fracture of the inferior right pubic ramus and a questionable fracture of right ilium just lateral to the inferior right SI joint. 2. No right proximal femur fracture.  No dislocation. Electronically Signed   By: Lajean Manes M.D.   On: 02/22/2021 12:08    Procedures Procedures  CRITICAL CARE Performed by: Charlesetta Shanks   Total critical care time: 30 minutes  Critical care time was exclusive of separately billable procedures and treating other patients.  Critical care was necessary to treat or prevent imminent or life-threatening  deterioration.  Critical care was time spent personally by me on the following activities: development of treatment plan with patient and/or surrogate as well as nursing, discussions with consultants, evaluation of patient's response to treatment, examination of patient, obtaining history from patient or surrogate, ordering  and performing treatments and interventions, ordering and review of laboratory studies, ordering and review of radiographic studies, pulse oximetry and re-evaluation of patient's condition. Medications Ordered in ED Medications  HYDROmorphone (DILAUDID) injection 0.5 mg (0.5 mg Intravenous Given 02/22/21 1330)  tranexamic acid (CYKLOKAPRON) IVPB 1,000 mg (0 mg Intravenous Stopped 02/22/21 1516)    Followed by  tranexamic acid (CYKLOKAPRON) 1,000 mg in sodium chloride 0.9 % 500 mL infusion (1,000 mg Intravenous New Bag/Given 02/22/21 1515)  lactated ringers infusion (0 mLs Intravenous Hold 02/22/21 1517)  brimonidine (ALPHAGAN) 0.2 % ophthalmic solution 1 drop (has no administration in time range)  latanoprost (XALATAN) 0.005 % ophthalmic solution 1 drop (has no administration in time range)  HYDROcodone-acetaminophen (NORCO/VICODIN) 5-325 MG per tablet 1 tablet (has no administration in time range)  diltiazem (CARDIZEM) 125 mg in dextrose 5% 125 mL (1 mg/mL) infusion (12.5 mg/hr Intravenous Rate/Dose Change 02/22/21 1712)  dorzolamide-timolol (COSOPT) 22.3-6.8 MG/ML ophthalmic solution 1 drop (has no administration in time range)  furosemide (LASIX) injection 20 mg (20 mg Intravenous Given 02/22/21 1531)    ED Course  I have reviewed the triage vital signs and the nursing notes.  Pertinent labs & imaging results that were available during my care of the patient were reviewed by me and considered in my medical decision making (see chart for details).  Clinical Course as of 02/22/21 1715  Sun Feb 22, 2021  1355 Consult: Reviewed with Dr. Doran Durand orthopedics.  Recommends using  tranexamic acid and consultation with trauma surgery for transfer to [facility]. [MP]  8469 Consult: Reviewed with Dr. Barry Dienes trauma surgery.  She advises admission to medical service at [facility] for ground-level fall with isolated orthopedic injuries and consultation to trauma surgery as needed at [facility]. [MP]  1409 Consult:reviewed with Dr. Neysa Bonito for admission. [MP]    Clinical Course User Index [MP] Charlesetta Shanks, MD   MDM Rules/Calculators/A&P                          Patient had mechanical fall from ground-level.  She has several fractures within the pelvis.  She is anticoagulated on Eliquis and there is pelvic hematoma.  Patient started on TXA per consultation with Dr. Doran Durand orthopedics.  No evidence of head trauma, CT negative.  I suspect elevated heart rate secondary to atrial fibrillation with rapid response.  Patient's blood pressures are consistently over 629 systolic.  Patient will need close monitoring for risk of ongoing intra-abdominal bleeding from anticoagulation and pelvic fractures.  Consultation made for admission. Final Clinical Impression(s) / ED Diagnoses Final diagnoses:  Closed displaced fracture of pelvis, unspecified part of pelvis, initial encounter ([place])  Pelvic hematoma in female  Anticoagulated  Atrial fibrillation with rapid ventricular response [facility])    Rx / DC Orders ED Discharge Orders    None       Charlesetta Shanks, MD 02/22/21 1717

## 2021-02-22 NOTE — Progress Notes (Signed)
Orthopedic Tech Progress Note Patient Details:  Kelli Peterson June 29, 1930 [id number]  Patient ID: Kelli Peterson, female   DOB: [birth year], 87 y.o.   MRN: [id number] Pt unable to have ohf due to being outside of limitations.  [name] 02/22/2021, 9:55 PM

## 2021-02-22 NOTE — H&P (Addendum)
History and Physical        Hospital Admission Note Date: 02/22/2021  Patient name: Kelli Peterson Medical record number: [id number] Date of birth: 15-Sep-1930 Age: 87 y.o. Gender: female  PCP: [name], DO   Chief Complaint    Chief Complaint  Patient presents with  . Fall  . Hip Pain      HPI:   This is an 87 year old female with past medical history of atrial fibrillation on Eliquis, CAD, CKD 3A, hypertension, hyperlipidemia, DVT, chronic diastolic CHF who presented to the ED from home with an unwitnessed fall today in the bathroom and right hip pain.  History obtained from patient's daughter at bedside.  She states that the patient lives with her and ambulates with a walker.  Patient was in the bathroom this a.m. when the daughter believes she may have a lost her balance and fell backwards but is unsure.  She does not believe the patient lost consciousness.  The patient is currently drowsy from the Dilaudid and does not recall the events well.  The patient denies any symptoms of chest pain or shortness of breath or anything other than right hip pain at this time.  ED Course: Afebrile, tachycardic, tachypneic,  hemodynamically stable, hypoxic (SpO2 89% on room air) placed on 3 L/min. Notable Labs: Sodium 144, K4.0, glucose 151, BUN 32, creatinine 1.57, WBC 17.9, Hb 14.1, platelets 226. Notable Imaging: Right hip CT- fractures of the right hemipelvis involving the right ilium, junction of the right pubis and anterior medial acetabulum and inferior right pubic ramus associated with extraperitoneal hemorrhage along the right anterior inferior pelvis.  CXR-cardiomegaly with vascular congestion.  Orthopedic surgery was consulted by the ED provider who recommended TXA and [facility] to [facility].  Patient received Dilaudid, TXA and LR maintenance.    Vitals:   02/22/21 1730 02/22/21 1738   BP: (!) 133/104   Pulse: (!) 50 (!) 143  Resp: (!) 24   Temp:    SpO2: 100% 99%     Review of Systems:  Review of Systems  All other systems reviewed and are negative.   Medical/Social/Family History   Past Medical History: Past Medical History:  Diagnosis Date  . Adenomatous colon polyp 2007  . Atrial fibrillation ([place])   . Barrett's esophagus 2007  . 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 Cervical cancer ([place])   . CHF (congestive heart failure) ([place])   . Closed pelvic fracture ([place])   . CVD (cardiovascular disease)   . DVT (deep venous thrombosis) ([place])   . Fibrocystic breast disease   . Fracture of pubic ramus ([place])   . Hyperlipidemia   . Hypertension   . Iron deficiency anemia     Past Surgical History:  Procedure Laterality Date  . CATARACT EXTRACTION Right 02/15/2010   [facility]  . CATARACT EXTRACTION Left 01/30/2010   [facility]  . CATARACT EXTRACTION, BILATERAL  2010  . TOTAL ABDOMINAL HYSTERECTOMY  1964    Medications: Prior to Admission medications   Medication Sig Start Date End Date Taking? Authorizing Provider  acetaminophen (TYLENOL) 500 MG tablet Take 500 mg by mouth every 6 (six) hours as needed for pain.    [provider]  apixaban Arne [place])  2.5 MG TABS tablet Take 2.5 mg by mouth 2 (two) times daily.    [provider]  atorvastatin (LIPITOR) 20 MG tablet Take 20 mg by mouth daily.    [provider]  brimonidine (ALPHAGAN) 0.2 % ophthalmic solution Place 1 drop into the right eye 2 (two) times daily. 05/01/18   [provider]  denosumab (PROLIA) 60 MG/ML SOSY injection Inject 60 mg into the skin every 6 (six) months.    [provider]  diltiazem (CARDIZEM CD) 240 MG 24 hr capsule Take 1 capsule (240 mg total) by mouth daily. 04/25/20 05/25/20  Cherylann Ratel A, DO  dorzolamide (TRUSOPT) 2 % ophthalmic solution Place 1 drop into both eyes 2 (two) times daily.     [provider]  guaiFENesin (MUCINEX) 600  MG 12 hr tablet Take 600 mg by mouth 2 (two) times daily as needed for to loosen phlegm.    [provider]  latanoprost (XALATAN) 0.005 % ophthalmic solution Place 1 drop into both eyes daily at 12 noon.     [provider]  metoprolol succinate (TOPROL-XL) 25 MG 24 hr tablet Take 3 tablets (75 mg total) by mouth daily. 04/25/20 05/25/20  Cherylann Ratel A, DO  Multiple Vitamin (MULTIVITAMIN WITH MINERALS) TABS tablet Take 1 tablet by mouth daily.    [provider]  Netarsudil Dimesylate (RHOPRESSA) 0.02 % SOLN Place 1 drop into the right eye daily.     [provider]  potassium chloride (KLOR-CON) 10 MEQ tablet Take 1 tablet (10 mEq total) by mouth daily. 04/24/20 05/24/20  Cherylann Ratel A, DO  torsemide (DEMADEX) 20 MG tablet Take 1 tablet (20 mg total) by mouth 2 (two) times daily. 04/24/20 05/24/20  Cherylann Ratel A, DO    Allergies:   Allergies  Allergen Reactions  . Penicillins Swelling and Other (See Comments)    Ankles and feet swell  . Zocor [Simvastatin] Other (See Comments)    Weak and dizzy  . Nsaids Other (See Comments)    Tylenol is all that is permitted    Social History:  reports that she has never smoked. She has never used smokeless tobacco. She reports that she does not drink alcohol and does not use drugs.  Family History: Family History  Problem Relation Age of Onset  . CVA Father   . CVA Mother      Objective   Physical Exam: Blood pressure (!) 133/104, pulse (!) 143, temperature 98.6 F (37 C), temperature source Oral, resp. rate (!) 24, SpO2 99 %.  Physical Exam Vitals and nursing note reviewed.  Constitutional:      Appearance: Normal appearance.     Comments: Drowsy  HENT:     Head: Normocephalic and atraumatic.  Eyes:     Conjunctiva/sclera: Conjunctivae normal.  Cardiovascular:     Rate and Rhythm: Tachycardia present. Rhythm irregular.  Pulmonary:     Effort: Pulmonary effort is normal. No respiratory distress.      Comments: Unable to auscultate posterior chest Now abnormal breath sounds on anterior chest Abdominal:     General: Abdomen is flat.     Palpations: Abdomen is soft.  Musculoskeletal:     Right lower leg: No edema.     Left lower leg: No edema.     Comments: No ecchymosis of the right hip noted Right lower extremity externally rotated  Skin:    Coloration: Skin is not jaundiced or pale.  Neurological:     General: No  focal deficit present.     Mental Status: She is alert.  Psychiatric:        Mood and Affect: Mood normal.        Behavior: Behavior normal.     LABS on Admission: I have personally reviewed all the labs and imaging below    Basic Metabolic Panel: Recent Labs  Lab 02/22/21 1334  NA 144  K 4.0  CL 105  CO2 27  GLUCOSE 151*  BUN 32*  CREATININE 1.57*  CALCIUM 9.2   Liver Function Tests: No results for input(s): AST, ALT, ALKPHOS, BILITOT, PROT, ALBUMIN in the last 168 hours. No results for input(s): LIPASE, AMYLASE in the last 168 hours. No results for input(s): AMMONIA in the last 168 hours. CBC: Recent Labs  Lab 02/22/21 1334 02/22/21 1700  WBC 17.9*  --   NEUTROABS 16.0*  --   HGB 14.1 13.1  HCT 44.5 42.1  MCV 87.9  --   PLT 226  --    Cardiac Enzymes: No results for input(s): CKTOTAL, CKMB, CKMBINDEX, TROPONINI in the last 168 hours. BNP: Invalid input(s): POCBNP CBG: No results for input(s): GLUCAP in the last 168 hours.  Radiological Exams on Admission:  CT Head Wo Contrast  Result Date: 02/22/2021 CLINICAL DATA:  Fall, head trauma EXAM: CT HEAD WITHOUT CONTRAST TECHNIQUE: Contiguous axial images were obtained from the base of the skull through the vertex without intravenous contrast. COMPARISON:  02/07/2018 FINDINGS: Brain: No evidence of acute infarction, hemorrhage, hydrocephalus, extra-axial collection or mass lesion/mass effect. Advanced low-density changes within the periventricular and subcortical white matter compatible with  chronic microvascular ischemic change. Mild diffuse cerebral volume loss. Vascular: Atherosclerotic calcifications involving the large vessels of the skull base. No unexpected hyperdense vessel. Skull: Normal. Negative for fracture or focal lesion. Sinuses/Orbits: No acute finding. Other: Negative for scalp hematoma. IMPRESSION: 1. No acute intracranial findings. 2. Chronic microvascular ischemic change and cerebral volume loss. Electronically Signed   By: Davina Poke D.O.   On: 02/22/2021 14:16   CT PELVIS WO CONTRAST  Result Date: 02/22/2021 CLINICAL DATA:  Unwitnessed fall in a bathroom today. Right hip pain. EXAM: CT PELVIS WITHOUT CONTRAST TECHNIQUE: Multidetector CT imaging of the pelvis was performed following the standard protocol without intravenous contrast. COMPARISON:  Current right hip radiographs. FINDINGS: Musculoskeletal: Right pelvic fractures. There is a nondisplaced, coronal oblique fracture extending across the right ilium from the inferior margin of the SI joint 2 the level of the posterosuperior right acetabulum. Another fracture crosses the pubis at its junction with the medial acetabulum, displaced by 5 mm. A third fracture is noted of the inferior pubic ramus at the level of the abductor muscle origins. No other fractures. No bone lesions. Hip joints, SI joints and pubic symphysis are normally aligned. There is soft tissue hemorrhage at lies medial to the right pubis and anterior medial right acetabulum, mildly indenting the bladder. Urinary Tract: No bladder mass, wall thickening or stone. Visualized ureters normal in course and in caliber. Bowel: Rectum moderately distended with stool. Multiple sigmoid colon diverticula. No bowel wall thickening or inflammation. Vascular/Lymphatic: Aortoiliac atherosclerotic calcifications stable from the previous CT. No enlarged lymph nodes. Reproductive:  Status post hysterectomy.  No pelvic masses. Other:  None. IMPRESSION: 1. Fractures of the  right hemipelvis as detailed above, involving the right ileum, junction of the right pubis and anteromedial acetabulum and of the inferior right pubic ramus. Associated extraperitoneal hemorrhage along the right anterior inferior pelvis. 2. No fracture  of the right proximal femur. No dislocation. No bone lesions. Electronically Signed   By: Lajean Manes M.D.   On: 02/22/2021 12:27   DG Chest Port 1 View  Result Date: 02/22/2021 CLINICAL DATA:  Fall EXAM: PORTABLE CHEST 1 VIEW COMPARISON:  04/23/2020 FINDINGS: Cardiomegaly, vascular congestion. No overt edema, confluent opacities or effusions. Aortic atherosclerosis. No acute bony abnormality. IMPRESSION: Cardiomegaly, vascular congestion. Aortic atherosclerosis. Electronically Signed   By: Rolm Baptise M.D.   On: 02/22/2021 14:11   DG Hip Unilat  With Pelvis 2-3 Views Right  Result Date: 02/22/2021 CLINICAL DATA:  Unwitnessed fall.  Right hip pain. EXAM: DG HIP (WITH OR WITHOUT PELVIS) 2-3V RIGHT COMPARISON:  02/15/2016. FINDINGS: Fracture of the right pubis adjacent to the medial right acetabulum, minimally displaced, approximately 4 mm. There is a fracture of the inferior pubic, without significant displacement. A subtle fracture line is suggested of the right ilium just lateral to the inferior right SI joint. No other fractures.  Specifically, no right proximal femur fracture. Hip joints, SI joints and pubic symphysis are normally aligned. Skeletal structures are diffusely demineralized. IMPRESSION: 1. Right pelvic fractures. Fracture at the junction of the right pubis with the medial acetabulum, fracture of the inferior right pubic ramus and a questionable fracture of right ilium just lateral to the inferior right SI joint. 2. No right proximal femur fracture.  No dislocation. Electronically Signed   By: Lajean Manes M.D.   On: 02/22/2021 12:08      EKG: Atrial fibrillation with RVR rate 133   A & P   Principal Problem:   Hip fracture,  unspecified laterality, closed, initial encounter ([place]) Active Problems:   CKD (chronic kidney disease), stage III (HCC)   Acute hypoxemic respiratory failure (HCC)   Atrial fibrillation with rapid ventricular response ([place])   Fall   1. Fractures of the right hemipelvis involving the right ilium, junction of the right pubis and anterior medial acetabulum and inferior right pubic ramus associated with extraperitoneal hemorrhage along the right anterior inferior pelvis s/p mechanical fall from standing height a. Ambulatory with a walker at home and lives with daughter b. ED provider discussed with Dr. Doran Durand, Ortho who recommended TXA and trauma surgery consult with transfer to [facility].  ED provider discussed with Dr. Barry Dienes, trauma surgery, who advised trauma surgery consult at [facility] c. Discussed patient's cardiac history and fractures with Dr. Marisue Ivan, cardiology, who did not recommend any further intervention other than those outlined below and was okay with proceeding with surgery d. Patient on Eliquis.  Hold Eliquis and hold Kcentra at this time as hemoglobin is stable and BP is hypertensive.  Will repeat Hb level, closely monitor vitals and reevaluate reversal as needed e. Make n.p.o. and transfer to [facility]  2. Atrial fibrillation with RVR a. Rates in 130s to 150s at bedside b. On Eliquis at home, will hold in lieu of anticipated upcoming surgery c. Cardizem drip, hold bolus to prevent hypotension while getting Lasix below  3. Acute hypoxic respiratory failure, likely mild volume overload secondary to tachyarrhythmia a. SpO2 89% on room air, CXR with pulmonary vascular congestion b. Echo 6 months ago: EF 50 to 55% c. Will give low-dose Lasix 20 mg IV x1 d. A. fib treatment as above  4. Hypertension a. On cardizem drip  5. Hyperlipidemia a. Holding statin while n.p.o.  6. Elevated creatinine in the setting of CKD 3a a. Slight bump in creatinine from baseline, currently 1.57 and baseline is 1.0  likely from tachyarrhythmia b. Treatment as above c. Hold QT prolonging agents   DVT prophylaxis: SCDs   Code Status: Full Code  Diet: N.p.o. Family Communication: Admission, patients condition and plan of care including tests being ordered have been discussed with the patient who indicates understanding and agrees with the plan and Code Status. Patient's daughter was updated  Disposition Plan: The appropriate patient status for this patient is INPATIENT. Inpatient status is judged to be reasonable and necessary in order to provide the required intensity of service to ensure the patient's safety. The patient's presenting symptoms, physical exam findings, and initial radiographic and laboratory data in the context of their chronic comorbidities is felt to place them at high risk for further clinical deterioration. Furthermore, it is not anticipated that the patient will be medically stable for discharge from the hospital within 2 midnights of admission. The following factors support the patient status of inpatient.   " The patient's presenting symptoms include fall. " The worrisome physical exam findings include right hip pain. " The initial radiographic and laboratory data are worrisome because of right hip fracture, A. fib with RVR. " The chronic co-morbidities include A. fib, hypertension, hyperlipidemia.   * I certify that at the point of admission it is my clinical judgment that the patient will require inpatient hospital care spanning beyond 2 midnights from the point of admission due to high intensity of service, high risk for further deterioration and high frequency of surveillance required.*   The medical decision making on this patient was of high complexity and the patient is at high risk for clinical deterioration, therefore this is a level 3  admission.  Consultants  . Ortho and Trauma surgery consulted by the ED. Will need formal consults once she is at [facility] . Discussed with  cardiology over the phone  Procedures  . TXA  Time Spent on Admission: 75 minutes    Harold Hedge, DO [facility]  02/22/2021, 6:43 PM

## 2021-02-22 NOTE — ED Notes (Signed)
Attempted to call report; Charge RN states she needs a note from the ED physician to accept her to the floor.

## 2021-02-22 NOTE — Progress Notes (Signed)
Ortho tech service paged awaiting for return call.

## 2021-02-22 NOTE — ED Triage Notes (Signed)
Patient here from home reporting unwitnessed fall today in bathroom. Reports right hip pain 10/10.

## 2021-02-23 ENCOUNTER — Inpatient Hospital Stay (HOSPITAL_COMMUNITY): Payer: Medicare Other

## 2021-02-23 DIAGNOSIS — S72009A Fracture of unspecified part of neck of unspecified femur, initial encounter for closed fracture: Secondary | ICD-10-CM | POA: Diagnosis not present

## 2021-02-23 LAB — CBC
HCT: 40.5 % (ref 36.0–46.0)
Hemoglobin: 13 g/dL (ref 12.0–15.0)
MCH: 27.8 pg (ref 26.0–34.0)
MCHC: 32.1 g/dL (ref 30.0–36.0)
MCV: 86.7 fL (ref 80.0–100.0)
Platelets: 189 10*3/uL (ref 150–400)
RBC: 4.67 MIL/uL (ref 3.87–5.11)
RDW: 17.2 % — ABNORMAL HIGH (ref 11.5–15.5)
WBC: 15.8 10*3/uL — ABNORMAL HIGH (ref 4.0–10.5)
nRBC: 0 % (ref 0.0–0.2)

## 2021-02-23 LAB — BASIC METABOLIC PANEL
Anion gap: 10 (ref 5–15)
Anion gap: 13 (ref 5–15)
BUN: 34 mg/dL — ABNORMAL HIGH (ref 8–23)
BUN: 50 mg/dL — ABNORMAL HIGH (ref 8–23)
CO2: 24 mmol/L (ref 22–32)
CO2: 20 mmol/L — ABNORMAL LOW (ref 22–32)
Calcium: 8.5 mg/dL — ABNORMAL LOW (ref 8.9–10.3)
Calcium: 8.3 mg/dL — ABNORMAL LOW (ref 8.9–10.3)
Chloride: 107 mmol/L (ref 98–111)
Chloride: 102 mmol/L (ref 98–111)
Creatinine, Ser: 1.84 mg/dL — ABNORMAL HIGH (ref 0.44–1.00)
Creatinine, Ser: 2.04 mg/dL — ABNORMAL HIGH (ref 0.44–1.00)
GFR, Estimated: 26 mL/min — ABNORMAL LOW (ref >60)
GFR, Estimated: 23 mL/min — ABNORMAL LOW (ref >60)
Glucose, Bld: 144 mg/dL — ABNORMAL HIGH (ref 70–99)
Glucose, Bld: 133 mg/dL — ABNORMAL HIGH (ref 70–99)
Potassium: 4.8 mmol/L (ref 3.5–5.1)
Potassium: 4.4 mmol/L (ref 3.5–5.1)
Sodium: 141 mmol/L (ref 135–145)
Sodium: 135 mmol/L (ref 135–145)

## 2021-02-23 LAB — BRAIN NATRIURETIC PEPTIDE: B Natriuretic Peptide: 220.2 pg/mL — ABNORMAL HIGH (ref 0.0–100.0)

## 2021-02-23 MED ORDER — ADULT MULTIVITAMIN W/MINERALS CH
1.0000 | ORAL_TABLET | Freq: Every day | ORAL | Status: DC
  Administered 2021-02-23: 1 via ORAL
  Filled 2021-02-23: qty 1

## 2021-02-23 MED ORDER — ACETAMINOPHEN 325 MG PO TABS
650.0000 mg | ORAL_TABLET | Freq: Four times a day (QID) | ORAL | Status: DC | PRN
  Administered 2021-02-24 – 2021-02-25 (×2): 650 mg via ORAL
  Filled 2021-02-23 (×2): qty 2

## 2021-02-23 MED ORDER — ATORVASTATIN CALCIUM 10 MG PO TABS
20.0000 mg | ORAL_TABLET | Freq: Every day | ORAL | Status: DC
  Administered 2021-02-23: 20 mg via ORAL
  Filled 2021-02-23: qty 2

## 2021-02-23 MED ORDER — LACTATED RINGERS IV SOLN: INTRAVENOUS | Status: AC

## 2021-02-23 MED ORDER — APIXABAN 2.5 MG PO TABS
2.5000 mg | ORAL_TABLET | Freq: Two times a day (BID) | ORAL | Status: DC
  Administered 2021-02-23: 2.5 mg via ORAL
  Filled 2021-02-23: qty 1

## 2021-02-23 MED ORDER — ENSURE ENLIVE PO LIQD
237.0000 mL | ORAL | Status: DC
  Administered 2021-02-23 – 2021-03-03 (×8): 237 mL via ORAL

## 2021-02-23 MED ORDER — SODIUM CHLORIDE 0.9 % IV SOLN: INTRAVENOUS | Status: DC

## 2021-02-23 NOTE — Progress Notes (Signed)
Initial Nutrition Assessment  DOCUMENTATION CODES:  Severe malnutrition in context of chronic illness  INTERVENTION:  Continue regular diet.  Add Ensure Enlive po daily, each supplement provides 350 kcal and 20 grams of protein.  Add Magic cup TID with meals, each supplement provides 290 kcal and 9 grams of protein.  Add MVI with minerals daily.  NUTRITION DIAGNOSIS:  Severe Malnutrition related to chronic illness as evidenced by energy intake < 75% for > or equal to 1 month,moderate fat depletion,severe fat depletion,moderate muscle depletion,severe muscle depletion,percent weight loss.  GOAL:  Patient will meet greater than or equal to 90% of their needs  MONITOR:  PO intake,Supplement acceptance,Labs,Weight trends,I & O's  REASON FOR ASSESSMENT:  Consult Hip fracture protocol  ASSESSMENT:  86 yo female with a PMH of A-fib, CAD, CKD stage 3a, HTN, HLD, DVT, and chronic diastolic CHF who presents with R hip fracture after a fall at home.  Spoke with pt and daughter at bedside. Daughter reports pt eats consistently and her appetite is so-so, but improving. She eats grits and coffee for breakfast. For lunch, she has crackers, yogurt, and tea. For dinner, she has a home-cooked meal from the daughter (not the one present) she lives with. Daughter reports she has had Boost and Ensure before, but it is difficult for her to get her to drink more than one occasionally. She also reports that she is trying to get the pt to drink more water, and that is slowly progressing. She reports that pt is set in her ways.  Pt reports a 10 lb weight loss (8%) in 1 month, which is significant and severe.  On exam, pt has very little muscle and fat storage left. Some of these depletions are likely due to age, but some are due to limited PO intake.  Given above information, pt is severely malnourished chronically.  Recommend adding Ensure Enlive daily and Magic Cup (vanilla) TID, as well as MVI with  minerals.  Medications: reviewed; LR @ 50 ml/hr, MVI with minerals  Labs: reviewed; Glucose 144  NUTRITION - FOCUSED PHYSICAL EXAM: Flowsheet Row Most Recent Value  Orbital Region Moderate depletion  Upper Arm Region Severe depletion  Thoracic and Lumbar Region Moderate depletion  Buccal Region Severe depletion  Temple Region Severe depletion  Clavicle Bone Region Severe depletion  Clavicle and Acromion Bone Region Severe depletion  Scapular Bone Region Unable to assess  Dorsal Hand Severe depletion  Patellar Region Severe depletion  Anterior Thigh Region Severe depletion  Posterior Calf Region Severe depletion  Edema (RD Assessment) None  Hair Reviewed  Eyes Reviewed  Mouth Reviewed  Skin Reviewed  Nails Reviewed     Diet Order:   Diet Order            Diet regular Room service appropriate? Yes; Fluid consistency: Thin  Diet effective now                EDUCATION NEEDS:  Education needs have been addressed  Skin:  Skin Assessment: Reviewed RN Assessment  Last BM:  02/22/21 (pt reported)  Height:  Ht Readings from Last 1 Encounters:  04/21/20 5' (1.524 m)   Weight:  Wt Readings from Last 1 Encounters:  02/23/21 48.4 kg   Ideal Body Weight:  45.5 kg  BMI:  Body mass index is 20.84 kg/m.  Estimated Nutritional Needs:  Kcal:  1400-1600 Protein:  60-75 grams Fluid:  >1.4 L  Derrel Nip, RD, LDN Registered Dietitian After Hours/Weekend Pager # in [place]

## 2021-02-23 NOTE — Progress Notes (Addendum)
[facility] Trauma Progress Note     Subjective: CC: left-sided pain.   Alert and oriented this morning. Reports right-sided pain, specifically RLE pain. Denies chest pain, SOB, face/head pain, dizziness, upper extremity pain, LLE pain, nausea, or vomiting. At baseline she states she lives with her daughter in [place] and mobilizing using a walker. States she does not want to get OOB bc it hurts.    Objective: Vital signs in last 24 hours: Temp:  [97.4 F (36.3 C)-98.6 F (37 C)] 97.6 F (36.4 C) (05/23 0358) Pulse Rate:  [32-152] 87 (05/23 0358) Resp:  [12-31] 18 (05/23 0358) BP: (102-156)/(63-118) 131/97 (05/23 0358) SpO2:  [89 %-100 %] 99 % (05/23 0358) Weight:  [48.4 kg] 48.4 kg (05/23 0358)    Intake/Output from previous day: 05/22 0701 - 05/23 0700 In: 632.6 [I.V.:136.8; IV Piggyback:495.8] Out: -  Intake/Output this shift: No intake/output data recorded.  PE: Gen:  Alert, NAD, pleasant and cooperative Card:  Regular rate, pedal pulses 2+ BL Pulm:  Normal effort, clear to auscultation bilaterally, non-tender chest wall Abd: Soft, non-tender, non-distended, bowel sounds present in all 4 quadrants, no HSM Skin: warm and dry, no rashes  MSK: upper extremities NVI bilaterally, grip strength 5/5 bilaterally, no point tenderness over clavicles, shoulders, elbows, wrists;  Lower extremities NVI, pt will able to perform flexion/extension of toes and ankles but will not flex R knee or R hip. No significant R knee effusion, small contusion R knee. Not crepitus with passing ROM R knee. R hip tenderness without significant swelling.  Psych: A&Ox3 oriented to person, [place], 2022, fall. Neuro: following commands, no focal deficit   Lab Results:  Recent Labs    02/22/21 1334 02/22/21 1700 02/22/21 2255 02/23/21 0212  WBC 17.9*  --   --  15.8*  HGB 14.1   < > 13.6 13.0  HCT 44.5   < > 42.5 40.5  PLT 226  --   --  189   < > = values in this interval not  displayed.   BMET Recent Labs    02/22/21 1334 02/23/21 0212  NA 144 141  K 4.0 4.8  CL 105 107  CO2 27 24  GLUCOSE 151* 144*  BUN 32* 34*  CREATININE 1.57* 1.84*  CALCIUM 9.2 8.5*   PT/INR No results for input(s): LABPROT, INR in the last 72 hours. CMP     Component Value Date/Time   NA 141 02/23/2021 0212   NA 142 02/20/2016 0000   K 4.8 02/23/2021 0212   CL 107 02/23/2021 0212   CO2 24 02/23/2021 0212   GLUCOSE 144 (H) 02/23/2021 0212   BUN 34 (H) 02/23/2021 0212   BUN 15 02/20/2016 0000   CREATININE 1.84 (H) 02/23/2021 0212   CALCIUM 8.5 (L) 02/23/2021 0212   PROT 6.6 04/23/2020 0634   ALBUMIN 2.4 (L) 04/23/2020 0634   AST 50 (H) 04/23/2020 0634   ALT 36 04/23/2020 0634   ALKPHOS 46 04/23/2020 0634   BILITOT 0.4 04/23/2020 0634   GFRNONAA 26 (L) 02/23/2021 0212   GFRAA 53 (L) 04/24/2020 0424   Lipase  No results found for: LIPASE     Studies/Results: CT Head Wo Contrast  Result Date: 02/22/2021 CLINICAL DATA:  Fall, head trauma EXAM: CT HEAD WITHOUT CONTRAST TECHNIQUE: Contiguous axial images were obtained from the base of the skull through the vertex without intravenous contrast. COMPARISON:  02/07/2018 FINDINGS: Brain: No evidence of acute infarction, hemorrhage, hydrocephalus, extra-axial collection or mass  lesion/mass effect. Advanced low-density changes within the periventricular and subcortical white matter compatible with chronic microvascular ischemic change. Mild diffuse cerebral volume loss. Vascular: Atherosclerotic calcifications involving the large vessels of the skull base. No unexpected hyperdense vessel. Skull: Normal. Negative for fracture or focal lesion. Sinuses/Orbits: No acute finding. Other: Negative for scalp hematoma. IMPRESSION: 1. No acute intracranial findings. 2. Chronic microvascular ischemic change and cerebral volume loss. Electronically Signed   By: Davina Poke D.O.   On: 02/22/2021 14:16   CT PELVIS WO CONTRAST  Result  Date: 02/22/2021 CLINICAL DATA:  Unwitnessed fall in a bathroom today. Right hip pain. EXAM: CT PELVIS WITHOUT CONTRAST TECHNIQUE: Multidetector CT imaging of the pelvis was performed following the standard protocol without intravenous contrast. COMPARISON:  Current right hip radiographs. FINDINGS: Musculoskeletal: Right pelvic fractures. There is a nondisplaced, coronal oblique fracture extending across the right ilium from the inferior margin of the SI joint 2 the level of the posterosuperior right acetabulum. Another fracture crosses the pubis at its junction with the medial acetabulum, displaced by 5 mm. A third fracture is noted of the inferior pubic ramus at the level of the abductor muscle origins. No other fractures. No bone lesions. Hip joints, SI joints and pubic symphysis are normally aligned. There is soft tissue hemorrhage at lies medial to the right pubis and anterior medial right acetabulum, mildly indenting the bladder. Urinary Tract: No bladder mass, wall thickening or stone. Visualized ureters normal in course and in caliber. Bowel: Rectum moderately distended with stool. Multiple sigmoid colon diverticula. No bowel wall thickening or inflammation. Vascular/Lymphatic: Aortoiliac atherosclerotic calcifications stable from the previous CT. No enlarged lymph nodes. Reproductive:  Status post hysterectomy.  No pelvic masses. Other:  None. IMPRESSION: 1. Fractures of the right hemipelvis as detailed above, involving the right ileum, junction of the right pubis and anteromedial acetabulum and of the inferior right pubic ramus. Associated extraperitoneal hemorrhage along the right anterior inferior pelvis. 2. No fracture of the right proximal femur. No dislocation. No bone lesions. Electronically Signed   By: Lajean Manes M.D.   On: 02/22/2021 12:27   DG Chest Port 1 View  Result Date: 02/22/2021 CLINICAL DATA:  Fall EXAM: PORTABLE CHEST 1 VIEW COMPARISON:  04/23/2020 FINDINGS: Cardiomegaly, vascular  congestion. No overt edema, confluent opacities or effusions. Aortic atherosclerosis. No acute bony abnormality. IMPRESSION: Cardiomegaly, vascular congestion. Aortic atherosclerosis. Electronically Signed   By: Rolm Baptise M.D.   On: 02/22/2021 14:11   DG Hip Unilat  With Pelvis 2-3 Views Right  Result Date: 02/22/2021 CLINICAL DATA:  Unwitnessed fall.  Right hip pain. EXAM: DG HIP (WITH OR WITHOUT PELVIS) 2-3V RIGHT COMPARISON:  02/15/2016. FINDINGS: Fracture of the right pubis adjacent to the medial right acetabulum, minimally displaced, approximately 4 mm. There is a fracture of the inferior pubic, without significant displacement. A subtle fracture line is suggested of the right ilium just lateral to the inferior right SI joint. No other fractures.  Specifically, no right proximal femur fracture. Hip joints, SI joints and pubic symphysis are normally aligned. Skeletal structures are diffusely demineralized. IMPRESSION: 1. Right pelvic fractures. Fracture at the junction of the right pubis with the medial acetabulum, fracture of the inferior right pubic ramus and a questionable fracture of right ilium just lateral to the inferior right SI joint. 2. No right proximal femur fracture.  No dislocation. Electronically Signed   By: Lajean Manes M.D.   On: 02/22/2021 12:08    Anti-infectives: Anti-infectives (From admission, onward)  None       Assessment/Plan CAD CKD III HTN HLD afib with RVR, on Eliquis at home - Eliquis held, now rate controlled on Cardizem gtt.  Ground level fall - no acute intracranial findings. CXR negative.  Right pelvic fracture (right inferior pubic ramus, jxn of right pubic ramus and acetabulum and iliac wing). Small associated pelvic hematoma.    Afebrile, hemodynamically stable. Hgb/hct 13/40 from 13.6/21 - stable. No new complaints this morning, no new injuries identified on tertiary trauma survey. Low suspicion for R knee bony injury but given patients  reported pain, small contusion, and inability to demonstrate movement I have ordered a portable x-ray. Management of pelvic Fx per orthopedic surgery. PT/OT/cognitive therapies. Ok for a diet from trauma perspective. Trauma will sign off. Please call as needed.    LOS: 1 day    Obie Dredge, [facility] Please see Amion for pager number during day hours 7:00am-4:30pm

## 2021-02-23 NOTE — Progress Notes (Signed)
   02/23/21 1110  Clinical Encounter Type  Visited With Patient and family together  Visit Type Initial;Spiritual support  Referral From Nurse  Consult/Referral To [place] responded to the consult for Kelli Peterson.  Her daughter was in the room and we talked on how their prayer groups prayed for Kelli Peterson.  Chaplain had an opportunity to laugh and talk with Kelli Peterson.  Such a pleasant visit.  Chaplain Nicklas Mcsweeney [place] [phone]

## 2021-02-23 NOTE — NC FL2 (Signed)
[place] LEVEL OF CARE SCREENING TOOL     IDENTIFICATION  Patient Name: Kelli Peterson Birthdate: [birth year] Sex: female Admission Date (Current Location): 02/22/2021  [facility] and [place] Number:  Herbalist and Address:  [facility] [place] [address], [place], [place] [address]      Provider Number: [id number]  Attending Physician Name and Address:  Antonieta Pert, MD  Relative Name and Phone Number:  Debbe Odea (Daughter)   [phone] [facility])    Current Level of Care: Hospital Recommended Level of Care: [place] Prior Approval Number:    Date Approved/Denied:   PASRR Number: [id number]  Discharge Plan: SNF    Current Diagnoses: Patient Active Problem List   Diagnosis Date Noted  . Fall 02/22/2021  . Hip fracture, unspecified laterality, closed, initial encounter ([place]) 02/22/2021  . SOB (shortness of breath)   . Acute on chronic diastolic heart failure ([place])   . Pressure injury of skin 04/17/2020  . Atrial fibrillation with rapid ventricular response ([place])   . Acute on chronic diastolic (congestive) heart failure ([place])   . Pulmonary hypertension ([place])   . Acute kidney injury superimposed on chronic kidney disease ([place])   . CKD (chronic kidney disease), stage III ([place]) 04/15/2020  . Acute hypoxemic respiratory failure ([place]) 04/15/2020  . Branch retinal vein occlusion with macular edema of left eye 01/23/2020  . Branch retinal vein occlusion of right eye 01/23/2020  . Exudative age-related macular degeneration of right eye with inactive choroidal neovascularization ([place]) 01/23/2020  . Intermediate stage nonexudative age-related macular degeneration of left eye 01/23/2020  . Protein-calorie malnutrition, severe 06/02/2018  . AKI (acute kidney injury) ([place]) 05/31/2018  . Dehydration 05/31/2018  . Essential hypertension 05/31/2018  . Atrial fibrillation, chronic ([place]) 05/31/2018  . COPD (chronic  obstructive pulmonary disease) ([place]) 05/31/2018  . Glaucoma 05/31/2018  . Hyperlipidemia 05/31/2018  . Acute renal failure (ARF) ([place]) 05/31/2018  . Constipation 05/31/2018  . Acute right hip pain   . Atrial fibrillation with RVR ([place])   . Pelvic fracture, closed, initial encounter 02/15/2016  . Fracture of pubic ramus ([place]) 02/15/2016    Orientation RESPIRATION BLADDER Height & Weight     Self,Situation,Place  O2 (4L [place]) Incontinent,External catheter Weight: 106 lb 11.2 oz (48.4 kg) Height:     BEHAVIORAL SYMPTOMS/MOOD NEUROLOGICAL BOWEL NUTRITION STATUS      Continent Diet (see d/c summary)  AMBULATORY STATUS COMMUNICATION OF NEEDS Skin   Extensive Assist Verbally Normal                       Personal Care Assistance Level of Assistance  Bathing,Feeding,Dressing Bathing Assistance: Limited assistance Feeding assistance: Independent Dressing Assistance: Limited assistance     Functional Limitations Info  Sight,Hearing,Speech Sight Info: Adequate Hearing Info: Impaired Speech Info: Impaired (Hard to understand)    SPECIAL CARE FACTORS FREQUENCY  PT (By licensed PT),OT (By licensed OT)     PT Frequency: 5x/week OT Frequency: 5x/week            Contractures Contractures Info: Not present    Additional Factors Info  Code Status,Allergies Code Status Info: Full CODE Allergies Info: Zocor (simvastatin), penecillins, nsaids           Current Medications (02/23/2021):  This is the current hospital active medication list Current Facility-Administered Medications  Medication Dose Route Frequency Provider Last Rate Last Admin  . apixaban (ELIQUIS) tablet 2.5 mg  2.5 mg Oral BID  Antonieta Pert, MD   2.5 mg at 02/23/21 0959  . atorvastatin (LIPITOR) tablet 20 mg  20 mg Oral Daily Kc, Ramesh, MD      . brimonidine (ALPHAGAN) 0.2 % ophthalmic solution 1 drop  1 drop Right Eye BID Harold Hedge, MD   1 drop at 02/23/21 0802  . diltiazem (CARDIZEM) 125 mg in dextrose  5% 125 mL (1 mg/mL) infusion  5-15 mg/hr Intravenous Titrated Harold Hedge, MD 7.5 mL/hr at 02/23/21 1132 7.5 mg/hr at 02/23/21 1132  . dorzolamide-timolol (COSOPT) 22.3-6.8 MG/ML ophthalmic solution 1 drop  1 drop Both Eyes BID Harold Hedge, MD   1 drop at 02/23/21 0801  . feeding supplement (ENSURE ENLIVE / ENSURE PLUS) liquid 237 mL  237 mL Oral Q24H Kc, Ramesh, MD   237 mL at 02/23/21 1102  . HYDROcodone-acetaminophen (NORCO/VICODIN) 5-325 MG per tablet 1 tablet  1 tablet Oral Q6H PRN Harold Hedge, MD      . HYDROmorphone (DILAUDID) injection 0.5 mg  0.5 mg Intravenous Q3H PRN Harold Hedge, MD   0.5 mg at 02/22/21 1807  . lactated ringers infusion   Intravenous Continuous Harold Hedge, MD   Held at 02/22/21 1517  . latanoprost (XALATAN) 0.005 % ophthalmic solution 1 drop  1 drop Both Eyes Q1200 Harold Hedge, MD   1 drop at 02/23/21 1120  . multivitamin with minerals tablet 1 tablet  1 tablet Oral Daily Antonieta Pert, MD   1 tablet at 02/23/21 4315     Discharge Medications: Please see discharge summary for a list of discharge medications.  Relevant Imaging Results:  Relevant Lab Results:   Additional Information [phone] covid vaccinated x2, no booster  [employer], LCSW

## 2021-02-23 NOTE — Evaluation (Signed)
Physical Therapy Evaluation Patient Details Name: Kelli Peterson MRN: [id number] DOB: [birth year] Today's Date: 02/23/2021   History of Present Illness  Patient is a 87 y/o female who presents with right pelvic fxs (right inferior pubic ramus, junction of right pubic ramus and acetabulum and iliac wing) s/p fall, also noted to have A-fib with RVR and elevated creatinine. CXR-vascular congestion. PMH includes HTN, CHF, A-fib, cervical ca, CAD, CKD stage III, DVT, AAA.  Clinical Impression  Patient presents with pain, generalized weakness, dyspnea, decreased activity tolerance and impaired mobility s/p above. Pt lives at home with her 2 daughters and is a minimal household ambulator with RW. Today, pt requires Max A for bed mobility, Mod A for standing and unable to take any steps due to pain. Not able to stand fully upright. Pt with audible crackles heard with activity and 2/4 DOE noted with minimal activity. Sp02 dropped to low 80s on supplemental 02, cues for pursed lip breathing however pt is a mouth breather at baseline and denies SOB despite being visually SOB. Would benefit from SNF to maximize independence and mobility prior to return home. Will follow acutely.    Follow Up Recommendations SNF;Supervision for mobility/OOB    Equipment Recommendations  None recommended by PT    Recommendations for Other Services       Precautions / Restrictions Precautions Precautions: Fall;Other (comment) Precaution Comments: watch 02 Restrictions Weight Bearing Restrictions: Yes RLE Weight Bearing: Weight bearing as tolerated Other Position/Activity Restrictions: WBAT BLEs      Mobility  Bed Mobility Overal bed mobility: Needs Assistance Bed Mobility: Supine to Sit;Sit to Supine     Supine to sit: HOB elevated;Max assist Sit to supine: Max assist;+2 for physical assistance   General bed mobility comments: Able to bring LEs to EOB with assist and step by step cues, use of rail and assist  with trunk and scooting bottom. Increased time. Assist to bring LEs and lower trunk to return to supine.    Transfers Overall transfer level: Needs assistance Equipment used: Rolling walker (2 wheeled) Transfers: Sit to/from Stand Sit to Stand: Mod assist         General transfer comment: ASsist to power to standing with cues for hand placement/technique, Not able to stand fully upright, heavy reliance on UEs and RW as well as therapist.  Ambulation/Gait             General Gait Details: Unable  Stairs            Wheelchair Mobility    Modified Rankin (Stroke Patients Only)       Balance Overall balance assessment: Needs assistance Sitting-balance support: Feet supported;No upper extremity supported Sitting balance-Leahy Scale: Fair Sitting balance - Comments: supervision for safety.   Standing balance support: During functional activity Standing balance-Leahy Scale: Poor Standing balance comment: Requires UE support and external support in standing. Flexed at hips/trunk, unable to get upright.                             Pertinent Vitals/Pain Pain Assessment: Faces Faces Pain Scale: Hurts whole lot Pain Location: right pelvis worsened with movement Pain Descriptors / Indicators: Grimacing;Guarding;Discomfort;Moaning Pain Intervention(s): Monitored during session;Repositioned;Limited activity within patient's tolerance    Home Living Family/patient expects to be discharged to:: Skilled nursing facility Living Arrangements: Children Available Help at Discharge: Family;Available PRN/intermittently Type of Home: House Home Access: Stairs to enter   Entrance Stairs-Number of Steps: 1 Home  Layout: One level Home Equipment: Walker - 2 wheels;Cane - single point;Wheelchair - [employer] - 4 wheels      Prior Function Level of Independence: Independent with assistive device(s)         Comments: Uses RW for ambulation     Hand  Dominance   Dominant Hand: Right    Extremity/Trunk Assessment   Upper Extremity Assessment Upper Extremity Assessment: Defer to OT evaluation    Lower Extremity Assessment Lower Extremity Assessment: Generalized weakness (Difficult to fully assess due to pain with movement but likely at least 3+/5 throughout)    Cervical / Trunk Assessment Cervical / Trunk Assessment: Kyphotic  Communication   Communication: HOH  Cognition Arousal/Alertness: Awake/alert Behavior During Therapy: WFL for tasks assessed/performed Overall Cognitive Status: No family/caregiver present to determine baseline cognitive functioning                                 General Comments: Follows commands well. Reluctant to move due to pain.      General Comments General comments (skin integrity, edema, etc.): Daughter present during session. Sp02 dropped to low 80s on supplemental 02, cues for pursed lip breathing however pt is a mouth breather at baseline and denies SOB despite being visually SOB.    Exercises     Assessment/Plan    PT Assessment Patient needs continued PT services  PT Problem List Decreased strength;Decreased mobility;Decreased range of motion;Cardiopulmonary status limiting activity;Decreased cognition;Decreased activity tolerance;Decreased balance;Impaired sensation;Pain       PT Treatment Interventions Therapeutic exercise;Patient/family education;Therapeutic activities;Functional mobility training;Gait training;Balance training;Wheelchair mobility training    PT Goals (Current goals can be found in the Care Plan section)  Acute Rehab PT Goals Patient Stated Goal: improve pain PT Goal Formulation: With patient Time For Goal Achievement: 03/09/21 Potential to Achieve Goals: Fair    Frequency Min 3X/week   Barriers to discharge Decreased caregiver support      Co-evaluation               AM-PAC PT "6 Clicks" Mobility  Outcome Measure Help needed  turning from your back to your side while in a flat bed without using bedrails?: A Lot Help needed moving from lying on your back to sitting on the side of a flat bed without using bedrails?: A Lot Help needed moving to and from a bed to a chair (including a wheelchair)?: A Lot Help needed standing up from a chair using your arms (e.g., wheelchair or bedside chair)?: A Lot Help needed to walk in hospital room?: A Lot Help needed climbing 3-5 steps with a railing? : Total 6 Click Score: 11    End of Session Equipment Utilized During Treatment: Oxygen Activity Tolerance: Patient limited by pain Patient left: in bed;with call bell/phone within reach;with bed alarm set;with SCD's reapplied;with family/visitor present;Other (comment) (RT present) Nurse Communication: Mobility status PT Visit Diagnosis: Pain;Muscle weakness (generalized) (M62.81);Difficulty in walking, not elsewhere classified (R26.2);Unsteadiness on feet (R26.81) Pain - Right/Left: Right Pain - part of body:  (pelvis)    Time: 1884-1660 PT Time Calculation (min) (ACUTE ONLY): 23 min   Charges:   PT Evaluation $PT Eval Moderate Complexity: 1 Mod PT Treatments $Therapeutic Activity: 8-22 mins        Marisa Severin, PT, DPT Acute Rehabilitation Services Pager [phone] Office [phone]      Marguarite Arbour A Sabra Heck 02/23/2021, 12:57 PM

## 2021-02-23 NOTE — Plan of Care (Signed)
POC initiated and progressing. 

## 2021-02-23 NOTE — Consult Note (Signed)
Reason for Consult:Pelvic fxs Referring Physician: Doristine Counter Time called: 0730 Time at bedside: [place] is an 87 y.o. female.  HPI: Kelli Peterson was in the bathroom getting ready for church yesterday when she fell over. She's unsure what caused the fall. She had immediate right hip/leg pain and could not get up. She was brought to the ED where x-ray showed multiple pelvic fxs and orthopedic surgery was consulted. She lives at home with her daughter and ambulates with a RW.  Past Medical History:  Diagnosis Date  . Adenomatous colon polyp 2007  . Atrial fibrillation ([place])   . Barrett's esophagus 2007  . 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 Cervical cancer ([place])   . CHF (congestive heart failure) ([place])   . Closed pelvic fracture ([place])   . CVD (cardiovascular disease)   . DVT (deep venous thrombosis) ([place])   . Fibrocystic breast disease   . Fracture of pubic ramus ([place])   . Hyperlipidemia   . Hypertension   . Iron deficiency anemia     Past Surgical History:  Procedure Laterality Date  . CATARACT EXTRACTION Right 02/15/2010   [facility]  . CATARACT EXTRACTION Left 01/30/2010   [facility]  . CATARACT EXTRACTION, BILATERAL  2010  . TOTAL ABDOMINAL HYSTERECTOMY  1964    Family History  Problem Relation Age of Onset  . CVA Father   . CVA Mother     Social History:  reports that she has never smoked. She has never used smokeless tobacco. She reports that she does not drink alcohol and does not use drugs.  Allergies:  Allergies  Allergen Reactions  . Penicillins Swelling and Other (See Comments)    Ankles and feet swell  . Zocor [Simvastatin] Other (See Comments)    Weak and dizzy  . Nsaids Other (See Comments)    Tylenol is all that is permitted    Medications: I have reviewed the patient's current medications.  Results for orders placed or performed during the hospital encounter of 02/22/21 (from the past 48 hour(s))  Basic metabolic panel     Status: Abnormal   Collection  Time: 02/22/21  1:34 PM  Result Value Ref Range   Sodium 144 135 - 145 mmol/L   Potassium 4.0 3.5 - 5.1 mmol/L   Chloride 105 98 - 111 mmol/L   CO2 27 22 - 32 mmol/L   Glucose, Bld 151 (H) 70 - 99 mg/dL    Comment: Glucose reference range applies only to samples taken after fasting for at least 8 hours.   BUN 32 (H) 8 - 23 mg/dL   Creatinine, Ser 1.57 (H) 0.44 - 1.00 mg/dL   Calcium 9.2 8.9 - 10.3 mg/dL   GFR, Estimated 32 (L) >60 mL/min    Comment: (NOTE) Calculated using the CKD-EPI Creatinine Equation (2021)    Anion gap 12 5 - 15    Comment: Performed at [facility], [place] [address] [place], [place] [address]  CBC with Differential     Status: Abnormal   Collection Time: 02/22/21  1:34 PM  Result Value Ref Range   WBC 17.9 (H) 4.0 - 10.5 K/uL   RBC 5.06 3.87 - 5.11 MIL/uL   Hemoglobin 14.1 12.0 - 15.0 g/dL   HCT 44.5 36.0 - 46.0 %   MCV 87.9 80.0 - 100.0 fL   MCH 27.9 26.0 - 34.0 pg   MCHC 31.7 30.0 - 36.0 g/dL   RDW 17.9 (H) 11.5 - 15.5 %  Platelets 226 150 - 400 K/uL   nRBC 0.0 0.0 - 0.2 %   Neutrophils Relative % 89 %   Neutro Abs 16.0 (H) 1.7 - 7.7 K/uL   Lymphocytes Relative 4 %   Lymphs Abs 0.7 0.7 - 4.0 K/uL   Monocytes Relative 6 %   Monocytes Absolute 1.0 0.1 - 1.0 K/uL   Eosinophils Relative 0 %   Eosinophils Absolute 0.0 0.0 - 0.5 K/uL   Basophils Relative 0 %   Basophils Absolute 0.1 0.0 - 0.1 K/uL   Immature Granulocytes 1 %   Abs Immature Granulocytes 0.13 (H) 0.00 - 0.07 K/uL    Comment: Performed at [facility], [place] [address] [place], [place] [address]  Type and screen [place]     Status: None   Collection Time: 02/22/21  1:34 PM  Result Value Ref Range   ABO/RH(D) A NEG    Antibody Screen NEG    Sample Expiration      02/25/2021,2359 Performed at [place] [address] [place], [place] [address]   Resp Panel by RT-PCR (Flu A&B, Covid) Nasopharyngeal Swab      Status: None   Collection Time: 02/22/21  2:50 PM   Specimen: Nasopharyngeal Swab; Nasopharyngeal(NP) swabs in vial transport medium  Result Value Ref Range   SARS Coronavirus 2 by RT PCR NEGATIVE NEGATIVE    Comment: (NOTE) SARS-CoV-2 target nucleic acids are NOT DETECTED.  The SARS-CoV-2 RNA is generally detectable in upper respiratory specimens during the acute phase of infection. The lowest concentration of SARS-CoV-2 viral copies this assay can detect is 138 copies/mL. A negative result does not preclude SARS-Cov-2 infection and should not be used as the sole basis for treatment or other patient management decisions. A negative result may occur with  improper specimen collection/handling, submission of specimen other than nasopharyngeal swab, presence of viral mutation(s) within the areas targeted by this assay, and inadequate number of viral copies(<138 copies/mL). A negative result must be combined with clinical observations, patient history, and epidemiological information. The expected result is Negative.  Fact Sheet for Patients:  EntrepreneurPulse.com.au  Fact Sheet for Healthcare Providers:  IncredibleEmployment.be  This test is no t yet approved or cleared by the [place] FDA and  has been authorized for detection and/or diagnosis of SARS-CoV-2 by FDA under an Emergency Use Authorization (EUA). This EUA will remain  in effect (meaning this test can be used) for the duration of the COVID-19 declaration under Section 564(b)(1) of the Act, 21 U.S.C.section 360bbb-3(b)(1), unless the authorization is terminated  or revoked sooner.       Influenza A by PCR NEGATIVE NEGATIVE   Influenza B by PCR NEGATIVE NEGATIVE    Comment: (NOTE) The Xpert Xpress SARS-CoV-2/FLU/RSV plus assay is intended as an aid in the diagnosis of influenza from Nasopharyngeal swab specimens and should not be used as a sole basis for treatment. Nasal  washings and aspirates are unacceptable for Xpert Xpress SARS-CoV-2/FLU/RSV testing.  Fact Sheet for Patients: EntrepreneurPulse.com.au  Fact Sheet for Healthcare Providers: IncredibleEmployment.be  This test is not yet approved or cleared by the [place] FDA and has been authorized for detection and/or diagnosis of SARS-CoV-2 by FDA under an Emergency Use Authorization (EUA). This EUA will remain in effect (meaning this test can be used) for the duration of the COVID-19 declaration under Section 564(b)(1) of the Act, 21 U.S.C. section 360bbb-3(b)(1), unless the authorization is terminated or revoked.  Performed at [facility]  [place] [address] [place], [place] [address]   Hemoglobin and hematocrit, blood     Status: None   Collection Time: 02/22/21  5:00 PM  Result Value Ref Range   Hemoglobin 13.1 12.0 - 15.0 g/dL   HCT 42.1 36.0 - 46.0 %    Comment: Performed at [facility], [place] [address] [place], [place] [address]  Type and screen [place]     Status: None   Collection Time: 02/22/21  7:17 PM  Result Value Ref Range   ABO/RH(D) A NEG    Antibody Screen NEG    Sample Expiration      02/25/2021,2359 Performed at [facility], [place] [address] [place], [place] [address]   Hemoglobin and hematocrit, blood     Status: None   Collection Time: 02/22/21 10:55 PM  Result Value Ref Range   Hemoglobin 13.6 12.0 - 15.0 g/dL   HCT 42.5 36.0 - 46.0 %    Comment: Performed at [facility], [place] [address], [place], [place] [address]  CBC     Status: Abnormal   Collection Time: 02/23/21  2:12 AM  Result Value Ref Range   WBC 15.8 (H) 4.0 - 10.5 K/uL   RBC 4.67 3.87 - 5.11 MIL/uL   Hemoglobin 13.0 12.0 - 15.0 g/dL   HCT 40.5 36.0 - 46.0 %   MCV 86.7 80.0 - 100.0 fL   MCH 27.8 26.0 - 34.0 pg   MCHC 32.1 30.0 - 36.0 g/dL   RDW 17.2 (H) 11.5 - 15.5 %   Platelets 189 150  - 400 K/uL   nRBC 0.0 0.0 - 0.2 %    Comment: Performed at [facility], [place] [address] [place], [place] [address]  Basic metabolic panel     Status: Abnormal   Collection Time: 02/23/21  2:12 AM  Result Value Ref Range   Sodium 141 135 - 145 mmol/L   Potassium 4.8 3.5 - 5.1 mmol/L   Chloride 107 98 - 111 mmol/L   CO2 24 22 - 32 mmol/L   Glucose, Bld 144 (H) 70 - 99 mg/dL    Comment: Glucose reference range applies only to samples taken after fasting for at least 8 hours.   BUN 34 (H) 8 - 23 mg/dL   Creatinine, Ser 1.84 (H) 0.44 - 1.00 mg/dL   Calcium 8.5 (L) 8.9 - 10.3 mg/dL   GFR, Estimated 26 (L) >60 mL/min    Comment: (NOTE) Calculated using the CKD-EPI Creatinine Equation (2021)    Anion gap 10 5 - 15    Comment: Performed at [facility] [address], [place], [place] [address]    CT Head Wo Contrast  Result Date: 02/22/2021 CLINICAL DATA:  Fall, head trauma EXAM: CT HEAD WITHOUT CONTRAST TECHNIQUE: Contiguous axial images were obtained from the base of the skull through the vertex without intravenous contrast. COMPARISON:  02/07/2018 FINDINGS: Brain: No evidence of acute infarction, hemorrhage, hydrocephalus, extra-axial collection or mass lesion/mass effect. Advanced low-density changes within the periventricular and subcortical white matter compatible with chronic microvascular ischemic change. Mild diffuse cerebral volume loss. Vascular: Atherosclerotic calcifications involving the large vessels of the skull base. No unexpected hyperdense vessel. Skull: Normal. Negative for fracture or focal lesion. Sinuses/Orbits: No acute finding. Other: Negative for scalp hematoma. IMPRESSION: 1. No acute intracranial findings. 2. Chronic microvascular ischemic change and cerebral volume loss. Electronically Signed   By: Davina Poke  D.O.   On: 02/22/2021 14:16   CT PELVIS WO CONTRAST  Result Date: 02/22/2021 CLINICAL DATA:  Unwitnessed fall in a bathroom today. Right hip  pain. EXAM: CT PELVIS WITHOUT CONTRAST TECHNIQUE: Multidetector CT imaging of the pelvis was performed following the standard protocol without intravenous contrast. COMPARISON:  Current right hip radiographs. FINDINGS: Musculoskeletal: Right pelvic fractures. There is a nondisplaced, coronal oblique fracture extending across the right ilium from the inferior margin of the SI joint 2 the level of the posterosuperior right acetabulum. Another fracture crosses the pubis at its junction with the medial acetabulum, displaced by 5 mm. A third fracture is noted of the inferior pubic ramus at the level of the abductor muscle origins. No other fractures. No bone lesions. Hip joints, SI joints and pubic symphysis are normally aligned. There is soft tissue hemorrhage at lies medial to the right pubis and anterior medial right acetabulum, mildly indenting the bladder. Urinary Tract: No bladder mass, wall thickening or stone. Visualized ureters normal in course and in caliber. Bowel: Rectum moderately distended with stool. Multiple sigmoid colon diverticula. No bowel wall thickening or inflammation. Vascular/Lymphatic: Aortoiliac atherosclerotic calcifications stable from the previous CT. No enlarged lymph nodes. Reproductive:  Status post hysterectomy.  No pelvic masses. Other:  None. IMPRESSION: 1. Fractures of the right hemipelvis as detailed above, involving the right ileum, junction of the right pubis and anteromedial acetabulum and of the inferior right pubic ramus. Associated extraperitoneal hemorrhage along the right anterior inferior pelvis. 2. No fracture of the right proximal femur. No dislocation. No bone lesions. Electronically Signed   By: Lajean Manes M.D.   On: 02/22/2021 12:27   DG Chest Port 1 View  Result Date: 02/22/2021 CLINICAL DATA:  Fall EXAM: PORTABLE CHEST 1 VIEW COMPARISON:  04/23/2020 FINDINGS: Cardiomegaly, vascular congestion. No overt edema, confluent opacities or effusions. Aortic  atherosclerosis. No acute bony abnormality. IMPRESSION: Cardiomegaly, vascular congestion. Aortic atherosclerosis. Electronically Signed   By: Rolm Baptise M.D.   On: 02/22/2021 14:11   DG Hip Unilat  With Pelvis 2-3 Views Right  Result Date: 02/22/2021 CLINICAL DATA:  Unwitnessed fall.  Right hip pain. EXAM: DG HIP (WITH OR WITHOUT PELVIS) 2-3V RIGHT COMPARISON:  02/15/2016. FINDINGS: Fracture of the right pubis adjacent to the medial right acetabulum, minimally displaced, approximately 4 mm. There is a fracture of the inferior pubic, without significant displacement. A subtle fracture line is suggested of the right ilium just lateral to the inferior right SI joint. No other fractures.  Specifically, no right proximal femur fracture. Hip joints, SI joints and pubic symphysis are normally aligned. Skeletal structures are diffusely demineralized. IMPRESSION: 1. Right pelvic fractures. Fracture at the junction of the right pubis with the medial acetabulum, fracture of the inferior right pubic ramus and a questionable fracture of right ilium just lateral to the inferior right SI joint. 2. No right proximal femur fracture.  No dislocation. Electronically Signed   By: Lajean Manes M.D.   On: 02/22/2021 12:08    Review of Systems  HENT: Negative for ear discharge, ear pain, hearing loss and tinnitus.   Eyes: Negative for photophobia and pain.  Respiratory: Negative for cough and shortness of breath.   Cardiovascular: Negative for chest pain.  Gastrointestinal: Negative for abdominal pain, nausea and vomiting.  Genitourinary: Negative for dysuria, flank pain, frequency and urgency.  Musculoskeletal: Positive for arthralgias (Right leg/hip). Negative for back pain, myalgias and neck pain.  Neurological: Negative for dizziness and headaches.  Hematological: Does not  bruise/bleed easily.  Psychiatric/Behavioral: The patient is not nervous/anxious.    Blood pressure 111/66, pulse 99, temperature 97.6 F  (36.4 C), temperature source Axillary, resp. rate 18, weight 48.4 kg, SpO2 99 %. Physical Exam Constitutional:      General: She is not in acute distress.    Appearance: She is well-developed. She is not diaphoretic.  HENT:     Head: Normocephalic and atraumatic.  Eyes:     General: No scleral icterus.       Right eye: No discharge.        Left eye: No discharge.     Conjunctiva/sclera: Conjunctivae normal.  Cardiovascular:     Rate and Rhythm: Normal rate and regular rhythm.  Pulmonary:     Effort: Pulmonary effort is normal. No respiratory distress.  Musculoskeletal:     Cervical back: Normal range of motion.     Comments: Pelvis--no traumatic wounds or rash, no ecchymosis, stable to manual stress, mod TTP  RLE No traumatic wounds, ecchymosis, or rash  Nontender  No knee or ankle effusion  Knee stable to varus/ valgus and anterior/posterior stress  Sens DPN, SPN, TN intact  Motor EHL, ext, flex, evers 5/5  DP 2+, PT 0, No significant edema  Skin:    General: Skin is warm and dry.  Neurological:     Mental Status: She is alert.  Psychiatric:        Behavior: Behavior normal.     Assessment/Plan: Pelvic fxs -- Pt may be WBAT BLE. F/u with Dr. [name] in 2-3 weeks. Multiple medical problems including atrial fibrillation on Eliquis, CAD, CKD 3A, hypertension, hyperlipidemia, DVT, and chronic diastolic CHF -- per primary service    Kelli Abu, PA-C Orthopedic Surgery [phone] 02/23/2021, 9:11 AM

## 2021-02-23 NOTE — Progress Notes (Signed)
PROGRESS NOTE    Kelli Peterson  WNU:[id number] DOB: [birth year] DOA: 02/22/2021 PCP: [name], DO   Chief Complaint  Patient presents with  . Fall  . Hip Pain  Brief Narrative: 87 year old female with multiple comorbidities with A. fib on Eliquis, CAD, CKD stage III AAA, hypertension hyperlipidemia history of DVT chronic diastolic CHF presented from home with unwitnessed fall in the bathroom and was having right hip pain since then.  She has brought to the ED.  She was found to have right pelvic fracture in the x-ray and the right hip CT work-up showed elevated creatinine. Patient was transferred to [facility] seen by trauma surgery orthopedic surgery. She was hypoxic given Lasix and also having A. fib with RVR and was placed on Cardizem drip.  Subjective: Seen and examined this morning.  Pain is stable when not moving. Alert awake.  On room air. Somewhat poor historian. Heart rate fairly controlled on Cardizem drip.  Assessment & Plan:  Ground-level fall Right pelvic fracture (right inferior pubic ramus, junction of right pubic ramus and acetabulum and iliac wing) Seen by trauma surgery, orthopedic.  Orthopedic has advised nonoperative intervention PT OT WBAT, and follow-up with Dr. [name] in 2 to 3 weeks. Cont  pain control oral and IV opiates.  Started on diet by ortho. May need SNF.  AKI and CKD,stage IIIa: Baseline creatinine since last has been ranging 1.1-1.4.  Creatinine uptrending, minimal nephrotoxic medication, avoid hypotension, hold off on further Lasix.Continue IV fluid hydration.  Monitor lab Recent Labs  Lab 02/22/21 1334 02/23/21 0212  BUN 32* 34*  CREATININE 1.57* 1.84*   CAD/HTN/HLD:Blood pressure stable no chest pain.  Resume her home statin.  Patient on metoprolol Cardizem and Demadex at home currently on hold.  Acute hypoxemic respiratory failure: 89% on room air on admission with chest x-ray with vascular congestion status post dose of Lasix yesterday  last EF 50 to 55%.  Manage A. fib.  Currently on room air this morning.  Atrial fibrillation with rapid ventricular response: On Cardizem drip continue the same-and transition to oral Cardizem and also home metoprolol.  Resume Eliquis since no surgery.  Diet Order            Diet regular Room service appropriate? Yes; Fluid consistency: Thin  Diet effective now               Patient's Body mass index is 20.84 kg/m. DVT prophylaxis: apixaban (ELIQUIS) tablet 2.5 mg Start: 02/23/21 1030 SCDs Start: 02/22/21 1448 SCDs Start: 02/22/21 1448 Code Status:   Code Status: Full Code  Family Communication: plan of care discussed with patient at bedside.  Status is: Inpatient  Remains inpatient appropriate because:IV treatments appropriate due to intensity of illness or inability to take PO and Inpatient level of care appropriate due to severity of illness   Dispo: The patient is from: Home              Anticipated d/c is to: SNF              Patient currently is not medically stable to d/c.   Difficult to place patient No  Unresulted Labs (From admission, onward)          Start     Ordered   02/23/21 0500  CBC  Daily,   R     Question:  Specimen collection method  Answer:  Lab=Lab collect   02/22/21 1447   02/23/21 0347  Basic metabolic panel  Daily,  R     Question:  Specimen collection method  Answer:  Lab=Lab collect   02/22/21 1447          Medications reviewed:  Scheduled Meds: . apixaban  2.5 mg Oral BID  . atorvastatin  20 mg Oral Daily  . brimonidine  1 drop Right Eye BID  . dorzolamide-timolol  1 drop Both Eyes BID  . latanoprost  1 drop Both Eyes Q1200  . multivitamin with minerals  1 tablet Oral Daily   Continuous Infusions: . diltiazem (CARDIZEM) infusion 10 mg/hr (02/23/21 0018)  . lactated ringers Stopped (02/22/21 1517)    Consultants:see note  Procedures:see note  Antimicrobials: Anti-infectives (From admission, onward)   None      Culture/Microbiology    Component Value Date/Time   SDES URINE, RANDOM 04/19/2020 1329   SPECREQUEST NONE 04/19/2020 1329   CULT  04/19/2020 1329    NO GROWTH Performed at [facility] [address], [place], [place] [address]    REPTSTATUS 04/20/2020 FINAL 04/19/2020 1329    Other culture-see note  Objective: Vitals: Today's Vitals   02/22/21 2326 02/23/21 0358 02/23/21 0749 02/23/21 0800  BP: 125/71 (!) 131/97 111/66   Pulse: 79 87 99   Resp:  18 18   Temp: (!) 97.4 F (36.3 C) 97.6 F (36.4 C) 97.6 F (36.4 C)   TempSrc: Oral Oral Axillary   SpO2: 100% 99% 99%   Weight:  48.4 kg    PainSc:    0-No pain    Intake/Output Summary (Last 24 hours) at 02/23/2021 0944 Last data filed at 02/23/2021 0300 Gross per 24 hour  Intake 632.62 ml  Output --  Net 632.62 ml   Filed Weights   02/23/21 0358  Weight: 48.4 kg   Weight change:   Intake/Output from previous day: 05/22 0701 - 05/23 0700 In: 632.6 [I.V.:136.8; IV Piggyback:495.8] Out: -  Intake/Output this shift: No intake/output data recorded. Filed Weights   02/23/21 0358  Weight: 48.4 kg    Examination: General exam: AAO, not in acute on acute distress, on room air, older than stated age, weak appearing. HEENT:Oral mucosa moist, Ear/Nose WNL grossly,dentition normal. Respiratory system: bilaterally clear, no use of accessory muscle, non tender. Cardiovascular system: S1 & S2 +, regular no JVD. Gastrointestinal system: Abdomen soft, NT,ND, BS+. Nervous System:Alert, awake, moving extremities Extremities: no edema, distal peripheral pulses palpable.  Skin: No rashes,no icterus. MSK: Normal muscle bulk,tone, power  Data Reviewed: I have personally reviewed following labs and imaging studies CBC: Recent Labs  Lab 02/22/21 1334 02/22/21 1700 02/22/21 2255 02/23/21 0212  WBC 17.9*  --   --  15.8*  NEUTROABS 16.0*  --   --   --   HGB 14.1 13.1 13.6 13.0  HCT 44.5 42.1 42.5 40.5  MCV 87.9  --    --  86.7  PLT 226  --   --  99991111   Basic Metabolic Panel: Recent Labs  Lab 02/22/21 1334 02/23/21 0212  NA 144 141  K 4.0 4.8  CL 105 107  CO2 27 24  GLUCOSE 151* 144*  BUN 32* 34*  CREATININE 1.57* 1.84*  CALCIUM 9.2 8.5*   GFR: CrCl cannot be calculated (Unknown ideal weight.). Liver Function Tests: No results for input(s): AST, ALT, ALKPHOS, BILITOT, PROT, ALBUMIN in the last 168 hours. No results for input(s): LIPASE, AMYLASE in the last 168 hours. No results for input(s): AMMONIA in the last 168 hours. Coagulation Profile: No results for  input(s): INR, PROTIME in the last 168 hours. Cardiac Enzymes: No results for input(s): CKTOTAL, CKMB, CKMBINDEX, TROPONINI in the last 168 hours. BNP (last 3 results) No results for input(s): PROBNP in the last 8760 hours. HbA1C: No results for input(s): HGBA1C in the last 72 hours. CBG: No results for input(s): GLUCAP in the last 168 hours. Lipid Profile: No results for input(s): CHOL, HDL, LDLCALC, TRIG, CHOLHDL, LDLDIRECT in the last 72 hours. Thyroid Function Tests: No results for input(s): TSH, T4TOTAL, FREET4, T3FREE, THYROIDAB in the last 72 hours. Anemia Panel: No results for input(s): VITAMINB12, FOLATE, FERRITIN, TIBC, IRON, RETICCTPCT in the last 72 hours. Sepsis Labs: No results for input(s): PROCALCITON, LATICACIDVEN in the last 168 hours.  Recent Results (from the past 240 hour(s))  Resp Panel by RT-PCR (Flu A&B, Covid) Nasopharyngeal Swab     Status: None   Collection Time: 02/22/21  2:50 PM   Specimen: Nasopharyngeal Swab; Nasopharyngeal(NP) swabs in vial transport medium  Result Value Ref Range Status   SARS Coronavirus 2 by RT PCR NEGATIVE NEGATIVE Final    Comment: (NOTE) SARS-CoV-2 target nucleic acids are NOT DETECTED.  The SARS-CoV-2 RNA is generally detectable in upper respiratory specimens during the acute phase of infection. The lowest concentration of SARS-CoV-2 viral copies this assay can detect  is 138 copies/mL. A negative result does not preclude SARS-Cov-2 infection and should not be used as the sole basis for treatment or other patient management decisions. A negative result may occur with  improper specimen collection/handling, submission of specimen other than nasopharyngeal swab, presence of viral mutation(s) within the areas targeted by this assay, and inadequate number of viral copies(<138 copies/mL). A negative result must be combined with clinical observations, patient history, and epidemiological information. The expected result is Negative.  Fact Sheet for Patients:  EntrepreneurPulse.com.au  Fact Sheet for Healthcare Providers:  IncredibleEmployment.be  This test is no t yet approved or cleared by the [place] FDA and  has been authorized for detection and/or diagnosis of SARS-CoV-2 by FDA under an Emergency Use Authorization (EUA). This EUA will remain  in effect (meaning this test can be used) for the duration of the COVID-19 declaration under Section 564(b)(1) of the Act, 21 U.S.C.section 360bbb-3(b)(1), unless the authorization is terminated  or revoked sooner.       Influenza A by PCR NEGATIVE NEGATIVE Final   Influenza B by PCR NEGATIVE NEGATIVE Final    Comment: (NOTE) The Xpert Xpress SARS-CoV-2/FLU/RSV plus assay is intended as an aid in the diagnosis of influenza from Nasopharyngeal swab specimens and should not be used as a sole basis for treatment. Nasal washings and aspirates are unacceptable for Xpert Xpress SARS-CoV-2/FLU/RSV testing.  Fact Sheet for Patients: EntrepreneurPulse.com.au  Fact Sheet for Healthcare Providers: IncredibleEmployment.be  This test is not yet approved or cleared by the [place] FDA and has been authorized for detection and/or diagnosis of SARS-CoV-2 by FDA under an Emergency Use Authorization (EUA). This EUA will remain in effect  (meaning this test can be used) for the duration of the COVID-19 declaration under Section 564(b)(1) of the Act, 21 U.S.C. section 360bbb-3(b)(1), unless the authorization is terminated or revoked.  Performed at [facility], [place] [address], [place], [place] [address]      Radiology Studies: CT Head Wo Contrast  Result Date: 02/22/2021 CLINICAL DATA:  Fall, head trauma EXAM: CT HEAD WITHOUT CONTRAST TECHNIQUE: Contiguous axial images were obtained from the base of the skull through the vertex without intravenous  contrast. COMPARISON:  02/07/2018 FINDINGS: Brain: No evidence of acute infarction, hemorrhage, hydrocephalus, extra-axial collection or mass lesion/mass effect. Advanced low-density changes within the periventricular and subcortical white matter compatible with chronic microvascular ischemic change. Mild diffuse cerebral volume loss. Vascular: Atherosclerotic calcifications involving the large vessels of the skull base. No unexpected hyperdense vessel. Skull: Normal. Negative for fracture or focal lesion. Sinuses/Orbits: No acute finding. Other: Negative for scalp hematoma. IMPRESSION: 1. No acute intracranial findings. 2. Chronic microvascular ischemic change and cerebral volume loss. Electronically Signed   By: Davina Poke D.O.   On: 02/22/2021 14:16   CT PELVIS WO CONTRAST  Result Date: 02/22/2021 CLINICAL DATA:  Unwitnessed fall in a bathroom today. Right hip pain. EXAM: CT PELVIS WITHOUT CONTRAST TECHNIQUE: Multidetector CT imaging of the pelvis was performed following the standard protocol without intravenous contrast. COMPARISON:  Current right hip radiographs. FINDINGS: Musculoskeletal: Right pelvic fractures. There is a nondisplaced, coronal oblique fracture extending across the right ilium from the inferior margin of the SI joint 2 the level of the posterosuperior right acetabulum. Another fracture crosses the pubis at its junction with the medial  acetabulum, displaced by 5 mm. A third fracture is noted of the inferior pubic ramus at the level of the abductor muscle origins. No other fractures. No bone lesions. Hip joints, SI joints and pubic symphysis are normally aligned. There is soft tissue hemorrhage at lies medial to the right pubis and anterior medial right acetabulum, mildly indenting the bladder. Urinary Tract: No bladder mass, wall thickening or stone. Visualized ureters normal in course and in caliber. Bowel: Rectum moderately distended with stool. Multiple sigmoid colon diverticula. No bowel wall thickening or inflammation. Vascular/Lymphatic: Aortoiliac atherosclerotic calcifications stable from the previous CT. No enlarged lymph nodes. Reproductive:  Status post hysterectomy.  No pelvic masses. Other:  None. IMPRESSION: 1. Fractures of the right hemipelvis as detailed above, involving the right ileum, junction of the right pubis and anteromedial acetabulum and of the inferior right pubic ramus. Associated extraperitoneal hemorrhage along the right anterior inferior pelvis. 2. No fracture of the right proximal femur. No dislocation. No bone lesions. Electronically Signed   By: Lajean Manes M.D.   On: 02/22/2021 12:27   DG Chest Port 1 View  Result Date: 02/22/2021 CLINICAL DATA:  Fall EXAM: PORTABLE CHEST 1 VIEW COMPARISON:  04/23/2020 FINDINGS: Cardiomegaly, vascular congestion. No overt edema, confluent opacities or effusions. Aortic atherosclerosis. No acute bony abnormality. IMPRESSION: Cardiomegaly, vascular congestion. Aortic atherosclerosis. Electronically Signed   By: Rolm Baptise M.D.   On: 02/22/2021 14:11   DG Hip Unilat  With Pelvis 2-3 Views Right  Result Date: 02/22/2021 CLINICAL DATA:  Unwitnessed fall.  Right hip pain. EXAM: DG HIP (WITH OR WITHOUT PELVIS) 2-3V RIGHT COMPARISON:  02/15/2016. FINDINGS: Fracture of the right pubis adjacent to the medial right acetabulum, minimally displaced, approximately 4 mm. There is a  fracture of the inferior pubic, without significant displacement. A subtle fracture line is suggested of the right ilium just lateral to the inferior right SI joint. No other fractures.  Specifically, no right proximal femur fracture. Hip joints, SI joints and pubic symphysis are normally aligned. Skeletal structures are diffusely demineralized. IMPRESSION: 1. Right pelvic fractures. Fracture at the junction of the right pubis with the medial acetabulum, fracture of the inferior right pubic ramus and a questionable fracture of right ilium just lateral to the inferior right SI joint. 2. No right proximal femur fracture.  No dislocation. Electronically Signed   By: Shanon Brow  Ormond M.D.   On: 02/22/2021 12:08     LOS: 1 day   Antonieta Pert, MD [facility]  02/23/2021, 9:44 AM

## 2021-02-23 NOTE — Discharge Instructions (Signed)
Nutrition Post Hospital Stay Proper nutrition can help your body recover from illness and injury.   Foods and beverages high in protein, vitamins, and minerals help rebuild muscle loss, promote healing, & reduce fall risk.   In addition to eating healthy foods, a nutrition shake is an easy, delicious way to get the nutrition you need during and after your hospital stay  It is recommended that you continue to drink 2-3 bottles per day of: Ensure for at least 1 month (30 days) after your hospital stay   Tips for adding a nutrition shake into your routine: As allowed, drink one with vitamins or medications instead of water or juice Enjoy one as a tasty mid-morning or afternoon snack Drink cold or make a milkshake out of it Drink one instead of milk with cereal or snacks Use as a coffee creamer   Available at the following grocery stores and pharmacies:           * [employer] * [employer] * [employer]  * R[employer] Aid          * Walmart * [employer]  * Walgreens      * Target  * [employer]J's   * [facility]  * [employer]   * [facility] [phone]            For COUPONS visit: [email or web address]/join or [email or web address]   Suggested Substitutions Ensure Plus = Boost Plus = Carnation Breakfast Essentials = Boost Compact Ensure Active Clear = Boost Breeze Glucerna Shake = Boost Glucose Control = Carnation Breakfast Essentials SUGAR FREE  Suggestions For Increasing Calories And Protein Several small meals a day are easier to eat and digest than three large ones. Space meals about 2 to 3 hours apart to maximize comfort. Stop eating 2 to 3 hours before bed and sleep with your head elevated if gastric reflux (GERD) and heartburn are problems. Do not eat your favorite foods if you are feeling bad. Save them for when you feel good! Eat breakfast-type foods at any meal. Eggs are usually easy to eat and are great any time of the day. (The same goes for pancakes and waffles.) Eat when you  feel hungry. Most people have the greatest appetite in the morning because they have not eaten all night. If this is the best meal for you, then pile on those calories and other nutrients in the morning and at lunch. Then you can have a smaller dinner without losing total calories for the day. Eat leftovers or nutritious snacks in the afternoon and early evening to round out your day. Try homemade or commercially prepared nutrition bars and puddings, as well as calorie- and protein-rich liquid nutritional supplements. Benefits of Physical Activity Talk to your doctor about physical activity. Light or moderate physical activity can help maintain muscle and promote an appetite. Walking in the neighborhood or the local mall is a great way to get up, get out, and get moving. If you are unsteady on your feet, try walking around the dining room table. Save Room for Calorie-Rich Food! Drink most fluids between meals instead of with meals. (It is fine to have a sip to help swallow food at meal time.) Fluids (which usually have fewer calories and nutrients than solid food) can take up valuable space in your stomach.  Foods Recommended High-Protein Foods Milk products Add cheese to toast, crackers, sandwiches, baked potatoes, vegetables, soups, noodles, meat, and fruit. Use reduced-fat (2%) or whole milk in place of water   when cooking cereal and cream soups. Include cream sauces on vegetables and pasta. Add powdered milk to cream soups and mashed potatoes.  Eggs Have hard-cooked eggs readily available in the refrigerator. Chop and add to salads, casseroles, soups, and vegetables. Make a quick egg salad. All eggs should be well cooked to avoid the risk of harmful bacteria.  Meats, poultry, and fish Add leftover cooked meats to soups, casseroles, salads, and omelets. Make dip by mixing diced, chopped, or shredded meat with sour cream and spices.  Beans, legumes, nuts, and seeds Sprinkle nuts and seeds on cereals,  fruit, and desserts such as ice cream, pudding, and custard. Also serve nuts and seeds on vegetables, salads, and pasta. Spread peanut butter on toast, bread, English muffins, and fruit, or blend it in a milk shake. Add beans and peas to salads, soups, casseroles, and vegetable dishes.  High-Calorie Foods Butter, margarine, and  oils Melt butter or margarine over potatoes, rice, pasta, and cooked vegetables. Add melted butter or margarine into soups and casseroles and spread on bread for sandwiches before spreading sandwich spread or peanut butter. Saut or stir-fry vegetables, meats, chicken and fish such as shrimp/scallops in olive or canola oil. A variety of oils add calories and can be used to marinate meat, chicken, or fish.  Milk products Add whipping cream to desserts, pancakes, waffles, fruit, and hot chocolate, and fold it into soups and casseroles. Add sour cream to baked potatoes and vegetables.  Salad dressing Use regular (not low-fat or diet) mayonnaise and salad dressing on sandwiches and in dips with vegetables and fruit.   Sweets Add jelly and honey to bread and crackers. Add jam to fruit and ice cream and as a topping over cake.   Copyright 2020  Academy of [employer] and Dietetics. All rights reserved.  

## 2021-02-23 NOTE — TOC CAGE-AID Note (Signed)
Transition of Care [facility]) - CAGE-AID Screening   Patient Details  Name: Kelli Peterson MRN: [id number] Date of Birth: Feb 28, 1930   Elvina Sidle, RN  Trauma Response Nurse Phone Number: [phone] 02/23/2021, 10:54 AM    CAGE-AID Screening:    Have You Ever Felt You Ought to Cut Down on Your Drinking or Drug Use?: No Have People Annoyed You By Critizing Your Drinking Or Drug Use?: No Have You Felt Bad Or Guilty About Your Drinking Or Drug Use?: No Have You Ever Had a Drink or Used Drugs First Thing In The Morning to Steady Your Nerves or to Get Rid of a Hangover?: No CAGE-AID Score: 0  Substance Abuse Education Offered: No (denies alcohol or drug use)

## 2021-02-23 NOTE — Progress Notes (Signed)
Pt remains on cardizem gtt bumping to 10 mg and on 3 l with spo2 95. Sleepy and tired and did not eat well. Repeat cxr and bmp and consider ivf for gentle hydration overnight. Discussed an with daighter and RN.

## 2021-02-23 NOTE — TOC Initial Note (Signed)
Transition of Care [facility]) - Initial/Assessment Note    Patient Details  Name: Kelli Peterson MRN: [id number] Date of Birth: 1930/04/17  Transition of Care [facility]) CM/SW Contact:    Bethann Berkshire, [place] Phone Number: 02/23/2021, 1:38 PM  Clinical Narrative:                  CSW met with pt and pt daughter Arbie Cookey bedside to discuss SNF recommendation. Pt lives at home with her other daughter [name] in [place]. Arbie Cookey lives in [place]. Pt has been to SNF in the past at [facility]. Pt and Arbie Cookey are agreeable to SNF workup. Pt is covid vaccinated x2 but no booster. CSW completed FL2 and faxed bed requests in hub.   Expected Discharge Plan: Skilled Nursing Facility Barriers to Discharge: Continued Medical Work up   Patient Goals and CMS Choice     Choice offered to / list presented to : [facility]  Expected Discharge Plan and Services Expected Discharge Plan: [place] arrangements for the past 2 months: Single Family Home                                      Prior Living Arrangements/Services Living arrangements for the past 2 months: Single Family Home Lives with:: Adult Children          Need for Family Participation in Patient Care: Yes (Comment) Care giver support system in place?: Yes (comment)   Criminal Activity/Legal Involvement Pertinent to Current Situation/Hospitalization: No - Comment as needed  Activities of Daily Living Home Assistive Devices/Equipment: Walker (specify type) ADL Screening (condition at time of admission) Patient's cognitive ability adequate to safely complete daily activities?: Yes Is the patient deaf or have difficulty hearing?: Yes Does the patient have difficulty seeing, even when wearing glasses/contacts?: Yes Does the patient have difficulty concentrating, remembering, or making decisions?: Yes Patient able to express need for assistance with ADLs?: Yes Does the patient have  difficulty dressing or bathing?: Yes Independently performs ADLs?: Yes (appropriate for developmental age) Does the patient have difficulty walking or climbing stairs?: Yes Weakness of Legs: None Weakness of Arms/Hands: None  Permission Sought/Granted   Permission granted to share information with : Yes, Verbal Permission Granted  Share Information with NAME: Debbe Odea (Daughter)   [phone] ([place]), and daughter Arbie Cookey           Emotional Assessment Appearance:: Appears stated age Attitude/Demeanor/Rapport: Lethargic Affect (typically observed): Calm Orientation: : Oriented to Self,Oriented to Place,Oriented to Situation Alcohol / Substance Use: Not Applicable Psych Involvement: No (comment)  Admission diagnosis:  Fall [W19.XXXA] Anticoagulated [Z79.01] Atrial fibrillation with rapid ventricular response ([place]) [I48.91] Pelvic hematoma in female [N94.89] Closed displaced fracture of pelvis, unspecified part of pelvis, initial encounter ([place]) [S32.9XXA] Patient Active Problem List   Diagnosis Date Noted  . Fall 02/22/2021  . Hip fracture, unspecified laterality, closed, initial encounter ([place]) 02/22/2021  . SOB (shortness of breath)   . Acute on chronic diastolic heart failure ([place])   . Pressure injury of skin 04/17/2020  . Atrial fibrillation with rapid ventricular response ([place])   . Acute on chronic diastolic (congestive) heart failure ([place])   . Pulmonary hypertension ([place])   . Acute kidney injury superimposed on chronic kidney disease ([place])   . CKD (chronic kidney disease), stage III ([place]) 04/15/2020  . Acute hypoxemic respiratory failure (  [place]) 04/15/2020  . Branch retinal vein occlusion with macular edema of left eye 01/23/2020  . Branch retinal vein occlusion of right eye 01/23/2020  . Exudative age-related macular degeneration of right eye with inactive choroidal neovascularization ([place]) 01/23/2020  . Intermediate stage nonexudative age-related macular  degeneration of left eye 01/23/2020  . Protein-calorie malnutrition, severe 06/02/2018  . AKI (acute kidney injury) ([place]) 05/31/2018  . Dehydration 05/31/2018  . Essential hypertension 05/31/2018  . Atrial fibrillation, chronic ([place]) 05/31/2018  . COPD (chronic obstructive pulmonary disease) ([place]) 05/31/2018  . Glaucoma 05/31/2018  . Hyperlipidemia 05/31/2018  . Acute renal failure (ARF) ([place]) 05/31/2018  . Constipation 05/31/2018  . Acute right hip pain   . Atrial fibrillation with RVR ([place])   . Pelvic fracture, closed, initial encounter 02/15/2016  . Fracture of pubic ramus ([place]) 02/15/2016   PCP:  [name], DO Pharmacy:   [id number] [place] [place] [address]Phone: [phone]: [phone]    Social Determinants of Health (SDOH) Interventions    Readmission Risk Interventions No flowsheet data found.

## 2021-02-23 NOTE — Progress Notes (Signed)
Pt  Now SPO2 [place] 4L sitting at 93. Pt was on room air this AM. Pt coughing while eating family member states" she knows,  she does this at home".  Suspect possible aspiration.  MD notified will continue to monitor. Speech evaluation placed.

## 2021-02-24 ENCOUNTER — Inpatient Hospital Stay (HOSPITAL_COMMUNITY): Payer: Medicare Other

## 2021-02-24 ENCOUNTER — Encounter (HOSPITAL_COMMUNITY): Payer: Self-pay | Admitting: Internal Medicine

## 2021-02-24 ENCOUNTER — Other Ambulatory Visit: Payer: Self-pay

## 2021-02-24 DIAGNOSIS — S72009A Fracture of unspecified part of neck of unspecified femur, initial encounter for closed fracture: Secondary | ICD-10-CM | POA: Diagnosis not present

## 2021-02-24 DIAGNOSIS — R9431 Abnormal electrocardiogram [ECG] [EKG]: Secondary | ICD-10-CM

## 2021-02-24 DIAGNOSIS — I340 Nonrheumatic mitral (valve) insufficiency: Secondary | ICD-10-CM

## 2021-02-24 DIAGNOSIS — J9601 Acute respiratory failure with hypoxia: Secondary | ICD-10-CM

## 2021-02-24 DIAGNOSIS — I4891 Unspecified atrial fibrillation: Secondary | ICD-10-CM

## 2021-02-24 DIAGNOSIS — I361 Nonrheumatic tricuspid (valve) insufficiency: Secondary | ICD-10-CM | POA: Diagnosis not present

## 2021-02-24 LAB — URINALYSIS, ROUTINE W REFLEX MICROSCOPIC
Bilirubin Urine: NEGATIVE
Glucose, UA: NEGATIVE mg/dL
Hgb urine dipstick: NEGATIVE
Ketones, ur: NEGATIVE mg/dL
Nitrite: NEGATIVE
Protein, ur: NEGATIVE mg/dL
Specific Gravity, Urine: 1.014 (ref 1.005–1.030)
WBC, UA: >50 WBC/hpf — ABNORMAL HIGH (ref 0–5)
pH: 6 (ref 5.0–8.0)

## 2021-02-24 LAB — ECHOCARDIOGRAM COMPLETE
AR max vel: 1.15 cm2
AV Area VTI: 1.09 cm2
AV Area mean vel: 1.08 cm2
AV Mean grad: 3.5 mmHg
AV Peak grad: 6.8 mmHg
Ao pk vel: 1.3 m/s
Area-P 1/2: 5.42 cm2
Calc EF: 59.7 %
Height: 60 in
S' Lateral: 2.9 cm
Single Plane A2C EF: 63.1 %
Single Plane A4C EF: 57.6 %
Weight: 1788.37 oz

## 2021-02-24 LAB — BASIC METABOLIC PANEL
Anion gap: 8 (ref 5–15)
BUN: 47 mg/dL — ABNORMAL HIGH (ref 8–23)
CO2: 25 mmol/L (ref 22–32)
Calcium: 8 mg/dL — ABNORMAL LOW (ref 8.9–10.3)
Chloride: 103 mmol/L (ref 98–111)
Creatinine, Ser: 2.03 mg/dL — ABNORMAL HIGH (ref 0.44–1.00)
GFR, Estimated: 23 mL/min — ABNORMAL LOW (ref >60)
Glucose, Bld: 130 mg/dL — ABNORMAL HIGH (ref 70–99)
Potassium: 3.9 mmol/L (ref 3.5–5.1)
Sodium: 136 mmol/L (ref 135–145)

## 2021-02-24 LAB — PROCALCITONIN: Procalcitonin: 0.78 ng/mL

## 2021-02-24 LAB — CBC
HCT: 32.7 % — ABNORMAL LOW (ref 36.0–46.0)
Hemoglobin: 10.7 g/dL — ABNORMAL LOW (ref 12.0–15.0)
MCH: 28.4 pg (ref 26.0–34.0)
MCHC: 32.7 g/dL (ref 30.0–36.0)
MCV: 86.7 fL (ref 80.0–100.0)
Platelets: 144 10*3/uL — ABNORMAL LOW (ref 150–400)
RBC: 3.77 MIL/uL — ABNORMAL LOW (ref 3.87–5.11)
RDW: 17.2 % — ABNORMAL HIGH (ref 11.5–15.5)
WBC: 21.2 10*3/uL — ABNORMAL HIGH (ref 4.0–10.5)
nRBC: 0 % (ref 0.0–0.2)

## 2021-02-24 MED ORDER — ENOXAPARIN SODIUM 60 MG/0.6ML IJ SOSY
50.0000 mg | PREFILLED_SYRINGE | INTRAMUSCULAR | Status: DC
  Administered 2021-02-24: 50 mg via SUBCUTANEOUS
  Filled 2021-02-24 (×2): qty 0.6

## 2021-02-24 MED ORDER — ORAL CARE MOUTH RINSE
15.0000 mL | Freq: Two times a day (BID) | OROMUCOSAL | Status: DC
  Administered 2021-02-24 – 2021-03-03 (×14): 15 mL via OROMUCOSAL

## 2021-02-24 MED ORDER — SODIUM CHLORIDE 0.9 % IV SOLN
3.0000 g | Freq: Two times a day (BID) | INTRAVENOUS | Status: AC
  Administered 2021-02-24 – 2021-02-28 (×10): 3 g via INTRAVENOUS
  Filled 2021-02-24: qty 8
  Filled 2021-02-24 (×6): qty 3
  Filled 2021-02-24 (×2): qty 8
  Filled 2021-02-24 (×3): qty 3

## 2021-02-24 MED ORDER — DILTIAZEM HCL 60 MG PO TABS
60.0000 mg | ORAL_TABLET | Freq: Four times a day (QID) | ORAL | Status: DC
  Administered 2021-02-24 – 2021-02-25 (×4): 60 mg via ORAL
  Filled 2021-02-24 (×4): qty 1

## 2021-02-24 NOTE — Progress Notes (Signed)
MD ordered Urinalysis clean catch on pt. Pt was bladder scanned . Scan read 77-83 ML in bladder at this time. MD aware. New per-wick and tubing placed will continue to monitor  Phoebe Sharps, RN

## 2021-02-24 NOTE — TOC Progression Note (Addendum)
Transition of Care [facility]) - Progression Note    Patient Details  Name: Kelli Peterson MRN: [id number] Date of Birth: 12-24-1929  Transition of Care [facility]) CM/SW Candelaria Arenas, [place] Phone Number: 02/24/2021, 12:07 PM  Clinical Narrative:     CSW checked with [facility]; they do not have any beds CSW checked with [facility]; CSW is informed that they cannot offer at this time because pt is not participating well in therapy. They are willing to reevaluate pt closer to d/c to see how she progresses.   CSW met with pt daughter [name] bedside; pt asleep. CSW updated daughter on [facility] and [facility]. CSW provided SNF bed offers. Will follow up later this afternoon for SNF choice.   1215: Daughter [name] called CSW. Explained she really would prefer [facility] but if [facility] cannot offer closer to d/c then second choice would be [facility].     Expected Discharge Plan: [place] Barriers to Discharge: Continued Medical Work up  Expected Discharge Plan and Services Expected Discharge Plan: [place] arrangements for the past 2 months: Single Family Home                                       Social Determinants of Health (SDOH) Interventions    Readmission Risk Interventions No flowsheet data found.

## 2021-02-24 NOTE — Evaluation (Addendum)
Occupational Therapy Evaluation Patient Details Name: Kelli Peterson MRN: [id number] DOB: Aug 27, 1930 Today's Date: 02/24/2021    History of Present Illness Patient is a 87 y/o female who presents with right pelvic fxs (right inferior pubic ramus, junction of right pubic ramus and acetabulum and iliac wing) s/p fall, also noted to have A-fib with RVR and elevated creatinine. CXR-vascular congestion. PMH includes HTN, CHF, A-fib, cervical ca, CAD, CKD stage III, DVT, AAA.   Clinical Impression   PTA patient reports completing ADLs with independence, using RW for mobility. Pt admitted for above and presenting with problem list below, including pain, impaired cognition, decreased activity tolerance and weakness. Limited session due to pain (and RN unable to provided pain meds due to NPO status at this time), attempted EOB but pt guarding and yelling out in pain; repositioned in bed with +2 total assist and placed bed into chair position at 45 degrees.  Patient able to engage in ADLs seated with setup for grooming, up to mod assist for UB and total assist for LB.  Pt disoriented to time, follows simple commands but decreased awareness and problem solving. Based on performance today, believe she will benefit from continued OT services while admitted and after dc at SNF level to decrease burden of care with ADLs, mobility.     Follow Up Recommendations  SNF;Supervision/Assistance - 24 hour    Equipment Recommendations  Other (comment) (TBd at next venue of care)    Recommendations for Other Services       Precautions / Restrictions Precautions Precautions: Fall;Other (comment) Precaution Comments: watch 02 Restrictions Weight Bearing Restrictions: Yes RLE Weight Bearing: Weight bearing as tolerated Other Position/Activity Restrictions: WBAT BLEs      Mobility Bed Mobility Overal bed mobility: Needs Assistance             General bed mobility comments: attempted EOB but unable to  assist, resisting movement and screaming out due to increased pain, repositioned in chair with HOB to 45 degrees    Transfers                 General transfer comment: unable    Balance                                           ADL either performed or assessed with clinical judgement   ADL Overall ADL's : Needs assistance/impaired     Grooming: Set up;Sitting (chair position in bed)   Upper Body Bathing: Minimal assistance;Sitting   Lower Body Bathing: Total assistance;Bed level   Upper Body Dressing : Moderate assistance;Bed level   Lower Body Dressing: Total assistance;Bed level     Toilet Transfer Details (indicate cue type and reason): deferred due to pain         Functional mobility during ADLs: Maximal assistance (bed level only) General ADL Comments: pt limited by pain, weakness, decreased activity tolerance     Vision   Vision Assessment?: No apparent visual deficits     Perception     Praxis      Pertinent Vitals/Pain Pain Assessment: Faces (Simultaneous filing. User may not have seen previous data.) Faces Pain Scale: No hurt (Simultaneous filing. User may not have seen previous data.) Pain Location: right pelvis worsened with movement Pain Descriptors / Indicators: Grimacing;Guarding;Discomfort;Moaning Pain Intervention(s): Limited activity within patient's tolerance;Monitored during session;Repositioned     Hand Dominance Right  Extremity/Trunk Assessment Upper Extremity Assessment Upper Extremity Assessment: Generalized weakness   Lower Extremity Assessment Lower Extremity Assessment: Defer to PT evaluation   Cervical / Trunk Assessment Cervical / Trunk Assessment: Kyphotic   Communication Communication Communication: HOH   Cognition Arousal/Alertness: Awake/alert Behavior During Therapy: WFL for tasks assessed/performed Overall Cognitive Status: Impaired/Different from baseline Area of Impairment:  Orientation;Following commands;Problem solving;Awareness                 Orientation Level: Disoriented to;Time     Following Commands: Follows one step commands consistently;Follows one step commands with increased time;Follows multi-step commands inconsistently   Awareness: Emergent Problem Solving: Slow processing;Decreased initiation;Difficulty sequencing;Requires verbal cues General Comments: disoriented to year, follows simple commands with increased time, decreased problem solving; internally distracted by pain   General Comments  daughter present and supportive, pt on supplemental O2 with SpO2 maintained today; limited session due to pain but pt NPO due to do difficulty swallowing therefore RN unable to provide medication    Exercises     Shoulder Instructions      Home Living Family/patient expects to be discharged to:: Skilled nursing facility Living Arrangements: Children                                      Prior Functioning/Environment Level of Independence: Independent with assistive device(s)        Comments: Uses RW for ambulation, independent for ADLs (sponge bathing only)        OT Problem List: Decreased strength;Decreased activity tolerance;Impaired balance (sitting and/or standing);Decreased cognition;Decreased safety awareness;Decreased knowledge of use of DME or AE;Decreased knowledge of precautions;Pain      OT Treatment/Interventions: Self-care/ADL training;DME and/or AE instruction;Therapeutic activities;Cognitive remediation/compensation;Patient/family education;Balance training;Therapeutic exercise    OT Goals(Current goals can be found in the care plan section) Acute Rehab OT Goals Patient Stated Goal: improve pain OT Goal Formulation: With patient Time For Goal Achievement: 03/10/21 Potential to Achieve Goals: Good  OT Frequency: Min 2X/week   Barriers to D/C:            Co-evaluation              AM-PAC OT  "6 Clicks" Daily Activity     Outcome Measure Help from another person eating meals?: Total Help from another person taking care of personal grooming?: A Little Help from another person toileting, which includes using toliet, bedpan, or urinal?: Total Help from another person bathing (including washing, rinsing, drying)?: A Lot Help from another person to put on and taking off regular upper body clothing?: A Lot Help from another person to put on and taking off regular lower body clothing?: Total 6 Click Score: 10   End of Session Equipment Utilized During Treatment: Oxygen Nurse Communication: Mobility status;Other (comment) (pain)  Activity Tolerance: Patient limited by pain Patient left: in bed;with call bell/phone within reach;with bed alarm set;with family/visitor present;with SCD's reapplied  OT Visit Diagnosis: Other abnormalities of gait and mobility (R26.89);Muscle weakness (generalized) (M62.81);Pain;Other symptoms and signs involving cognitive function;History of falling (Z91.81) Pain - Right/Left: Right Pain - part of body: Hip;Leg;Ankle and joints of foot                Time: 0912-0939 OT Time Calculation (min): 27 min Charges:  OT General Charges $OT Visit: 1 Visit OT Evaluation $OT Eval Moderate Complexity: 1 Mod OT Treatments $Self Care/Home Management : 8-22 mins  Jolaine Artist,  OT Acute Rehabilitation Services Pager [phone] Office [phone]   Delight Stare 02/24/2021, 12:08 PM

## 2021-02-24 NOTE — Progress Notes (Signed)
ANTICOAGULATION CONSULT NOTE - Follow Up Consult  Pharmacy Consult for Eliquis to Lovenox Indication: atrial fibrillation  Allergies  Allergen Reactions  . Penicillins Swelling and Other (See Comments)    Ankles and feet swell  . Zocor [Simvastatin] Other (See Comments)    Weak and dizzy  . Nsaids Other (See Comments)    Tylenol is all that is permitted    Patient Measurements: Height: 5' (152.4 cm) Weight: 50.7 kg (111 lb 12.4 oz) IBW/kg (Calculated) : 45.5  Vital Signs: Temp: 98.3 F (36.8 C) (05/24 0753) Temp Source: Oral (05/24 0753) BP: 105/68 (05/24 0753) Pulse Rate: 99 (05/24 0753)  Labs: Recent Labs    02/22/21 1334 02/22/21 1700 02/22/21 2255 02/23/21 0212 02/23/21 1850 02/24/21 0417  HGB 14.1   < > 13.6 13.0  --  10.7*  HCT 44.5   < > 42.5 40.5  --  32.7*  PLT 226  --   --  189  --  144*  CREATININE 1.57*  --   --  1.84* 2.04* 2.03*   < > = values in this interval not displayed.    Estimated Creatinine Clearance: 13.8 mL/min (A) (by C-G formula based on SCr of 2.03 mg/dL (H)).  Assessment: 87 year old female admitted s/p fall, found to have pelvic fracture, now NPO with possible pneumonia.  Adding Unasyn and transitioning home Eliquis for afib to Lovenox  Goal of Therapy:  Appropriate dosing Monitor platelets by anticoagulation protocol: Yes   Plan:  Lovenox 50 mg sq Q 24 hours to begin this AM -> treatment dose for her Unasyn 3 grams iv Q 12 hours -- has had Unasyn in the past Follow up progress  Thank you Anette Guarneri, PharmD  02/24/2021,9:28 AM

## 2021-02-24 NOTE — Progress Notes (Signed)
Patient remains lethargic. Unsafe to give patient anything by mouth. Patient made NPO until SLP can be completed. Will continue to monitor

## 2021-02-24 NOTE — Consult Note (Addendum)
Cardiology Consultation:   Patient ID: Kelli Peterson MRN: [id number]; DOB: [birth year]  Admit date: 02/22/2021 Date of Consult: 02/24/2021  PCP:  [name], [place] [facility] Cardiologist:  Fransico Him, MD        Patient Profile:   Kelli Peterson is a 87 y.o. 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 Lupita Leash.  History of Present Illness:   [name] (pronounce peh-der-son) has a history of atrial fib, historically followed as outpatient by primary care. 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 Hi-res CT chest showed mosaic attenuation favoring due to pulm vascular disease, throughout both lungs, most prominent in the lower lobes, favoring combination of pulmonary edema, nonspecific postinfectious/ postinflammatory scarring and atelectasis, cannot exclude basilar ILD, as well as chronic bronchitis and/or reactive airway disease. There was concern for aspiration. HRs at DC were 80s-110s. 2D echo 04/16/20 showed EF 50-55%, mildly reduced RVEF, moderately elevated PASP, severe BAE, mild MR, mod-severe TR. Do not see she saw [place] in follow-up. Daughter states she went home on O2 but did not want to wear it at home so they weaned it off with doctor approval. The patient lives with her daughter now.  Ms. Bealer was admitted 02/22/2021 with fall in the bathroom after losing her balance. She was found to have several fractures within the pelvis with pelvic hematoma. She was found  to have POx 89% on RA as well as AKI with Cr 1.57. CXR with pulmonary vascular congestion. EKG showed atrial fib RVR with rates 130s-150s. Eliquis was held. She was treated with 20mg  IV Lasix and started on diltiazem drip. She was seen by ortho and trauma who recommended nonoperative intervention and WBAT. Per notes she was originally able to be titrated off O2 to RA but then developed recurrent oxygen requirement yesterday. Nursing notes indicate some coughing while eating/aspiration risk. She was made NPO for concern for aspiration with SLP eval ordered. Eliquis was restarted yesterday then transitioned to treatment dose Lonovex. Labs show continued AKI with BUN/Cr 47/2.03, BNP 220, procalcitonin elevated at 0.78, rising WBC to 21k, Hgb decline from 13->10.7. UA, BCx pending. 1v CXR yesterday showed enlargement of cardiac silhouette with mild pulmonary edema. She was also started on LR IVF for lethargy and started on abx. Her daughter states she is more alert this morning and just got done working with OT and did well. SLP note indicates they declined MBS and did not want any diet modifications so cannot r/o silent aspiration. She has been given the OK for whole meds with puree. HR hovering 95-105 this AM. At home she is on diltiazem 240mg  and Toprol 75mg . She remains on diltiazem drip at this time at 10mg /hr. She denies any acute symptoms this AM, states she is feeling pretty good. Bladder scan underway. 2D echo pending.   Past Medical History:  Diagnosis Date  . Adenomatous colon polyp 2007  . Barrett's esophagus 2007  . Cerebellar hemorrhage ([place])   . Cervical cancer ([place])   . Chronic diastolic CHF (congestive  heart failure) ([place])   . CKD (chronic kidney disease), stage II   . Closed pelvic fracture ([place])   . DVT (deep venous thrombosis) ([place])   . Fibrocystic breast disease   . Fracture of pubic ramus ([place])   . Hyperlipidemia   . Hypertension   . Iron deficiency anemia   . Macular degeneration    . Permanent atrial fibrillation ([place])   . Pulmonary hypertension ([place])   . Tricuspid regurgitation     Past Surgical History:  Procedure Laterality Date  . CATARACT EXTRACTION Right 02/15/2010   [facility]  . CATARACT EXTRACTION Left 01/30/2010   [facility]  . CATARACT EXTRACTION, BILATERAL  2010  . TOTAL ABDOMINAL HYSTERECTOMY  1964     Home Medications:  Prior to Admission medications   Medication Sig Start Date End Date Taking? Authorizing Provider  acetaminophen (TYLENOL) 500 MG tablet Take 500 mg by mouth every 6 (six) hours as needed for pain.   Yes [provider]  apixaban (ELIQUIS) 2.5 MG TABS tablet Take 2.5 mg by mouth 2 (two) times daily.   Yes [provider]  atorvastatin (LIPITOR) 20 MG tablet Take 20 mg by mouth daily.   Yes [provider]  brimonidine (ALPHAGAN) 0.2 % ophthalmic solution Place 1 drop into the right eye 2 (two) times daily. 05/01/18  Yes [provider]  denosumab (PROLIA) 60 MG/ML SOSY injection Inject 60 mg into the skin every 6 (six) months.   Yes [provider]  diltiazem (CARDIZEM CD) 240 MG 24 hr capsule Take 1 capsule (240 mg total) by mouth daily. 04/25/20 05/25/20 Yes Kyle, Tyrone A, DO  dorzolamide-timolol (COSOPT) 22.3-6.8 MG/ML ophthalmic solution Place 1 drop into both eyes 2 (two) times daily. 02/12/21  Yes [provider]  latanoprost (XALATAN) 0.005 % ophthalmic solution Place 1 drop into both eyes every evening.   Yes [provider]  metoprolol succinate (TOPROL-XL) 25 MG 24 hr tablet Take 3 tablets (75 mg total) by mouth daily. 04/25/20 05/25/20 Yes Kyle, Tyrone A, DO  Multiple Vitamin (MULTIVITAMIN WITH MINERALS) TABS tablet Take 1 tablet by mouth daily.   Yes [provider]  torsemide (DEMADEX) 20 MG tablet Take 1 tablet (20 mg total) by mouth 2 (two) times daily. Patient taking differently: Take 20-40 mg by mouth See admin instructions. Take 1 tablet by mouth all days  EXCEPT on Mon, Wed, and Fri take 1 additional tablet to equal 40mg  04/24/20 05/24/20 Yes Kyle, Tyrone A, DO  potassium chloride (KLOR-CON) 10 MEQ tablet Take 1 tablet (10 mEq total) by mouth daily. Patient not taking: Reported on 02/22/2021 04/24/20 05/24/20  Jonnie Finner, DO    Inpatient Medications: Scheduled Meds: . brimonidine  1 drop Right Eye BID  . dorzolamide-timolol  1 drop Both Eyes BID  . enoxaparin (LOVENOX) injection  50 mg Subcutaneous Q24H  . feeding supplement  237 mL Oral Q24H  . latanoprost  1 drop Both Eyes Q1200   Continuous Infusions: . ampicillin-sulbactam (UNASYN) IV 3 g (02/24/21 1038)  . diltiazem (CARDIZEM) infusion 10 mg/hr (02/24/21 0157)  . lactated ringers 75 mL/hr at 02/23/21 2304   PRN Meds: acetaminophen, HYDROcodone-acetaminophen, HYDROmorphone (DILAUDID) injection  Allergies:    Allergies  Allergen Reactions  . Penicillins Swelling and Other (See Comments)    Ankles and feet swell  . Zocor [Simvastatin] Other (See Comments)    Weak and dizzy  . Nsaids Other (See Comments)    Tylenol is all that is permitted  Social History:   Social History   Socioeconomic History  . Marital status: Widowed    Spouse name: Not on file  . Number of children: Not on file  . Years of education: Not on file  . Highest education level: Not on file  Occupational History  . Occupation: Retired  Tobacco Use  . Smoking status: Never Smoker  . Smokeless tobacco: Never Used  Substance and Sexual Activity  . Alcohol use: No  . Drug use: No  . Sexual activity: Not on file  Other Topics Concern  . Not on file  Social History Narrative  . Not on file   Social Determinants of Health   Financial Resource Strain: Not on file  Food Insecurity: Not on file  Transportation Needs: Not on file  Physical Activity: Not on file  Stress: Not on file  Social Connections: Not on file  Intimate Partner Violence: Not on file    Family History:    Family History   Problem Relation Age of Onset  . CVA Father   . CVA Mother      ROS:  Please see the history of present illness.  All other ROS reviewed and negative.     Physical Exam/Data:   Vitals:   02/23/21 2315 02/24/21 0336 02/24/21 0753 02/24/21 0900  BP: 101/62 104/75 105/68   Pulse: 75 96 99   Resp: 15 16 16    Temp: 97.9 F (36.6 C) 97.6 F (36.4 C) 98.3 F (36.8 C)   TempSrc: Oral Axillary Oral   SpO2: 97% 96%    Weight:  50.7 kg    Height:    5' (1.524 m)    Intake/Output Summary (Last 24 hours) at 02/24/2021 1050 Last data filed at 02/24/2021 0013 Gross per 24 hour  Intake 331.6 ml  Output --  Net 331.6 ml   Last 3 Weights 02/24/2021 02/23/2021 04/24/2020  Weight (lbs) 111 lb 12.4 oz 106 lb 11.2 oz 112 lb 10.5 oz  Weight (kg) 50.7 kg 48.4 kg 51.1 kg     Body mass index is 21.83 kg/m.  General: Frail elderly WF in no acute distress. Head: Normocephalic, atraumatic, sclera non-icteric, no xanthomas, nares are without discharge. Neck: Negative for carotid bruits. JVP not elevated. Lungs: Coarse BS without wheezing or rhonchi. Breathing is unlabored on [place]. Heart: Irregularly irregular, rate upper normal, S1 S2 without murmurs, rubs, or gallops.  Abdomen: Soft, non-tender, non-distended with normoactive bowel sounds. No rebound/guarding. Extremities: No clubbing or cyanosis. No edema. Distal pedal pulses are 2+ and equal bilaterally. Neuro: Alert and oriented X 3. Moves all extremities spontaneously. Facial movements in tact and symmetrical. Psych:  Responds to questions appropriately with a normal affect.   EKG:  The EKG was personally reviewed and demonstrates:  atrial fib 142bpm, subtle flat/upsloped ST depression I, II, avF, V4-V6 unchanged from prior  Telemetry:  Telemetry was personally reviewed and demonstrates: atrial fib rates currently 90s-105  Relevant CV Studies: 2D echo 04/16/20  1. Left ventricular ejection fraction, by estimation, is 50 to 55%. The  left  ventricle has low normal function. The left ventricle has no regional  wall motion abnormalities. There is mild concentric left ventricular  hypertrophy. Left ventricular  diastolic parameters are indeterminate.  2. Right ventricular systolic function is mildly reduced. The right  ventricular size is normal. There is moderately elevated pulmonary artery  systolic pressure.  3. Left atrial size was severely dilated.  4. Right atrial size was severely dilated.  5.  The mitral valve is normal in structure. Mild mitral valve  regurgitation. No evidence of mitral stenosis.  6. Tricuspid valve regurgitation is moderate to severe.  7. The aortic valve is normal in structure. Aortic valve regurgitation is  not visualized. No aortic stenosis is present.  8. The inferior vena cava is normal in size with greater than 50%  respiratory variability, suggesting right atrial pressure of 3 mmHg.   Laboratory Data:  High Sensitivity Troponin:  No results for input(s): TROPONINIHS in the last 720 hours.   Chemistry Recent Labs  Lab 02/23/21 0212 02/23/21 1850 02/24/21 0417  NA 141 135 136  K 4.8 4.4 3.9  CL 107 102 103  CO2 24 20* 25  GLUCOSE 144* 133* 130*  BUN 34* 50* 47*  CREATININE 1.84* 2.04* 2.03*  CALCIUM 8.5* 8.3* 8.0*  GFRNONAA 26* 23* 23*  ANIONGAP 10 13 8     No results for input(s): PROT, ALBUMIN, AST, ALT, ALKPHOS, BILITOT in the last 168 hours. Hematology Recent Labs  Lab 02/22/21 1334 02/22/21 1700 02/22/21 2255 02/23/21 0212 02/24/21 0417  WBC 17.9*  --   --  15.8* 21.2*  RBC 5.06  --   --  4.67 3.77*  HGB 14.1   < > 13.6 13.0 10.7*  HCT 44.5   < > 42.5 40.5 32.7*  MCV 87.9  --   --  86.7 86.7  MCH 27.9  --   --  27.8 28.4  MCHC 31.7  --   --  32.1 32.7  RDW 17.9*  --   --  17.2* 17.2*  PLT 226  --   --  189 144*   < > = values in this interval not displayed.   BNP Recent Labs  Lab 02/23/21 2130  BNP 220.2*    DDimer No results for input(s): DDIMER in  the last 168 hours.   Radiology/Studies:  CT Head Wo Contrast  Result Date: 02/22/2021 CLINICAL DATA:  Fall, head trauma EXAM: CT HEAD WITHOUT CONTRAST TECHNIQUE: Contiguous axial images were obtained from the base of the skull through the vertex without intravenous contrast. COMPARISON:  02/07/2018 FINDINGS: Brain: No evidence of acute infarction, hemorrhage, hydrocephalus, extra-axial collection or mass lesion/mass effect. Advanced low-density changes within the periventricular and subcortical white matter compatible with chronic microvascular ischemic change. Mild diffuse cerebral volume loss. Vascular: Atherosclerotic calcifications involving the large vessels of the skull base. No unexpected hyperdense vessel. Skull: Normal. Negative for fracture or focal lesion. Sinuses/Orbits: No acute finding. Other: Negative for scalp hematoma. IMPRESSION: 1. No acute intracranial findings. 2. Chronic microvascular ischemic change and cerebral volume loss. Electronically Signed   By: Davina Poke D.O.   On: 02/22/2021 14:16   CT PELVIS WO CONTRAST  Result Date: 02/22/2021 CLINICAL DATA:  Unwitnessed fall in a bathroom today. Right hip pain. EXAM: CT PELVIS WITHOUT CONTRAST TECHNIQUE: Multidetector CT imaging of the pelvis was performed following the standard protocol without intravenous contrast. COMPARISON:  Current right hip radiographs. FINDINGS: Musculoskeletal: Right pelvic fractures. There is a nondisplaced, coronal oblique fracture extending across the right ilium from the inferior margin of the SI joint 2 the level of the posterosuperior right acetabulum. Another fracture crosses the pubis at its junction with the medial acetabulum, displaced by 5 mm. A third fracture is noted of the inferior pubic ramus at the level of the abductor muscle origins. No other fractures. No bone lesions. Hip joints, SI joints and pubic symphysis are normally aligned. There is soft tissue hemorrhage at  lies medial to the  right pubis and anterior medial right acetabulum, mildly indenting the bladder. Urinary Tract: No bladder mass, wall thickening or stone. Visualized ureters normal in course and in caliber. Bowel: Rectum moderately distended with stool. Multiple sigmoid colon diverticula. No bowel wall thickening or inflammation. Vascular/Lymphatic: Aortoiliac atherosclerotic calcifications stable from the previous CT. No enlarged lymph nodes. Reproductive:  Status post hysterectomy.  No pelvic masses. Other:  None. IMPRESSION: 1. Fractures of the right hemipelvis as detailed above, involving the right ileum, junction of the right pubis and anteromedial acetabulum and of the inferior right pubic ramus. Associated extraperitoneal hemorrhage along the right anterior inferior pelvis. 2. No fracture of the right proximal femur. No dislocation. No bone lesions. Electronically Signed   By: Lajean Manes M.D.   On: 02/22/2021 12:27   DG Chest Port 1 View  Result Date: 02/23/2021 CLINICAL DATA:  Shortness of breath, history CHF, hypertension, atrial fibrillation EXAM: PORTABLE CHEST 1 VIEW COMPARISON:  Portable exam 1824 hours compared to 02/22/2021 FINDINGS: Kyphotic positioning with artifacts project over LEFT chest. Enlargement of cardiac silhouette with slight vascular congestion. Atherosclerotic calcification aorta. Mild perihilar infiltrates likely pulmonary edema. No pleural effusion or pneumothorax. Marked osseous demineralization. IMPRESSION: Enlargement of cardiac silhouette with suspect mild pulmonary edema. Aortic Atherosclerosis (ICD10-I70.0). Electronically Signed   By: Lavonia Dana M.D.   On: 02/23/2021 18:41   DG Chest Port 1 View  Result Date: 02/22/2021 CLINICAL DATA:  Fall EXAM: PORTABLE CHEST 1 VIEW COMPARISON:  04/23/2020 FINDINGS: Cardiomegaly, vascular congestion. No overt edema, confluent opacities or effusions. Aortic atherosclerosis. No acute bony abnormality. IMPRESSION: Cardiomegaly, vascular congestion.  Aortic atherosclerosis. Electronically Signed   By: Rolm Baptise M.D.   On: 02/22/2021 14:11   DG Knee Right Port  Result Date: 02/23/2021 CLINICAL DATA:  87 year old with acute knee pain. EXAM: PORTABLE RIGHT KNEE - 1-2 VIEW COMPARISON:  None. FINDINGS: Negative for fracture or dislocation. Extensive osteophytosis in the lateral knee compartment and patellofemoral compartment. Question a small joint effusion. Diffuse vascular calcifications. IMPRESSION: Osteoarthritis in the right knee without acute bone abnormality. Electronically Signed   By: Markus Daft M.D.   On: 02/23/2021 11:54   DG Hip Unilat  With Pelvis 2-3 Views Right  Result Date: 02/22/2021 CLINICAL DATA:  Unwitnessed fall.  Right hip pain. EXAM: DG HIP (WITH OR WITHOUT PELVIS) 2-3V RIGHT COMPARISON:  02/15/2016. FINDINGS: Fracture of the right pubis adjacent to the medial right acetabulum, minimally displaced, approximately 4 mm. There is a fracture of the inferior pubic, without significant displacement. A subtle fracture line is suggested of the right ilium just lateral to the inferior right SI joint. No other fractures.  Specifically, no right proximal femur fracture. Hip joints, SI joints and pubic symphysis are normally aligned. Skeletal structures are diffusely demineralized. IMPRESSION: 1. Right pelvic fractures. Fracture at the junction of the right pubis with the medial acetabulum, fracture of the inferior right pubic ramus and a questionable fracture of right ilium just lateral to the inferior right SI joint. 2. No right proximal femur fracture.  No dislocation. Electronically Signed   By: Lajean Manes M.D.   On: 02/22/2021 12:08     Assessment and Plan:   1. Mechanical fall with multiple pelvic fractures - per ortho and trauma who recommended nonoperative intervention and WBAT  2. Permanent atrial fibrillation, here with RVR - rates now 90s-105, which is similar to prior discharge rate in 04/2020 - we will transition off IV  diltiazem back to  oral diltiazem 60mg  q6hr, can consolidate to home in AM if tolerating well with stable BP - if needed, tomorrow can reintroduce beta blocker as well as she was on Toprol as OP as well - continue anticoagulation and recommend transition back to [facility] when OK with primary team - recommend close f/u as OP to ensure she remains candidate for ongoing [place] to ensure no further falls - check TSH with AM labs  3. Acute hypoxic respiratory failure - interestingly had similar clinical scenario in 04/2020 ultimately felt multifactorial (PNA, a/c diastolic CHF, also question of underlying interstitial lung disease +/- chronic bronchitis/RAD on CT) - required home O2 at dc - + concern for aspiration noted with coughing while eating, rising WBC, rising procalcitonin - will discuss volume regimen with MD - currently getting IVF for AKI - will need assessment for home O2 as outpatient and f/u with primary care / pulmonology given suspected underlying pulmonary disease - echo pending  3. Chronic diastolic CHF, moderate pulmonary HTN - BP controlled - still experiencing elevated creatinine above baseline, I/o's not reliable - will discuss volume management with MD  4. AKI superimposed on CKD stage II - bladder scan pending - prior DC Cr 1.08 in 04/2020, here 1.57 -> IV Lasix -> 1.84 -> 2.04 -> 2.03 - avoid nephrotoxic agents - will order strict I/O's  5. Essential HTN - BP low/normal, manage in context above  6. Anemia - decline in Hgb noted from 13->10.7 - could also be contributing to elevated rates - will order FOBT - per medicine team  Risk Assessment/Risk Scores:       New York Heart Association (NYHA) Functional Class NYHA Class III  CHA2DS2-VASc Score = 5  This indicates a 7.2% annual risk of stroke. The patient's score is based upon: CHF History: Yes HTN History: Yes Diabetes History: No Stroke History: No Vascular Disease History: No Age Score: 2 Gender Score: 1          For questions or updates, please contact [place] Please consult [email or web address] for contact info under    Signed, Charlie Pitter, PA-C  02/24/2021 10:50 AM

## 2021-02-24 NOTE — Progress Notes (Addendum)
PROGRESS NOTE    Kelli Peterson  ZOX:[id number] DOB: 1930-01-24 DOA: 02/22/2021 PCP: [name], DO   Chief Complaint  Patient presents with  . Fall  . Hip Pain  Brief Narrative: 87 year old female with multiple comorbidities with A. fib on Eliquis, CAD, CKD stage III AAA, hypertension hyperlipidemia history of DVT chronic diastolic CHF presented from home with unwitnessed fall in the bathroom and was having right hip pain since then.  She has brought to the ED.  She was found to have right pelvic fracture in the x-ray and the right hip CT work-up showed elevated creatinine. Patient was transferred to [facility] seen by trauma surgery orthopedic surgery. She was hypoxic given Lasix and also having A. fib with RVR and was placed on Cardizem drip.  Subjective: Seen/examined Daughter at bedside Sleepy- woke on calling- appeared alert, knonw she is at [facility] due to [facility]. No pain while rested but rt hip hurts on movement. On 3l [place] Denies any chest pain shortness of breath dysurea abdomen pain, headache. Remains on cardizem gtt  Assessment & Plan:  Ground-level fall Right pelvic fracture (Rt inferior pubic ramus, junction of Rt pubic ramus and acetabulum and iliac wing) Seen by trauma surgery, orthopedic-advised nonoperative intervention PT OT WBAT, and follow-up with Dr. [name] in 2 to 3 weeks.Cont  pain control oral and IV opiates.  Started on diet by ortho. May need SNF.  AKI and CKD,stage IIIa: Baseline creatinine ranging 1.1-1.4.Creatinine uptrending-gently hydrated overnight but CXR w/ pulm edema- lungs are clear, appears dry, cont ivf w. close monitoring on resp status. Check UA/UOP.Holding lasix. Recent Labs  Lab 02/22/21 1334 02/23/21 0212 02/23/21 1850 02/24/21 0417  BUN 32* 34* 50* 47*  CREATININE 1.57* 1.84* 2.04* 2.03*   Atrial fibrillation with rapid ventricular response: On Cardizem drip still down from 15 to 10 mg/hr this am, cardio consulted.echo pending.On eliquis  2.5 mg bid- consult pharmacy to convert to sq lovenox.   Leukocytosis:unclear etiology. ? From #1, r/o infection, at risk of aspiration. Patient had a similar episode leukocytosis last admission in July we will send procalcitonin, UA,blood culture.Chest x-ray-enlarged cardiac silhouette with suspected mild pulmonary edema. Has had no fever, but at risk of aspiration-npo,slp eval pending. empirically start Unasyn  Pending further workup- has tolerated back in July 2021 ( although penicillin allergy listed as ankle swelling).  Previous imaging there is concern for pulmonary vascular disease vs ILD. Recent Labs  Lab 02/22/21 1334 02/23/21 0212 02/24/21 0417  WBC 17.9* 15.8* 21.2*  PROCALCITON  --   --  0.78   Chronic diastolic CHF/x-ray showing mild pulmonary edema/congestion: needing gentle IV fluids 2/2 npo and aki,holding Lasix.  Monitor. bnp  In 220, previously 528.  Echocardiogram pending.  Acute hypoxemic respiratory failure/Concern for pulmonary vascular disease versus ILD: 89% on room air on admission with chest x-ray with vascular congestion status post dose of Lasix on admission. last EF 50 to 55%.  Still needing oxygen, unable to diurese given her AKI. Cardio consulted.  Has had similar hypoxia on previous admission in July.  Lethargy:Patient has been lethargic/sleepy on and off. But coherent and interactive once awake. Similar episode when she had AKI few years ago per daughter. Cont hydration. Non focal on exam.  Choking episode with eating she has had similar issues in the past.  Speech therapy consulted continue to keep n.p.o. until then.  Speech worked with her and placed on regular diet.  Anemia hemoglobin has dropped in 10 gm, ?  Acute  blood loss due to #1.  Monitor H&H Recent Labs  Lab 02/22/21 1334 02/22/21 1700 02/22/21 2255 02/23/21 0212 02/24/21 0417  HGB 14.1 13.1 13.6 13.0 10.7*  HCT 44.5 42.1 42.5 40.5 32.7*   CAD/HTN/HLD:BP stable.hold po meds.Patient on  metoprolol Cardizem and Demadex at home currently on hold.  Goals of care: Full code with multiple comorbidities, advanced age and now with recent fall with multiple fractures prognosis does not appear bright. I will consult palliative care to discuss CODE STATUS/GOC -daughter agrees.  Protein-calorie malnutrition severe as below continue to augment diet as tolerated Nutrition Problem: Severe Malnutrition Etiology: chronic illness Signs/Symptoms: energy intake < 75% for > or equal to 1 month,moderate fat depletion,severe fat depletion,moderate muscle depletion,severe muscle depletion,percent weight loss Percent weight loss: 8 % Interventions: Ensure Enlive (each supplement provides 350kcal and 20 grams of protein),Magic cup,MVI  Diet Order            Diet regular Room service appropriate? Yes; Fluid consistency: Thin  Diet effective now               Patient's Body mass index is 21.83 kg/m. DVT prophylaxis: SCDs Start: 02/22/21 1448 SCDs Start: 02/22/21 1448 Code Status:   Code Status: Full Code  Family Communication: plan of care discussed with patient and her daughter was updated extensively Discussed overall plan and that her prognosis does not appear bright given her fall multiple fractures multiple co-morbidities Requested palliative are eval  Status is: Inpatient Remains inpatient appropriate because:IV treatments appropriate due to intensity of illness or inability to take PO and Inpatient level of care appropriate due to severity of illness  Dispo: The patient is from: Home              Anticipated d/c is to: SNF              Patient currently is not medically stable to d/c.   Difficult to place patient No  Unresulted Labs (From admission, onward)          Start     Ordered   02/25/21 0500  Procalcitonin  Daily,   R     Question:  Specimen collection method  Answer:  Lab=Lab collect   02/24/21 0724   02/25/21 0500  Brain natriuretic peptide  Tomorrow morning,   R        Question:  Specimen collection method  Answer:  Lab=Lab collect   02/24/21 1130   02/25/21 0500  TSH  Tomorrow morning,   R       Question:  Specimen collection method  Answer:  Lab=Lab collect   02/24/21 1130   02/24/21 0727  Culture, blood (Routine X 2) w Reflex to ID Panel  BLOOD CULTURE X 2,   R (with TIMED occurrences)      02/24/21 0727   02/24/21 0625  Urinalysis, Routine w reflex microscopic Urine, Clean Catch  ONCE - STAT,   STAT        02/24/21 0624   02/23/21 0500  CBC  Daily,   R     Question:  Specimen collection method  Answer:  Lab=Lab collect   02/22/21 1447   02/23/21 4193  Basic metabolic panel  Daily,   R     Question:  Specimen collection method  Answer:  Lab=Lab collect   02/22/21 1447   Unscheduled  Occult blood card to lab, stool RN will collect  As needed,   R     Question:  Specimen to  be collected by:  Answer:  RN will collect   02/24/21 1139          Medications reviewed:  Scheduled Meds: . brimonidine  1 drop Right Eye BID  . diltiazem  60 mg Oral Q6H  . dorzolamide-timolol  1 drop Both Eyes BID  . enoxaparin (LOVENOX) injection  50 mg Subcutaneous Q24H  . feeding supplement  237 mL Oral Q24H  . latanoprost  1 drop Both Eyes Q1200  . mouth rinse  15 mL Mouth Rinse BID   Continuous Infusions: . ampicillin-sulbactam (UNASYN) IV 3 g (02/24/21 1038)  . lactated ringers 75 mL/hr at 02/23/21 2304    Consultants:see note  Procedures:see note  Antimicrobials: Anti-infectives (From admission, onward)   Start     Dose/Rate Route Frequency Ordered Stop   02/24/21 1100  Ampicillin-Sulbactam (UNASYN) 3 g in sodium chloride 0.9 % 100 mL IVPB        3 g 200 mL/hr over 30 Minutes Intravenous Every 12 hours 02/24/21 0928       Culture/Microbiology    Component Value Date/Time   SDES URINE, RANDOM 04/19/2020 1329   SPECREQUEST NONE 04/19/2020 1329   CULT  04/19/2020 1329    NO GROWTH Performed at [facility] [address], [place],  [place] [address]    REPTSTATUS 04/20/2020 FINAL 04/19/2020 1329    Other culture-see note  Objective: Vitals: Today's Vitals   02/24/21 0753 02/24/21 0900 02/24/21 1004 02/24/21 1133  BP: 105/68   101/67  Pulse: 99   97  Resp: 16   18  Temp: 98.3 F (36.8 C)   98 F (36.7 C)  TempSrc: Oral   Oral  SpO2:    99%  Weight:      Height:  5' (1.524 m)    PainSc:   0-No pain     Intake/Output Summary (Last 24 hours) at 02/24/2021 1253 Last data filed at 02/24/2021 1100 Gross per 24 hour  Intake 331.6 ml  Output 140 ml  Net 191.6 ml   Filed Weights   02/23/21 0358 02/24/21 0336  Weight: 48.4 kg 50.7 kg   Weight change: 2.3 kg  Intake/Output from previous day: 05/23 0701 - 05/24 0700 In: 451.6 [P.O.:120; I.V.:331.6] Out: -  Intake/Output this shift: Total I/O In: -  Out: 140 [Urine:140] Filed Weights   02/23/21 0358 02/24/21 0336  Weight: 48.4 kg 50.7 kg    Examination: General exam: sleepy, but wakes and able to answers questions well, elderly, on 3l [place], frail HEENT:Oral mucosa DRY, Ear/Nose WNL grossly, dentition normal. Respiratory system: bilaterally clear no crackles, no use of accessory muscle Cardiovascular system: S1 & S2 +, No JVD,. Gastrointestinal system: Abdomen soft,, NT,ND, BS+ Nervous System: wakes up and interacts appropriately, moving b/l UE well, LE mobility affected 2.2 pain, wiggles toes. Extremities: no edema, distal peripheral pulses palpable.  Skin: No rashes,no icterus. MSK: Normal muscle bulk,tone, power Data Reviewed: I have personally reviewed following labs and imaging studies CBC: Recent Labs  Lab 02/22/21 1334 02/22/21 1700 02/22/21 2255 02/23/21 0212 02/24/21 0417  WBC 17.9*  --   --  15.8* 21.2*  NEUTROABS 16.0*  --   --   --   --   HGB 14.1 13.1 13.6 13.0 10.7*  HCT 44.5 42.1 42.5 40.5 32.7*  MCV 87.9  --   --  86.7 86.7  PLT 226  --   --  189 973*   Basic Metabolic Panel: Recent Labs  Lab 02/22/21  1334 02/23/21 0212  02/23/21 1850 02/24/21 0417  NA 144 141 135 136  K 4.0 4.8 4.4 3.9  CL 105 107 102 103  CO2 27 24 20* 25  GLUCOSE 151* 144* 133* 130*  BUN 32* 34* 50* 47*  CREATININE 1.57* 1.84* 2.04* 2.03*  CALCIUM 9.2 8.5* 8.3* 8.0*   GFR: Estimated Creatinine Clearance: 13.8 mL/min (A) (by C-G formula based on SCr of 2.03 mg/dL (H)). Liver Function Tests: No results for input(s): AST, ALT, ALKPHOS, BILITOT, PROT, ALBUMIN in the last 168 hours. No results for input(s): LIPASE, AMYLASE in the last 168 hours. No results for input(s): AMMONIA in the last 168 hours. Coagulation Profile: No results for input(s): INR, PROTIME in the last 168 hours. Cardiac Enzymes: No results for input(s): CKTOTAL, CKMB, CKMBINDEX, TROPONINI in the last 168 hours. BNP (last 3 results) No results for input(s): PROBNP in the last 8760 hours. HbA1C: No results for input(s): HGBA1C in the last 72 hours. CBG: No results for input(s): GLUCAP in the last 168 hours. Lipid Profile: No results for input(s): CHOL, HDL, LDLCALC, TRIG, CHOLHDL, LDLDIRECT in the last 72 hours. Thyroid Function Tests: No results for input(s): TSH, T4TOTAL, FREET4, T3FREE, THYROIDAB in the last 72 hours. Anemia Panel: No results for input(s): VITAMINB12, FOLATE, FERRITIN, TIBC, IRON, RETICCTPCT in the last 72 hours. Sepsis Labs: Recent Labs  Lab 02/24/21 0417  PROCALCITON 0.78    Recent Results (from the past 240 hour(s))  Resp Panel by RT-PCR (Flu A&B, Covid) Nasopharyngeal Swab     Status: None   Collection Time: 02/22/21  2:50 PM   Specimen: Nasopharyngeal Swab; Nasopharyngeal(NP) swabs in vial transport medium  Result Value Ref Range Status   SARS Coronavirus 2 by RT PCR NEGATIVE NEGATIVE Final    Comment: (NOTE) SARS-CoV-2 target nucleic acids are NOT DETECTED.  The SARS-CoV-2 RNA is generally detectable in upper respiratory specimens during the acute phase of infection. The lowest concentration of SARS-CoV-2 viral copies this  assay can detect is 138 copies/mL. A negative result does not preclude SARS-Cov-2 infection and should not be used as the sole basis for treatment or other patient management decisions. A negative result may occur with  improper specimen collection/handling, submission of specimen other than nasopharyngeal swab, presence of viral mutation(s) within the areas targeted by this assay, and inadequate number of viral copies(<138 copies/mL). A negative result must be combined with clinical observations, patient history, and epidemiological information. The expected result is Negative.  Fact Sheet for Patients:  EntrepreneurPulse.com.au  Fact Sheet for Healthcare Providers:  IncredibleEmployment.be  This test is no t yet approved or cleared by the [place] FDA and  has been authorized for detection and/or diagnosis of SARS-CoV-2 by FDA under an Emergency Use Authorization (EUA). This EUA will remain  in effect (meaning this test can be used) for the duration of the COVID-19 declaration under Section 564(b)(1) of the Act, 21 U.S.C.section 360bbb-3(b)(1), unless the authorization is terminated  or revoked sooner.       Influenza A by PCR NEGATIVE NEGATIVE Final   Influenza B by PCR NEGATIVE NEGATIVE Final    Comment: (NOTE) The Xpert Xpress SARS-CoV-2/FLU/RSV plus assay is intended as an aid in the diagnosis of influenza from Nasopharyngeal swab specimens and should not be used as a sole basis for treatment. Nasal washings and aspirates are unacceptable for Xpert Xpress SARS-CoV-2/FLU/RSV testing.  Fact Sheet for Patients: EntrepreneurPulse.com.au  Fact Sheet for Healthcare Providers: IncredibleEmployment.be  This test is not yet approved  or cleared by the [place] and has been authorized for detection and/or diagnosis of SARS-CoV-2 by FDA under an Emergency Use Authorization (EUA). This EUA will  remain in effect (meaning this test can be used) for the duration of the COVID-19 declaration under Section 564(b)(1) of the Act, 21 U.S.C. section 360bbb-3(b)(1), unless the authorization is terminated or revoked.  Performed at [facility], [place] [address], [place], [place] [address]      Radiology Studies: CT Head Wo Contrast  Result Date: 02/22/2021 CLINICAL DATA:  Fall, head trauma EXAM: CT HEAD WITHOUT CONTRAST TECHNIQUE: Contiguous axial images were obtained from the base of the skull through the vertex without intravenous contrast. COMPARISON:  02/07/2018 FINDINGS: Brain: No evidence of acute infarction, hemorrhage, hydrocephalus, extra-axial collection or mass lesion/mass effect. Advanced low-density changes within the periventricular and subcortical white matter compatible with chronic microvascular ischemic change. Mild diffuse cerebral volume loss. Vascular: Atherosclerotic calcifications involving the large vessels of the skull base. No unexpected hyperdense vessel. Skull: Normal. Negative for fracture or focal lesion. Sinuses/Orbits: No acute finding. Other: Negative for scalp hematoma. IMPRESSION: 1. No acute intracranial findings. 2. Chronic microvascular ischemic change and cerebral volume loss. Electronically Signed   By: Davina Poke D.O.   On: 02/22/2021 14:16   DG Chest Port 1 View  Result Date: 02/23/2021 CLINICAL DATA:  Shortness of breath, history CHF, hypertension, atrial fibrillation EXAM: PORTABLE CHEST 1 VIEW COMPARISON:  Portable exam 1824 hours compared to 02/22/2021 FINDINGS: Kyphotic positioning with artifacts project over LEFT chest. Enlargement of cardiac silhouette with slight vascular congestion. Atherosclerotic calcification aorta. Mild perihilar infiltrates likely pulmonary edema. No pleural effusion or pneumothorax. Marked osseous demineralization. IMPRESSION: Enlargement of cardiac silhouette with suspect mild pulmonary edema. Aortic  Atherosclerosis (ICD10-I70.0). Electronically Signed   By: Lavonia Dana M.D.   On: 02/23/2021 18:41   DG Chest Port 1 View  Result Date: 02/22/2021 CLINICAL DATA:  Fall EXAM: PORTABLE CHEST 1 VIEW COMPARISON:  04/23/2020 FINDINGS: Cardiomegaly, vascular congestion. No overt edema, confluent opacities or effusions. Aortic atherosclerosis. No acute bony abnormality. IMPRESSION: Cardiomegaly, vascular congestion. Aortic atherosclerosis. Electronically Signed   By: Rolm Baptise M.D.   On: 02/22/2021 14:11   DG Knee Right Port  Result Date: 02/23/2021 CLINICAL DATA:  87 year old with acute knee pain. EXAM: PORTABLE RIGHT KNEE - 1-2 VIEW COMPARISON:  None. FINDINGS: Negative for fracture or dislocation. Extensive osteophytosis in the lateral knee compartment and patellofemoral compartment. Question a small joint effusion. Diffuse vascular calcifications. IMPRESSION: Osteoarthritis in the right knee without acute bone abnormality. Electronically Signed   By: Markus Daft M.D.   On: 02/23/2021 11:54     LOS: 2 days   Antonieta Pert, MD [facility]  02/24/2021, 12:53 PM

## 2021-02-24 NOTE — Plan of Care (Signed)
  Problem: Health Behavior/Discharge Planning: Goal: Ability to manage health-related needs will improve Outcome: Not Progressing   Problem: Activity: Goal: Risk for activity intolerance will decrease Outcome: Not Progressing   

## 2021-02-24 NOTE — Evaluation (Signed)
Clinical/Bedside Swallow Evaluation Patient Details  Name: Kelli Peterson MRN: [id number] Date of Birth: 1930-05-26  Today's Date: 02/24/2021 Time: SLP Start Time (ACUTE ONLY): 9476 SLP Stop Time (ACUTE ONLY): 1016 SLP Time Calculation (min) (ACUTE ONLY): 23 min  Past Medical History:  Past Medical History:  Diagnosis Date  . Adenomatous colon polyp 2007  . Barrett's esophagus 2007  . 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 Cervical cancer ([place])   . Chronic diastolic CHF (congestive heart failure) ([place])   . CKD (chronic kidney disease), stage II   . Closed pelvic fracture ([place])   . DVT (deep venous thrombosis) ([place])   . Fibrocystic breast disease   . Fracture of pubic ramus ([place])   . Hyperlipidemia   . Hypertension   . Iron deficiency anemia   . Macular degeneration   . Permanent atrial fibrillation ([place])   . Pulmonary hypertension ([place])   . Tricuspid regurgitation    Past Surgical History:  Past Surgical History:  Procedure Laterality Date  . CATARACT EXTRACTION Right 02/15/2010   [facility]  . CATARACT EXTRACTION Left 01/30/2010   [facility]  . CATARACT EXTRACTION, BILATERAL  2010  . TOTAL ABDOMINAL HYSTERECTOMY  1964   HPI:  Patient is an 87 y/o female who presents with right pelvic fxs (right inferior pubic ramus, junction of right pubic ramus and acetabulum and iliac wing) s/p fall, also noted to have A-fib with RVR and elevated creatinine. CXR-vascular congestion. Pt was observed to be coughing with PO intake on 5/23 and was made NPO later that night due to lethargy. MBS July 2021 revealed an oropharyngeal dysphagia with aspiration that was silent with thin liquids and inconsistently sensed with nectar thick liquids. Backflow was also noted within the esophagus. Dys 3 diet and nectar thick liquids were recommended with strict use of a chin tuck. PMH includes Barrett's esophagus, cerebellar hemorrhage, HTN, CHF, A-fib, cervical ca, CAD, CKD stage III, DVT, AAA.   Assessment / Plan /  Recommendation Clinical Impression  Pt has a h/o oropharyngeal dysphagia that includes silent aspiration, but she had stopped using thickened liquids on her own because per her report and daughter's, she was not going to drink them. She has no overt s/s of aspiration while eating and drinking and is much more alert this morning per family report. She has mild oral residue and her dentures are loose, but she compensates for this with Min cues. We did discuss that particularly with her h/o silent aspiration, aspiration cannot be ruled out at bedside. MBS was offered, but given that pt and daughter both do not want to pursue diet modifications, they would also like to hold off on completing this at this time. They would consider MBS if she were to start exhibiting any negative side effects from PO intake. CXR was also without overt evidence of infection upon admission. Will resume regular diet and thin liquids from earlier this admission, but will continue to follow given h/o dysphagia. SLP Visit Diagnosis: Dysphagia, unspecified (R13.10)    Aspiration Risk  Mild aspiration risk;Moderate aspiration risk    Diet Recommendation Regular;Thin liquid   Liquid Administration via: Cup;Straw Medication Administration: Whole meds with puree Supervision: Patient able to self feed;Intermittent supervision to cue for compensatory strategies Compensations: Minimize environmental distractions;Slow rate;Small sips/bites Postural Changes: Seated upright at 90 degrees    Other  Recommendations Oral Care Recommendations: Oral care BID   Follow up Recommendations  (tba)      Frequency and Duration min 2x/week  1  week       Prognosis Prognosis for Safe Diet Advancement: Good      Swallow Study   General HPI: Patient is an 87 y/o female who presents with right pelvic fxs (right inferior pubic ramus, junction of right pubic ramus and acetabulum and iliac wing) s/p fall, also noted to have A-fib with RVR and  elevated creatinine. CXR-vascular congestion. Pt was observed to be coughing with PO intake on 5/23 and was made NPO later that night due to lethargy. MBS July 2021 revealed an oropharyngeal dysphagia with aspiration that was silent with thin liquids and inconsistently sensed with nectar thick liquids. Backflow was also noted within the esophagus. Dys 3 diet and nectar thick liquids were recommended with strict use of a chin tuck. PMH includes Barrett's esophagus, cerebellar hemorrhage, HTN, CHF, A-fib, cervical ca, CAD, CKD stage III, DVT, AAA. Type of Study: Bedside Swallow Evaluation Previous Swallow Assessment: see HPI Diet Prior to this Study: NPO Temperature Spikes Noted: No Respiratory Status: Nasal cannula History of Recent Intubation: No Behavior/Cognition: Alert;Cooperative Oral Cavity Assessment: Within Functional Limits Oral Care Completed by SLP: No Oral Cavity - Dentition: Dentures, top;Dentures, bottom (loosely fitting) Vision: Functional for self-feeding Self-Feeding Abilities: Able to feed self Patient Positioning: Upright in bed Baseline Vocal Quality: Normal Volitional Cough: Strong Volitional Swallow: Able to elicit    Oral/Motor/Sensory Function Overall Oral Motor/Sensory Function: Within functional limits   Ice Chips Ice chips: Not tested   Thin Liquid Thin Liquid: Within functional limits Presentation: Cup;Self Fed;Straw    Nectar Thick Nectar Thick Liquid: Not tested   Honey Thick Honey Thick Liquid: Not tested   Puree Puree: Within functional limits Presentation: Self Fed;Spoon   Solid     Solid: Impaired Presentation: Self Fed Oral Phase Functional Implications: Oral residue      Osie Bond., M.A. [place] Pager [phone] Office [phone]  02/24/2021,11:07 AM

## 2021-02-24 NOTE — Progress Notes (Signed)
  Echocardiogram 2D Echocardiogram has been performed.  Kelli Peterson 02/24/2021, 4:11 PM

## 2021-02-25 DIAGNOSIS — Z515 Encounter for palliative care: Secondary | ICD-10-CM

## 2021-02-25 DIAGNOSIS — J9601 Acute respiratory failure with hypoxia: Secondary | ICD-10-CM | POA: Diagnosis not present

## 2021-02-25 DIAGNOSIS — Z66 Do not resuscitate: Secondary | ICD-10-CM

## 2021-02-25 DIAGNOSIS — N1831 Chronic kidney disease, stage 3a: Secondary | ICD-10-CM

## 2021-02-25 DIAGNOSIS — M25551 Pain in right hip: Secondary | ICD-10-CM

## 2021-02-25 DIAGNOSIS — S72009A Fracture of unspecified part of neck of unspecified femur, initial encounter for closed fracture: Secondary | ICD-10-CM | POA: Diagnosis not present

## 2021-02-25 DIAGNOSIS — Z7901 Long term (current) use of anticoagulants: Secondary | ICD-10-CM

## 2021-02-25 DIAGNOSIS — I5189 Other ill-defined heart diseases: Secondary | ICD-10-CM

## 2021-02-25 DIAGNOSIS — I4891 Unspecified atrial fibrillation: Secondary | ICD-10-CM | POA: Diagnosis not present

## 2021-02-25 LAB — TSH: TSH: 0.583 u[IU]/mL (ref 0.350–4.500)

## 2021-02-25 LAB — BASIC METABOLIC PANEL
Anion gap: 7 (ref 5–15)
BUN: 39 mg/dL — ABNORMAL HIGH (ref 8–23)
CO2: 24 mmol/L (ref 22–32)
Calcium: 8 mg/dL — ABNORMAL LOW (ref 8.9–10.3)
Chloride: 106 mmol/L (ref 98–111)
Creatinine, Ser: 1.61 mg/dL — ABNORMAL HIGH (ref 0.44–1.00)
GFR, Estimated: 31 mL/min — ABNORMAL LOW (ref >60)
Glucose, Bld: 117 mg/dL — ABNORMAL HIGH (ref 70–99)
Potassium: 3.4 mmol/L — ABNORMAL LOW (ref 3.5–5.1)
Sodium: 137 mmol/L (ref 135–145)

## 2021-02-25 LAB — CBC
HCT: 29.7 % — ABNORMAL LOW (ref 36.0–46.0)
Hemoglobin: 9.5 g/dL — ABNORMAL LOW (ref 12.0–15.0)
MCH: 27.8 pg (ref 26.0–34.0)
MCHC: 32 g/dL (ref 30.0–36.0)
MCV: 86.8 fL (ref 80.0–100.0)
Platelets: DECREASED 10*3/uL (ref 150–400)
RBC: 3.42 MIL/uL — ABNORMAL LOW (ref 3.87–5.11)
RDW: 17.1 % — ABNORMAL HIGH (ref 11.5–15.5)
WBC: 10.5 10*3/uL (ref 4.0–10.5)
nRBC: 0 % (ref 0.0–0.2)

## 2021-02-25 LAB — BRAIN NATRIURETIC PEPTIDE: B Natriuretic Peptide: 264.3 pg/mL — ABNORMAL HIGH (ref 0.0–100.0)

## 2021-02-25 LAB — PROCALCITONIN: Procalcitonin: 0.5 ng/mL

## 2021-02-25 MED ORDER — OXYCODONE HCL 5 MG PO TABS
5.0000 mg | ORAL_TABLET | Freq: Four times a day (QID) | ORAL | Status: DC | PRN
  Administered 2021-02-25 – 2021-02-28 (×2): 5 mg via ORAL
  Filled 2021-02-25 (×2): qty 1

## 2021-02-25 MED ORDER — ACETAMINOPHEN 500 MG PO TABS: 1000.0000 mg | ORAL_TABLET | Freq: Three times a day (TID) | ORAL | Status: DC

## 2021-02-25 MED ORDER — ACETAMINOPHEN 500 MG PO TABS
1000.0000 mg | ORAL_TABLET | Freq: Three times a day (TID) | ORAL | Status: DC
  Administered 2021-02-25 – 2021-03-03 (×18): 1000 mg via ORAL
  Filled 2021-02-25 (×17): qty 2

## 2021-02-25 MED ORDER — DILTIAZEM HCL ER COATED BEADS 120 MG PO CP24
240.0000 mg | ORAL_CAPSULE | Freq: Every day | ORAL | Status: DC
  Administered 2021-02-25 – 2021-03-03 (×7): 240 mg via ORAL
  Filled 2021-02-25 (×7): qty 2

## 2021-02-25 MED ORDER — ACETAMINOPHEN 325 MG PO TABS: 650.0000 mg | ORAL_TABLET | Freq: Four times a day (QID) | ORAL | Status: DC | PRN

## 2021-02-25 MED ORDER — POTASSIUM CHLORIDE 10 MEQ/100ML IV SOLN
10.0000 meq | INTRAVENOUS | Status: AC
  Administered 2021-02-25 (×2): 10 meq via INTRAVENOUS
  Filled 2021-02-25 (×2): qty 100

## 2021-02-25 MED ORDER — METOPROLOL SUCCINATE ER 50 MG PO TB24
50.0000 mg | ORAL_TABLET | Freq: Every day | ORAL | Status: DC
  Administered 2021-02-25 – 2021-03-02 (×6): 50 mg via ORAL
  Filled 2021-02-25 (×6): qty 1

## 2021-02-25 MED ORDER — APIXABAN 2.5 MG PO TABS
2.5000 mg | ORAL_TABLET | Freq: Two times a day (BID) | ORAL | Status: DC
  Administered 2021-02-25 – 2021-03-03 (×13): 2.5 mg via ORAL
  Filled 2021-02-25 (×13): qty 1

## 2021-02-25 NOTE — Progress Notes (Signed)
Physical Therapy Treatment Patient Details Name: Kelli Peterson MRN: [id number] DOB: 1930/01/30 Today's Date: 02/25/2021    History of Present Illness Patient is a 87 y/o female who presents with right pelvic fxs (right inferior pubic ramus, junction of right pubic ramus and acetabulum and iliac wing) s/p fall, also noted to have A-fib with RVR and elevated creatinine. CXR-vascular congestion. PMH includes HTN, CHF, A-fib, cervical ca, CAD, CKD stage III, DVT, AAA.    PT Comments    Pt received in supine, agreeable to therapy session and with good participation and tolerance for bed mobility and transfer training. Pt buckling on RLE with attempt at lateral stepping but able to stand x3 total reps (x2 to/from [place]) for totalA to pivot to chair safely. Reviewed supine/seated exercises with pt and her daughter and pt participatory as able with multimodal cues. Pt continues to benefit from PT services to progress toward functional mobility goals. Continue to recommend SNF.   Follow Up Recommendations  SNF;Supervision for mobility/OOB     Equipment Recommendations  None recommended by PT    Recommendations for Other Services       Precautions / Restrictions Precautions Precautions: Fall;Other (comment) Precaution Comments: watch 02, premedicate Restrictions Weight Bearing Restrictions: Yes RLE Weight Bearing: Weight bearing as tolerated Other Position/Activity Restrictions: WBAT BLEs    Mobility  Bed Mobility Overal bed mobility: Needs Assistance Bed Mobility: Supine to Sit;Sit to Supine     Supine to sit: Mod assist;+2 for safety/equipment Sit to supine: Max assist;+2 for physical assistance   General bed mobility comments: increased time, pt with good initiation for LLE movement to R EOB but needs increased assist to move RLE due to hip pain; good effort to raise trunk with BUE support    Transfers Overall transfer level: Needs assistance Equipment used: Rolling walker (2  wheeled) Transfers: Sit to/from Stand Sit to Stand: Mod assist;+2 safety/equipment;From elevated surface         General transfer comment: from EOB to RW with +43modA, unable to sidestep due to RLE buckling so obtained Stedy, pt able to stand to [place] with +11modA to get to chair. Pt moaning at times but able to tolerate transfer well.  Ambulation/Gait             General Gait Details: Unable, RLE buckling with attempt to sidestep   Stairs             Wheelchair Mobility    Modified Rankin (Stroke Patients Only)       Balance Overall balance assessment: Needs assistance Sitting-balance support: Feet supported;No upper extremity supported Sitting balance-Leahy Scale: Fair Sitting balance - Comments: supervision to min guard for safety, pt sometimes with anterior lean seated EOB   Standing balance support: During functional activity Standing balance-Leahy Scale: Poor Standing balance comment: Requires UE support and external support in standing. Flexed at hips/trunk, able to stand more upright with Stedy support                            Cognition Arousal/Alertness: Awake/alert Behavior During Therapy: WFL for tasks assessed/performed Overall Cognitive Status: Impaired/Different from baseline Area of Impairment: Orientation;Problem solving;Awareness;Memory;Safety/judgement                 Orientation Level: Disoriented to;Time   Memory: Decreased short-term memory Following Commands: Follows one step commands consistently;Follows one step commands with increased time;Follows multi-step commands inconsistently Safety/Judgement: Decreased awareness of safety Awareness: Emergent Problem Solving: Slow  processing;Difficulty sequencing;Requires verbal cues;Requires tactile cues General Comments: Pt follows simple commands with increased time, decreased problem solving; internally distracted by pain, can be impulsive to get up once sitting but does  follow commands to sit and wait for therapist to prepare lines prior to standing.      Exercises General Exercises - Lower Extremity Ankle Circles/Pumps: AROM;Both;10 reps;Supine Long Arc Quad: AROM;Left;10 reps;Seated Heel Slides: AROM;Left;5 reps;Supine    General Comments General comments (skin integrity, edema, etc.): daughter present; SpO2 90-95% on 2L HF [place]; HR to 121 bpm with exertion and 102 bpm seated in chair; BP 139/77 (95) seated in chair      Pertinent Vitals/Pain Pain Assessment: Faces Faces Pain Scale: Hurts even more Pain Location: right pelvis worsened with movement, minimal pain at rest Pain Descriptors / Indicators: Grimacing;Guarding;Discomfort;Moaning Pain Intervention(s): Limited activity within patient's tolerance;Monitored during session;Premedicated before session;Repositioned           PT Goals (current goals can now be found in the care plan section) Acute Rehab PT Goals Patient Stated Goal: improve pain PT Goal Formulation: With patient Time For Goal Achievement: 03/09/21 Potential to Achieve Goals: Fair Progress towards PT goals: Progressing toward goals    Frequency    Min 3X/week      PT Plan Current plan remains appropriate       AM-PAC PT "6 Clicks" Mobility   Outcome Measure  Help needed turning from your back to your side while in a flat bed without using bedrails?: A Lot Help needed moving from lying on your back to sitting on the side of a flat bed without using bedrails?: A Lot Help needed moving to and from a bed to a chair (including a wheelchair)?: Total Help needed standing up from a chair using your arms (e.g., wheelchair or bedside chair)?: A Lot Help needed to walk in hospital room?: Total Help needed climbing 3-5 steps with a railing? : Total 6 Click Score: 9    End of Session Equipment Utilized During Treatment: Gait belt;Oxygen Activity Tolerance: Patient tolerated treatment well Patient left: in chair;with call  bell/phone within reach;with chair alarm set;Other (comment) (heels floated, RN aware, chair alarm on) Nurse Communication: Mobility status;Need for lift equipment (+2 with Stedy to stand pivot) PT Visit Diagnosis: Pain;Muscle weakness (generalized) (M62.81);Difficulty in walking, not elsewhere classified (R26.2);Unsteadiness on feet (R26.81) Pain - Right/Left: Right Pain - part of body:  (pelvis/hip)     Time: 7741-4239 PT Time Calculation (min) (ACUTE ONLY): 24 min  Charges:  $Therapeutic Activity: 23-37 mins                     Triana Coover P., PTA Acute Rehabilitation Services Pager: [phone] Office: [place] 02/25/2021, 5:33 PM

## 2021-02-25 NOTE — Progress Notes (Signed)
  Speech Language Pathology Treatment: Dysphagia  Patient Details Name: Kelli Peterson MRN: [id number] DOB: [birth year] Today's Date: 02/25/2021 Time: 4235-3614 SLP Time Calculation (min) (ACUTE ONLY): 17 min  Assessment / Plan / Recommendation Clinical Impression  Pt does not report any difficulties with swallowing, but her daughter describes coughing with solids > liquids. Although not observed to have overt trouble with solids during trials with SLP today, we did discuss the potential impact that her loosely fitting dentures can have. Pt and daughter are agreeable to downgrade to mechanical soft to accommodate this. SLP noticed coughing more so with thin liquids this afternoon. Although pt tries to use the chin tuck that was recommended to her last admission (with nectar thick liquids), she has difficulty coordinating this with swallowing and often brings her head up too early. This seems to be correlated with her episodes of coughing. SLP provided Mod verbal and tactile cueing to maintain a chin tuck position and just bring the straw up to her mouth. With this, she has mild anterior spillage, but no overt s/s of aspiration. Note that on most recent MBS, aspiration with thin liquids was also silent. Pt's daughter asked questions today about the coughing observed during PO intake, asking how to know if the cough is effective at protecting the airway. I reiterated that there is no way to know at bedside, but that the instrumental testing (MBS) could provide [place] with some of those answers. For now, pt/daughter would like to proceed with oral intake, acknowledging risk with mechanical soft solids and thin liquids.    HPI HPI: Patient is an 87 y/o female who presents with right pelvic fxs (right inferior pubic ramus, junction of right pubic ramus and acetabulum and iliac wing) s/p fall, also noted to have A-fib with RVR and elevated creatinine. CXR-vascular congestion. Pt was observed to be coughing with PO  intake on 5/23 and was made NPO later that night due to lethargy. MBS July 2021 revealed an oropharyngeal dysphagia with aspiration that was silent with thin liquids and inconsistently sensed with nectar thick liquids. Backflow was also noted within the esophagus. Dys 3 diet and nectar thick liquids were recommended with strict use of a chin tuck. PMH includes Barrett's esophagus, cerebellar hemorrhage, HTN, CHF, A-fib, cervical ca, CAD, CKD stage III, DVT, AAA.      SLP Plan  Continue with current plan of care       Recommendations  Diet recommendations: Dysphagia 3 (mechanical soft);Thin liquid Liquids provided via: Cup;Straw Medication Administration: Whole meds with puree Supervision: Patient able to self feed;Full supervision/cueing for compensatory strategies Compensations: Minimize environmental distractions;Slow rate;Small sips/bites Postural Changes and/or Swallow Maneuvers: Seated upright 90 degrees                Oral Care Recommendations: Oral care BID Follow up Recommendations: Skilled Nursing facility SLP Visit Diagnosis: Dysphagia, unspecified (R13.10) Plan: Continue with current plan of care       GO                Osie Bond., M.A. [place] [facility] Pager [phone] Office [phone]  02/25/2021, 3:26 PM

## 2021-02-25 NOTE — Progress Notes (Signed)
PROGRESS NOTE    Kelli Peterson  YJE:[id number] DOB: 05/01/1930 DOA: 02/22/2021 PCP: [name], DO   Chief Complaint  Patient presents with  . Fall  . Hip Pain  Brief Narrative: 87 year old female with multiple comorbidities with A. fib on Eliquis, CAD, CKD stage III AAA, hypertension hyperlipidemia history of DVT chronic diastolic CHF presented from home with unwitnessed fall in the bathroom and was having right hip pain since then.  She has brought to the ED.  She was found to have right pelvic fracture in the x-ray and the right hip CT work-up showed elevated creatinine. Patient was transferred to [facility] seen by trauma surgery orthopedic surgery. She was hypoxic given Lasix and also having A. fib with RVR and was placed on Cardizem drip. Patient was admitted seen by orthopedic and trauma advised nonoperative management.  Patient remained sleepy and lethargic also hypoxic with AKI worsening leukocytosis. Blood cultures urine culture sent placed on empiric antibiotics for possible infection ? Aspiration, UTI seen by speech and cleared for diet.  PT OT advised skilled nursing facility Seen by cardiology switchED to p.o. Cardizem 5/24  Subjective:  Seen and examined this morning.  Patient is alert awake at baseline denies shortness of breath.  Does not feel interested to work with PT but after my extensive counseling she has agreed. Leukocytosis has resolved Afebrile overnight She is still on 3 L nasal cannula.  Assessment & Plan:  Right pelvic fracture (Rt inferior pubic ramus, junction of Rt pubic ramus and acetabulum and iliac wing): Secondary to fall. Seen by trauma surgery, orthopedic-advised nonoperative intervention PT OT WBAT, and follow-up with Dr. [name] in 2 to 3 weeks.Cont  pain control oral and IV opiates-minimize IV opiates due to lethargy/sleepiness.  Continue PT OT and will need a skilled nursing facility  AKI and CKD,stage IIIa: Baseline creatinine ranging  1.1-1.4.Creatinine worsened and placed on IV fluids with improving creatinine, encourage oral intake now 2/2 chd hold off on ivf. Monitor signs of fluid overload. Recent Labs  Lab 02/22/21 1334 02/23/21 0212 02/23/21 1850 02/24/21 0417 02/25/21 0037  BUN 32* 34* 50* 47* 39*  CREATININE 1.57* 1.84* 2.04* 2.03* 1.61*   Hypokalemia replete Recent Labs  Lab 02/22/21 1334 02/23/21 0212 02/23/21 1850 02/24/21 0417 02/25/21 0037  K 4.0 4.8 4.4 3.9 3.4*   Atrial fibrillation with rapid ventricular response:  Cardizem drip cahnged to po short acting Cardizem, appreciate cardiology input, Eliquis switched to Lovenox-consult pharmacy to resume her home regimen. Cardiology following Echo shows EF 55 to 63% grade 3 diastolic dysfunction/restrictive moderately elevated pulmonary artery systolic pressure, RVSP 78.5  Leukocytosis/UTI poa/?Aspiration: Suspecting due to UTI /?  Aspiration.  Pro-Cal elevated 0.7, placed on Unasyn 5/24, follow-up blood culture urine culture. Chest x-ray-enlarged cardiac silhouette with suspected mild pulmonary edema.Previous imaging there is concern for chronic lung disease.   Recent Labs  Lab 02/22/21 1334 02/23/21 0212 02/24/21 0417 02/25/21 0037  WBC 17.9* 15.8* 21.2* 10.5  PROCALCITON  --   --  0.78 0.50   Grade 3 chronic diastolic CHF/x-ray showing mild pulmonary edema/congestion:Previous imaging per cardiology concerning for chronic lung disease will likely need to go home with oxygen.  Needing 3 to nasal cannula currently.  May need to resume as needed Lasix if creatinine remains stable. Cardiology following,bnp  In 220, previously 528. Echocardiogram shows- Grade III Diastolic dysfunction.  Acute hypoxemic respiratory failure/Concern for pulmonary vascular disease versus ILD: Previous imaging likely has chronic lung disease.  At this time she  is needing oxygen.  Volume status stable monitor closely watch for fluid overload may need as needed Lasix, monitoring  creatinine.  Appreciate cardiology input.    Lethargy:Patient has been lethargic/sleepy on and off.But coherent and interactive once awake.  This morning she is much more alert awake and oriented.  Similar episode when she had AKI few years ago per daughter.Minimize narcotics-but will need oral regimen for pain control.She has been nonfocal on exam.    Choking episode with eating she has had similar issues in the past.  SLP cleared for diet.  Anemia hemoglobin has dropped in 9-10 gm.monitor h/h. Recent Labs  Lab 02/22/21 1700 02/22/21 2255 02/23/21 0212 02/24/21 0417 02/25/21 0037  HGB 13.1 13.6 13.0 10.7* 9.5*  HCT 42.1 42.5 40.5 32.7* 29.7*   CAD/HTN/HLD:BP is controlled, hold po meds.Patient on metoprolol Cardizem and Demadex at home currently on Cardizem.  Goals of care: Full code with multiple comorbidities, advanced age and now with recent fall with multiple fractures prognosis does not appear bright. I will consult palliative care to discuss CODE STATUS/GOC -daughter agrees.  Protein-calorie malnutrition severe as below continue to augment diet as tolerated Nutrition Problem: Severe Malnutrition Etiology: chronic illness Signs/Symptoms: energy intake < 75% for > or equal to 1 month,moderate fat depletion,severe fat depletion,moderate muscle depletion,severe muscle depletion,percent weight loss Percent weight loss: 8 % Interventions: Ensure Enlive (each supplement provides 350kcal and 20 grams of protein),Magic cup,MVI  Diet Order            Diet regular Room service appropriate? Yes; Fluid consistency: Thin  Diet effective now               Patient's Body mass index is 21.74 kg/m. DVT prophylaxis: SCDs Start: 02/22/21 1448 SCDs Start: 02/22/21 1448 Code Status:   Code Status: Full Code  Family Communication: plan of care discussed with patient and her daughter was updated extensively at bedside 5/24.  Status is: Inpatient Remains inpatient appropriate because:IV  treatments appropriate due to intensity of illness or inability to take PO and Inpatient level of care appropriate due to severity of illness  Dispo: The patient is from: Home              Anticipated d/c is to: SNF              Patient currently is not medically stable to d/c.   Difficult to place patient No  Unresulted Labs (From admission, onward)          Start     Ordered   02/25/21 0500  Procalcitonin  Daily,   R     Question:  Specimen collection method  Answer:  Lab=Lab collect   02/24/21 0724   02/24/21 1516  Culture, Urine  Add-on,   AD        02/24/21 1516   02/24/21 0727  Culture, blood (Routine X 2) w Reflex to ID Panel  BLOOD CULTURE X 2,   R (with TIMED occurrences)      02/24/21 0727   02/23/21 0500  CBC  Daily,   R     Question:  Specimen collection method  Answer:  Lab=Lab collect   02/22/21 1447   02/23/21 6720  Basic metabolic panel  Daily,   R     Question:  Specimen collection method  Answer:  Lab=Lab collect   02/22/21 1447   Unscheduled  Occult blood card to lab, stool RN will collect  As needed,   R  Question:  Specimen to be collected by:  Answer:  RN will collect   02/24/21 1139          Medications reviewed:  Scheduled Meds: . brimonidine  1 drop Right Eye BID  . diltiazem  60 mg Oral Q6H  . dorzolamide-timolol  1 drop Both Eyes BID  . enoxaparin (LOVENOX) injection  50 mg Subcutaneous Q24H  . feeding supplement  237 mL Oral Q24H  . latanoprost  1 drop Both Eyes Q1200  . mouth rinse  15 mL Mouth Rinse BID   Continuous Infusions: . ampicillin-sulbactam (UNASYN) IV 3 g (02/24/21 2241)    Consultants:see note  Procedures:see note  Antimicrobials: Anti-infectives (From admission, onward)   Start     Dose/Rate Route Frequency Ordered Stop   02/24/21 1100  Ampicillin-Sulbactam (UNASYN) 3 g in sodium chloride 0.9 % 100 mL IVPB        3 g 200 mL/hr over 30 Minutes Intravenous Every 12 hours 02/24/21 0928       Culture/Microbiology     Component Value Date/Time   SDES URINE, RANDOM 04/19/2020 1329   SPECREQUEST NONE 04/19/2020 1329   CULT  04/19/2020 1329    NO GROWTH Performed at [facility] [address] [place], [place] [address]    REPTSTATUS 04/20/2020 FINAL 04/19/2020 1329    Other culture-see note  Objective: Vitals: Today's Vitals   02/25/21 0020 02/25/21 0334 02/25/21 0604 02/25/21 0651  BP:  118/68    Pulse:  80    Resp:  20    Temp:  98.6 F (37 C)    TempSrc:  Oral    SpO2:  96%    Weight:  50.5 kg    Height:      PainSc: Asleep  10-Worst pain ever Asleep    Intake/Output Summary (Last 24 hours) at 02/25/2021 0741 Last data filed at 02/25/2021 0559 Gross per 24 hour  Intake 1813.43 ml  Output 840 ml  Net 973.43 ml   Filed Weights   02/23/21 0358 02/24/21 0336 02/25/21 0334  Weight: 48.4 kg 50.7 kg 50.5 kg   Weight change: -0.2 kg  Intake/Output from previous day: 05/24 0701 - 05/25 0700 In: 1813.4 [I.V.:1613.4; IV Piggyback:200] Out: 840 [Urine:840] Intake/Output this shift: No intake/output data recorded. Filed Weights   02/23/21 0358 02/24/21 0336 02/25/21 0334  Weight: 48.4 kg 50.7 kg 50.5 kg    Examination: General exam: AAO, frail, weak, on [place], older than stated age, weak appearing. HEENT:Oral mucosa moist, Ear/Nose WNL grossly, dentition normal. Respiratory system: bilaterally diminished breath sounds mild basal crackles, no use of accessory muscle Cardiovascular system: S1 & S2 +, No JVD,. Gastrointestinal system: Abdomen soft,NT,ND, BS+ Nervous System:Alert, awake, able to move and wiggle all extremities,has pain on the pelvic fracture. Extremities: no edema, distal peripheral pulses palpable.  Skin: No rashes,no icterus. MSK: Normal muscle bulk,tone, power  Data Reviewed: I have personally reviewed following labs and imaging studies CBC: Recent Labs  Lab 02/22/21 1334 02/22/21 1700 02/22/21 2255 02/23/21 0212 02/24/21 0417 02/25/21 0037  WBC 17.9*   --   --  15.8* 21.2* 10.5  NEUTROABS 16.0*  --   --   --   --   --   HGB 14.1 13.1 13.6 13.0 10.7* 9.5*  HCT 44.5 42.1 42.5 40.5 32.7* 29.7*  MCV 87.9  --   --  86.7 86.7 86.8  PLT 226  --   --  189 144* PLATELET CLUMPS NOTED ON SMEAR, COUNT  APPEARS DECREASED   Basic Metabolic Panel: Recent Labs  Lab 02/22/21 1334 02/23/21 0212 02/23/21 1850 02/24/21 0417 02/25/21 0037  NA 144 141 135 136 137  K 4.0 4.8 4.4 3.9 3.4*  CL 105 107 102 103 106  CO2 27 24 20* 25 24  GLUCOSE 151* 144* 133* 130* 117*  BUN 32* 34* 50* 47* 39*  CREATININE 1.57* 1.84* 2.04* 2.03* 1.61*  CALCIUM 9.2 8.5* 8.3* 8.0* 8.0*   GFR: Estimated Creatinine Clearance: 17.3 mL/min (A) (by C-G formula based on SCr of 1.61 mg/dL (H)). Liver Function Tests: No results for input(s): AST, ALT, ALKPHOS, BILITOT, PROT, ALBUMIN in the last 168 hours. No results for input(s): LIPASE, AMYLASE in the last 168 hours. No results for input(s): AMMONIA in the last 168 hours. Coagulation Profile: No results for input(s): INR, PROTIME in the last 168 hours. Cardiac Enzymes: No results for input(s): CKTOTAL, CKMB, CKMBINDEX, TROPONINI in the last 168 hours. BNP (last 3 results) No results for input(s): PROBNP in the last 8760 hours. HbA1C: No results for input(s): HGBA1C in the last 72 hours. CBG: No results for input(s): GLUCAP in the last 168 hours. Lipid Profile: No results for input(s): CHOL, HDL, LDLCALC, TRIG, CHOLHDL, LDLDIRECT in the last 72 hours. Thyroid Function Tests: Recent Labs    02/25/21 0037  TSH 0.583   Anemia Panel: No results for input(s): VITAMINB12, FOLATE, FERRITIN, TIBC, IRON, RETICCTPCT in the last 72 hours. Sepsis Labs: Recent Labs  Lab 02/24/21 0417 02/25/21 0037  PROCALCITON 0.78 0.50    Recent Results (from the past 240 hour(s))  Resp Panel by RT-PCR (Flu A&B, Covid) Nasopharyngeal Swab     Status: None   Collection Time: 02/22/21  2:50 PM   Specimen: Nasopharyngeal Swab;  Nasopharyngeal(NP) swabs in vial transport medium  Result Value Ref Range Status   SARS Coronavirus 2 by RT PCR NEGATIVE NEGATIVE Final    Comment: (NOTE) SARS-CoV-2 target nucleic acids are NOT DETECTED.  The SARS-CoV-2 RNA is generally detectable in upper respiratory specimens during the acute phase of infection. The lowest concentration of SARS-CoV-2 viral copies this assay can detect is 138 copies/mL. A negative result does not preclude SARS-Cov-2 infection and should not be used as the sole basis for treatment or other patient management decisions. A negative result may occur with  improper specimen collection/handling, submission of specimen other than nasopharyngeal swab, presence of viral mutation(s) within the areas targeted by this assay, and inadequate number of viral copies(<138 copies/mL). A negative result must be combined with clinical observations, patient history, and epidemiological information. The expected result is Negative.  Fact Sheet for Patients:  EntrepreneurPulse.com.au  Fact Sheet for Healthcare Providers:  IncredibleEmployment.be  This test is no t yet approved or cleared by the [place] FDA and  has been authorized for detection and/or diagnosis of SARS-CoV-2 by FDA under an Emergency Use Authorization (EUA). This EUA will remain  in effect (meaning this test can be used) for the duration of the COVID-19 declaration under Section 564(b)(1) of the Act, 21 U.S.C.section 360bbb-3(b)(1), unless the authorization is terminated  or revoked sooner.       Influenza A by PCR NEGATIVE NEGATIVE Final   Influenza B by PCR NEGATIVE NEGATIVE Final    Comment: (NOTE) The Xpert Xpress SARS-CoV-2/FLU/RSV plus assay is intended as an aid in the diagnosis of influenza from Nasopharyngeal swab specimens and should not be used as a sole basis for treatment. Nasal washings and aspirates are unacceptable for Xpert Xpress  SARS-CoV-2/FLU/RSV testing.  Fact Sheet for Patients: EntrepreneurPulse.com.au  Fact Sheet for Healthcare Providers: IncredibleEmployment.be  This test is not yet approved or cleared by the [place] FDA and has been authorized for detection and/or diagnosis of SARS-CoV-2 by FDA under an Emergency Use Authorization (EUA). This EUA will remain in effect (meaning this test can be used) for the duration of the COVID-19 declaration under Section 564(b)(1) of the Act, 21 U.S.C. section 360bbb-3(b)(1), unless the authorization is terminated or revoked.  Performed at [facility], [place] [address], [place], [place] [address]      Radiology Studies: DG Chest Port 1 View  Result Date: 02/23/2021 CLINICAL DATA:  Shortness of breath, history CHF, hypertension, atrial fibrillation EXAM: PORTABLE CHEST 1 VIEW COMPARISON:  Portable exam 1824 hours compared to 02/22/2021 FINDINGS: Kyphotic positioning with artifacts project over LEFT chest. Enlargement of cardiac silhouette with slight vascular congestion. Atherosclerotic calcification aorta. Mild perihilar infiltrates likely pulmonary edema. No pleural effusion or pneumothorax. Marked osseous demineralization. IMPRESSION: Enlargement of cardiac silhouette with suspect mild pulmonary edema. Aortic Atherosclerosis (ICD10-I70.0). Electronically Signed   By: Lavonia Dana M.D.   On: 02/23/2021 18:41   DG Knee Right Port  Result Date: 02/23/2021 CLINICAL DATA:  87 year old with acute knee pain. EXAM: PORTABLE RIGHT KNEE - 1-2 VIEW COMPARISON:  None. FINDINGS: Negative for fracture or dislocation. Extensive osteophytosis in the lateral knee compartment and patellofemoral compartment. Question a small joint effusion. Diffuse vascular calcifications. IMPRESSION: Osteoarthritis in the right knee without acute bone abnormality. Electronically Signed   By: Markus Daft M.D.   On: 02/23/2021 11:54    ECHOCARDIOGRAM COMPLETE  Result Date: 02/24/2021    ECHOCARDIOGRAM REPORT   Patient Name:   Kelli Peterson Date of Exam: 02/24/2021 Medical Rec #:  [id number]        Height:       60.0 in Accession #:    [id number]       Weight:       111.8 lb Date of Birth:  August 08, 1930         BSA:          1.458 m Patient Age:    40 years         BP:           101/67 mmHg Patient Gender: F                HR:           95 bpm. Exam Location:  Inpatient Procedure: 2D Echo, Cardiac Doppler and Color Doppler Indications:    R94.31 Abnormal EKG  History:        Patient has prior history of Echocardiogram examinations, most                 recent 04/16/2020. CHF, Pulmonary HTN, Arrythmias:Atrial                 Fibrillation; Risk Factors:Hypertension and Dyslipidemia. CKD.                 Cancer.  Sonographer:    Jonelle Sidle Dance Referring Phys: [id number] [place] IMPRESSIONS  1. Left ventricular ejection fraction, by estimation, is 55 to 60%. The left ventricle has normal function. The left ventricle has no regional wall motion abnormalities. There is mild left ventricular hypertrophy. Left ventricular diastolic parameters are consistent with Grade III diastolic dysfunction (restrictive).  2. Right ventricular systolic function is normal. The right ventricular size is normal. There is moderately  elevated pulmonary artery systolic pressure. The estimated right ventricular systolic pressure is 71.0 mmHg.  3. Left atrial size was severely dilated.  4. Right atrial size was severely dilated.  5. The mitral valve is normal in structure. Mild mitral valve regurgitation. No evidence of mitral stenosis.  6. Tricuspid valve regurgitation is severe.  7. The aortic valve is normal in structure. There is mild calcification of the aortic valve. There is mild thickening of the aortic valve. Aortic valve regurgitation is not visualized. Mild to moderate aortic valve sclerosis/calcification is present, without any evidence of aortic stenosis.  8. The  inferior vena cava is normal in size with greater than 50% respiratory variability, suggesting right atrial pressure of 3 mmHg. FINDINGS  Left Ventricle: Left ventricular ejection fraction, by estimation, is 55 to 60%. The left ventricle has normal function. The left ventricle has no regional wall motion abnormalities. The left ventricular internal cavity size was normal in size. There is  mild left ventricular hypertrophy. Left ventricular diastolic parameters are consistent with Grade III diastolic dysfunction (restrictive). Right Ventricle: The right ventricular size is normal. No increase in right ventricular wall thickness. Right ventricular systolic function is normal. There is moderately elevated pulmonary artery systolic pressure. The tricuspid regurgitant velocity is 3.43 m/s, and with an assumed right atrial pressure of 8 mmHg, the estimated right ventricular systolic pressure is 62.6 mmHg. Left Atrium: Left atrial size was severely dilated. Right Atrium: Right atrial size was severely dilated. Pericardium: There is no evidence of pericardial effusion. Mitral Valve: The mitral valve is normal in structure. Mild mitral valve regurgitation. No evidence of mitral valve stenosis. Tricuspid Valve: The tricuspid valve is normal in structure. Tricuspid valve regurgitation is severe. No evidence of tricuspid stenosis. Aortic Valve: The aortic valve is normal in structure. There is mild calcification of the aortic valve. There is mild thickening of the aortic valve. Aortic valve regurgitation is not visualized. Mild to moderate aortic valve sclerosis/calcification is present, without any evidence of aortic stenosis. Aortic valve mean gradient measures 3.5 mmHg. Aortic valve peak gradient measures 6.8 mmHg. Aortic valve area, by VTI measures 1.09 cm. Pulmonic Valve: The pulmonic valve was normal in structure. Pulmonic valve regurgitation is not visualized. No evidence of pulmonic stenosis. Aorta: The aortic root is  normal in size and structure. Venous: The inferior vena cava is normal in size with greater than 50% respiratory variability, suggesting right atrial pressure of 3 mmHg. IAS/Shunts: No atrial level shunt detected by color flow Doppler.  LEFT VENTRICLE PLAX 2D LVIDd:         3.70 cm LVIDs:         2.90 cm LV PW:         1.30 cm LV IVS:        1.20 cm LVOT diam:     1.70 cm LV SV:         26 LV SV Index:   18 LVOT Area:     2.27 cm  LV Volumes (MOD) LV vol d, MOD A2C: 30.1 ml LV vol d, MOD A4C: 31.1 ml LV vol s, MOD A2C: 11.1 ml LV vol s, MOD A4C: 13.2 ml LV SV MOD A2C:     19.0 ml LV SV MOD A4C:     31.1 ml LV SV MOD BP:      18.2 ml RIGHT VENTRICLE          IVC RV Basal diam:  3.00 cm  IVC diam: 2.20 cm TAPSE (M-mode):  1.0 cm LEFT ATRIUM           Index       RIGHT ATRIUM           Index LA diam:      4.30 cm 2.95 cm/m  RA Area:     28.10 cm LA Vol (A2C): 89.7 ml 61.54 ml/m RA Volume:   93.50 ml  64.14 ml/m LA Vol (A4C): 52.3 ml 35.88 ml/m  AORTIC VALVE AV Area (Vmax):    1.15 cm AV Area (Vmean):   1.08 cm AV Area (VTI):     1.09 cm AV Vmax:           130.00 cm/s AV Vmean:          87.750 cm/s AV VTI:            0.236 m AV Peak Grad:      6.8 mmHg AV Mean Grad:      3.5 mmHg LVOT Vmax:         66.10 cm/s LVOT Vmean:        41.600 cm/s LVOT VTI:          0.113 m LVOT/AV VTI ratio: 0.48  AORTA Ao Root diam: 2.80 cm Ao Asc diam:  3.00 cm MITRAL VALVE                TRICUSPID VALVE MV Area (PHT): 5.42 cm     TR Peak grad:   47.1 mmHg MV Decel Time: 140 msec     TR Vmax:        343.00 cm/s MV E velocity: 127.00 cm/s MV A velocity: 34.90 cm/s   SHUNTS MV E/A ratio:  3.64         Systemic VTI:  0.11 m                             Systemic Diam: 1.70 cm Candee Furbish MD Electronically signed by Candee Furbish MD Signature Date/Time: 02/24/2021/4:34:31 PM    Final      LOS: 3 days   Antonieta Pert, MD [facility]  02/25/2021, 7:41 AM

## 2021-02-25 NOTE — Progress Notes (Signed)
Physical Therapy Treatment Patient Details Name: Kelli Peterson MRN: [id number] DOB: 01-22-1930 Today's Date: 02/25/2021    History of Present Illness Patient is a 87 y/o female who presents with right pelvic fxs (right inferior pubic ramus, junction of right pubic ramus and acetabulum and iliac wing) s/p fall, also noted to have A-fib with RVR and elevated creatinine. CXR-vascular congestion. PMH includes HTN, CHF, A-fib, cervical ca, CAD, CKD stage III, DVT, AAA.    PT Comments    PTA called back into room to assist pt and nursing staff with transfer back from chair>bed, pt with increased pain during this session but with good participation as able. Pt performed reclined/seated LLE therapeutic exercises with good tolerance as detailed below and needed up to +2 maxA for standing and bed mobility due to increased pain/fatigue but pt put forth good effort with all mobility tasks. Pt continues to benefit from PT services to progress toward functional mobility goals. Plan to continue standing/transfer training next date. Continue to recommend SNF.   Follow Up Recommendations  SNF;Supervision for mobility/OOB     Equipment Recommendations  None recommended by PT    Recommendations for Other Services       Precautions / Restrictions Precautions Precautions: Fall;Other (comment) Precaution Comments: watch 02, premedicate Restrictions Weight Bearing Restrictions: Yes RLE Weight Bearing: Weight bearing as tolerated Other Position/Activity Restrictions: WBAT BLEs    Mobility  Bed Mobility Overal bed mobility: Needs Assistance Bed Mobility: Sit to Supine     Supine to sit: Mod assist;+2 for safety/equipment Sit to supine: Max assist;+2 for physical assistance   General bed mobility comments: +2 assist (trunk and BLE) via helicopter method to return to bed due to pt increased pain/fatigue during second session    Transfers Overall transfer level: Needs assistance Equipment used:  Rolling walker (2 wheeled) Transfers: Sit to/from Stand Sit to Stand: Mod assist;+2 safety/equipment;From elevated surface;Max assist         General transfer comment: from recliner chair to [facility], with +45maxA then from high Stedy seat to EOB with +59modA, pt moaning at times but able to tolerate transfer well.  Ambulation/Gait             General Gait Details: Unable, RLE buckling with attempt to sidestep earlier in day   Stairs             Wheelchair Mobility    Modified Rankin (Stroke Patients Only)       Balance Overall balance assessment: Needs assistance Sitting-balance support: Feet supported;No upper extremity supported Sitting balance-Leahy Scale: Fair Sitting balance - Comments: min guard for safety, pt with anterior lean seated EOB   Standing balance support: During functional activity Standing balance-Leahy Scale: Poor Standing balance comment: Requires UE support and external support in standing. Flexed at hips/trunk, able to stand more upright with Stedy support                            Cognition Arousal/Alertness: Awake/alert Behavior During Therapy: WFL for tasks assessed/performed Overall Cognitive Status: Impaired/Different from baseline Area of Impairment: Orientation;Problem solving;Awareness;Memory;Safety/judgement                 Orientation Level: Disoriented to;Time   Memory: Decreased short-term memory Following Commands: Follows one step commands consistently;Follows one step commands with increased time;Follows multi-step commands inconsistently Safety/Judgement: Decreased awareness of safety Awareness: Emergent Problem Solving: Slow processing;Difficulty sequencing;Requires verbal cues;Requires tactile cues General Comments: pt having more pain during  session but responds well to cues for pursed-lip breathing and when encouraged to "yell a little" to get pain out, she does so and seems to have less pain when  mobilizing afterward with this strategy. Pain meds given during session.      Exercises General Exercises - Lower Extremity Ankle Circles/Pumps: AROM;Both;10 reps;Supine Long Arc Quad: AROM;Left;10 reps;Seated (2 reps on RLE, too painful to attempt more) Heel Slides: AROM;Left;5 reps;Supine Hip ABduction/ADduction: AAROM;Left;10 reps (reclined in chair) Hip Flexion/Marching: AROM;Left;10 reps;Seated (increased pain reported, did not attempt on R side)    General Comments General comments (skin integrity, edema, etc.): daughter present; HR to 110's bpm with return transfer and SpO2 WNL on 2L HF ; reviewed floating heels with pt and LE HEP      Pertinent Vitals/Pain Pain Assessment: Faces Faces Pain Scale: Hurts even more Pain Location: right pelvis worsened with movement, minimal pain at rest Pain Descriptors / Indicators: Grimacing;Guarding;Discomfort;Moaning Pain Intervention(s): Limited activity within patient's tolerance;Monitored during session;Repositioned;RN gave pain meds during session    Home Living                      Prior Function            PT Goals (current goals can now be found in the care plan section) Acute Rehab PT Goals Patient Stated Goal: improve pain PT Goal Formulation: With patient Time For Goal Achievement: 03/09/21 Potential to Achieve Goals: Fair Progress towards PT goals: Progressing toward goals    Frequency    Min 3X/week      PT Plan Current plan remains appropriate    Co-evaluation              AM-PAC PT "6 Clicks" Mobility   Outcome Measure  Help needed turning from your back to your side while in a flat bed without using bedrails?: A Lot Help needed moving from lying on your back to sitting on the side of a flat bed without using bedrails?: A Lot Help needed moving to and from a bed to a chair (including a wheelchair)?: Total Help needed standing up from a chair using your arms (e.g., wheelchair or bedside  chair)?: A Lot Help needed to walk in hospital room?: Total Help needed climbing 3-5 steps with a railing? : Total 6 Click Score: 9    End of Session Equipment Utilized During Treatment: Gait belt;Oxygen Activity Tolerance: Patient tolerated treatment well;Patient limited by pain Patient left: with call bell/phone within reach;Other (comment);in bed;with bed alarm set;with nursing/sitter in room;with family/visitor present (heels floated, NT in room to assist pt with bath) Nurse Communication: Mobility status;Need for lift equipment (+2 with Stedy to stand pivot) PT Visit Diagnosis: Pain;Muscle weakness (generalized) (M62.81);Difficulty in walking, not elsewhere classified (R26.2);Unsteadiness on feet (R26.81) Pain - Right/Left: Right Pain - part of body:  (pelvis/hip)     Time: 6283-1517 PT Time Calculation (min) (ACUTE ONLY): 29 min  Charges:  $Therapeutic Exercise: 8-22 mins $Therapeutic Activity: 8-22 mins                     Jasmond River P., PTA Acute Rehabilitation Services Pager: [phone] Office: [place] 02/25/2021, 5:43 PM

## 2021-02-25 NOTE — Progress Notes (Signed)
ANTICOAGULATION CONSULT NOTE - Follow Up Consult  Pharmacy Consult for Eliquis Indication: atrial fibrillation  Allergies  Allergen Reactions  . Penicillins Swelling and Other (See Comments)    Ankles and feet swell  . Zocor [Simvastatin] Other (See Comments)    Weak and dizzy  . Nsaids Other (See Comments)    Tylenol is all that is permitted    Patient Measurements: Height: 5' (152.4 cm) Weight: 50.5 kg (111 lb 5.3 oz) IBW/kg (Calculated) : 45.5  Vital Signs: Temp: 97.4 F (36.3 C) (05/25 0809) Temp Source: Oral (05/25 0809) BP: 134/95 (05/25 0809) Pulse Rate: 101 (05/25 0809)  Labs: Recent Labs    02/23/21 0212 02/23/21 1850 02/24/21 0417 02/25/21 0037  HGB 13.0  --  10.7* 9.5*  HCT 40.5  --  32.7* 29.7*  PLT 189  --  144* PLATELET CLUMPS NOTED ON SMEAR, COUNT APPEARS DECREASED  CREATININE 1.84* 2.04* 2.03* 1.61*    Estimated Creatinine Clearance: 17.3 mL/min (A) (by C-G formula based on SCr of 1.61 mg/dL (H)).    Assessment: 87 year old female admitted s/p fall, found to have pelvic fracture. She was on apixaban PTA (2.5mg  po bid) and this has been on hold (lovenox started 5/25 with last dose ~ 10am).  -hg= 9.5 -SCr= 1.6, wt= 50kg  Goal of Therapy:   Monitor platelets by anticoagulation protocol: Yes   Plan:  -Resume apixaban 2.5mg  po bid -Will sign off. Please contact pharmacy with any other needs.  Thank you Hildred Laser, PharmD Clinical Pharmacist **Pharmacist phone directory can now be found on [email or web address] ([email or web address]).  Listed under [place].

## 2021-02-25 NOTE — Plan of Care (Signed)
  Problem: Clinical Measurements: Goal: Diagnostic test results will improve Outcome: Progressing Goal: Respiratory complications will improve Outcome: Progressing Goal: Cardiovascular complication will be avoided Outcome: Progressing   

## 2021-02-25 NOTE — Consult Note (Signed)
Consultation Note Date: 02/25/2021   Patient Name: Kelli Peterson  DOB: 14-Oct-1929  MRN: [id number]  Age / Sex: 87 y.o., female  PCP: [name], DO Referring Physician: Antonieta Pert, MD  Reason for Consultation: Establishing goals of care and Psychosocial/spiritual support  HPI/Patient Profile: 87 y.o. female with a PMH of multiple comorbidities with A. fib on Eliquis, CAD, CKD stage III, AAA, hypertension hyperlipidemia, history of DVT, chronic diastolic CHF. She was admitted on 02/22/2021 after an unwitnessed fall in the bathroom and was having right hip pain since then.  She has brought to the ED and found to have right pelvic fracture in the x-ray and the right hip CT; work-up showed elevated creatinine.  Patient was transferred to [facility] seen by trauma surgery orthopedic surgery.  She was hypoxic given Lasix and also having A. fib with RVR and was placed on Cardizem drip. Seen by cardiology switched to p.o. Cardizem 5/24  Patient was admitted seen by orthopedic and trauma advised nonoperative management.  Patient remained sleepy and lethargic also hypoxic with AKI worsening leukocytosis. Blood cultures urine culture sent placed on empiric antibiotics for possible infection: ? Aspiration, UTI. She was seen by speech and cleared for diet.  PT/OT advised skilled nursing facility.  Patient and family face treatment option decisions, advanced directive decisions and anticipatory care needs.   Clinical Assessment and Goals of Care:  This NP Wadie Lessen along with Walden Field NP reviewed medical records, received report from team, assessed the patient and then meet at the patient's bedside along with her daughter/ Debbe Odea to discuss diagnosis, prognosis, [place], EOL wishes disposition and options.   Concept of Palliative Care was introduced as specialized medical care for people and their families living with  serious illness.  If focuses on providing relief from the symptoms and stress of a serious illness.  The goal is to improve quality of life for both the patient and the family. Values and goals of care important to patient and family were attempted to be elicited.  Created space and opportunity for patient and family to explore thoughts and feelings regarding current medical situation.  At baseline the patient was able to sit/sleep in a recliner, feed herself, get up to the bathroom, and give herself a sponge bath.  Attempted some therapy, but the patient "does not want to move around".  She does have poor hydration but a good appetite.   A  discussion was had today regarding advanced directives. Concepts specific to code status, artifical feeding and hydration, continued IV antibiotics and rehospitalization was had.  The difference between a aggressive medical intervention path  and a palliative comfort care path for this patient at this time was had.     MOST form was reviewed and completed with the patient's HCPOA [name]) and marked for scanning. [place] document completed in [place].     Questions and concerns addressed.  Patient encouraged to call with questions or concerns.     PMT will continue to support holistically.  Decision Maker: PATIENT (with support from HCPOA)    SUMMARY OF RECOMMENDATIONS    Code Status/Advance Care Planning:  DNR-documented today     Symptom Management:   Scheduled Tylenol 1000mg  tid  Continue prn pain medication with Oxycodone  Palliative Prophylaxis:   Aspiration, Frequent Pain Assessment, Oral Care and Turn Reposition  Additional Recommendations (Limitations, Scope, Preferences):  No Artificial Feeding  Prognosis:   Unable to determine  Discharge Planning: To Be Determined: Hopeful for SNF/rehab,  acknowledges the patient may not be a candidate for this.      Primary Diagnoses: Present on Admission: . Fall . Acute hypoxemic  respiratory failure ([place]) . Atrial fibrillation with rapid ventricular response ([place]) . CKD (chronic kidney disease), stage III ([place])   I have reviewed the medical record, interviewed the patient and family, and examined the patient. The following aspects are pertinent.  Past Medical History:  Diagnosis Date  . Adenomatous colon polyp 2007  . Barrett's esophagus 2007  . 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 Cervical cancer ([place])   . Chronic diastolic CHF (congestive heart failure) ([place])   . CKD (chronic kidney disease), stage II   . Closed pelvic fracture ([place])   . DVT (deep venous thrombosis) ([place])   . Fibrocystic breast disease   . Fracture of pubic ramus ([place])   . Hyperlipidemia   . Hypertension   . Iron deficiency anemia   . Macular degeneration   . Permanent atrial fibrillation ([place])   . Pulmonary hypertension ([place])   . Tricuspid regurgitation    Social History   Socioeconomic History  . Marital status: Widowed    Spouse name: Not on file  . Number of children: Not on file  . Years of education: Not on file  . Highest education level: Not on file  Occupational History  . Occupation: Retired  Tobacco Use  . Smoking status: Never Smoker  . Smokeless tobacco: Never Used  Substance and Sexual Activity  . Alcohol use: No  . Drug use: No  . Sexual activity: Not on file  Other Topics Concern  . Not on file  Social History Narrative  . Not on file   Social Determinants of Health   Financial Resource Strain: Not on file  Food Insecurity: Not on file  Transportation Needs: Not on file  Physical Activity: Not on file  Stress: Not on file  Social Connections: Not on file   Family History  Problem Relation Age of Onset  . CVA Father   . CVA Mother    Scheduled Meds: . apixaban  2.5 mg Oral BID  . brimonidine  1 drop Right Eye BID  . diltiazem  240 mg Oral Daily  . dorzolamide-timolol  1 drop Both Eyes BID  . feeding supplement  237 mL Oral Q24H  . latanoprost  1  drop Both Eyes Q1200  . mouth rinse  15 mL Mouth Rinse BID  . metoprolol succinate  50 mg Oral QHS   Continuous Infusions: . ampicillin-sulbactam (UNASYN) IV 3 g (02/25/21 1209)   PRN Meds:.acetaminophen, HYDROcodone-acetaminophen Medications Prior to Admission:  Prior to Admission medications   Medication Sig Start Date End Date Taking? Authorizing Provider  acetaminophen (TYLENOL) 500 MG tablet Take 500 mg by mouth every 6 (six) hours as needed for pain.   Yes [provider]  apixaban (ELIQUIS) 2.5 MG TABS tablet Take 2.5 mg by mouth 2 (two) times daily.   Yes [provider]  atorvastatin (LIPITOR) 20 MG tablet Take 20 mg by mouth daily.   Yes [provider]  brimonidine (ALPHAGAN) 0.2 % ophthalmic solution Place 1 drop into the right eye 2 (two) times daily. 05/01/18  Yes [provider]  denosumab (PROLIA) 60 MG/ML SOSY injection Inject 60 mg into the skin every 6 (six) months.   Yes [provider]  diltiazem (CARDIZEM CD) 240 MG 24 hr capsule Take 1 capsule (240 mg total) by mouth daily. 04/25/20 05/25/20 Yes Jonnie Finner,  DO  dorzolamide-timolol (COSOPT) 22.3-6.8 MG/ML ophthalmic solution Place 1 drop into both eyes 2 (two) times daily. 02/12/21  Yes [provider]  latanoprost (XALATAN) 0.005 % ophthalmic solution Place 1 drop into both eyes every evening.   Yes [provider]  metoprolol succinate (TOPROL-XL) 25 MG 24 hr tablet Take 3 tablets (75 mg total) by mouth daily. 04/25/20 05/25/20 Yes Kyle, Tyrone A, DO  Multiple Vitamin (MULTIVITAMIN WITH MINERALS) TABS tablet Take 1 tablet by mouth daily.   Yes [provider]  torsemide (DEMADEX) 20 MG tablet Take 1 tablet (20 mg total) by mouth 2 (two) times daily. Patient taking differently: Take 20-40 mg by mouth See admin instructions. Take 1 tablet by mouth all days EXCEPT on Mon, Wed, and Fri take 1 additional tablet to equal 40mg  04/24/20 05/24/20 Yes Kyle, Tyrone  A, DO  potassium chloride (KLOR-CON) 10 MEQ tablet Take 1 tablet (10 mEq total) by mouth daily. Patient not taking: Reported on 02/22/2021 04/24/20 05/24/20  Cherylann Ratel A, DO   Allergies  Allergen Reactions  . Penicillins Swelling and Other (See Comments)    Ankles and feet swell  . Zocor [Simvastatin] Other (See Comments)    Weak and dizzy  . Nsaids Other (See Comments)    Tylenol is all that is permitted   Review of Systems  Constitutional: Positive for activity change and fatigue.  HENT: Positive for hearing loss.   Musculoskeletal:       Significant right hip pain    Physical Exam Vitals and nursing note reviewed.  Constitutional:      General: She is not in acute distress.    Appearance: She is not toxic-appearing.  Musculoskeletal:     Right hip: Tenderness present. Decreased range of motion.  Neurological:     Mental Status: She is alert.  Psychiatric:        Mood and Affect: Mood normal.        Behavior: Behavior normal.     Vital Signs: BP 112/68 (BP Location: Right Arm)   Pulse 89   Temp 97.8 F (36.6 C) (Oral)   Resp 20   Ht 5' (1.524 m)   Wt 50.5 kg   SpO2 97%   BMI 21.74 kg/m  Pain Scale: 0-10 POSS *See Group Information*: S-Acceptable,Sleep, easy to arouse Pain Score: Asleep   SpO2: SpO2: 97 % O2 Device:SpO2: 97 % O2 Flow Rate: .O2 Flow Rate (L/min): 3 L/min  IO: Intake/output summary:   Intake/Output Summary (Last 24 hours) at 02/25/2021 1243 Last data filed at 02/25/2021 0559 Gross per 24 hour  Intake 1813.43 ml  Output 700 ml  Net 1113.43 ml    LBM: Last BM Date: 02/24/21 Baseline Weight: Weight: 48.4 kg Most recent weight: Weight: 50.5 kg     Palliative Assessment/Data: 40%   Discussed with Dr Lupita Leash  Time In: 1230 Time Out: 1:45 Time Total: 75 min Greater than 50%  of this time was spent counseling and coordinating care related to the above assessment and plan.  Signed by: Wadie Lessen, NP   Please contact Palliative Medicine  Team phone at [phone] for questions and concerns.  For individual provider: See Shea Evans

## 2021-02-25 NOTE — Progress Notes (Signed)
Progress Note  Patient Name: Kelli Peterson Date of Encounter: 02/25/2021  [facility] Cardiologist: Fransico Him, MD   Subjective   No acute events overnight. Daughter at bedside. Asking when she can do physical therapy. Breathing stable, no chest pain.  Inpatient Medications    Scheduled Meds: . brimonidine  1 drop Right Eye BID  . diltiazem  240 mg Oral Daily  . dorzolamide-timolol  1 drop Both Eyes BID  . enoxaparin (LOVENOX) injection  50 mg Subcutaneous Q24H  . feeding supplement  237 mL Oral Q24H  . latanoprost  1 drop Both Eyes Q1200  . mouth rinse  15 mL Mouth Rinse BID  . metoprolol succinate  50 mg Oral QHS   Continuous Infusions: . ampicillin-sulbactam (UNASYN) IV 3 g (02/24/21 2241)  . potassium chloride 10 mEq (02/25/21 0950)   PRN Meds: acetaminophen, HYDROcodone-acetaminophen   Vital Signs    Vitals:   02/24/21 2038 02/24/21 2331 02/25/21 0334 02/25/21 0809  BP: 122/64 104/62 118/68 (!) 134/95  Pulse: 93 96 80 (!) 101  Resp: 17 20 20 20   Temp: 98.4 F (36.9 C) 98.6 F (37 C) 98.6 F (37 C) (!) 97.4 F (36.3 C)  TempSrc: Oral Oral Oral Oral  SpO2: 97% 94% 96% 98%  Weight:   50.5 kg   Height:        Intake/Output Summary (Last 24 hours) at 02/25/2021 1048 Last data filed at 02/25/2021 0559 Gross per 24 hour  Intake 1813.43 ml  Output 840 ml  Net 973.43 ml   Last 3 Weights 02/25/2021 02/24/2021 02/23/2021  Weight (lbs) 111 lb 5.3 oz 111 lb 12.4 oz 106 lb 11.2 oz  Weight (kg) 50.5 kg 50.7 kg 48.4 kg      Telemetry    Atrial fibrillation, rates 90-100s - Personally Reviewed  ECG    No new since 02/22/21 - Personally Reviewed  Physical Exam   GEN: No acute distress.  Frail elderly woman. Berkley in place. Neck: No JVD but prominent TR wave Cardiac: irregularly irregular S1 and S2, no rubs, or gallops. Difficult to hear any murmurs over coarse breath sounds Respiratory: Coarse diffusely GI: Soft, nontender, non-distended  MS: No edema;  No deformity. Neuro:  Nonfocal  Psych: Normal affect   Labs    High Sensitivity Troponin:  No results for input(s): TROPONINIHS in the last 720 hours.    Chemistry Recent Labs  Lab 02/23/21 1850 02/24/21 0417 02/25/21 0037  NA 135 136 137  K 4.4 3.9 3.4*  CL 102 103 106  CO2 20* 25 24  GLUCOSE 133* 130* 117*  BUN 50* 47* 39*  CREATININE 2.04* 2.03* 1.61*  CALCIUM 8.3* 8.0* 8.0*  GFRNONAA 23* 23* 31*  ANIONGAP 13 8 7      Hematology Recent Labs  Lab 02/23/21 0212 02/24/21 0417 02/25/21 0037  WBC 15.8* 21.2* 10.5  RBC 4.67 3.77* 3.42*  HGB 13.0 10.7* 9.5*  HCT 40.5 32.7* 29.7*  MCV 86.7 86.7 86.8  MCH 27.8 28.4 27.8  MCHC 32.1 32.7 32.0  RDW 17.2* 17.2* 17.1*  PLT 189 144* PLATELET CLUMPS NOTED ON SMEAR, COUNT APPEARS DECREASED    BNP Recent Labs  Lab 02/23/21 2130 02/25/21 0037  BNP 220.2* 264.3*     DDimer No results for input(s): DDIMER in the last 168 hours.   Radiology    DG Chest Port 1 View  Result Date: 02/23/2021 CLINICAL DATA:  Shortness of breath, history CHF, hypertension, atrial fibrillation EXAM: PORTABLE CHEST 1  VIEW COMPARISON:  Portable exam 1824 hours compared to 02/22/2021 FINDINGS: Kyphotic positioning with artifacts project over LEFT chest. Enlargement of cardiac silhouette with slight vascular congestion. Atherosclerotic calcification aorta. Mild perihilar infiltrates likely pulmonary edema. No pleural effusion or pneumothorax. Marked osseous demineralization. IMPRESSION: Enlargement of cardiac silhouette with suspect mild pulmonary edema. Aortic Atherosclerosis (ICD10-I70.0). Electronically Signed   By: Lavonia Dana M.D.   On: 02/23/2021 18:41   ECHOCARDIOGRAM COMPLETE  Result Date: 02/24/2021    ECHOCARDIOGRAM REPORT   Patient Name:   Kelli Peterson Date of Exam: 02/24/2021 Medical Rec #:  [id number]        Height:       60.0 in Accession #:    [id number]       Weight:       111.8 lb Date of Birth:  1930-08-05         BSA:          1.458  m Patient Age:    87 years         BP:           101/67 mmHg Patient Gender: F                HR:           95 bpm. Exam Location:  Inpatient Procedure: 2D Echo, Cardiac Doppler and Color Doppler Indications:    R94.31 Abnormal EKG  History:        Patient has prior history of Echocardiogram examinations, most                 recent 04/16/2020. CHF, Pulmonary HTN, Arrythmias:Atrial                 Fibrillation; Risk Factors:Hypertension and Dyslipidemia. CKD.                 Cancer.  Sonographer:    Jonelle Sidle Dance Referring Phys: [id number] Kerens Pacolet IMPRESSIONS  1. Left ventricular ejection fraction, by estimation, is 55 to 60%. The left ventricle has normal function. The left ventricle has no regional wall motion abnormalities. There is mild left ventricular hypertrophy. Left ventricular diastolic parameters are consistent with Grade III diastolic dysfunction (restrictive).  2. Right ventricular systolic function is normal. The right ventricular size is normal. There is moderately elevated pulmonary artery systolic pressure. The estimated right ventricular systolic pressure is 63.8 mmHg.  3. Left atrial size was severely dilated.  4. Right atrial size was severely dilated.  5. The mitral valve is normal in structure. Mild mitral valve regurgitation. No evidence of mitral stenosis.  6. Tricuspid valve regurgitation is severe.  7. The aortic valve is normal in structure. There is mild calcification of the aortic valve. There is mild thickening of the aortic valve. Aortic valve regurgitation is not visualized. Mild to moderate aortic valve sclerosis/calcification is present, without any evidence of aortic stenosis.  8. The inferior vena cava is normal in size with greater than 50% respiratory variability, suggesting right atrial pressure of 3 mmHg. FINDINGS  Left Ventricle: Left ventricular ejection fraction, by estimation, is 55 to 60%. The left ventricle has normal function. The left ventricle has no regional wall  motion abnormalities. The left ventricular internal cavity size was normal in size. There is  mild left ventricular hypertrophy. Left ventricular diastolic parameters are consistent with Grade III diastolic dysfunction (restrictive). Right Ventricle: The right ventricular size is normal. No increase in right ventricular wall thickness. Right ventricular systolic function is normal. There  is moderately elevated pulmonary artery systolic pressure. The tricuspid regurgitant velocity is 3.43 m/s, and with an assumed right atrial pressure of 8 mmHg, the estimated right ventricular systolic pressure is 52.8 mmHg. Left Atrium: Left atrial size was severely dilated. Right Atrium: Right atrial size was severely dilated. Pericardium: There is no evidence of pericardial effusion. Mitral Valve: The mitral valve is normal in structure. Mild mitral valve regurgitation. No evidence of mitral valve stenosis. Tricuspid Valve: The tricuspid valve is normal in structure. Tricuspid valve regurgitation is severe. No evidence of tricuspid stenosis. Aortic Valve: The aortic valve is normal in structure. There is mild calcification of the aortic valve. There is mild thickening of the aortic valve. Aortic valve regurgitation is not visualized. Mild to moderate aortic valve sclerosis/calcification is present, without any evidence of aortic stenosis. Aortic valve mean gradient measures 3.5 mmHg. Aortic valve peak gradient measures 6.8 mmHg. Aortic valve area, by VTI measures 1.09 cm. Pulmonic Valve: The pulmonic valve was normal in structure. Pulmonic valve regurgitation is not visualized. No evidence of pulmonic stenosis. Aorta: The aortic root is normal in size and structure. Venous: The inferior vena cava is normal in size with greater than 50% respiratory variability, suggesting right atrial pressure of 3 mmHg. IAS/Shunts: No atrial level shunt detected by color flow Doppler.  LEFT VENTRICLE PLAX 2D LVIDd:         3.70 cm LVIDs:          2.90 cm LV PW:         1.30 cm LV IVS:        1.20 cm LVOT diam:     1.70 cm LV SV:         26 LV SV Index:   18 LVOT Area:     2.27 cm  LV Volumes (MOD) LV vol d, MOD A2C: 30.1 ml LV vol d, MOD A4C: 31.1 ml LV vol s, MOD A2C: 11.1 ml LV vol s, MOD A4C: 13.2 ml LV SV MOD A2C:     19.0 ml LV SV MOD A4C:     31.1 ml LV SV MOD BP:      18.2 ml RIGHT VENTRICLE          IVC RV Basal diam:  3.00 cm  IVC diam: 2.20 cm TAPSE (M-mode): 1.0 cm LEFT ATRIUM           Index       RIGHT ATRIUM           Index LA diam:      4.30 cm 2.95 cm/m  RA Area:     28.10 cm LA Vol (A2C): 89.7 ml 61.54 ml/m RA Volume:   93.50 ml  64.14 ml/m LA Vol (A4C): 52.3 ml 35.88 ml/m  AORTIC VALVE AV Area (Vmax):    1.15 cm AV Area (Vmean):   1.08 cm AV Area (VTI):     1.09 cm AV Vmax:           130.00 cm/s AV Vmean:          87.750 cm/s AV VTI:            0.236 m AV Peak Grad:      6.8 mmHg AV Mean Grad:      3.5 mmHg LVOT Vmax:         66.10 cm/s LVOT Vmean:        41.600 cm/s LVOT VTI:          0.113 m LVOT/AV VTI  ratio: 0.48  AORTA Ao Root diam: 2.80 cm Ao Asc diam:  3.00 cm MITRAL VALVE                TRICUSPID VALVE MV Area (PHT): 5.42 cm     TR Peak grad:   47.1 mmHg MV Decel Time: 140 msec     TR Vmax:        343.00 cm/s MV E velocity: 127.00 cm/s MV A velocity: 34.90 cm/s   SHUNTS MV E/A ratio:  3.64         Systemic VTI:  0.11 m                             Systemic Diam: 1.70 cm Candee Furbish MD Electronically signed by Candee Furbish MD Signature Date/Time: 02/24/2021/4:34:31 PM    Final     Cardiac Studies   Echo 02/24/21 1. Left ventricular ejection fraction, by estimation, is 55 to 60%. The  left ventricle has normal function. The left ventricle has no regional  wall motion abnormalities. There is mild left ventricular hypertrophy.  Left ventricular diastolic parameters  are consistent with Grade III diastolic dysfunction (restrictive).  2. Right ventricular systolic function is normal. The right ventricular  size is  normal. There is moderately elevated pulmonary artery systolic  pressure. The estimated right ventricular systolic pressure is 85.4 mmHg.  3. Left atrial size was severely dilated.  4. Right atrial size was severely dilated.  5. The mitral valve is normal in structure. Mild mitral valve  regurgitation. No evidence of mitral stenosis.  6. Tricuspid valve regurgitation is severe.  7. The aortic valve is normal in structure. There is mild calcification  of the aortic valve. There is mild thickening of the aortic valve. Aortic  valve regurgitation is not visualized. Mild to moderate aortic valve  sclerosis/calcification is present,  without any evidence of aortic stenosis.  8. The inferior vena cava is normal in size with greater than 50%  respiratory variability, suggesting right atrial pressure of 3 mmHg.   Patient Profile     87 y.o. 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 Lupita Leash.  Assessment & Plan    Permanent atrial fibrillation, initially with RVR -transitioned back to oral rate control agents from diltiazem drip -with restrictive filling (see below), want to avoid bradycardia. Baseline Hrs now around 90, will consolidate diltiazem to 240 mg daily. Will start back home metoprolol succinate this evening, though will start at lower dose (50 mg) than home dose to avoid bradycardia. -CHA2DS2/VAS Stroke Risk Points=7, restart anticoagulation as below  Mechanical fall with pelvic fractures: -restart oral anticoagulation if no plans for surgery, appreciate pharmacy assistance -if falls recur, may need to discuss holding long term anticoagulation, but with  chadsvasc of 7 she is very high risk of stroke, so would not stop unless absolutely needed.  Chronic diastolic dysfunction, grade 3 Severe tricuspid regurgitation Severe biatrial enlargement Moderately elevated pulmonary pressures -difficult situation. Appears euvolemic on exam, RA pressure normal on echo, has chronic lung changes (as described in note 02/24/21) but likely also component of restrictive filling -avoid bradycardia -blood pressure has been in a good range -may need PRN  diuretic, but need to be cautious given chronic kidney disease stage 3a -Right atrial pressure normal (3 mmHg) despite elevated PA pressure, BNP actually below prior, procalcitonin elevated. This favors euvolemia. Defer antibiotics to primary team.   For questions or updates, please contact [place] Please consult [email or web address] for contact info under        Signed, Buford Dresser, MD  02/25/2021, 10:48 AM

## 2021-02-26 DIAGNOSIS — N1831 Chronic kidney disease, stage 3a: Secondary | ICD-10-CM | POA: Diagnosis not present

## 2021-02-26 DIAGNOSIS — Z7901 Long term (current) use of anticoagulants: Secondary | ICD-10-CM | POA: Diagnosis not present

## 2021-02-26 DIAGNOSIS — I5189 Other ill-defined heart diseases: Secondary | ICD-10-CM | POA: Diagnosis not present

## 2021-02-26 DIAGNOSIS — S72009A Fracture of unspecified part of neck of unspecified femur, initial encounter for closed fracture: Secondary | ICD-10-CM | POA: Diagnosis not present

## 2021-02-26 DIAGNOSIS — I4891 Unspecified atrial fibrillation: Secondary | ICD-10-CM | POA: Diagnosis not present

## 2021-02-26 LAB — CBC
HCT: 31.3 % — ABNORMAL LOW (ref 36.0–46.0)
Hemoglobin: 10 g/dL — ABNORMAL LOW (ref 12.0–15.0)
MCH: 27.9 pg (ref 26.0–34.0)
MCHC: 31.9 g/dL (ref 30.0–36.0)
MCV: 87.4 fL (ref 80.0–100.0)
Platelets: 123 10*3/uL — ABNORMAL LOW (ref 150–400)
RBC: 3.58 MIL/uL — ABNORMAL LOW (ref 3.87–5.11)
RDW: 17 % — ABNORMAL HIGH (ref 11.5–15.5)
WBC: 11.7 10*3/uL — ABNORMAL HIGH (ref 4.0–10.5)
nRBC: 0 % (ref 0.0–0.2)

## 2021-02-26 LAB — BASIC METABOLIC PANEL
Anion gap: 9 (ref 5–15)
BUN: 28 mg/dL — ABNORMAL HIGH (ref 8–23)
CO2: 23 mmol/L (ref 22–32)
Calcium: 8.3 mg/dL — ABNORMAL LOW (ref 8.9–10.3)
Chloride: 105 mmol/L (ref 98–111)
Creatinine, Ser: 1.16 mg/dL — ABNORMAL HIGH (ref 0.44–1.00)
GFR, Estimated: 45 mL/min — ABNORMAL LOW (ref >60)
Glucose, Bld: 116 mg/dL — ABNORMAL HIGH (ref 70–99)
Potassium: 4.1 mmol/L (ref 3.5–5.1)
Sodium: 137 mmol/L (ref 135–145)

## 2021-02-26 MED ORDER — FUROSEMIDE 10 MG/ML IJ SOLN
40.0000 mg | Freq: Once | INTRAMUSCULAR | Status: AC
  Administered 2021-02-26: 40 mg via INTRAVENOUS
  Filled 2021-02-26: qty 4

## 2021-02-26 NOTE — Progress Notes (Signed)
Progress Note  Patient Name: Kelli Peterson Date of Encounter: 02/26/2021  [facility] Cardiologist: Fransico Him, MD   Subjective   No acute events overnight. No new concerns today. No chest pain, breathing is stable. Main issue has been pain control so she can tolerate physical therapy.  Inpatient Medications    Scheduled Meds: . acetaminophen  1,000 mg Oral TID  . apixaban  2.5 mg Oral BID  . brimonidine  1 drop Right Eye BID  . diltiazem  240 mg Oral Daily  . dorzolamide-timolol  1 drop Both Eyes BID  . feeding supplement  237 mL Oral Q24H  . furosemide  40 mg Intravenous Once  . latanoprost  1 drop Both Eyes Q1200  . mouth rinse  15 mL Mouth Rinse BID  . metoprolol succinate  50 mg Oral QHS   Continuous Infusions: . ampicillin-sulbactam (UNASYN) IV 3 g (02/25/21 2215)   PRN Meds: oxyCODONE   Vital Signs    Vitals:   02/25/21 1126 02/25/21 2000 02/25/21 2338 02/26/21 0751  BP: 112/68 (!) 141/78 (!) 157/97 107/65  Pulse: 89 95 97 98  Resp: 20 20 20 17   Temp: 97.8 F (36.6 C) (!) 97.3 F (36.3 C) 98 F (36.7 C) 97.9 F (36.6 C)  TempSrc: Oral Oral Oral Oral  SpO2: 97% 94% 90% 95%  Weight:      Height:        Intake/Output Summary (Last 24 hours) at 02/26/2021 1031 Last data filed at 02/26/2021 0756 Gross per 24 hour  Intake 320 ml  Output --  Net 320 ml   Last 3 Weights 02/25/2021 02/24/2021 02/23/2021  Weight (lbs) 111 lb 5.3 oz 111 lb 12.4 oz 106 lb 11.2 oz  Weight (kg) 50.5 kg 50.7 kg 48.4 kg      Telemetry    Atrial fibrillation, rates 90s - Personally Reviewed  ECG    No new since 02/22/21 - Personally Reviewed  Physical Exam   GEN: frail elderly woman, [place] in place, no acute distress NECK: JVD just above clavicle at 45 degree, plus TR wave CARDIAC: irregularly irregular rhythm, normal S1 and S2, no rubs or gallops. No murmur appreciated over coarse breath sounds. VASCULAR: Radial pulses 2+ bilaterally.  RESPIRATORY:  Coarse  diffusely, with anterior rhonchi today ABDOMEN: Soft, non-tender, non-distended MUSCULOSKELETAL:  Moves all 4 limbs independently SKIN: Warm and dry, no significant edema NEUROLOGIC:  No focal neuro deficits noted. PSYCHIATRIC:  Normal affect   Labs    High Sensitivity Troponin:  No results for input(s): TROPONINIHS in the last 720 hours.    Chemistry Recent Labs  Lab 02/24/21 0417 02/25/21 0037 02/26/21 0052  NA 136 137 137  K 3.9 3.4* 4.1  CL 103 106 105  CO2 25 24 23   GLUCOSE 130* 117* 116*  BUN 47* 39* 28*  CREATININE 2.03* 1.61* 1.16*  CALCIUM 8.0* 8.0* 8.3*  GFRNONAA 23* 31* 45*  ANIONGAP 8 7 9      Hematology Recent Labs  Lab 02/24/21 0417 02/25/21 0037 02/26/21 0052  WBC 21.2* 10.5 11.7*  RBC 3.77* 3.42* 3.58*  HGB 10.7* 9.5* 10.0*  HCT 32.7* 29.7* 31.3*  MCV 86.7 86.8 87.4  MCH 28.4 27.8 27.9  MCHC 32.7 32.0 31.9  RDW 17.2* 17.1* 17.0*  PLT 144* PLATELET CLUMPS NOTED ON SMEAR, COUNT APPEARS DECREASED 123*    BNP Recent Labs  Lab 02/23/21 2130 02/25/21 0037  BNP 220.2* 264.3*     DDimer No results for input(s):  DDIMER in the last 168 hours.   Radiology    ECHOCARDIOGRAM COMPLETE  Result Date: 02/24/2021    ECHOCARDIOGRAM REPORT   Patient Name:   Kelli Peterson Date of Exam: 02/24/2021 Medical Rec #:  [id number]        Height:       60.0 in Accession #:    [id number]       Weight:       111.8 lb Date of Birth:  1930/08/06         BSA:          1.458 m Patient Age:    87 years         BP:           101/67 mmHg Patient Gender: F                HR:           95 bpm. Exam Location:  Inpatient Procedure: 2D Echo, Cardiac Doppler and Color Doppler Indications:    R94.31 Abnormal EKG  History:        Patient has prior history of Echocardiogram examinations, most                 recent 04/16/2020. CHF, Pulmonary HTN, Arrythmias:Atrial                 Fibrillation; Risk Factors:Hypertension and Dyslipidemia. CKD.                 Cancer.  Sonographer:    Jonelle Sidle  Dance Referring Phys: [id number] [place] IMPRESSIONS  1. Left ventricular ejection fraction, by estimation, is 55 to 60%. The left ventricle has normal function. The left ventricle has no regional wall motion abnormalities. There is mild left ventricular hypertrophy. Left ventricular diastolic parameters are consistent with Grade III diastolic dysfunction (restrictive).  2. Right ventricular systolic function is normal. The right ventricular size is normal. There is moderately elevated pulmonary artery systolic pressure. The estimated right ventricular systolic pressure is 27.7 mmHg.  3. Left atrial size was severely dilated.  4. Right atrial size was severely dilated.  5. The mitral valve is normal in structure. Mild mitral valve regurgitation. No evidence of mitral stenosis.  6. Tricuspid valve regurgitation is severe.  7. The aortic valve is normal in structure. There is mild calcification of the aortic valve. There is mild thickening of the aortic valve. Aortic valve regurgitation is not visualized. Mild to moderate aortic valve sclerosis/calcification is present, without any evidence of aortic stenosis.  8. The inferior vena cava is normal in size with greater than 50% respiratory variability, suggesting right atrial pressure of 3 mmHg. FINDINGS  Left Ventricle: Left ventricular ejection fraction, by estimation, is 55 to 60%. The left ventricle has normal function. The left ventricle has no regional wall motion abnormalities. The left ventricular internal cavity size was normal in size. There is  mild left ventricular hypertrophy. Left ventricular diastolic parameters are consistent with Grade III diastolic dysfunction (restrictive). Right Ventricle: The right ventricular size is normal. No increase in right ventricular wall thickness. Right ventricular systolic function is normal. There is moderately elevated pulmonary artery systolic pressure. The tricuspid regurgitant velocity is 3.43 m/s, and with an assumed  right atrial pressure of 8 mmHg, the estimated right ventricular systolic pressure is 41.2 mmHg. Left Atrium: Left atrial size was severely dilated. Right Atrium: Right atrial size was severely dilated. Pericardium: There is no evidence of pericardial effusion. Mitral Valve: The mitral  valve is normal in structure. Mild mitral valve regurgitation. No evidence of mitral valve stenosis. Tricuspid Valve: The tricuspid valve is normal in structure. Tricuspid valve regurgitation is severe. No evidence of tricuspid stenosis. Aortic Valve: The aortic valve is normal in structure. There is mild calcification of the aortic valve. There is mild thickening of the aortic valve. Aortic valve regurgitation is not visualized. Mild to moderate aortic valve sclerosis/calcification is present, without any evidence of aortic stenosis. Aortic valve mean gradient measures 3.5 mmHg. Aortic valve peak gradient measures 6.8 mmHg. Aortic valve area, by VTI measures 1.09 cm. Pulmonic Valve: The pulmonic valve was normal in structure. Pulmonic valve regurgitation is not visualized. No evidence of pulmonic stenosis. Aorta: The aortic root is normal in size and structure. Venous: The inferior vena cava is normal in size with greater than 50% respiratory variability, suggesting right atrial pressure of 3 mmHg. IAS/Shunts: No atrial level shunt detected by color flow Doppler.  LEFT VENTRICLE PLAX 2D LVIDd:         3.70 cm LVIDs:         2.90 cm LV PW:         1.30 cm LV IVS:        1.20 cm LVOT diam:     1.70 cm LV SV:         26 LV SV Index:   18 LVOT Area:     2.27 cm  LV Volumes (MOD) LV vol d, MOD A2C: 30.1 ml LV vol d, MOD A4C: 31.1 ml LV vol s, MOD A2C: 11.1 ml LV vol s, MOD A4C: 13.2 ml LV SV MOD A2C:     19.0 ml LV SV MOD A4C:     31.1 ml LV SV MOD BP:      18.2 ml RIGHT VENTRICLE          IVC RV Basal diam:  3.00 cm  IVC diam: 2.20 cm TAPSE (M-mode): 1.0 cm LEFT ATRIUM           Index       RIGHT ATRIUM           Index LA diam:       4.30 cm 2.95 cm/m  RA Area:     28.10 cm LA Vol (A2C): 89.7 ml 61.54 ml/m RA Volume:   93.50 ml  64.14 ml/m LA Vol (A4C): 52.3 ml 35.88 ml/m  AORTIC VALVE AV Area (Vmax):    1.15 cm AV Area (Vmean):   1.08 cm AV Area (VTI):     1.09 cm AV Vmax:           130.00 cm/s AV Vmean:          87.750 cm/s AV VTI:            0.236 m AV Peak Grad:      6.8 mmHg AV Mean Grad:      3.5 mmHg LVOT Vmax:         66.10 cm/s LVOT Vmean:        41.600 cm/s LVOT VTI:          0.113 m LVOT/AV VTI ratio: 0.48  AORTA Ao Root diam: 2.80 cm Ao Asc diam:  3.00 cm MITRAL VALVE                TRICUSPID VALVE MV Area (PHT): 5.42 cm     TR Peak grad:   47.1 mmHg MV Decel Time: 140 msec     TR Vmax:  343.00 cm/s MV E velocity: 127.00 cm/s MV A velocity: 34.90 cm/s   SHUNTS MV E/A ratio:  3.64         Systemic VTI:  0.11 m                             Systemic Diam: 1.70 cm Candee Furbish MD Electronically signed by Candee Furbish MD Signature Date/Time: 02/24/2021/4:34:31 PM    Final     Cardiac Studies   Echo 02/24/21 1. Left ventricular ejection fraction, by estimation, is 55 to 60%. The  left ventricle has normal function. The left ventricle has no regional  wall motion abnormalities. There is mild left ventricular hypertrophy.  Left ventricular diastolic parameters  are consistent with Grade III diastolic dysfunction (restrictive).  2. Right ventricular systolic function is normal. The right ventricular  size is normal. There is moderately elevated pulmonary artery systolic  pressure. The estimated right ventricular systolic pressure is 62.1 mmHg.  3. Left atrial size was severely dilated.  4. Right atrial size was severely dilated.  5. The mitral valve is normal in structure. Mild mitral valve  regurgitation. No evidence of mitral stenosis.  6. Tricuspid valve regurgitation is severe.  7. The aortic valve is normal in structure. There is mild calcification  of the aortic valve. There is mild thickening of the  aortic valve. Aortic  valve regurgitation is not visualized. Mild to moderate aortic valve  sclerosis/calcification is present,  without any evidence of aortic stenosis.  8. The inferior vena cava is normal in size with greater than 50%  respiratory variability, suggesting right atrial pressure of 3 mmHg.   Patient Profile     87 y.o. 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 Lupita Leash.  Assessment & Plan    Permanent atrial fibrillation, initially with RVR -transitioned back to oral rate control agents from diltiazem drip -with restrictive filling (see below), want to avoid bradycardia.  -tolerating diltiazem 240 mg daily, metoprolol succinate 50 mg daily.  -daughter says heart rates around 90 are her norm -CHA2DS2/VAS Stroke Risk Points=7, restarted anticoagulation as below  Mechanical fall with pelvic fractures: -has restarted oral anticoagulation, apixaban 2.5 mg BID given age, weight (Cr now improved) -if falls recur, may need to discuss holding long term anticoagulation, but with chadsvasc of 7 she is very high risk of stroke, so would not stop unless absolutely needed.  Chronic diastolic dysfunction, grade 3 Severe tricuspid regurgitation Severe biatrial enlargement Moderately elevated pulmonary pressures -JVD slightly up today, more rhonchorous. Will give one time dose of 40 mg lasix IV -avoid bradycardia -blood pressure has been in a good range -may need PRN diuretic, but need to be cautious given chronic kidney disease stage 3a -renal function has improved with holding diuretics, from peak Cr of 2.04 to 1.16 today, giving one time lasix today as  noted.  Appreciate palliative care conversation this admission.  [facility] will sign off.   Medication Recommendations:   Apixaban 2.5 mg BID Diltiazem 240 mg daily Metoprolol succinate 50 mg at bedtime Will likely need furosemide 40 mg daily PRN for weight gain/worsening breathing Other recommendations (labs, testing, etc):  none Follow up as  an outpatient:  I offered for follow up with our group. She states that her PCP has managed her heart for a long time and would prefer to keep it that way. We will give her our contact information and would be happy to see if desired.   For questions or updates, please contact [place] Please consult [email or web address] for contact info under        Signed, Buford Dresser, MD  02/26/2021, 10:31 AM

## 2021-02-26 NOTE — Care Management Important Message (Signed)
Important Message  Patient Details  Name: Kelli Peterson MRN: [id number] Date of Birth: 09-Apr-1930   Medicare Important Message Given:  Yes     Shelda Altes 02/26/2021, 10:41 AM

## 2021-02-26 NOTE — Progress Notes (Signed)
PROGRESS NOTE    Kelli Peterson  YTK:[id number] DOB: 06-07-1930 DOA: 02/22/2021 PCP: [name], DO   Chief Complaint  Patient presents with  . Fall  . Hip Pain  Brief Narrative: 87 year old female with multiple comorbidities with A. fib on Eliquis, CAD, CKD stage III AAA, hypertension hyperlipidemia history of DVT chronic diastolic CHF presented from home with unwitnessed fall in the bathroom and was having right hip pain since then.  She has brought to the ED.  She was found to have right pelvic fracture in the x-ray and the right hip CT work-up showed elevated creatinine. Patient was transferred to [facility] seen by trauma surgery orthopedic surgery. She was hypoxic given Lasix and also having A. fib with RVR and was placed on Cardizem drip. Patient was admitted seen by orthopedic and trauma advised nonoperative management.  Patient remained sleepy and lethargic also hypoxic with AKI worsening leukocytosis. Blood cultures urine culture sent placed on empiric antibiotics for possible infection ? Aspiration, UTI seen by speech and cleared for diet.  PT OT advised skilled nursing facility Seen by cardiology switched to p.o. Cardizem 5/24 Patient has been more alert oriented, still needing oxygen, leukocyte has resolved, creatinine improving.  She is followed by palliative care Goc discussion initiated and changed to DNR, MOST form completed.  Working with PT OT  Subjective: Seen and examined this morning, daughter at the bedside Patient was lethargic after oxycodone daughter would like to just stick to Tylenol Remains on 3 to nasal cannula.  She is afebrile.  Leukocytosis slightly up. Renal function improving.  Assessment & Plan:  Right pelvic fracture (Rt inferior pubic ramus, junction of Rt pubic ramus and acetabulum and iliac wing): Secondary to fall. Seen by trauma surgery, orthopedic-advised nonoperative intervention PT OT WBAT, and follow-up with Dr. [name] in 2 to 3 weeks.Cont   pain control -with the scheduled Tylenol, as needed oxygen minimize narcotics if possible due to lethargy.Continue PT OT, plan for skilled nursing facility.    AKI and CKD,stage IIIa: Baseline creatinine ranging 1.1-1.4.Creatinine worsened and placed on IV fluids with creatinine significantly better encourage oral hydration off IV fluids.   Recent Labs  Lab 02/23/21 0212 02/23/21 1850 02/24/21 0417 02/25/21 0037 02/26/21 0052  BUN 34* 50* 47* 39* 28*  CREATININE 1.84* 2.04* 2.03* 1.61* 1.16*   Hypokalemia replete Recent Labs  Lab 02/23/21 0212 02/23/21 1850 02/24/21 0417 02/25/21 0037 02/26/21 0052  K 4.8 4.4 3.9 3.4* 4.1   Atrial fibrillation with rapid ventricular response:  Cardizem drip changed to oral Cardizem to 40 mg, metoprolol 50 mg bedtime also added by cardiology appreciate input.  Continue Eliquis 2.5 mg twice daily home dose. Echo shows EF 55 to 55% grade 3 diastolic dysfunction/restrictive moderately elevated pulmonary artery systolic pressure, RVSP 73.2  Leukocytosis/UTI poa/?Aspiration: Suspecting due to UTI /?  Aspiration.  Pro-Cal elevated 0.7, placed on Unasyn 5/24, blood culture negative so far, follow-up urine cultures, clinically significantly improved.  Remains febrile.  Encourage incentive spirometry ambulation. Chest x-ray-enlarged cardiac silhouette with suspected mild pulmonary edema.Previous imaging on previous admission there was concern for chronic lung disease.   Recent Labs  Lab 02/22/21 1334 02/23/21 0212 02/24/21 0417 02/25/21 0037 02/26/21 0052  WBC 17.9* 15.8* 21.2* 10.5 11.7*  PROCALCITON  --   --  0.78 0.50  --    Grade 3 chronic diastolic CHF/x-ray showing mild pulmonary edema/congestion:Previous imaging per cardiology concerning for chronic lung disease will likely need to go home with oxygen.  Needing  3 L Justin  currently.  May need to resume as needed Lasix if creatinine remains stable.  Cardiology following and appreciate input.   Echocardiogram shows grade 3 diastolic dysfunction with normal EF. bnp  In 220, previously 528.  Acute hypoxemic respiratory failure/Concern for pulmonary vascular disease versus ILD: Previous imaging likely has chronic lung disease.  At this time she is needing oxygen.  Volume status stable monitor closely watch for fluid overload may need as needed Lasix, monitoring creatinine.  Appreciate cardiology input.    Lethargy:Patient has been lethargic/sleepy on and off.But coherent and interactive once awake.  This morning she is much more alert awake and oriented.  Similar episode when she had AKI few years ago per daughter.Minimize narcotics-but will need oral regimen for pain control.She has been nonfocal on exam.    Choking episode with eating she has had similar issues in the past.  SLP cleared for diet.-Now changed to dysphagia 3 diet.  Feed with assistance.  Anemia hemoglobin  AT 10 GM, STABLE Recent Labs  Lab 02/22/21 2255 02/23/21 0212 02/24/21 0417 02/25/21 0037 02/26/21 0052  HGB 13.6 13.0 10.7* 9.5* 10.0*  HCT 42.5 40.5 32.7* 29.7* 31.3*   CAD/HTN/HLD:BP is stable continue Cardizem and metoprolol.  Holding Demadex.    Goals of care: Palliative care following, changed to DNR.  Overall prognosis is not bright  Protein-calorie malnutrition severe as below continue to augment diet as tolerated Nutrition Problem: Severe Malnutrition Etiology: chronic illness Signs/Symptoms: energy intake < 75% for > or equal to 1 month,moderate fat depletion,severe fat depletion,moderate muscle depletion,severe muscle depletion,percent weight loss Percent weight loss: 8 % Interventions: Ensure Enlive (each supplement provides 350kcal and 20 grams of protein),Magic cup,MVI  Diet Order            DIET DYS 3 Room service appropriate? Yes; Fluid consistency: Thin  Diet effective now               Patient's Body mass index is 21.74 kg/m. DVT prophylaxis: apixaban (ELIQUIS) tablet 2.5 mg Start:  02/25/21 1215 SCDs Start: 02/22/21 1448 SCDs Start: 02/22/21 1448 Code Status:   Code Status: DNR  Family Communication: plan of care discussed with patient and her daughter was updated extensively at bedside 5/24, palliative care following closely.  Status is: Inpatient Remains inpatient appropriate because:IV treatments appropriate due to intensity of illness or inability to take PO and Inpatient level of care appropriate due to severity of illness  Dispo: The patient is from: Home              Anticipated d/c is to: SNF              Patient currently is not medically stable to d/c.   Difficult to place patient No  Unresulted Labs (From admission, onward)          Start     Ordered   02/24/21 1516  Culture, Urine  Add-on,   AD        02/24/21 1516   02/23/21 0500  CBC  Daily,   R     Question:  Specimen collection method  Answer:  Lab=Lab collect   02/22/21 1447   02/23/21 5852  Basic metabolic panel  Daily,   R     Question:  Specimen collection method  Answer:  Lab=Lab collect   02/22/21 1447   Unscheduled  Occult blood card to lab, stool RN will collect  As needed,   R     Question:  Specimen to be collected by:  Answer:  RN will collect   02/24/21 1139          Medications reviewed:  Scheduled Meds: . acetaminophen  1,000 mg Oral TID  . apixaban  2.5 mg Oral BID  . brimonidine  1 drop Right Eye BID  . diltiazem  240 mg Oral Daily  . dorzolamide-timolol  1 drop Both Eyes BID  . feeding supplement  237 mL Oral Q24H  . latanoprost  1 drop Both Eyes Q1200  . mouth rinse  15 mL Mouth Rinse BID  . metoprolol succinate  50 mg Oral QHS   Continuous Infusions: . ampicillin-sulbactam (UNASYN) IV 3 g (02/25/21 2215)    Consultants:see note  Procedures:see note  Antimicrobials: Anti-infectives (From admission, onward)   Start     Dose/Rate Route Frequency Ordered Stop   02/24/21 1100  Ampicillin-Sulbactam (UNASYN) 3 g in sodium chloride 0.9 % 100 mL IVPB        3  g 200 mL/hr over 30 Minutes Intravenous Every 12 hours 02/24/21 0928       Culture/Microbiology    Component Value Date/Time   SDES BLOOD RIGHT HAND 02/24/2021 0847   SDES BLOOD RIGHT HAND 02/24/2021 0847   SPECREQUEST  02/24/2021 0847    BOTTLES DRAWN AEROBIC AND ANAEROBIC Blood Culture adequate volume   SPECREQUEST  02/24/2021 0847    BOTTLES DRAWN AEROBIC AND ANAEROBIC Blood Culture adequate volume   CULT  02/24/2021 0847    NO GROWTH < 24 HOURS Performed at [facility] [address], [place], [place] [address]    CULT  02/24/2021 0847    NO GROWTH < 24 HOURS Performed at [facility], [place] [address] [place], [place] [address]    REPTSTATUS PENDING 02/24/2021 0847   REPTSTATUS PENDING 02/24/2021 0847    Other culture-see note  Objective: Vitals: Today's Vitals   02/25/21 2000 02/25/21 2100 02/25/21 2338 02/26/21 0751  BP: (!) 141/78  (!) 157/97 107/65  Pulse: 95  97 98  Resp: 20  20 17   Temp: (!) 97.3 F (36.3 C)  98 F (36.7 C) 97.9 F (36.6 C)  TempSrc: Oral  Oral Oral  SpO2: 94%  90% 95%  Weight:      Height:      PainSc:  0-No pain      Intake/Output Summary (Last 24 hours) at 02/26/2021 0813 Last data filed at 02/26/2021 0756 Gross per 24 hour  Intake 320 ml  Output --  Net 320 ml   Filed Weights   02/23/21 0358 02/24/21 0336 02/25/21 0334  Weight: 48.4 kg 50.7 kg 50.5 kg   Weight change:   Intake/Output from previous day: 05/25 0701 - 05/26 0700 In: 200 [IV Piggyback:200] Out: -  Intake/Output this shift: Total I/O In: 120 [P.O.:120] Out: -  Filed Weights   02/23/21 0358 02/24/21 0336 02/25/21 0334  Weight: 48.4 kg 50.7 kg 50.5 kg    Examination: General exam: Mildly lethargic able to wake up and interact, on nasal cannula HEENT:Oral mucosa moist, Ear/Nose WNL grossly, dentition normal. Respiratory system: bilaterally mild crackles at the base,, no use of accessory muscle Cardiovascular system: S1 & S2 +, No  JVD,. Gastrointestinal system: Abdomen soft,NT,ND, BS+ Nervous System:Alert, awake, mildly sleepy , moving extremities and grossly nonfocal Extremities: no edema, distal peripheral pulses palpable.  Skin: No rashes,no icterus. MSK: Normal muscle bulk,tone, power  Data Reviewed: I have personally reviewed following labs and imaging studies CBC:  Recent Labs  Lab 02/22/21 1334 02/22/21 1700 02/22/21 2255 02/23/21 0212 02/24/21 0417 02/25/21 0037 02/26/21 0052  WBC 17.9*  --   --  15.8* 21.2* 10.5 11.7*  NEUTROABS 16.0*  --   --   --   --   --   --   HGB 14.1   < > 13.6 13.0 10.7* 9.5* 10.0*  HCT 44.5   < > 42.5 40.5 32.7* 29.7* 31.3*  MCV 87.9  --   --  86.7 86.7 86.8 87.4  PLT 226  --   --  189 144* PLATELET CLUMPS NOTED ON SMEAR, COUNT APPEARS DECREASED 123*   < > = values in this interval not displayed.   Basic Metabolic Panel: Recent Labs  Lab 02/23/21 0212 02/23/21 1850 02/24/21 0417 02/25/21 0037 02/26/21 0052  NA 141 135 136 137 137  K 4.8 4.4 3.9 3.4* 4.1  CL 107 102 103 106 105  CO2 24 20* 25 24 23   GLUCOSE 144* 133* 130* 117* 116*  BUN 34* 50* 47* 39* 28*  CREATININE 1.84* 2.04* 2.03* 1.61* 1.16*  CALCIUM 8.5* 8.3* 8.0* 8.0* 8.3*   GFR: Estimated Creatinine Clearance: 24.1 mL/min (A) (by C-G formula based on SCr of 1.16 mg/dL (H)). Liver Function Tests: No results for input(s): AST, ALT, ALKPHOS, BILITOT, PROT, ALBUMIN in the last 168 hours. No results for input(s): LIPASE, AMYLASE in the last 168 hours. No results for input(s): AMMONIA in the last 168 hours. Coagulation Profile: No results for input(s): INR, PROTIME in the last 168 hours. Cardiac Enzymes: No results for input(s): CKTOTAL, CKMB, CKMBINDEX, TROPONINI in the last 168 hours. BNP (last 3 results) No results for input(s): PROBNP in the last 8760 hours. HbA1C: No results for input(s): HGBA1C in the last 72 hours. CBG: No results for input(s): GLUCAP in the last 168 hours. Lipid Profile: No  results for input(s): CHOL, HDL, LDLCALC, TRIG, CHOLHDL, LDLDIRECT in the last 72 hours. Thyroid Function Tests: Recent Labs    02/25/21 0037  TSH 0.583   Anemia Panel: No results for input(s): VITAMINB12, FOLATE, FERRITIN, TIBC, IRON, RETICCTPCT in the last 72 hours. Sepsis Labs: Recent Labs  Lab 02/24/21 0417 02/25/21 0037  PROCALCITON 0.78 0.50    Recent Results (from the past 240 hour(s))  Resp Panel by RT-PCR (Flu A&B, Covid) Nasopharyngeal Swab     Status: None   Collection Time: 02/22/21  2:50 PM   Specimen: Nasopharyngeal Swab; Nasopharyngeal(NP) swabs in vial transport medium  Result Value Ref Range Status   SARS Coronavirus 2 by RT PCR NEGATIVE NEGATIVE Final    Comment: (NOTE) SARS-CoV-2 target nucleic acids are NOT DETECTED.  The SARS-CoV-2 RNA is generally detectable in upper respiratory specimens during the acute phase of infection. The lowest concentration of SARS-CoV-2 viral copies this assay can detect is 138 copies/mL. A negative result does not preclude SARS-Cov-2 infection and should not be used as the sole basis for treatment or other patient management decisions. A negative result may occur with  improper specimen collection/handling, submission of specimen other than nasopharyngeal swab, presence of viral mutation(s) within the areas targeted by this assay, and inadequate number of viral copies(<138 copies/mL). A negative result must be combined with clinical observations, patient history, and epidemiological information. The expected result is Negative.  Fact Sheet for Patients:  EntrepreneurPulse.com.au  Fact Sheet for Healthcare Providers:  IncredibleEmployment.be  This test is no t yet approved or cleared by the [place] and  has been authorized for  detection and/or diagnosis of SARS-CoV-2 by FDA under an Emergency Use Authorization (EUA). This EUA will remain  in effect (meaning this test can be  used) for the duration of the COVID-19 declaration under Section 564(b)(1) of the Act, 21 U.S.C.section 360bbb-3(b)(1), unless the authorization is terminated  or revoked sooner.       Influenza A by PCR NEGATIVE NEGATIVE Final   Influenza B by PCR NEGATIVE NEGATIVE Final    Comment: (NOTE) The Xpert Xpress SARS-CoV-2/FLU/RSV plus assay is intended as an aid in the diagnosis of influenza from Nasopharyngeal swab specimens and should not be used as a sole basis for treatment. Nasal washings and aspirates are unacceptable for Xpert Xpress SARS-CoV-2/FLU/RSV testing.  Fact Sheet for Patients: EntrepreneurPulse.com.au  Fact Sheet for Healthcare Providers: IncredibleEmployment.be  This test is not yet approved or cleared by the [place] FDA and has been authorized for detection and/or diagnosis of SARS-CoV-2 by FDA under an Emergency Use Authorization (EUA). This EUA will remain in effect (meaning this test can be used) for the duration of the COVID-19 declaration under Section 564(b)(1) of the Act, 21 U.S.C. section 360bbb-3(b)(1), unless the authorization is terminated or revoked.  Performed at [facility], [place] [address], [place], [place] [address]   Culture, blood (Routine X 2) w Reflex to ID Panel     Status: None (Preliminary result)   Collection Time: 02/24/21  8:47 AM   Specimen: BLOOD RIGHT HAND  Result Value Ref Range Status   Specimen Description BLOOD RIGHT HAND  Final   Special Requests   Final    BOTTLES DRAWN AEROBIC AND ANAEROBIC Blood Culture adequate volume   Culture   Final    NO GROWTH < 24 HOURS Performed at [facility], [place] [address] [place],  [address]    Report Status PENDING  Incomplete  Culture, blood (Routine X 2) w Reflex to ID Panel     Status: None (Preliminary result)   Collection Time: 02/24/21  8:47 AM   Specimen: BLOOD RIGHT HAND  Result Value Ref Range Status    Specimen Description BLOOD RIGHT HAND  Final   Special Requests   Final    BOTTLES DRAWN AEROBIC AND ANAEROBIC Blood Culture adequate volume   Culture   Final    NO GROWTH < 24 HOURS Performed at [facility], [place] [address] [place],  [address]    Report Status PENDING  Incomplete     Radiology Studies: ECHOCARDIOGRAM COMPLETE  Result Date: 02/24/2021    ECHOCARDIOGRAM REPORT   Patient Name:   AMARIAH KIERSTEAD Date of Exam: 02/24/2021 Medical Rec #:  [id number]        Height:       60.0 in Accession #:    [id number]       Weight:       111.8 lb Date of Birth:  01/12/1930         BSA:          1.458 m Patient Age:    30 years         BP:           101/67 mmHg Patient Gender: F                HR:           95 bpm. Exam Location:  Inpatient Procedure: 2D Echo, Cardiac Doppler and Color Doppler Indications:    R94.31 Abnormal EKG  History:  Patient has prior history of Echocardiogram examinations, most                 recent 04/16/2020. CHF, Pulmonary HTN, Arrythmias:Atrial                 Fibrillation; Risk Factors:Hypertension and Dyslipidemia. CKD.                 Cancer.  Sonographer:    Jonelle Sidle Dance Referring Phys: [id number] Olancha Heimdal IMPRESSIONS  1. Left ventricular ejection fraction, by estimation, is 55 to 60%. The left ventricle has normal function. The left ventricle has no regional wall motion abnormalities. There is mild left ventricular hypertrophy. Left ventricular diastolic parameters are consistent with Grade III diastolic dysfunction (restrictive).  2. Right ventricular systolic function is normal. The right ventricular size is normal. There is moderately elevated pulmonary artery systolic pressure. The estimated right ventricular systolic pressure is 88.8 mmHg.  3. Left atrial size was severely dilated.  4. Right atrial size was severely dilated.  5. The mitral valve is normal in structure. Mild mitral valve regurgitation. No evidence of mitral stenosis.  6. Tricuspid valve  regurgitation is severe.  7. The aortic valve is normal in structure. There is mild calcification of the aortic valve. There is mild thickening of the aortic valve. Aortic valve regurgitation is not visualized. Mild to moderate aortic valve sclerosis/calcification is present, without any evidence of aortic stenosis.  8. The inferior vena cava is normal in size with greater than 50% respiratory variability, suggesting right atrial pressure of 3 mmHg. FINDINGS  Left Ventricle: Left ventricular ejection fraction, by estimation, is 55 to 60%. The left ventricle has normal function. The left ventricle has no regional wall motion abnormalities. The left ventricular internal cavity size was normal in size. There is  mild left ventricular hypertrophy. Left ventricular diastolic parameters are consistent with Grade III diastolic dysfunction (restrictive). Right Ventricle: The right ventricular size is normal. No increase in right ventricular wall thickness. Right ventricular systolic function is normal. There is moderately elevated pulmonary artery systolic pressure. The tricuspid regurgitant velocity is 3.43 m/s, and with an assumed right atrial pressure of 8 mmHg, the estimated right ventricular systolic pressure is 28.0 mmHg. Left Atrium: Left atrial size was severely dilated. Right Atrium: Right atrial size was severely dilated. Pericardium: There is no evidence of pericardial effusion. Mitral Valve: The mitral valve is normal in structure. Mild mitral valve regurgitation. No evidence of mitral valve stenosis. Tricuspid Valve: The tricuspid valve is normal in structure. Tricuspid valve regurgitation is severe. No evidence of tricuspid stenosis. Aortic Valve: The aortic valve is normal in structure. There is mild calcification of the aortic valve. There is mild thickening of the aortic valve. Aortic valve regurgitation is not visualized. Mild to moderate aortic valve sclerosis/calcification is present, without any  evidence of aortic stenosis. Aortic valve mean gradient measures 3.5 mmHg. Aortic valve peak gradient measures 6.8 mmHg. Aortic valve area, by VTI measures 1.09 cm. Pulmonic Valve: The pulmonic valve was normal in structure. Pulmonic valve regurgitation is not visualized. No evidence of pulmonic stenosis. Aorta: The aortic root is normal in size and structure. Venous: The inferior vena cava is normal in size with greater than 50% respiratory variability, suggesting right atrial pressure of 3 mmHg. IAS/Shunts: No atrial level shunt detected by color flow Doppler.  LEFT VENTRICLE PLAX 2D LVIDd:         3.70 cm LVIDs:         2.90 cm  LV PW:         1.30 cm LV IVS:        1.20 cm LVOT diam:     1.70 cm LV SV:         26 LV SV Index:   18 LVOT Area:     2.27 cm  LV Volumes (MOD) LV vol d, MOD A2C: 30.1 ml LV vol d, MOD A4C: 31.1 ml LV vol s, MOD A2C: 11.1 ml LV vol s, MOD A4C: 13.2 ml LV SV MOD A2C:     19.0 ml LV SV MOD A4C:     31.1 ml LV SV MOD BP:      18.2 ml RIGHT VENTRICLE          IVC RV Basal diam:  3.00 cm  IVC diam: 2.20 cm TAPSE (M-mode): 1.0 cm LEFT ATRIUM           Index       RIGHT ATRIUM           Index LA diam:      4.30 cm 2.95 cm/m  RA Area:     28.10 cm LA Vol (A2C): 89.7 ml 61.54 ml/m RA Volume:   93.50 ml  64.14 ml/m LA Vol (A4C): 52.3 ml 35.88 ml/m  AORTIC VALVE AV Area (Vmax):    1.15 cm AV Area (Vmean):   1.08 cm AV Area (VTI):     1.09 cm AV Vmax:           130.00 cm/s AV Vmean:          87.750 cm/s AV VTI:            0.236 m AV Peak Grad:      6.8 mmHg AV Mean Grad:      3.5 mmHg LVOT Vmax:         66.10 cm/s LVOT Vmean:        41.600 cm/s LVOT VTI:          0.113 m LVOT/AV VTI ratio: 0.48  AORTA Ao Root diam: 2.80 cm Ao Asc diam:  3.00 cm MITRAL VALVE                TRICUSPID VALVE MV Area (PHT): 5.42 cm     TR Peak grad:   47.1 mmHg MV Decel Time: 140 msec     TR Vmax:        343.00 cm/s MV E velocity: 127.00 cm/s MV A velocity: 34.90 cm/s   SHUNTS MV E/A ratio:  3.64          Systemic VTI:  0.11 m                             Systemic Diam: 1.70 cm Candee Furbish MD Electronically signed by Candee Furbish MD Signature Date/Time: 02/24/2021/4:34:31 PM    Final      LOS: 4 days   Antonieta Pert, MD [facility]  02/26/2021, 8:13 AM

## 2021-02-26 NOTE — Progress Notes (Signed)
Occupational Therapy Treatment Patient Details Name: Kelli Peterson MRN: [id number] DOB: [birth year] Today's Date: 02/26/2021    History of present illness Patient is a 87 y/o female who presents with right pelvic fxs (right inferior pubic ramus, junction of right pubic ramus and acetabulum and iliac wing) s/p fall, also noted to have A-fib with RVR and elevated creatinine. CXR-vascular congestion. PMH includes HTN, CHF, A-fib, cervical ca, CAD, CKD stage III, DVT, AAA.   OT comments  Patient supine in bed and agreeable to OT/PT session, eager to get OOB to recliner.  Completing bed mobility with mod assist +2, transfers with mod assist +2 using RW. Remains limited by pain and difficulty weight shifting onto R LE in standing, relies on BUE support and requires cueing to shift anteriorly.  She is able to complete grooming with setup assist seated supported in recliner, requires total assist for LB dressing.  Daughter at side and supportive. SNF remains appropriate at this time.  Will follow.      Follow Up Recommendations  SNF;Supervision/Assistance - 24 hour    Equipment Recommendations  Other (comment) (TBD at next venue of care)    Recommendations for Other Services      Precautions / Restrictions Precautions Precautions: Fall;Other (comment) Precaution Comments: watch 02, premedicate Restrictions Weight Bearing Restrictions: Yes RLE Weight Bearing: Weight bearing as tolerated Other Position/Activity Restrictions: WBAT BLEs       Mobility Bed Mobility Overal bed mobility: Needs Assistance Bed Mobility: Supine to Sit     Supine to sit: Mod assist;+2 for physical assistance;+2 for safety/equipment;HOB elevated     General bed mobility comments: requires full assist for R LE mgmt towards EOB, some assist trunk support and scooting but able to manage L LE, increased time and cueing for technique    Transfers Overall transfer level: Needs assistance Equipment used: Rolling  walker (2 wheeled) Transfers: Sit to/from [facility] Sit to Stand: Mod assist;+2 physical assistance;+2 safety/equipment Stand pivot transfers: Mod assist;+2 physical assistance;+2 safety/equipment       General transfer comment: mod assist +2 to power up and steady from EOB and recliner, cueing for hand placement, anterior shift and safety.  limited by pain when weightshifting onto R LE    Balance Overall balance assessment: Needs assistance Sitting-balance support: No upper extremity supported;Feet supported Sitting balance-Leahy Scale: Fair Sitting balance - Comments: min guard for safety, pt with anterior lean seated EOB   Standing balance support: Bilateral upper extremity supported;During functional activity Standing balance-Leahy Scale: Poor Standing balance comment: relies on BUE and external support                           ADL either performed or assessed with clinical judgement   ADL Overall ADL's : Needs assistance/impaired     Grooming: Wash/dry face;Set up;Sitting               Lower Body Dressing: Total assistance;+2 for physical assistance;+2 for safety/equipment;Sit to/from stand Lower Body Dressing Details (indicate cue type and reason): assist for socks, mod assist +2 standing Toilet Transfer: Moderate assistance;+2 for physical assistance;+2 for safety/equipment;Stand-pivot;RW Toilet Transfer Details (indicate cue type and reason): simulated to recliner         Functional mobility during ADLs: Moderate assistance;+2 for physical assistance;+2 for safety/equipment;Rolling walker;Cueing for sequencing;Cueing for safety General ADL Comments: pt limited by pain, weakness, balance     Vision       Perception  Praxis      Cognition Arousal/Alertness: Awake/alert Behavior During Therapy: WFL for tasks assessed/performed Overall Cognitive Status: Impaired/Different from baseline Area of Impairment:  Attention;Following commands;Memory;Safety/judgement;Awareness;Problem solving                   Current Attention Level: Sustained Memory: Decreased short-term memory Following Commands: Follows one step commands consistently;Follows one step commands with increased time;Follows multi-step commands inconsistently Safety/Judgement: Decreased awareness of safety Awareness: Emergent Problem Solving: Slow processing;Difficulty sequencing;Requires verbal cues;Requires tactile cues General Comments: pt following simple commands with increased time, requires cueing for sequencing and problem solving        Exercises     Shoulder Instructions       General Comments daughter present and supportve, on 4L [place] during session VSS    Pertinent Vitals/ Pain       Pain Assessment: Faces Faces Pain Scale: Hurts even more Pain Location: right pelvis worsened with movement, minimal pain at rest Pain Descriptors / Indicators: Grimacing;Guarding;Discomfort;Moaning Pain Intervention(s): Limited activity within patient's tolerance;Monitored during session;Repositioned  Home Living                                          Prior Functioning/Environment              Frequency  Min 2X/week        Progress Toward Goals  OT Goals(current goals can now be found in the care plan section)  Progress towards OT goals: Progressing toward goals  Acute Rehab OT Goals Patient Stated Goal: improve pain OT Goal Formulation: With patient  Plan Discharge plan remains appropriate;Frequency remains appropriate    Co-evaluation    PT/OT/SLP Co-Evaluation/Treatment: Yes Reason for Co-Treatment: For patient/therapist safety;To address functional/ADL transfers   OT goals addressed during session: ADL's and self-care      AM-PAC OT "6 Clicks" Daily Activity     Outcome Measure   Help from another person eating meals?: A Little Help from another person taking care of  personal grooming?: A Little Help from another person toileting, which includes using toliet, bedpan, or urinal?: Total Help from another person bathing (including washing, rinsing, drying)?: A Lot Help from another person to put on and taking off regular upper body clothing?: A Lot Help from another person to put on and taking off regular lower body clothing?: Total 6 Click Score: 12    End of Session Equipment Utilized During Treatment: Rolling walker;Oxygen  OT Visit Diagnosis: Other abnormalities of gait and mobility (R26.89);Muscle weakness (generalized) (M62.81);Pain;Other symptoms and signs involving cognitive function;History of falling (Z91.81) Pain - Right/Left: Right Pain - part of body: Hip;Leg;Ankle and joints of foot   Activity Tolerance Patient tolerated treatment well   Patient Left in chair;with call bell/phone within reach;with family/visitor present   Nurse Communication Mobility status        Time: 4163-8453 OT Time Calculation (min): 29 min  Charges: OT General Charges $OT Visit: 1 Visit OT Treatments $Self Care/Home Management : 8-22 mins  Jolaine Artist, OT [place] Pager [phone] Office [place] 02/26/2021, 12:51 PM

## 2021-02-26 NOTE — Progress Notes (Signed)
CCMD just called to notify that pt had a 1.94 second pause.  As per A. Duke, NP with cardiology, no need to be alerted by this brief pause due to pt being in AF.

## 2021-02-26 NOTE — Progress Notes (Signed)
Daily Progress Note   Patient Name: Kelli Peterson       Date: 02/26/2021 DOB: 1930-04-02  Age: 87 y.o. MRN#: [id number] Attending Physician: Antonieta Pert, MD Primary Care Physician: [name], DO Admit Date: 02/22/2021  Reason for Consultation/Follow-up: Disposition, Establishing goals of care and Pain control  Subjective: Met with patient and her daughter at the bedside. Patient working with PT more today, took a few steps, OOB to chair for a couple hours thus far. Having some pain, but feels Tylenol has helped. Oxycodone "knocked her out." Patient dozing on and off.  Continues with previous plan: hopeful for rehab.  Length of Stay: 4  Current Medications: Scheduled Meds:  . acetaminophen  1,000 mg Oral TID  . apixaban  2.5 mg Oral BID  . brimonidine  1 drop Right Eye BID  . diltiazem  240 mg Oral Daily  . dorzolamide-timolol  1 drop Both Eyes BID  . feeding supplement  237 mL Oral Q24H  . latanoprost  1 drop Both Eyes Q1200  . mouth rinse  15 mL Mouth Rinse BID  . metoprolol succinate  50 mg Oral QHS    Continuous Infusions: . ampicillin-sulbactam (UNASYN) IV 3 g (02/25/21 2215)    PRN Meds: oxyCODONE  Physical Exam Constitutional:      General: She is sleeping.     Appearance: Normal appearance.  Neurological:     Mental Status: She is easily aroused.  Psychiatric:        Mood and Affect: Mood normal.        Behavior: Behavior normal.             Vital Signs: BP (!) 157/97 (BP Location: Right Arm)   Pulse 97   Temp 98 F (36.7 C) (Oral)   Resp 20   Ht 5' (1.524 m)   Wt 50.5 kg   SpO2 90%   BMI 21.74 kg/m  SpO2: SpO2: 90 % O2 Device: O2 Device: Nasal Cannula O2 Flow Rate: O2 Flow Rate (L/min): 3 L/min  Intake/output summary:   Intake/Output Summary  (Last 24 hours) at 02/26/2021 0709 Last data filed at 02/26/2021 0600 Gross per 24 hour  Intake 200 ml  Output --  Net 200 ml   LBM: Last BM Date: 02/24/21 Baseline Weight: Weight: 48.4 kg Most recent weight: Weight:  50.5 kg       Palliative Assessment/Data: 60%      Patient Active Problem List   Diagnosis Date Noted  . Fall 02/22/2021  . Hip fracture, unspecified laterality, closed, initial encounter ([place]) 02/22/2021  . SOB (shortness of breath)   . Acute on chronic diastolic heart failure ([place])   . Pressure injury of skin 04/17/2020  . Atrial fibrillation with rapid ventricular response ([place])   . Acute on chronic diastolic (congestive) heart failure ([place])   . Pulmonary hypertension ([place])   . Acute kidney injury superimposed on chronic kidney disease ([place])   . CKD (chronic kidney disease), stage III ([place]) 04/15/2020  . Acute hypoxemic respiratory failure ([place]) 04/15/2020  . Branch retinal vein occlusion with macular edema of left eye 01/23/2020  . Branch retinal vein occlusion of right eye 01/23/2020  . Exudative age-related macular degeneration of right eye with inactive choroidal neovascularization ([place]) 01/23/2020  . Intermediate stage nonexudative age-related macular degeneration of left eye 01/23/2020  . Protein-calorie malnutrition, severe 06/02/2018  . AKI (acute kidney injury) ([place]) 05/31/2018  . Dehydration 05/31/2018  . Essential hypertension 05/31/2018  . Atrial fibrillation, chronic ([place]) 05/31/2018  . COPD (chronic obstructive pulmonary disease) ([place]) 05/31/2018  . Glaucoma 05/31/2018  . Hyperlipidemia 05/31/2018  . Acute renal failure (ARF) ([place]) 05/31/2018  . Constipation 05/31/2018  . Acute right hip pain   . Atrial fibrillation with RVR ([place])   . Pelvic fracture, closed, initial encounter 02/15/2016  . Fracture of pubic ramus ([place]) 02/15/2016    Palliative Care Assessment & Plan   Patient Profile: 87 y.o. female with a PMH of multiple  comorbidities with A. fib on Eliquis, CAD, CKD stage III, AAA, hypertension hyperlipidemia, history of DVT, chronic diastolic CHF. She was admitted on 02/22/2021 after an unwitnessed fall in the bathroom and was having right hip pain since then. She has brought to the ED and found to have right pelvic fracture in the x-ray and the right hip CT; work-up showed elevated creatinine.  Assessment: Seems to be making more attempts at rehab with PT today. Was able to take a few steps, has been out of bed to the chair for a couple hours. PT saw the patient today and recommends SNF at this point.  Recommendations/Plan:  Continue to work with therapy  Continued efforts to progress toward SNF/rehab candidacy  Continue pain management to promote therapy participation  Goals of Care and Additional Recommendations:  Limitations on Scope of Treatment: No Artificial Feeding  Code Status:    Code Status Orders  (From admission, onward)         Start     Ordered   02/25/21 1346  Do not attempt resuscitation (DNR)  Continuous       Question Answer Comment  In the event of cardiac or respiratory ARREST Do not call a "code blue"   In the event of cardiac or respiratory ARREST Do not perform Intubation, CPR, defibrillation or ACLS   In the event of cardiac or respiratory ARREST Use medication by any route, position, wound care, and other measures to relive pain and suffering. May use oxygen, suction and manual treatment of airway obstruction as needed for comfort.      02/25/21 1345        Code Status History    Date Active Date Inactive Code Status Order ID Comments User Context   02/22/2021 1447 02/25/2021 1345 Full Code [id number]  Harold Hedge, MD ED   02/22/2021 [phone]  02/22/2021 1447 Full Code [id number]  Harold Hedge, MD ED   04/15/2020 1824 04/24/2020 2321 Full Code [id number]  Mckinley Jewel, MD ED   05/31/2018 2100 06/07/2018 0410 Full Code [id number]  Toy Baker, MD Inpatient    02/15/2016 1853 02/18/2016 1557 Full Code [id number]  Gennaro Africa, MD ED   Advance Care Planning Activity       Prognosis:   Unable to determine  Discharge Planning:  To Be Determined  Thank you for allowing the Palliative Medicine Team to assist in the care of this patient.   Time In: 2:00 Time Out: 2:30 Total Time 30 min Prolonged Time Billed  no       Greater than 50%  of this time was spent counseling and coordinating care related to the above assessment and plan.  Was present for and in agreement with above assessment and plan  Wadie Lessen, NP  Please contact Palliative Medicine Team phone at [phone] for questions and concerns.

## 2021-02-26 NOTE — Progress Notes (Signed)
Physical Therapy Treatment Patient Details Name: Kelli Peterson MRN: [id number] DOB: 07-Oct-1929 Today's Date: 02/26/2021    History of Present Illness Patient is a 87 y/o female who presents with right pelvic fxs (right inferior pubic ramus, junction of right pubic ramus and acetabulum and iliac wing) s/p fall, also noted to have A-fib with RVR and elevated creatinine. CXR-vascular congestion. PMH includes HTN, CHF, A-fib, cervical ca, CAD, CKD stage III, DVT, AAA.    PT Comments    Pt slow to progress toward goals.  Emphasis today on transitions to EOB including work on asymmetrical scoot, sit to standing, transfers and taking steps forward and back in the RW with 2 person moderate assist.    Follow Up Recommendations  SNF;Supervision for mobility/OOB     Equipment Recommendations  None recommended by PT    Recommendations for Other Services       Precautions / Restrictions Precautions Precautions: Fall;Other (comment) Precaution Comments: watch 02, premedicate Restrictions Weight Bearing Restrictions: Yes RLE Weight Bearing: Weight bearing as tolerated Other Position/Activity Restrictions: WBAT BLEs    Mobility  Bed Mobility Overal bed mobility: Needs Assistance Bed Mobility: Supine to Sit     Supine to sit: Mod assist;+2 for physical assistance;+2 for safety/equipment;HOB elevated     General bed mobility comments: requires full assist for R LE mgmt towards EOB, some assist trunk support and scooting but able to manage L LE, increased time and cueing for technique    Transfers Overall transfer level: Needs assistance Equipment used: Rolling walker (2 wheeled) Transfers: Sit to/from [facility] Sit to Stand: Mod assist;+2 physical assistance;+2 safety/equipment Stand pivot transfers: Mod assist;+2 physical assistance;+2 safety/equipment       General transfer comment: mod assist +2 to power up and steady from EOB and recliner, cueing for hand  placement, anterior shift and safety.  limited by pain when weightshifting onto R LE  Ambulation/Gait Ambulation/Gait assistance: Mod assist;+2 physical assistance;+2 safety/equipment Gait Distance (Feet): 4 Feet (first trial and 3 feet forward/2 feet back toward chair with RW) Assistive device: Rolling walker (2 wheeled) Gait Pattern/deviations: Step-to pattern   Gait velocity interpretation: <1.31 ft/sec, indicative of household ambulator General Gait Details: pt still having a difficult time accepting w/bearing on the R, but was able with moderate assist to bear enough to take a few steps, clearing L foot to advance   Stairs             Wheelchair Mobility    Modified Rankin (Stroke Patients Only)       Balance Overall balance assessment: Needs assistance Sitting-balance support: No upper extremity supported;Feet supported Sitting balance-Leahy Scale: Fair Sitting balance - Comments: min guard for safety, pt with anterior lean seated EOB   Standing balance support: Bilateral upper extremity supported;During functional activity Standing balance-Leahy Scale: Poor Standing balance comment: relies on BUE and external support                            Cognition Arousal/Alertness: Awake/alert Behavior During Therapy: WFL for tasks assessed/performed Overall Cognitive Status: Impaired/Different from baseline Area of Impairment: Attention;Following commands;Memory;Safety/judgement;Awareness;Problem solving                   Current Attention Level: Sustained Memory: Decreased short-term memory Following Commands: Follows one step commands consistently;Follows one step commands with increased time;Follows multi-step commands inconsistently Safety/Judgement: Decreased awareness of safety Awareness: Emergent Problem Solving: Slow processing;Difficulty sequencing;Requires verbal cues;Requires tactile cues General  Comments: pt following simple commands with  increased time, requires cueing for sequencing and problem solving      Exercises Other Exercises Other Exercises: warm up AAROM/graded resistance in bil hip/knee flex/ext.x10 reps bil    General Comments General comments (skin integrity, edema, etc.): dtr present and supportive.  On 4L [place] during the session and VSS      Pertinent Vitals/Pain Pain Assessment: Faces Faces Pain Scale: Hurts even more Pain Location: right pelvis worsened with movement, minimal pain at rest Pain Descriptors / Indicators: Grimacing;Guarding;Discomfort;Moaning Pain Intervention(s): Limited activity within patient's tolerance;Monitored during session    Home Living                      Prior Function            PT Goals (current goals can now be found in the care plan section) Acute Rehab PT Goals Patient Stated Goal: improve pain PT Goal Formulation: With patient Time For Goal Achievement: 03/09/21 Potential to Achieve Goals: Fair Progress towards PT goals: Progressing toward goals    Frequency    Min 3X/week      PT Plan Current plan remains appropriate    Co-evaluation PT/OT/SLP Co-Evaluation/Treatment: Yes Reason for Co-Treatment: For patient/therapist safety   OT goals addressed during session: ADL's and self-care      AM-PAC PT "6 Clicks" Mobility   Outcome Measure  Help needed turning from your back to your side while in a flat bed without using bedrails?: A Lot Help needed moving from lying on your back to sitting on the side of a flat bed without using bedrails?: A Lot Help needed moving to and from a bed to a chair (including a wheelchair)?: A Lot Help needed standing up from a chair using your arms (e.g., wheelchair or bedside chair)?: A Lot Help needed to walk in hospital room?: Total Help needed climbing 3-5 steps with a railing? : Total 6 Click Score: 10    End of Session Equipment Utilized During Treatment: Oxygen Activity Tolerance: Patient tolerated  treatment well;Patient limited by pain Patient left: in chair;with call bell/phone within reach;with family/visitor present Nurse Communication: Mobility status;Need for lift equipment PT Visit Diagnosis: Other abnormalities of gait and mobility (R26.89);Muscle weakness (generalized) (M62.81);Pain Pain - Right/Left: Right Pain - part of body:  (LE/pelvis)     Time: 2952-8413 PT Time Calculation (min) (ACUTE ONLY): 29 min  Charges:  $Therapeutic Activity: 8-22 mins                     02/26/2021  Ginger Carne., PT Acute Rehabilitation Services [phone]  (pager) [phone]  (office)   Tessie Fass Juliane Guest 02/26/2021, 2:05 PM

## 2021-02-27 ENCOUNTER — Other Ambulatory Visit: Payer: Self-pay | Admitting: Physician Assistant

## 2021-02-27 DIAGNOSIS — S72009A Fracture of unspecified part of neck of unspecified femur, initial encounter for closed fracture: Secondary | ICD-10-CM | POA: Diagnosis not present

## 2021-02-27 DIAGNOSIS — I4891 Unspecified atrial fibrillation: Secondary | ICD-10-CM | POA: Diagnosis not present

## 2021-02-27 DIAGNOSIS — K869 Disease of pancreas, unspecified: Secondary | ICD-10-CM

## 2021-02-27 DIAGNOSIS — Z515 Encounter for palliative care: Secondary | ICD-10-CM | POA: Diagnosis not present

## 2021-02-27 LAB — BASIC METABOLIC PANEL
Anion gap: 7 (ref 5–15)
BUN: 25 mg/dL — ABNORMAL HIGH (ref 8–23)
CO2: 27 mmol/L (ref 22–32)
Calcium: 8.4 mg/dL — ABNORMAL LOW (ref 8.9–10.3)
Chloride: 105 mmol/L (ref 98–111)
Creatinine, Ser: 1.19 mg/dL — ABNORMAL HIGH (ref 0.44–1.00)
GFR, Estimated: 44 mL/min — ABNORMAL LOW (ref >60)
Glucose, Bld: 108 mg/dL — ABNORMAL HIGH (ref 70–99)
Potassium: 3.5 mmol/L (ref 3.5–5.1)
Sodium: 139 mmol/L (ref 135–145)

## 2021-02-27 LAB — URINE CULTURE: Culture: 30000 — AB

## 2021-02-27 MED ORDER — FUROSEMIDE 40 MG PO TABS
40.0000 mg | ORAL_TABLET | Freq: Once | ORAL | Status: AC
  Administered 2021-02-27: 40 mg via ORAL

## 2021-02-27 NOTE — Plan of Care (Signed)
  Problem: Education: Goal: Knowledge of General Education information will improve Description Including pain rating scale, medication(s)/side effects and non-pharmacologic comfort measures Outcome: Progressing   

## 2021-02-27 NOTE — Progress Notes (Signed)
PROGRESS NOTE    Kelli Peterson  OBS:[id number] DOB: [birth year] DOA: 02/22/2021 PCP: [name], DO   Chief Complaint  Patient presents with  . Fall  . Hip Pain  Brief Narrative: 87 year old female with multiple comorbidities with A. fib on Eliquis, CAD, CKD stage III AAA, hypertension hyperlipidemia history of DVT chronic diastolic CHF presented from home with unwitnessed fall in the bathroom and was having right hip pain since then.  She has brought to the ED.  She was found to have right pelvic fracture in the x-ray and the right hip CT work-up showed elevated creatinine. Patient was transferred to [facility] seen by trauma surgery orthopedic surgery. She was hypoxic given Lasix and also having A. fib with RVR and was placed on Cardizem drip. Patient was admitted seen by orthopedic and trauma advised nonoperative management.  Patient remained sleepy and lethargic also hypoxic with AKI worsening leukocytosis. Blood cultures urine culture sent placed on empiric antibiotics for possible infection ? Aspiration, UTI seen by speech and cleared for diet.  PT OT advised skilled nursing facility Seen by cardiology switched to p.o. Cardizem 5/24 Patient has been more alert oriented, still needing oxygen, leukocyte has resolved, creatinine improving.  She is followed by palliative care Goc discussion initiated and changed to DNR, MOST form completed.  Working with PT OT. At this time awaiting for skilled nursing positive placement.  Subjective: Seen and examined this morning. Overnight brief pause was noted on telemetry cardiology notified Afebrile on 5 L nasal cannula overnight Patient denies shortness of breath. Daughter at the bedside-who ias encouraged that patient was able to participate with PT yesterday.  Assessment & Plan:  Right pelvic fracture (Rt inferior pubic ramus, junction of Rt pubic ramus and acetabulum and iliac wing): Secondary to fall. Seen by trauma,orthopedics-advised  nonoperative intervention PT OT WBAT, and follow-up with Dr. [name] in 2 to 3 weeks.Cont  pain control -with the scheduled Tylenol, patient is very sensitive to narcotics becoming very lethargic.  Continue PT OT awaiting for skilled nursing facility.   AKI and CKD,stage IIIa: Baseline creatinine ranging 1.1-1.4.Creatinine worsened and placed on IV fluids and AKI has resolved.  Encourage p.o.  Can use Lasix 40 mg as needed for shortness of breath/weight gain or fluid overload.   Recent Labs  Lab 02/23/21 1850 02/24/21 0417 02/25/21 0037 02/26/21 0052 02/27/21 0146  BUN 50* 47* 39* 28* 25*  CREATININE 2.04* 2.03* 1.61* 1.16* 1.19*   Hypokalemia resolved  Atrial fibrillation with rapid ventricular response: off Cardizem drip, now on cardizem to 40 mg and metoprolol 50 bedtime, home Eliquis 2.5 mg twice daily.  Overnight pulse noted cardiology aware, no further intervention needed.Echo shows EF 55 to 47% grade 3 diastolic dysfunction/restrictive moderately elevated pulmonary artery systolic pressure, RVSP 65.4  E. coli UTI POA: Sensitive to Unasyn continue same and can change to p.o. Keflex upon discharge.  Due to her swallowing issues-concern for aspiration,monitor respiratory status.Encourage incentive spirometry ambulation. Chest x-ray-enlarged cardiac silhouette with suspected mild pulmonary edema.Previous imaging on previous admission there was concern for chronic lung disease.   Recent Labs  Lab 02/22/21 1334 02/23/21 0212 02/24/21 0417 02/25/21 0037 02/26/21 0052  WBC 17.9* 15.8* 21.2* 10.5 11.7*  PROCALCITON  --   --  0.78 0.50  --    Grade 3 chronic diastolic CHF/x-ray showing mild pulmonary edema/congestion:Previous imaging per cardiology concerning for chronic lung disease will likely need to go home with oxygen.  Needing [place] 02 and will need on d/c.  Wean 02.  will do Lasix 40 po x1.Cardiology following and appreciate input.  Echocardiogram shows grade 3 diastolic dysfunction with  normal EF. bnp  In 220, previously 528. Filed Weights   02/23/21 0358 02/24/21 0336 02/25/21 0334  Weight: 48.4 kg 50.7 kg 50.5 kg   Net IO Since Admission: 1,857.65 mL [02/27/21 1034]  Acute hypoxemic respiratory failure/Concern for pulmonary vascular disease versus ILD: Previous imaging likely has chronic lung disease.At this time she is needing oxygen.on 5lCNc- wean.Volume status stable monitor closely watch for fluid overload  trying PRN lasix x1 today. S/p laisx iv x1 on 5/26 by cards.Appreciate cardiology input.   Net IO Since Admission: 1,857.65 mL [02/27/21 0747]  Filed Weights   02/23/21 0358 02/24/21 0336 02/25/21 0334  Weight: 48.4 kg 50.7 kg 50.5 kg   Lethargy:Patient has been lethargic/sleepy on and off likely form uti and from narcotics.Similar episode when she had AKI few years ago per daughter.Minimize narcotics.Continue on supportive measures.  Choking episode with eating she has had similar issues in the past.  SLP cleared for diet now on DYS 3 diet. Cont aspiration precaution.  Anemia hemoglobin stable Recent Labs  Lab 02/22/21 2255 02/23/21 0212 02/24/21 0417 02/25/21 0037 02/26/21 0052  HGB 13.6 13.0 10.7* 9.5* 10.0*  HCT 42.5 40.5 32.7* 29.7* 31.3*   CAD/HTN/HLD:BP is stable continue Cardizem and metoprolol.  Holding Demadex.    Goals of care: Palliative care following, changed to DNR.  Overall prognosis is not bright  Protein-calorie malnutrition severe as below continue to augment diet as tolerated Nutrition Problem: Severe Malnutrition Etiology: chronic illness Signs/Symptoms: energy intake < 75% for > or equal to 1 month,moderate fat depletion,severe fat depletion,moderate muscle depletion,severe muscle depletion,percent weight loss Percent weight loss: 8 % Interventions: Ensure Enlive (each supplement provides 350kcal and 20 grams of protein),Magic cup,MVI  Diet Order            DIET DYS 3 Room service appropriate? Yes; Fluid consistency: Thin  Diet  effective now               Patient's Body mass index is 21.74 kg/m. DVT prophylaxis: apixaban (ELIQUIS) tablet 2.5 mg Start: 02/25/21 1215 SCDs Start: 02/22/21 1448 SCDs Start: 02/22/21 1448 Code Status:   Code Status: DNR  Family Communication: plan of care discussed with patient and her daughter was updated extensively at bedside 5/24, palliative care following closely.  Status is: Inpatient Remains inpatient appropriate because:IV treatments appropriate due to intensity of illness or inability to take PO and Inpatient level of care appropriate due to severity of illness  Dispo: The patient is from: Home              Anticipated d/c is to: SNF, once she is able to participate better              Patient currently is not medically stable to d/c.   Difficult to place patient No  Unresulted Labs (From admission, onward)          Start     Ordered   Unscheduled  Occult blood card to lab, stool RN will collect  As needed,   R     Question:  Specimen to be collected by:  Answer:  RN will collect   02/24/21 1139          Medications reviewed:  Scheduled Meds: . acetaminophen  1,000 mg Oral TID  . apixaban  2.5 mg Oral BID  . brimonidine  1 drop Right Eye BID  .  diltiazem  240 mg Oral Daily  . dorzolamide-timolol  1 drop Both Eyes BID  . feeding supplement  237 mL Oral Q24H  . latanoprost  1 drop Both Eyes Q1200  . mouth rinse  15 mL Mouth Rinse BID  . metoprolol succinate  50 mg Oral QHS   Continuous Infusions: . ampicillin-sulbactam (UNASYN) IV Stopped (02/26/21 2319)    Consultants:see note  Procedures:see note  Antimicrobials: Anti-infectives (From admission, onward)   Start     Dose/Rate Route Frequency Ordered Stop   02/24/21 1100  Ampicillin-Sulbactam (UNASYN) 3 g in sodium chloride 0.9 % 100 mL IVPB        3 g 200 mL/hr over 30 Minutes Intravenous Every 12 hours 02/24/21 0928       Culture/Microbiology    Component Value Date/Time   SDES URINE, RANDOM  02/24/2021 1516   SPECREQUEST  02/24/2021 1516    NONE Performed at [facility], [place] [address] [place], [place] [address]    CULT 30,000 COLONIES/mL ESCHERICHIA COLI (A) 02/24/2021 1516   REPTSTATUS 02/27/2021 FINAL 02/24/2021 1516    Other culture-see note  Objective: Vitals: Today's Vitals   02/26/21 1928 02/26/21 1947 02/26/21 2345 02/27/21 0349  BP: 138/79  124/79 133/89  Pulse: 99  77 99  Resp: 18  18 16   Temp: 98 F (36.7 C)  (!) 97.5 F (36.4 C) 97.7 F (36.5 C)  TempSrc: Oral  Oral Oral  SpO2: 98%  99% 99%  Weight:      Height:      PainSc:  0-No pain      Intake/Output Summary (Last 24 hours) at 02/27/2021 0740 Last data filed at 02/27/2021 0350 Gross per 24 hour  Intake 900 ml  Output 1300 ml  Net -400 ml   Filed Weights   02/23/21 0358 02/24/21 0336 02/25/21 0334  Weight: 48.4 kg 50.7 kg 50.5 kg   Weight change:   Intake/Output from previous day: 05/26 0701 - 05/27 0700 In: 900 [P.O.:700; IV Piggyback:200] Out: 1300 [Urine:1300] Intake/Output this shift: No intake/output data recorded. Filed Weights   02/23/21 0358 02/24/21 0336 02/25/21 0334  Weight: 48.4 kg 50.7 kg 50.5 kg    Examination: General exam: AAO at baseline elderly frail lady on nasal cannula oxygen. HEENT:Oral mucosa moist, Ear/Nose WNL grossly, dentition normal. Respiratory system: bilaterally diminished, with mild basal crackles, no use of accessory muscle Cardiovascular system: S1 & S2 +, No JVD,. Gastrointestinal system: Abdomen soft, NT,ND, BS+ Nervous System:Alert, awake, moving extremities and grossly nonfocal Extremities:  trace edema, distal peripheral pulses palpable.  Skin: No rashes,no icterus. MSK: thin muscle bulk,tone, power  Data Reviewed: I have personally reviewed following labs and imaging studies CBC: Recent Labs  Lab 02/22/21 1334 02/22/21 1700 02/22/21 2255 02/23/21 0212 02/24/21 0417 02/25/21 0037 02/26/21 0052  WBC 17.9*  --   --  15.8*  21.2* 10.5 11.7*  NEUTROABS 16.0*  --   --   --   --   --   --   HGB 14.1   < > 13.6 13.0 10.7* 9.5* 10.0*  HCT 44.5   < > 42.5 40.5 32.7* 29.7* 31.3*  MCV 87.9  --   --  86.7 86.7 86.8 87.4  PLT 226  --   --  189 144* PLATELET CLUMPS NOTED ON SMEAR, COUNT APPEARS DECREASED 123*   < > = values in this interval not displayed.   Basic Metabolic Panel: Recent Labs  Lab 02/23/21 1850 02/24/21 0417 02/25/21  0037 02/26/21 0052 02/27/21 0146  NA 135 136 137 137 139  K 4.4 3.9 3.4* 4.1 3.5  CL 102 103 106 105 105  CO2 20* 25 24 23 27   GLUCOSE 133* 130* 117* 116* 108*  BUN 50* 47* 39* 28* 25*  CREATININE 2.04* 2.03* 1.61* 1.16* 1.19*  CALCIUM 8.3* 8.0* 8.0* 8.3* 8.4*   GFR: Estimated Creatinine Clearance: 23.5 mL/min (A) (by C-G formula based on SCr of 1.19 mg/dL (H)). Liver Function Tests: No results for input(s): AST, ALT, ALKPHOS, BILITOT, PROT, ALBUMIN in the last 168 hours. No results for input(s): LIPASE, AMYLASE in the last 168 hours. No results for input(s): AMMONIA in the last 168 hours. Coagulation Profile: No results for input(s): INR, PROTIME in the last 168 hours. Cardiac Enzymes: No results for input(s): CKTOTAL, CKMB, CKMBINDEX, TROPONINI in the last 168 hours. BNP (last 3 results) No results for input(s): PROBNP in the last 8760 hours. HbA1C: No results for input(s): HGBA1C in the last 72 hours. CBG: No results for input(s): GLUCAP in the last 168 hours. Lipid Profile: No results for input(s): CHOL, HDL, LDLCALC, TRIG, CHOLHDL, LDLDIRECT in the last 72 hours. Thyroid Function Tests: Recent Labs    02/25/21 0037  TSH 0.583   Anemia Panel: No results for input(s): VITAMINB12, FOLATE, FERRITIN, TIBC, IRON, RETICCTPCT in the last 72 hours. Sepsis Labs: Recent Labs  Lab 02/24/21 0417 02/25/21 0037  PROCALCITON 0.78 0.50    Recent Results (from the past 240 hour(s))  Resp Panel by RT-PCR (Flu A&B, Covid) Nasopharyngeal Swab     Status: None   Collection  Time: 02/22/21  2:50 PM   Specimen: Nasopharyngeal Swab; Nasopharyngeal(NP) swabs in vial transport medium  Result Value Ref Range Status   SARS Coronavirus 2 by RT PCR NEGATIVE NEGATIVE Final    Comment: (NOTE) SARS-CoV-2 target nucleic acids are NOT DETECTED.  The SARS-CoV-2 RNA is generally detectable in upper respiratory specimens during the acute phase of infection. The lowest concentration of SARS-CoV-2 viral copies this assay can detect is 138 copies/mL. A negative result does not preclude SARS-Cov-2 infection and should not be used as the sole basis for treatment or other patient management decisions. A negative result may occur with  improper specimen collection/handling, submission of specimen other than nasopharyngeal swab, presence of viral mutation(s) within the areas targeted by this assay, and inadequate number of viral copies(<138 copies/mL). A negative result must be combined with clinical observations, patient history, and epidemiological information. The expected result is Negative.  Fact Sheet for Patients:  EntrepreneurPulse.com.au  Fact Sheet for Healthcare Providers:  IncredibleEmployment.be  This test is no t yet approved or cleared by the [place] FDA and  has been authorized for detection and/or diagnosis of SARS-CoV-2 by FDA under an Emergency Use Authorization (EUA). This EUA will remain  in effect (meaning this test can be used) for the duration of the COVID-19 declaration under Section 564(b)(1) of the Act, 21 U.S.C.section 360bbb-3(b)(1), unless the authorization is terminated  or revoked sooner.       Influenza A by PCR NEGATIVE NEGATIVE Final   Influenza B by PCR NEGATIVE NEGATIVE Final    Comment: (NOTE) The Xpert Xpress SARS-CoV-2/FLU/RSV plus assay is intended as an aid in the diagnosis of influenza from Nasopharyngeal swab specimens and should not be used as a sole basis for treatment. Nasal washings  and aspirates are unacceptable for Xpert Xpress SARS-CoV-2/FLU/RSV testing.  Fact Sheet for Patients: EntrepreneurPulse.com.au  Fact Sheet for Healthcare Providers: IncredibleEmployment.be  This test is not yet approved or cleared by the [place] and has been authorized for detection and/or diagnosis of SARS-CoV-2 by FDA under an Emergency Use Authorization (EUA). This EUA will remain in effect (meaning this test can be used) for the duration of the COVID-19 declaration under Section 564(b)(1) of the Act, 21 U.S.C. section 360bbb-3(b)(1), unless the authorization is terminated or revoked.  Performed at [facility], [place] [address] [place], [place] [address]   Culture, blood (Routine X 2) w Reflex to ID Panel     Status: None (Preliminary result)   Collection Time: 02/24/21  8:47 AM   Specimen: BLOOD RIGHT HAND  Result Value Ref Range Status   Specimen Description BLOOD RIGHT HAND  Final   Special Requests   Final    BOTTLES DRAWN AEROBIC AND ANAEROBIC Blood Culture adequate volume   Culture   Final    NO GROWTH 2 DAYS Performed at [facility], [place] [address], [place], [place] [address]    Report Status PENDING  Incomplete  Culture, blood (Routine X 2) w Reflex to ID Panel     Status: None (Preliminary result)   Collection Time: 02/24/21  8:47 AM   Specimen: BLOOD RIGHT HAND  Result Value Ref Range Status   Specimen Description BLOOD RIGHT HAND  Final   Special Requests   Final    BOTTLES DRAWN AEROBIC AND ANAEROBIC Blood Culture adequate volume   Culture   Final    NO GROWTH 2 DAYS Performed at [facility], [place] [address] [place], [place] [address]    Report Status PENDING  Incomplete  Culture, Urine     Status: Abnormal   Collection Time: 02/24/21  3:16 PM   Specimen: Urine, Random  Result Value Ref Range Status   Specimen Description URINE, RANDOM  Final   Special Requests   Final     NONE Performed at [facility], [place] [address], [place], [place] [address]    Culture 30,000 COLONIES/mL ESCHERICHIA COLI (A)  Final   Report Status 02/27/2021 FINAL  Final   Organism ID, Bacteria ESCHERICHIA COLI (A)  Final      Susceptibility   Escherichia coli - MIC*    AMPICILLIN <=2 SENSITIVE Sensitive     CEFAZOLIN <=4 SENSITIVE Sensitive     CEFEPIME <=0.12 SENSITIVE Sensitive     CEFTRIAXONE <=0.25 SENSITIVE Sensitive     CIPROFLOXACIN <=0.25 SENSITIVE Sensitive     GENTAMICIN <=1 SENSITIVE Sensitive     IMIPENEM <=0.25 SENSITIVE Sensitive     NITROFURANTOIN <=16 SENSITIVE Sensitive     TRIMETH/SULFA >=320 RESISTANT Resistant     AMPICILLIN/SULBACTAM <=2 SENSITIVE Sensitive     PIP/TAZO <=4 SENSITIVE Sensitive     * 30,000 COLONIES/mL ESCHERICHIA COLI     Radiology Studies: No results found.   LOS: 5 days   Antonieta Pert, MD [facility]  02/27/2021, 7:40 AM

## 2021-02-27 NOTE — Progress Notes (Signed)
Nutrition Follow Up  DOCUMENTATION CODES:   Severe malnutrition in context of chronic illness  INTERVENTION:   D/C Magic Cup   Continue Ensure Enlive po daily, each supplement provides 350 kcal and 20 grams of protein  Continue MVI daily   NUTRITION DIAGNOSIS:   Severe Malnutrition related to chronic illness as evidenced by energy intake < 75% for > or equal to 1 month,moderate fat depletion,severe fat depletion,moderate muscle depletion,severe muscle depletion,percent weight loss.  Ongoing  GOAL:   Patient will meet greater than or equal to 90% of their needs   Progressing   MONITOR:  PO intake,Supplement acceptance,Labs,Weight trends,I & O's  REASON FOR ASSESSMENT:   Consult Hip fracture protocol  ASSESSMENT:   87 yo female with a PMH of A-fib, CAD, CKD stage 3a, HTN, HLD, DVT, and chronic diastolic CHF who presents with R pelvic fracture after a fall at home.   Diet downgraded to DYS 3 with thin liquids on 5/25. Daughter states this texture is tolerated well by patient. Last four meal completions charted as 30-75%. Patient taking once Ensure daily and does not like [employer].   Of note patient and family met with palliative. They do not wish to have artifical feeding. Continue current supplements.   Admission weight: 48.4 kg  Current weight: 50.5 kg   Medications: reviewed  Labs: CBG 108-117  Diet Order:   Diet Order            DIET DYS 3 Room service appropriate? Yes; Fluid consistency: Thin  Diet effective now                EDUCATION NEEDS:  Education needs have been addressed  Skin:  Skin Assessment: Skin Integrity Issues: Skin Integrity Issues:: Other (Comment) Other: non pressure- sacrum  Last BM:  5/25  Height:  Ht Readings from Last 1 Encounters:  02/24/21 5' (1.524 m)   Weight:  Wt Readings from Last 1 Encounters:  02/25/21 50.5 kg   Ideal Body Weight:  45.5 kg  BMI:  Body mass index is 21.74 kg/m.  Estimated Nutritional  Needs:   Kcal:  1500-1700 kcal   Protein:  75-90 grams   Fluid:  >/= 1.5 L/day  Mariana Single RD, LDN Clinical Nutrition Pager listed in [place]

## 2021-02-27 NOTE — Progress Notes (Signed)
SLP Cancellation Note  Patient Details Name: Kelli Peterson MRN: [id number] DOB: October 19, 1929   Cancelled treatment:       Reason Eval/Treat Not Completed: Fatigue/lethargy limiting ability to participate. Pt asleep upon SLP arrival and daughter at bedside requests that she be allowed to sleep. She does say that overall the mechanical soft diet seems to be going better, and that she coughed only once with breakfast when she got too much food in her mouth. Will plan to f/u next week if still inpatient (per daughter, pt may d/c as soon as this weekend - if so, SLP f/u could continue at [facility]).     Osie Bond., M.A. [facility] Pager [phone] Office [phone]  02/27/2021, 9:37 AM

## 2021-02-27 NOTE — Progress Notes (Signed)
    Progress Note from the Palliative Medicine Team at [facility]   Patient Name: Kelli Peterson        Date: 02/27/2021 DOB: [birth year]  Age: 87 y.o. MRN#: [id number] Attending Physician: Antonieta Pert, MD Primary Care Physician: [name], DO Admit Date: 02/22/2021   Medical records reviewed. PT has not seen yet. Cr continues to be stable. Still on scheduled Tylenol, very sensitive to narcotics and seems to be making her lethargic so they haven't given it since 5/25.  This NP visited patient at the bedside as a follow up to  yesterday's [place]. Daughter, Debbe Odea, at bedside. Patient ate 50-75% of lunch. Was OOB to chair x 2 hours yesterday after being able to take a few steps. Has been doing reguler LE exercises in bed with no obvious/mention of pain. PT hopefully to see her later today. No ongoing significant pain, feel that Tylenol is helping.  Discussed with patient and daughter the importance of continued conversation with family and their  medical providers regarding overall plan of care and treatment options,  ensuring decisions are within the context of the patients values and GOCs.  Questions and concerns addressed, none today.  Per patient's daughter, told to anticipate discharge (likely SNF/Rehab) in the next 1-2 days.  Total time spent on the unit was 15 minutes  Greater than 50% of the time was spent in counseling and coordination of care  Walden Field, NP  Florentina Jenny, [facility] Palliative Medicine Team Team Phone # [phone] Pager [phone]

## 2021-02-27 NOTE — Progress Notes (Signed)
Physical Therapy Treatment Patient Details Name: Kelli Peterson MRN: [id number] DOB: Aug 13, 1930 Today's Date: 02/27/2021    History of Present Illness Patient is a 87 y/o female who presents with right pelvic fxs (right inferior pubic ramus, junction of right pubic ramus and acetabulum and iliac wing) s/p fall, also noted to have A-fib with RVR and elevated creatinine. CXR-vascular congestion. PMH includes HTN, CHF, A-fib, cervical ca, CAD, CKD stage III, DVT, AAA.    PT Comments    Pt is making slow, but noticeable progress toward goals.  Emphasis on warm up/strengthening ROM exercise at Bil U and LE's, transition to EOB, sitting balance/scooting, sit to stand and pivot transfer bed to recliner with face to face moderate assist.    Follow Up Recommendations  SNF;Supervision for mobility/OOB     Equipment Recommendations  None recommended by PT    Recommendations for Other Services       Precautions / Restrictions Precautions Precautions: Fall Precaution Comments: watch 02, premedicate Restrictions RLE Weight Bearing: Weight bearing as tolerated Other Position/Activity Restrictions: WBAT BLEs    Mobility  Bed Mobility Overal bed mobility: Needs Assistance Bed Mobility: Supine to Sit     Supine to sit: Mod assist     General bed mobility comments: assist bil LE, but pt helped more with the R LE today.  pt assisted bridging and truncal assist up via R elbow to sitting.  Pt with weak asymmetrical scoot.    Transfers Overall transfer level: Needs assistance Equipment used: None Transfers: Sit to/from [facility] Sit to Stand: Mod assist (x2) Stand pivot transfers: Mod assist       General transfer comment: face to face assist with pt support on forearms for forward and boost assist, then w/shift and stepping pivots to the chair with support.  Ambulation/Gait                 Stairs             Wheelchair Mobility    Modified  [place] (Stroke Patients Only)       Balance     Sitting balance-Leahy Scale: Fair       Standing balance-Leahy Scale: Poor Standing balance comment: external support during stance and pivot.                            Cognition Arousal/Alertness: Awake/alert Behavior During Therapy: WFL for tasks assessed/performed Overall Cognitive Status:  (NT formally, repetitive cues for direction and following commands.)                                        Exercises Other Exercises Other Exercises: AA to graded resisted hip/knee flexion/ext x10 reps bil Other Exercises: graded resistance bicep/tricep pressess x 10 reps bil.    General Comments General comments (skin integrity, edema, etc.): Dtr present, SpO2 in the low 90's on supplemental 02      Pertinent Vitals/Pain Pain Assessment: Faces Faces Pain Scale: Hurts even more Pain Location: R pelvis with R LE movement or w/bearing Pain Descriptors / Indicators: Grimacing;Guarding;Moaning Pain Intervention(s): Monitored during session;Limited activity within patient's tolerance;Premedicated before session    Home Living                      Prior Function  PT Goals (current goals can now be found in the care plan section) Acute Rehab PT Goals Patient Stated Goal: improve pain PT Goal Formulation: With patient Time For Goal Achievement: 03/09/21 Potential to Achieve Goals: Fair Progress towards PT goals: Progressing toward goals    Frequency    Min 3X/week      PT Plan Current plan remains appropriate    Co-evaluation              AM-PAC PT "6 Clicks" Mobility   Outcome Measure  Help needed turning from your back to your side while in a flat bed without using bedrails?: A Lot Help needed moving from lying on your back to sitting on the side of a flat bed without using bedrails?: A Lot Help needed moving to and from a bed to a chair (including a wheelchair)?:  A Lot Help needed standing up from a chair using your arms (e.g., wheelchair or bedside chair)?: A Lot Help needed to walk in hospital room?: Total Help needed climbing 3-5 steps with a railing? : Total 6 Click Score: 10    End of Session Equipment Utilized During Treatment: Oxygen Activity Tolerance: Patient tolerated treatment well;Patient limited by pain Patient left: in chair;with call bell/phone within reach;with family/visitor present Nurse Communication: Mobility status;Need for lift equipment PT Visit Diagnosis: Other abnormalities of gait and mobility (R26.89);Muscle weakness (generalized) (M62.81);Pain Pain - Right/Left: Right Pain - part of body:  (pelvic/leg pain)     Time: 1730-1753 PT Time Calculation (min) (ACUTE ONLY): 23 min  Charges:  $Therapeutic Exercise: 8-22 mins $Therapeutic Activity: 8-22 mins                     02/27/2021  Ginger Carne., PT Acute Rehabilitation Services [phone]  (pager) [phone]  (office)   Tessie Fass Allyce Bochicchio 02/27/2021, 6:10 PM

## 2021-02-28 DIAGNOSIS — J9601 Acute respiratory failure with hypoxia: Secondary | ICD-10-CM | POA: Diagnosis not present

## 2021-02-28 DIAGNOSIS — N1831 Chronic kidney disease, stage 3a: Secondary | ICD-10-CM | POA: Diagnosis not present

## 2021-02-28 DIAGNOSIS — I4891 Unspecified atrial fibrillation: Secondary | ICD-10-CM | POA: Diagnosis not present

## 2021-02-28 DIAGNOSIS — S72009A Fracture of unspecified part of neck of unspecified femur, initial encounter for closed fracture: Secondary | ICD-10-CM | POA: Diagnosis not present

## 2021-02-28 LAB — BASIC METABOLIC PANEL
Anion gap: 7 (ref 5–15)
BUN: 25 mg/dL — ABNORMAL HIGH (ref 8–23)
CO2: 25 mmol/L (ref 22–32)
Calcium: 8.6 mg/dL — ABNORMAL LOW (ref 8.9–10.3)
Chloride: 107 mmol/L (ref 98–111)
Creatinine, Ser: 1.05 mg/dL — ABNORMAL HIGH (ref 0.44–1.00)
GFR, Estimated: 51 mL/min — ABNORMAL LOW (ref >60)
Glucose, Bld: 111 mg/dL — ABNORMAL HIGH (ref 70–99)
Potassium: 3.6 mmol/L (ref 3.5–5.1)
Sodium: 139 mmol/L (ref 135–145)

## 2021-02-28 LAB — CBC
HCT: 28.3 % — ABNORMAL LOW (ref 36.0–46.0)
Hemoglobin: 9.2 g/dL — ABNORMAL LOW (ref 12.0–15.0)
MCH: 28.2 pg (ref 26.0–34.0)
MCHC: 32.5 g/dL (ref 30.0–36.0)
MCV: 86.8 fL (ref 80.0–100.0)
Platelets: 145 10*3/uL — ABNORMAL LOW (ref 150–400)
RBC: 3.26 MIL/uL — ABNORMAL LOW (ref 3.87–5.11)
RDW: 17.2 % — ABNORMAL HIGH (ref 11.5–15.5)
WBC: 9 10*3/uL (ref 4.0–10.5)
nRBC: 0 % (ref 0.0–0.2)

## 2021-02-28 NOTE — TOC Progression Note (Addendum)
Transition of Care [facility]) - Progression Note    Patient Details  Name: DERRIONA BRANSCOM MRN: [id number] Date of Birth: 12/18/1929  Transition of Care [facility]) CM/SW [place], LCSW Phone Number: [phone] 02/28/2021, 9:51 AM  Clinical Narrative:    As per TOC note, pt needed to participate in PT more for [place] to possibly accepted. CSW followed up with Kirstin at [facility] and she will review referral.  10:35 am- CSW received notification back from [facility] and she stated that they are unable to offer pt a bed due to little progression with PT.  1:20:PM- CSW spoke with pt's daughter [name] about discharge planning needs. CSW let  [name] know that pt was not accepted at [facility] and CSW has left a message at [place]. CSW asked for another choice and [name] stated [facility].  CSW reached out to [place] at [facility] and awaiting to here back.  CSW also reached out to [place] at [place] and left a voice mail.  TOC team will continue to assist with discharge planning needs.   Expected Discharge Plan: [place] Barriers to Discharge: Continued Medical Work up  Expected Discharge Plan and Services Expected Discharge Plan: [place] arrangements for the past 2 months: Single Family Home                                       Social Determinants of Health (SDOH) Interventions    Readmission Risk Interventions No flowsheet data found.

## 2021-02-28 NOTE — Progress Notes (Signed)
PROGRESS NOTE        PATIENT DETAILS Name: Kelli Peterson Age: 87 y.o. Sex: female Date of Birth: [birth year] Admit Date: 02/22/2021 Admitting Physician Harold Hedge, MD PFX:[id number], Hinton Dyer, DO  Brief Narrative: Patient is a 87 y.o. female with history of A. fib, CAD, CKD stage IIIa, HTN, HLD, history of DVT, chronic diastolic heart failure-presented to the hospital following a mechanical fall-she was found to have numerous pelvic fractures.  Hospital course complicated by A. fib with RVR, AKI, acute hypoxic respiratory failure due to suspected aspiration pneumonia.  See below for further details.   Significant studies: 5/22>> CT pelvis: Numerous fractures of the right hemipelvis. 5/22>> CT head: No acute abnormalities. 5/24>>Echo: EF 35-32%, grade 3 diastolic dysfunction.  Moderate pulmonary hypertension  Antimicrobial therapy: Unasyn: 5/25>>  Microbiology data: 5/24>> urine culture: E. Coli 5/24>> blood culture: No growth  Procedures : None  Consults: Palliative care, cardiology, trauma, orthopedic  DVT Prophylaxis : apixaban (ELIQUIS) tablet 2.5 mg Start: 02/25/21 1215 SCDs Start: 02/22/21 1448 SCDs Start: 02/22/21 1448 apixaban (ELIQUIS) tablet 2.5 mg    Subjective: Lying comfortably in bed-no major issues overnight.   Assessment/Plan: Numerous right-sided pelvic fractures: Secondary to mechanical fall-evaluated by trauma/orthopedics-nonoperative management recommended-WBAT-follow-up with Dr. [name] in 2-3 weeks.  Pain adequately controlled by scheduled Tylenol-daughter does not want narcotics-hence will discontinue oxycodone.  AKI on CKD stage IIIa: AKI likely hemodynamically mediated-overall improved-back to baseline.  A. fib with RVR: Required Cardizem infusion-rate better controlled-currently on oral Cardizem/metoprolol and Eliquis.  Chronic diastolic heart failure: Volume status stable  CAD: No anginal symptoms  HTN: BP  reasonable-on Cardizem/metoprolol.  Acute hypoxic respiratory failure due to aspiration pneumonia-superimposed on suspected interstitial lung disease: Just 1 L of oxygen today-continue Unasyn-hold off on further dosing of Lasix-reassess on 5/29  E. coli UTI: On IV Unasyn for now-we will plan to switch to a oral agent when closer to discharge.  Normocytic anemia: Hemoglobin stable-follow periodically.  Palliative care: DNR in place-she is incredibly frail-and at risk of further decompensation.  Palliative care following.  Plans are to discharge to SNF when bed available.  Nutrition Problem: Nutrition Problem: Severe Malnutrition Etiology: chronic illness Signs/Symptoms: energy intake < 75% for > or equal to 1 month,moderate fat depletion,severe fat depletion,moderate muscle depletion,severe muscle depletion,percent weight loss Percent weight loss: 8 % Interventions: Ensure Enlive (each supplement provides 350kcal and 20 grams of protein),Magic cup,MVI   Diet: Diet Order            DIET DYS 3 Room service appropriate? Yes; Fluid consistency: Thin  Diet effective now                  Code Status:  DNR  Family Communication: Daughter at bedside   Disposition Plan: Status is: Inpatient  Remains inpatient appropriate because:Inpatient level of care appropriate due to severity of illness   Dispo: The patient is from: Home              Anticipated d/c is to: SNF              Patient currently is medically stable to d/c.   Difficult to place patient No    Barriers to Discharge: Awaiting SNF bed  Antimicrobial agents: Anti-infectives (From admission, onward)   Start     Dose/Rate Route Frequency Ordered Stop   02/24/21 1100  Ampicillin-Sulbactam (UNASYN) 3 g in sodium chloride 0.9 % 100 mL IVPB        3 g 200 mL/hr over 30 Minutes Intravenous Every 12 hours 02/24/21 0928 02/28/21 2359       Time spent: 25- minutes-Greater than 50% of this time was spent in  counseling, explanation of diagnosis, planning of further management, and coordination of care.  MEDICATIONS: Scheduled Meds: . acetaminophen  1,000 mg Oral TID  . apixaban  2.5 mg Oral BID  . brimonidine  1 drop Right Eye BID  . diltiazem  240 mg Oral Daily  . dorzolamide-timolol  1 drop Both Eyes BID  . feeding supplement  237 mL Oral Q24H  . latanoprost  1 drop Both Eyes Q1200  . mouth rinse  15 mL Mouth Rinse BID  . metoprolol succinate  50 mg Oral QHS   Continuous Infusions: . ampicillin-sulbactam (UNASYN) IV 3 g (02/28/21 1136)   PRN Meds:.   PHYSICAL EXAM: Vital signs: Vitals:   02/27/21 1948 02/27/21 2312 02/28/21 0349 02/28/21 0813  BP: (!) 142/76 128/73 139/82 132/78  Pulse: 91 (!) 101 87 98  Resp: 18 20 20 20   Temp: 97.7 F (36.5 C) 97.6 F (36.4 C) 97.6 F (36.4 C) 98 F (36.7 C)  TempSrc: Oral Oral Oral Oral  SpO2: 97% 98% 96% 99%  Weight:      Height:       Filed Weights   02/23/21 0358 02/24/21 0336 02/25/21 0334  Weight: 48.4 kg 50.7 kg 50.5 kg   Body mass index is 21.74 kg/m.   Gen Exam:Alert awake-not in any distress HEENT:atraumatic, normocephalic Chest: B/L clear to auscultation anteriorly CVS:S1S2 regular Abdomen:soft non tender, non distended Extremities:no edema Neurology: Non focal Skin: no rash  I have personally reviewed following labs and imaging studies  LABORATORY DATA: CBC: Recent Labs  Lab 02/22/21 1334 02/22/21 1700 02/23/21 0212 02/24/21 0417 02/25/21 0037 02/26/21 0052 02/28/21 0102  WBC 17.9*  --  15.8* 21.2* 10.5 11.7* 9.0  NEUTROABS 16.0*  --   --   --   --   --   --   HGB 14.1   < > 13.0 10.7* 9.5* 10.0* 9.2*  HCT 44.5   < > 40.5 32.7* 29.7* 31.3* 28.3*  MCV 87.9  --  86.7 86.7 86.8 87.4 86.8  PLT 226  --  189 144* PLATELET CLUMPS NOTED ON SMEAR, COUNT APPEARS DECREASED 123* 145*   < > = values in this interval not displayed.    Basic Metabolic Panel: Recent Labs  Lab 02/24/21 0417 02/25/21 0037  02/26/21 0052 02/27/21 0146 02/28/21 0102  NA 136 137 137 139 139  K 3.9 3.4* 4.1 3.5 3.6  CL 103 106 105 105 107  CO2 25 24 23 27 25   GLUCOSE 130* 117* 116* 108* 111*  BUN 47* 39* 28* 25* 25*  CREATININE 2.03* 1.61* 1.16* 1.19* 1.05*  CALCIUM 8.0* 8.0* 8.3* 8.4* 8.6*    GFR: Estimated Creatinine Clearance: 26.6 mL/min (A) (by C-G formula based on SCr of 1.05 mg/dL (H)).  Liver Function Tests: No results for input(s): AST, ALT, ALKPHOS, BILITOT, PROT, ALBUMIN in the last 168 hours. No results for input(s): LIPASE, AMYLASE in the last 168 hours. No results for input(s): AMMONIA in the last 168 hours.  Coagulation Profile: No results for input(s): INR, PROTIME in the last 168 hours.  Cardiac Enzymes: No results for input(s): CKTOTAL, CKMB, CKMBINDEX, TROPONINI in the last 168 hours.  BNP (last 3  results) No results for input(s): PROBNP in the last 8760 hours.  Lipid Profile: No results for input(s): CHOL, HDL, LDLCALC, TRIG, CHOLHDL, LDLDIRECT in the last 72 hours.  Thyroid Function Tests: No results for input(s): TSH, T4TOTAL, FREET4, T3FREE, THYROIDAB in the last 72 hours.  Anemia Panel: No results for input(s): VITAMINB12, FOLATE, FERRITIN, TIBC, IRON, RETICCTPCT in the last 72 hours.  Urine analysis:    Component Value Date/Time   COLORURINE YELLOW 02/24/2021 1443   APPEARANCEUR HAZY (A) 02/24/2021 1443   LABSPEC 1.014 02/24/2021 1443   PHURINE 6.0 02/24/2021 1443   GLUCOSEU NEGATIVE 02/24/2021 1443   HGBUR NEGATIVE 02/24/2021 1443   BILIRUBINUR NEGATIVE 02/24/2021 1443   KETONESUR NEGATIVE 02/24/2021 1443   PROTEINUR NEGATIVE 02/24/2021 1443   NITRITE NEGATIVE 02/24/2021 1443   LEUKOCYTESUR LARGE (A) 02/24/2021 1443    Sepsis Labs: Lactic Acid, Venous    Component Value Date/Time   LATICACIDVEN 0.7 04/15/2020 2055    MICROBIOLOGY: Recent Results (from the past 240 hour(s))  Resp Panel by RT-PCR (Flu A&B, Covid) Nasopharyngeal Swab     Status: None    Collection Time: 02/22/21  2:50 PM   Specimen: Nasopharyngeal Swab; Nasopharyngeal(NP) swabs in vial transport medium  Result Value Ref Range Status   SARS Coronavirus 2 by RT PCR NEGATIVE NEGATIVE Final    Comment: (NOTE) SARS-CoV-2 target nucleic acids are NOT DETECTED.  The SARS-CoV-2 RNA is generally detectable in upper respiratory specimens during the acute phase of infection. The lowest concentration of SARS-CoV-2 viral copies this assay can detect is 138 copies/mL. A negative result does not preclude SARS-Cov-2 infection and should not be used as the sole basis for treatment or other patient management decisions. A negative result may occur with  improper specimen collection/handling, submission of specimen other than nasopharyngeal swab, presence of viral mutation(s) within the areas targeted by this assay, and inadequate number of viral copies(<138 copies/mL). A negative result must be combined with clinical observations, patient history, and epidemiological information. The expected result is Negative.  Fact Sheet for Patients:  EntrepreneurPulse.com.au  Fact Sheet for Healthcare Providers:  IncredibleEmployment.be  This test is no t yet approved or cleared by the [place] FDA and  has been authorized for detection and/or diagnosis of SARS-CoV-2 by FDA under an Emergency Use Authorization (EUA). This EUA will remain  in effect (meaning this test can be used) for the duration of the COVID-19 declaration under Section 564(b)(1) of the Act, 21 U.S.C.section 360bbb-3(b)(1), unless the authorization is terminated  or revoked sooner.       Influenza A by PCR NEGATIVE NEGATIVE Final   Influenza B by PCR NEGATIVE NEGATIVE Final    Comment: (NOTE) The Xpert Xpress SARS-CoV-2/FLU/RSV plus assay is intended as an aid in the diagnosis of influenza from Nasopharyngeal swab specimens and should not be used as a sole basis for treatment.  Nasal washings and aspirates are unacceptable for Xpert Xpress SARS-CoV-2/FLU/RSV testing.  Fact Sheet for Patients: EntrepreneurPulse.com.au  Fact Sheet for Healthcare Providers: IncredibleEmployment.be  This test is not yet approved or cleared by the [place] FDA and has been authorized for detection and/or diagnosis of SARS-CoV-2 by FDA under an Emergency Use Authorization (EUA). This EUA will remain in effect (meaning this test can be used) for the duration of the COVID-19 declaration under Section 564(b)(1) of the Act, 21 U.S.C. section 360bbb-3(b)(1), unless the authorization is terminated or revoked.  Performed at [facility], [place] [address], [place], [place] [address]  Culture, blood (Routine X 2) w Reflex to ID Panel     Status: None (Preliminary result)   Collection Time: 02/24/21  8:47 AM   Specimen: BLOOD RIGHT HAND  Result Value Ref Range Status   Specimen Description BLOOD RIGHT HAND  Final   Special Requests   Final    BOTTLES DRAWN AEROBIC AND ANAEROBIC Blood Culture adequate volume   Culture   Final    NO GROWTH 4 DAYS Performed at [facility], [address], [place], [place] [address]    Report Status PENDING  Incomplete  Culture, blood (Routine X 2) w Reflex to ID Panel     Status: None (Preliminary result)   Collection Time: 02/24/21  8:47 AM   Specimen: BLOOD RIGHT HAND  Result Value Ref Range Status   Specimen Description BLOOD RIGHT HAND  Final   Special Requests   Final    BOTTLES DRAWN AEROBIC AND ANAEROBIC Blood Culture adequate volume   Culture   Final    NO GROWTH 4 DAYS Performed at [facility], [place] [address], [place], [place] [address]    Report Status PENDING  Incomplete  Culture, Urine     Status: Abnormal   Collection Time: 02/24/21  3:16 PM   Specimen: Urine, Random  Result Value Ref Range Status   Specimen Description URINE, RANDOM  Final   Special Requests    Final    NONE Performed at [facility], [place] [address], [place], [place] [address]    Culture 30,000 COLONIES/mL ESCHERICHIA COLI (A)  Final   Report Status 02/27/2021 FINAL  Final   Organism ID, Bacteria ESCHERICHIA COLI (A)  Final      Susceptibility   Escherichia coli - MIC*    AMPICILLIN <=2 SENSITIVE Sensitive     CEFAZOLIN <=4 SENSITIVE Sensitive     CEFEPIME <=0.12 SENSITIVE Sensitive     CEFTRIAXONE <=0.25 SENSITIVE Sensitive     CIPROFLOXACIN <=0.25 SENSITIVE Sensitive     GENTAMICIN <=1 SENSITIVE Sensitive     IMIPENEM <=0.25 SENSITIVE Sensitive     NITROFURANTOIN <=16 SENSITIVE Sensitive     TRIMETH/SULFA >=320 RESISTANT Resistant     AMPICILLIN/SULBACTAM <=2 SENSITIVE Sensitive     PIP/TAZO <=4 SENSITIVE Sensitive     * 30,000 COLONIES/mL ESCHERICHIA COLI    RADIOLOGY STUDIES/RESULTS: No results found.   LOS: 6 days   Oren Binet, MD  [facility]    To contact the attending provider between 7A-7P or the covering provider during after hours 7P-7A, please log into the web site [email or web address] and access using universal [place] password for that web site. If you do not have the password, please call the hospital operator.  02/28/2021, 12:15 PM

## 2021-02-28 NOTE — Plan of Care (Signed)
  Problem: Education: Goal: Knowledge of General Education information will improve Description Including pain rating scale, medication(s)/side effects and non-pharmacologic comfort measures Outcome: Progressing   

## 2021-03-01 DIAGNOSIS — S72009A Fracture of unspecified part of neck of unspecified femur, initial encounter for closed fracture: Secondary | ICD-10-CM | POA: Diagnosis not present

## 2021-03-01 DIAGNOSIS — N1831 Chronic kidney disease, stage 3a: Secondary | ICD-10-CM | POA: Diagnosis not present

## 2021-03-01 DIAGNOSIS — I4891 Unspecified atrial fibrillation: Secondary | ICD-10-CM | POA: Diagnosis not present

## 2021-03-01 DIAGNOSIS — J9601 Acute respiratory failure with hypoxia: Secondary | ICD-10-CM | POA: Diagnosis not present

## 2021-03-01 LAB — CULTURE, BLOOD (ROUTINE X 2)
Culture: NO GROWTH
Culture: NO GROWTH
Special Requests: ADEQUATE
Special Requests: ADEQUATE

## 2021-03-01 MED ORDER — TORSEMIDE 20 MG PO TABS
20.0000 mg | ORAL_TABLET | Freq: Every day | ORAL | Status: DC
  Administered 2021-03-02 – 2021-03-03 (×2): 20 mg via ORAL
  Filled 2021-03-01 (×2): qty 1

## 2021-03-01 NOTE — Progress Notes (Signed)
PROGRESS NOTE        PATIENT DETAILS Name: Kelli Peterson Age: 87 y.o. Sex: female Date of Birth: 09/11/1930 Admit Date: 02/22/2021 Admitting Physician Harold Hedge, MD QQV:[id number], Hinton Dyer, DO  Brief Narrative: Patient is a 87 y.o. female with history of A. fib, CAD, CKD stage IIIa, HTN, HLD, history of DVT, chronic diastolic heart failure-presented to the hospital following a mechanical fall-she was found to have numerous pelvic fractures.  Hospital course complicated by A. fib with RVR, AKI, acute hypoxic respiratory failure due to suspected aspiration pneumonia.  See below for further details.   Significant studies: 5/22>> CT pelvis: Numerous fractures of the right hemipelvis. 5/22>> CT head: No acute abnormalities. 5/24>>Echo: EF 64-33%, grade 3 diastolic dysfunction.  Moderate pulmonary hypertension  Antimicrobial therapy: Unasyn: 5/24>>5/28  Microbiology data: 5/24>> urine culture: E. Coli 5/24>> blood culture: No growth  Procedures : None  Consults: Palliative care, cardiology, trauma, orthopedic  DVT Prophylaxis : apixaban (ELIQUIS) tablet 2.5 mg Start: 02/25/21 1215 SCDs Start: 02/22/21 1448 SCDs Start: 02/22/21 1448 apixaban (ELIQUIS) tablet 2.5 mg    Subjective: Lying comfortably in bed-daughter at bedside.  She is awake and alert.   Assessment/Plan: Numerous right-sided pelvic fractures: Secondary to mechanical fall-evaluated by trauma/orthopedics-nonoperative management recommended-WBAT-follow-up with Dr. [name] in 2-3 weeks.  Pain adequately controlled by scheduled Tylenol.  No longer on narcotics.  AKI on CKD stage IIIa: AKI likely hemodynamically mediated-overall improved-back to baseline.  A. fib with RVR: Required Cardizem infusion-rate better controlled-currently on oral Cardizem/metoprolol and Eliquis.  Chronic diastolic heart failure: Volume status stable-we will resume Demadex at half of home dose.  CAD: No anginal  symptoms  HTN: BP reasonable-on Cardizem/metoprolol.  Acute hypoxic respiratory failure due to aspiration pneumonia-superimposed on suspected interstitial lung disease: Hypoxia has improved-she was titrated on room air today.  Completed Unasyn on 5/28.  E. coli UTI: Completed a 5-day course of Unasyn on 5/28.    Normocytic anemia: Hemoglobin stable-follow periodically.  Palliative care: DNR in place-she is incredibly frail-and at risk of further decompensation.  Palliative care following.  Plans are to discharge to SNF when bed available.  Nutrition Problem: Nutrition Problem: Severe Malnutrition Etiology: chronic illness Signs/Symptoms: energy intake < 75% for > or equal to 1 month,moderate fat depletion,severe fat depletion,moderate muscle depletion,severe muscle depletion,percent weight loss Percent weight loss: 8 % Interventions: Ensure Enlive (each supplement provides 350kcal and 20 grams of protein),Magic cup,MVI   Diet: Diet Order            DIET DYS 3 Room service appropriate? Yes; Fluid consistency: Thin  Diet effective now                  Code Status:  DNR  Family Communication: Daughter at bedside   Disposition Plan: Status is: Inpatient  Remains inpatient appropriate because:Inpatient level of care appropriate due to severity of illness   Dispo: The patient is from: Home              Anticipated d/c is to: SNF              Patient currently is medically stable to d/c.   Difficult to place patient No    Barriers to Discharge: Awaiting SNF bed  Antimicrobial agents: Anti-infectives (From admission, onward)   Start     Dose/Rate Route Frequency Ordered Stop  02/24/21 1100  Ampicillin-Sulbactam (UNASYN) 3 g in sodium chloride 0.9 % 100 mL IVPB        3 g 200 mL/hr over 30 Minutes Intravenous Every 12 hours 02/24/21 0928 02/28/21 2334       Time spent: 25- minutes-Greater than 50% of this time was spent in counseling, explanation of diagnosis,  planning of further management, and coordination of care.  MEDICATIONS: Scheduled Meds: . acetaminophen  1,000 mg Oral TID  . apixaban  2.5 mg Oral BID  . brimonidine  1 drop Right Eye BID  . diltiazem  240 mg Oral Daily  . dorzolamide-timolol  1 drop Both Eyes BID  . feeding supplement  237 mL Oral Q24H  . latanoprost  1 drop Both Eyes Q1200  . mouth rinse  15 mL Mouth Rinse BID  . metoprolol succinate  50 mg Oral QHS   Continuous Infusions:  PRN Meds:.   PHYSICAL EXAM: Vital signs: Vitals:   02/28/21 2349 03/01/21 0507 03/01/21 0803 03/01/21 1252  BP: 137/74  (!) 150/108 (!) 130/96  Pulse: 95  93 87  Resp: 20  20 20   Temp: 97.6 F (36.4 C) 97.7 F (36.5 C)  (!) 97.4 F (36.3 C)  TempSrc: Oral Oral Oral Oral  SpO2: 100%  97% 95%  Weight:      Height:       Filed Weights   02/23/21 0358 02/24/21 0336 02/25/21 0334  Weight: 48.4 kg 50.7 kg 50.5 kg   Body mass index is 21.74 kg/m.   Gen Exam:Alert awake-not in any distress HEENT:atraumatic, normocephalic Chest: B/L clear to auscultation anteriorly CVS:S1S2 regular Abdomen:soft non tender, non distended Extremities:no edema Neurology: Non focal Skin: no rash  I have personally reviewed following labs and imaging studies  LABORATORY DATA: CBC: Recent Labs  Lab 02/23/21 0212 02/24/21 0417 02/25/21 0037 02/26/21 0052 02/28/21 0102  WBC 15.8* 21.2* 10.5 11.7* 9.0  HGB 13.0 10.7* 9.5* 10.0* 9.2*  HCT 40.5 32.7* 29.7* 31.3* 28.3*  MCV 86.7 86.7 86.8 87.4 86.8  PLT 189 144* PLATELET CLUMPS NOTED ON SMEAR, COUNT APPEARS DECREASED 123* 145*    Basic Metabolic Panel: Recent Labs  Lab 02/24/21 0417 02/25/21 0037 02/26/21 0052 02/27/21 0146 02/28/21 0102  NA 136 137 137 139 139  K 3.9 3.4* 4.1 3.5 3.6  CL 103 106 105 105 107  CO2 25 24 23 27 25   GLUCOSE 130* 117* 116* 108* 111*  BUN 47* 39* 28* 25* 25*  CREATININE 2.03* 1.61* 1.16* 1.19* 1.05*  CALCIUM 8.0* 8.0* 8.3* 8.4* 8.6*    GFR: Estimated  Creatinine Clearance: 26.6 mL/min (A) (by C-G formula based on SCr of 1.05 mg/dL (H)).  Liver Function Tests: No results for input(s): AST, ALT, ALKPHOS, BILITOT, PROT, ALBUMIN in the last 168 hours. No results for input(s): LIPASE, AMYLASE in the last 168 hours. No results for input(s): AMMONIA in the last 168 hours.  Coagulation Profile: No results for input(s): INR, PROTIME in the last 168 hours.  Cardiac Enzymes: No results for input(s): CKTOTAL, CKMB, CKMBINDEX, TROPONINI in the last 168 hours.  BNP (last 3 results) No results for input(s): PROBNP in the last 8760 hours.  Lipid Profile: No results for input(s): CHOL, HDL, LDLCALC, TRIG, CHOLHDL, LDLDIRECT in the last 72 hours.  Thyroid Function Tests: No results for input(s): TSH, T4TOTAL, FREET4, T3FREE, THYROIDAB in the last 72 hours.  Anemia Panel: No results for input(s): VITAMINB12, FOLATE, FERRITIN, TIBC, IRON, RETICCTPCT in the last 72 hours.  Urine analysis:    Component Value Date/Time   COLORURINE YELLOW 02/24/2021 1443   APPEARANCEUR HAZY (A) 02/24/2021 1443   LABSPEC 1.014 02/24/2021 1443   PHURINE 6.0 02/24/2021 1443   GLUCOSEU NEGATIVE 02/24/2021 1443   HGBUR NEGATIVE 02/24/2021 1443   BILIRUBINUR NEGATIVE 02/24/2021 1443   KETONESUR NEGATIVE 02/24/2021 1443   PROTEINUR NEGATIVE 02/24/2021 1443   NITRITE NEGATIVE 02/24/2021 1443   LEUKOCYTESUR LARGE (A) 02/24/2021 1443    Sepsis Labs: Lactic Acid, Venous    Component Value Date/Time   LATICACIDVEN 0.7 04/15/2020 2055    MICROBIOLOGY: Recent Results (from the past 240 hour(s))  Resp Panel by RT-PCR (Flu A&B, Covid) Nasopharyngeal Swab     Status: None   Collection Time: 02/22/21  2:50 PM   Specimen: Nasopharyngeal Swab; Nasopharyngeal(NP) swabs in vial transport medium  Result Value Ref Range Status   SARS Coronavirus 2 by RT PCR NEGATIVE NEGATIVE Final    Comment: (NOTE) SARS-CoV-2 target nucleic acids are NOT DETECTED.  The SARS-CoV-2 RNA  is generally detectable in upper respiratory specimens during the acute phase of infection. The lowest concentration of SARS-CoV-2 viral copies this assay can detect is 138 copies/mL. A negative result does not preclude SARS-Cov-2 infection and should not be used as the sole basis for treatment or other patient management decisions. A negative result may occur with  improper specimen collection/handling, submission of specimen other than nasopharyngeal swab, presence of viral mutation(s) within the areas targeted by this assay, and inadequate number of viral copies(<138 copies/mL). A negative result must be combined with clinical observations, patient history, and epidemiological information. The expected result is Negative.  Fact Sheet for Patients:  EntrepreneurPulse.com.au  Fact Sheet for Healthcare Providers:  IncredibleEmployment.be  This test is no t yet approved or cleared by the [place] FDA and  has been authorized for detection and/or diagnosis of SARS-CoV-2 by FDA under an Emergency Use Authorization (EUA). This EUA will remain  in effect (meaning this test can be used) for the duration of the COVID-19 declaration under Section 564(b)(1) of the Act, 21 U.S.C.section 360bbb-3(b)(1), unless the authorization is terminated  or revoked sooner.       Influenza A by PCR NEGATIVE NEGATIVE Final   Influenza B by PCR NEGATIVE NEGATIVE Final    Comment: (NOTE) The Xpert Xpress SARS-CoV-2/FLU/RSV plus assay is intended as an aid in the diagnosis of influenza from Nasopharyngeal swab specimens and should not be used as a sole basis for treatment. Nasal washings and aspirates are unacceptable for Xpert Xpress SARS-CoV-2/FLU/RSV testing.  Fact Sheet for Patients: EntrepreneurPulse.com.au  Fact Sheet for Healthcare Providers: IncredibleEmployment.be  This test is not yet approved or cleared by the  [place] FDA and has been authorized for detection and/or diagnosis of SARS-CoV-2 by FDA under an Emergency Use Authorization (EUA). This EUA will remain in effect (meaning this test can be used) for the duration of the COVID-19 declaration under Section 564(b)(1) of the Act, 21 U.S.C. section 360bbb-3(b)(1), unless the authorization is terminated or revoked.  Performed at [facility], [place] [address], [place], [place] [address]   Culture, blood (Routine X 2) w Reflex to ID Panel     Status: None   Collection Time: 02/24/21  8:47 AM   Specimen: BLOOD RIGHT HAND  Result Value Ref Range Status   Specimen Description BLOOD RIGHT HAND  Final   Special Requests   Final    BOTTLES DRAWN AEROBIC AND ANAEROBIC Blood Culture adequate volume  Culture   Final    NO GROWTH 5 DAYS Performed at [facility], [place] [address], [place], [place] [address]    Report Status 03/01/2021 FINAL  Final  Culture, blood (Routine X 2) w Reflex to ID Panel     Status: None   Collection Time: 02/24/21  8:47 AM   Specimen: BLOOD RIGHT HAND  Result Value Ref Range Status   Specimen Description BLOOD RIGHT HAND  Final   Special Requests   Final    BOTTLES DRAWN AEROBIC AND ANAEROBIC Blood Culture adequate volume   Culture   Final    NO GROWTH 5 DAYS Performed at [facility], [place] [address] [place], [place] [address]    Report Status 03/01/2021 FINAL  Final  Culture, Urine     Status: Abnormal   Collection Time: 02/24/21  3:16 PM   Specimen: Urine, Random  Result Value Ref Range Status   Specimen Description URINE, RANDOM  Final   Special Requests   Final    NONE Performed at [facility], [place] [address] [place], [place] [address]    Culture 30,000 COLONIES/mL ESCHERICHIA COLI (A)  Final   Report Status 02/27/2021 FINAL  Final   Organism ID, Bacteria ESCHERICHIA COLI (A)  Final      Susceptibility   Escherichia coli - MIC*    AMPICILLIN <=2 SENSITIVE  Sensitive     CEFAZOLIN <=4 SENSITIVE Sensitive     CEFEPIME <=0.12 SENSITIVE Sensitive     CEFTRIAXONE <=0.25 SENSITIVE Sensitive     CIPROFLOXACIN <=0.25 SENSITIVE Sensitive     GENTAMICIN <=1 SENSITIVE Sensitive     IMIPENEM <=0.25 SENSITIVE Sensitive     NITROFURANTOIN <=16 SENSITIVE Sensitive     TRIMETH/SULFA >=320 RESISTANT Resistant     AMPICILLIN/SULBACTAM <=2 SENSITIVE Sensitive     PIP/TAZO <=4 SENSITIVE Sensitive     * 30,000 COLONIES/mL ESCHERICHIA COLI    RADIOLOGY STUDIES/RESULTS: No results found.   LOS: 7 days   Oren Binet, MD  [facility]    To contact the attending provider between 7A-7P or the covering provider during after hours 7P-7A, please log into the web site [email or web address] and access using universal [place] password for that web site. If you do not have the password, please call the hospital operator.  03/01/2021, 2:10 PM

## 2021-03-02 DIAGNOSIS — S72009A Fracture of unspecified part of neck of unspecified femur, initial encounter for closed fracture: Secondary | ICD-10-CM | POA: Diagnosis not present

## 2021-03-02 NOTE — Progress Notes (Signed)
    Progress Note from the Palliative Medicine Team at [facility]   Patient Name: Kelli Peterson        Date: 03/02/2021 DOB: 07/12/1930  Age: 87 y.o. MRN#: [id number] Attending Physician: Jonetta Osgood, MD Primary Care Physician: [name], DO Admit Date: 02/22/2021   Medical records reviewed.   PT has seen today, improvement in sitting to EOB with decreased assist. Continues to slowly progress. Still recommends SNF/Rehab. Per hospitalist note plans to d/c to SNF when bed available.  This NP visited patient at the bedside as a follow up to previous [place].  Patient was sleeping when I entered, easily arousable. Denies pain, N/V. She states she has been doing bed exercises. Ate 25% of her meal tray. States her daughter was there earlier. Took some sips of water for dry mouth. No other complaints/concerns or questions.  I spoke with the patient's daughter Kelli Peterson.  She states that she was also told that they are just waiting for a facility to accept her.  Kelli Peterson has denied, or looking at a [facility] and [facility].  Anticipated decision on her acceptance and likely transfer tomorrow. She does not have any further questions or concerns at this time.  Discussed with patient the importance of continued conversation with family and their  medical providers regarding overall plan of care and treatment options,  ensuring decisions are within the context of the patients values and GOCs.  Questions and concerns addressed.  Total time spent on the unit was 15 minutes.  Greater than 50% of the time was spent in counseling and coordination of care  Kelli Lessen NP  Palliative Medicine Team Team Phone # [phone] Pager [phone]

## 2021-03-02 NOTE — Progress Notes (Signed)
Physical Therapy Treatment Patient Details Name: Kelli Peterson MRN: [id number] DOB: 1930-08-13 Today's Date: 03/02/2021    History of Present Illness Patient is a 87 y/o female who presents with right pelvic fxs (right inferior pubic ramus, junction of right pubic ramus and acetabulum and iliac wing) s/p fall, also noted to have A-fib with RVR and elevated creatinine. CXR-vascular congestion. PMH includes HTN, CHF, A-fib, cervical ca, CAD, CKD stage III, DVT, AAA.    PT Comments    Pt supine on arrival with flat affect and able to transition to EOB with decreased assistance. Pt continues to struggle with sitting and standing tolerance but able to perform bil LE HEP with increased ease. Encouraged Stedy use with nursing for OOB daily. Will continue to follow.    Follow Up Recommendations  SNF;Supervision for mobility/OOB     Equipment Recommendations  None recommended by PT    Recommendations for Other Services       Precautions / Restrictions Precautions Precautions: Fall Restrictions Weight Bearing Restrictions: Yes RLE Weight Bearing: Partial weight bearing    Mobility  Bed Mobility Overal bed mobility: Needs Assistance Bed Mobility: Supine to Sit     Supine to sit: Mod assist;HOB elevated     General bed mobility comments: HOB 35 degrees with pt able to move bil LE toward EOB with assist of pad to complete pivot and elevate trunk into sitting    Transfers Overall transfer level: Needs assistance   Transfers: Sit to/from Stand;Stand Pivot Transfers Sit to Stand: Min assist Stand pivot transfers: Max assist       General transfer comment: min assist to stand with use of Stedy and use of Stedy to pivot bed to recliner. Pt stood x 2 from bed and Stedy  Ambulation/Gait             General Gait Details: did not attempt as +1 today   Stairs             Wheelchair Mobility    Modified Rankin (Stroke Patients Only)       Balance                                             Cognition Arousal/Alertness: Awake/alert Behavior During Therapy: Flat affect   Area of Impairment: Attention;Problem solving;Safety/judgement;Following commands                 Orientation Level: Disoriented to;Time Current Attention Level: Sustained Memory: Decreased short-term memory Following Commands: Follows one step commands consistently;Follows one step commands with increased time Safety/Judgement: Decreased awareness of safety;Decreased awareness of deficits   Problem Solving: Slow processing;Difficulty sequencing;Requires verbal cues;Requires tactile cues General Comments: pt following simple commands with increased time, requires cueing for sequencing and problem solving. Tendency to have flat affect and limited verbal interaction      Exercises General Exercises - Lower Extremity Heel Slides: AROM;Both;10 reps;Supine Hip ABduction/ADduction: AAROM;Both;10 reps;Seated    General Comments        Pertinent Vitals/Pain Pain Score: 4  Pain Location: right pelvis with sitting EOB initially Pain Descriptors / Indicators: Guarding;Grimacing Pain Intervention(s): Limited activity within patient's tolerance;Monitored during session;Repositioned    Home Living                      Prior Function  PT Goals (current goals can now be found in the care plan section) Progress towards PT goals: Progressing toward goals    Frequency    Min 3X/week      PT Plan Current plan remains appropriate    Co-evaluation              AM-PAC PT "6 Clicks" Mobility   Outcome Measure  Help needed turning from your back to your side while in a flat bed without using bedrails?: A Lot Help needed moving from lying on your back to sitting on the side of a flat bed without using bedrails?: A Lot Help needed moving to and from a bed to a chair (including a wheelchair)?: A Lot Help needed standing up  from a chair using your arms (e.g., wheelchair or bedside chair)?: A Lot Help needed to walk in hospital room?: Total Help needed climbing 3-5 steps with a railing? : Total 6 Click Score: 10    End of Session Equipment Utilized During Treatment: Gait belt Activity Tolerance: Patient tolerated treatment well Patient left: in chair;with call bell/phone within reach;with chair alarm set Nurse Communication: Mobility status;Need for lift equipment PT Visit Diagnosis: Other abnormalities of gait and mobility (R26.89);Muscle weakness (generalized) (M62.81);Pain     Time: 7564-3329 PT Time Calculation (min) (ACUTE ONLY): 17 min  Charges:  $Therapeutic Activity: 8-22 mins                     Vergil Burby P, PT Acute Rehabilitation Services Pager: [phone] Office: [place] 03/02/2021, 1:50 PM

## 2021-03-02 NOTE — Progress Notes (Signed)
PROGRESS NOTE        PATIENT DETAILS Name: Kelli Peterson Age: 87 y.o. Sex: female Date of Birth: 19-Oct-1929 Admit Date: 02/22/2021 Admitting Physician Harold Hedge, MD UDJ:[id number], Hinton Dyer, DO  Brief Narrative: Patient is a 87 y.o. female with history of A. fib, CAD, CKD stage IIIa, HTN, HLD, history of DVT, chronic diastolic heart failure-presented to the hospital following a mechanical fall-she was found to have numerous pelvic fractures.  Hospital course complicated by A. fib with RVR, AKI, acute hypoxic respiratory failure due to suspected aspiration pneumonia.  See below for further details.   Significant studies: 5/22>> CT pelvis: Numerous fractures of the right hemipelvis. 5/22>> CT head: No acute abnormalities. 5/24>>Echo: EF 78-58%, grade 3 diastolic dysfunction.  Moderate pulmonary hypertension  Antimicrobial therapy: Unasyn: 5/24>>5/28  Microbiology data: 5/24>> urine culture: E. Coli 5/24>> blood culture: No growth  Procedures : None  Consults: Palliative care, cardiology, trauma, orthopedic  DVT Prophylaxis : apixaban (ELIQUIS) tablet 2.5 mg Start: 02/25/21 1215 SCDs Start: 02/22/21 1448 SCDs Start: 02/22/21 1448 apixaban (ELIQUIS) tablet 2.5 mg    Subjective: No major issues overnight-daughter at bedside-patient denies any chest pain or shortness of breath.  On room air.   Assessment/Plan: Numerous right-sided pelvic fractures: Secondary to mechanical fall-evaluated by trauma/orthopedics-nonoperative management recommended-WBAT-follow-up with Dr. [name] in 2-3 weeks.  Pain adequately controlled by scheduled Tylenol.  No longer on narcotics.  AKI on CKD stage IIIa: AKI likely hemodynamically mediated-overall improved-back to baseline.  A. fib with RVR: Required Cardizem infusion-rate better controlled-currently on oral Cardizem/metoprolol and Eliquis.  Chronic diastolic heart failure: Volume status stable-we will resume Demadex  at half of home dose.  CAD: No anginal symptoms  HTN: BP reasonable-on Cardizem/metoprolol.  Acute hypoxic respiratory failure due to aspiration pneumonia-superimposed on suspected interstitial lung disease: Hypoxia has improved-she was titrated on room air today.  Completed Unasyn on 5/28.  E. coli UTI: Completed a 5-day course of Unasyn on 5/28.    Normocytic anemia: Hemoglobin stable-follow periodically.  Palliative care: DNR in place-she is incredibly frail-and at risk of further decompensation.  Palliative care following.  Plans are to discharge to SNF when bed available.  Nutrition Problem: Nutrition Problem: Severe Malnutrition Etiology: chronic illness Signs/Symptoms: energy intake < 75% for > or equal to 1 month,moderate fat depletion,severe fat depletion,moderate muscle depletion,severe muscle depletion,percent weight loss Percent weight loss: 8 % Interventions: Ensure Enlive (each supplement provides 350kcal and 20 grams of protein),Magic cup,MVI   Diet: Diet Order            DIET DYS 3 Room service appropriate? Yes; Fluid consistency: Thin  Diet effective now                  Code Status:  DNR  Family Communication: Daughter at bedside   Disposition Plan: Status is: Inpatient  Remains inpatient appropriate because:Inpatient level of care appropriate due to severity of illness   Dispo: The patient is from: Home              Anticipated d/c is to: SNF              Patient currently is medically stable to d/c.   Difficult to place patient No    Barriers to Discharge: Awaiting SNF bed  Antimicrobial agents: Anti-infectives (From admission, onward)   Start     Dose/Rate  Route Frequency Ordered Stop   02/24/21 1100  Ampicillin-Sulbactam (UNASYN) 3 g in sodium chloride 0.9 % 100 mL IVPB        3 g 200 mL/hr over 30 Minutes Intravenous Every 12 hours 02/24/21 0928 02/28/21 2334       Time spent: 15- minutes-Greater than 50% of this time was spent in  counseling, explanation of diagnosis, planning of further management, and coordination of care.  MEDICATIONS: Scheduled Meds: . acetaminophen  1,000 mg Oral TID  . apixaban  2.5 mg Oral BID  . brimonidine  1 drop Right Eye BID  . diltiazem  240 mg Oral Daily  . dorzolamide-timolol  1 drop Both Eyes BID  . feeding supplement  237 mL Oral Q24H  . latanoprost  1 drop Both Eyes Q1200  . mouth rinse  15 mL Mouth Rinse BID  . metoprolol succinate  50 mg Oral QHS  . torsemide  20 mg Oral Daily   Continuous Infusions:  PRN Meds:.   PHYSICAL EXAM: Vital signs: Vitals:   03/02/21 0000 03/02/21 0400 03/02/21 0730 03/02/21 1015  BP: (!) 144/83 (!) 163/88 (!) 151/92 (!) 152/99  Pulse: 99 (!) 103 91   Resp: 18 16 20    Temp: 98.2 F (36.8 C) 98.5 F (36.9 C) 97.9 F (36.6 C)   TempSrc: Oral Oral Oral   SpO2: 96% 98% 97%   Weight:      Height:       Filed Weights   02/23/21 0358 02/24/21 0336 02/25/21 0334  Weight: 48.4 kg 50.7 kg 50.5 kg   Body mass index is 21.74 kg/m.   Gen Exam:Alert awake-not in any distress HEENT:atraumatic, normocephalic Chest: B/L clear to auscultation anteriorly CVS:S1S2 regular Abdomen:soft non tender, non distended Extremities:no edema Neurology: Non focal Skin: no rash  I have personally reviewed following labs and imaging studies  LABORATORY DATA: CBC: Recent Labs  Lab 02/24/21 0417 02/25/21 0037 02/26/21 0052 02/28/21 0102  WBC 21.2* 10.5 11.7* 9.0  HGB 10.7* 9.5* 10.0* 9.2*  HCT 32.7* 29.7* 31.3* 28.3*  MCV 86.7 86.8 87.4 86.8  PLT 144* PLATELET CLUMPS NOTED ON SMEAR, COUNT APPEARS DECREASED 123* 145*    Basic Metabolic Panel: Recent Labs  Lab 02/24/21 0417 02/25/21 0037 02/26/21 0052 02/27/21 0146 02/28/21 0102  NA 136 137 137 139 139  K 3.9 3.4* 4.1 3.5 3.6  CL 103 106 105 105 107  CO2 25 24 23 27 25   GLUCOSE 130* 117* 116* 108* 111*  BUN 47* 39* 28* 25* 25*  CREATININE 2.03* 1.61* 1.16* 1.19* 1.05*  CALCIUM 8.0*  8.0* 8.3* 8.4* 8.6*    GFR: Estimated Creatinine Clearance: 26.6 mL/min (A) (by C-G formula based on SCr of 1.05 mg/dL (H)).  Liver Function Tests: No results for input(s): AST, ALT, ALKPHOS, BILITOT, PROT, ALBUMIN in the last 168 hours. No results for input(s): LIPASE, AMYLASE in the last 168 hours. No results for input(s): AMMONIA in the last 168 hours.  Coagulation Profile: No results for input(s): INR, PROTIME in the last 168 hours.  Cardiac Enzymes: No results for input(s): CKTOTAL, CKMB, CKMBINDEX, TROPONINI in the last 168 hours.  BNP (last 3 results) No results for input(s): PROBNP in the last 8760 hours.  Lipid Profile: No results for input(s): CHOL, HDL, LDLCALC, TRIG, CHOLHDL, LDLDIRECT in the last 72 hours.  Thyroid Function Tests: No results for input(s): TSH, T4TOTAL, FREET4, T3FREE, THYROIDAB in the last 72 hours.  Anemia Panel: No results for input(s): VITAMINB12, FOLATE, FERRITIN,  TIBC, IRON, RETICCTPCT in the last 72 hours.  Urine analysis:    Component Value Date/Time   COLORURINE YELLOW 02/24/2021 1443   APPEARANCEUR HAZY (A) 02/24/2021 1443   LABSPEC 1.014 02/24/2021 1443   PHURINE 6.0 02/24/2021 1443   GLUCOSEU NEGATIVE 02/24/2021 1443   HGBUR NEGATIVE 02/24/2021 1443   BILIRUBINUR NEGATIVE 02/24/2021 1443   KETONESUR NEGATIVE 02/24/2021 1443   PROTEINUR NEGATIVE 02/24/2021 1443   NITRITE NEGATIVE 02/24/2021 1443   LEUKOCYTESUR LARGE (A) 02/24/2021 1443    Sepsis Labs: Lactic Acid, Venous    Component Value Date/Time   LATICACIDVEN 0.7 04/15/2020 2055    MICROBIOLOGY: Recent Results (from the past 240 hour(s))  Resp Panel by RT-PCR (Flu A&B, Covid) Nasopharyngeal Swab     Status: None   Collection Time: 02/22/21  2:50 PM   Specimen: Nasopharyngeal Swab; Nasopharyngeal(NP) swabs in vial transport medium  Result Value Ref Range Status   SARS Coronavirus 2 by RT PCR NEGATIVE NEGATIVE Final    Comment: (NOTE) SARS-CoV-2 target nucleic  acids are NOT DETECTED.  The SARS-CoV-2 RNA is generally detectable in upper respiratory specimens during the acute phase of infection. The lowest concentration of SARS-CoV-2 viral copies this assay can detect is 138 copies/mL. A negative result does not preclude SARS-Cov-2 infection and should not be used as the sole basis for treatment or other patient management decisions. A negative result may occur with  improper specimen collection/handling, submission of specimen other than nasopharyngeal swab, presence of viral mutation(s) within the areas targeted by this assay, and inadequate number of viral copies(<138 copies/mL). A negative result must be combined with clinical observations, patient history, and epidemiological information. The expected result is Negative.  Fact Sheet for Patients:  EntrepreneurPulse.com.au  Fact Sheet for Healthcare Providers:  IncredibleEmployment.be  This test is no t yet approved or cleared by the [place] FDA and  has been authorized for detection and/or diagnosis of SARS-CoV-2 by FDA under an Emergency Use Authorization (EUA). This EUA will remain  in effect (meaning this test can be used) for the duration of the COVID-19 declaration under Section 564(b)(1) of the Act, 21 U.S.C.section 360bbb-3(b)(1), unless the authorization is terminated  or revoked sooner.       Influenza A by PCR NEGATIVE NEGATIVE Final   Influenza B by PCR NEGATIVE NEGATIVE Final    Comment: (NOTE) The Xpert Xpress SARS-CoV-2/FLU/RSV plus assay is intended as an aid in the diagnosis of influenza from Nasopharyngeal swab specimens and should not be used as a sole basis for treatment. Nasal washings and aspirates are unacceptable for Xpert Xpress SARS-CoV-2/FLU/RSV testing.  Fact Sheet for Patients: EntrepreneurPulse.com.au  Fact Sheet for Healthcare Providers: IncredibleEmployment.be  This  test is not yet approved or cleared by the [place] FDA and has been authorized for detection and/or diagnosis of SARS-CoV-2 by FDA under an Emergency Use Authorization (EUA). This EUA will remain in effect (meaning this test can be used) for the duration of the COVID-19 declaration under Section 564(b)(1) of the Act, 21 U.S.C. section 360bbb-3(b)(1), unless the authorization is terminated or revoked.  Performed at [facility], [place] [address], [place], [place] [address]   Culture, blood (Routine X 2) w Reflex to ID Panel     Status: None   Collection Time: 02/24/21  8:47 AM   Specimen: BLOOD RIGHT HAND  Result Value Ref Range Status   Specimen Description BLOOD RIGHT HAND  Final   Special Requests   Final    BOTTLES  DRAWN AEROBIC AND ANAEROBIC Blood Culture adequate volume   Culture   Final    NO GROWTH 5 DAYS Performed at [facility], [place] [address], [place], [place] [address]    Report Status 03/01/2021 FINAL  Final  Culture, blood (Routine X 2) w Reflex to ID Panel     Status: None   Collection Time: 02/24/21  8:47 AM   Specimen: BLOOD RIGHT HAND  Result Value Ref Range Status   Specimen Description BLOOD RIGHT HAND  Final   Special Requests   Final    BOTTLES DRAWN AEROBIC AND ANAEROBIC Blood Culture adequate volume   Culture   Final    NO GROWTH 5 DAYS Performed at [facility], [place] [address], [place], [place] [address]    Report Status 03/01/2021 FINAL  Final  Culture, Urine     Status: Abnormal   Collection Time: 02/24/21  3:16 PM   Specimen: Urine, Random  Result Value Ref Range Status   Specimen Description URINE, RANDOM  Final   Special Requests   Final    NONE Performed at [facility], [place] [address] [place], [place] [address]    Culture 30,000 COLONIES/mL ESCHERICHIA COLI (A)  Final   Report Status 02/27/2021 FINAL  Final   Organism ID, Bacteria ESCHERICHIA COLI (A)  Final      Susceptibility   Escherichia coli  - MIC*    AMPICILLIN <=2 SENSITIVE Sensitive     CEFAZOLIN <=4 SENSITIVE Sensitive     CEFEPIME <=0.12 SENSITIVE Sensitive     CEFTRIAXONE <=0.25 SENSITIVE Sensitive     CIPROFLOXACIN <=0.25 SENSITIVE Sensitive     GENTAMICIN <=1 SENSITIVE Sensitive     IMIPENEM <=0.25 SENSITIVE Sensitive     NITROFURANTOIN <=16 SENSITIVE Sensitive     TRIMETH/SULFA >=320 RESISTANT Resistant     AMPICILLIN/SULBACTAM <=2 SENSITIVE Sensitive     PIP/TAZO <=4 SENSITIVE Sensitive     * 30,000 COLONIES/mL ESCHERICHIA COLI    RADIOLOGY STUDIES/RESULTS: No results found.   LOS: 8 days   Oren Binet, MD  [facility]    To contact the attending provider between 7A-7P or the covering provider during after hours 7P-7A, please log into the web site [email or web address] and access using universal [place] password for that web site. If you do not have the password, please call the hospital operator.  03/02/2021, 10:52 AM

## 2021-03-02 NOTE — Care Management Important Message (Signed)
Important Message  Patient Details  Name: Kelli Peterson MRN: [id number] Date of Birth: [birth year]   Medicare Important Message Given:  Yes     Shelda Altes 03/02/2021, 9:33 AM

## 2021-03-03 DIAGNOSIS — I4891 Unspecified atrial fibrillation: Secondary | ICD-10-CM | POA: Diagnosis not present

## 2021-03-03 DIAGNOSIS — I2510 Atherosclerotic heart disease of native coronary artery without angina pectoris: Secondary | ICD-10-CM | POA: Diagnosis not present

## 2021-03-03 DIAGNOSIS — N189 Chronic kidney disease, unspecified: Secondary | ICD-10-CM | POA: Diagnosis not present

## 2021-03-03 DIAGNOSIS — I5033 Acute on chronic diastolic (congestive) heart failure: Secondary | ICD-10-CM | POA: Diagnosis not present

## 2021-03-03 DIAGNOSIS — S32501D Unspecified fracture of right pubis, subsequent encounter for fracture with routine healing: Secondary | ICD-10-CM | POA: Diagnosis not present

## 2021-03-03 DIAGNOSIS — J069 Acute upper respiratory infection, unspecified: Secondary | ICD-10-CM | POA: Diagnosis not present

## 2021-03-03 DIAGNOSIS — Z515 Encounter for palliative care: Secondary | ICD-10-CM | POA: Diagnosis not present

## 2021-03-03 DIAGNOSIS — Z743 Need for continuous supervision: Secondary | ICD-10-CM | POA: Diagnosis not present

## 2021-03-03 DIAGNOSIS — S72009A Fracture of unspecified part of neck of unspecified femur, initial encounter for closed fracture: Secondary | ICD-10-CM | POA: Diagnosis not present

## 2021-03-03 DIAGNOSIS — I5032 Chronic diastolic (congestive) heart failure: Secondary | ICD-10-CM | POA: Diagnosis not present

## 2021-03-03 DIAGNOSIS — S32810G Multiple fractures of pelvis with stable disruption of pelvic ring, subsequent encounter for fracture with delayed healing: Secondary | ICD-10-CM | POA: Diagnosis not present

## 2021-03-03 DIAGNOSIS — I10 Essential (primary) hypertension: Secondary | ICD-10-CM | POA: Diagnosis not present

## 2021-03-03 DIAGNOSIS — R0902 Hypoxemia: Secondary | ICD-10-CM | POA: Diagnosis not present

## 2021-03-03 DIAGNOSIS — J849 Interstitial pulmonary disease, unspecified: Secondary | ICD-10-CM | POA: Diagnosis not present

## 2021-03-03 DIAGNOSIS — R262 Difficulty in walking, not elsewhere classified: Secondary | ICD-10-CM | POA: Diagnosis not present

## 2021-03-03 DIAGNOSIS — N1831 Chronic kidney disease, stage 3a: Secondary | ICD-10-CM | POA: Diagnosis not present

## 2021-03-03 DIAGNOSIS — R1312 Dysphagia, oropharyngeal phase: Secondary | ICD-10-CM | POA: Diagnosis not present

## 2021-03-03 DIAGNOSIS — E43 Unspecified severe protein-calorie malnutrition: Secondary | ICD-10-CM | POA: Diagnosis not present

## 2021-03-03 DIAGNOSIS — N179 Acute kidney failure, unspecified: Secondary | ICD-10-CM | POA: Diagnosis not present

## 2021-03-03 DIAGNOSIS — S329XXD Fracture of unspecified parts of lumbosacral spine and pelvis, subsequent encounter for fracture with routine healing: Secondary | ICD-10-CM | POA: Diagnosis not present

## 2021-03-03 DIAGNOSIS — R2681 Unsteadiness on feet: Secondary | ICD-10-CM | POA: Diagnosis not present

## 2021-03-03 DIAGNOSIS — M6281 Muscle weakness (generalized): Secondary | ICD-10-CM | POA: Diagnosis not present

## 2021-03-03 DIAGNOSIS — J9601 Acute respiratory failure with hypoxia: Secondary | ICD-10-CM | POA: Diagnosis not present

## 2021-03-03 LAB — RESP PANEL BY RT-PCR (FLU A&B, COVID) ARPGX2
Influenza A by PCR: NEGATIVE
Influenza B by PCR: NEGATIVE
SARS Coronavirus 2 by RT PCR: NEGATIVE

## 2021-03-03 MED ORDER — COVID-19 MRNA VAC-TRIS(PFIZER) 30 MCG/0.3ML IM SUSP
0.3000 mL | Freq: Once | INTRAMUSCULAR | Status: AC
  Administered 2021-03-03: 0.3 mL via INTRAMUSCULAR
  Filled 2021-03-03: qty 0.3

## 2021-03-03 MED ORDER — TORSEMIDE 20 MG PO TABS: 20.0000 mg | ORAL_TABLET | Freq: Every day | ORAL | 0 refills | Status: DC

## 2021-03-03 MED ORDER — POLYETHYLENE GLYCOL 3350 17 G PO PACK
17.0000 g | PACK | Freq: Every day | ORAL | Status: DC
  Administered 2021-03-03: 17 g via ORAL
  Filled 2021-03-03: qty 1

## 2021-03-03 MED ORDER — POLYETHYLENE GLYCOL 3350 17 G PO PACK: 17.0000 g | PACK | Freq: Every day | ORAL | 0 refills | Status: DC

## 2021-03-03 MED ORDER — ENSURE ENLIVE PO LIQD: 237.0000 mL | ORAL | 12 refills | Status: DC

## 2021-03-03 MED ORDER — METOPROLOL SUCCINATE ER 25 MG PO TB24: 50.0000 mg | ORAL_TABLET | Freq: Every day | ORAL | 0 refills | Status: DC

## 2021-03-03 MED ORDER — ACETAMINOPHEN 500 MG PO TABS: 1000.0000 mg | ORAL_TABLET | Freq: Three times a day (TID) | ORAL | 0 refills | Status: DC

## 2021-03-03 NOTE — TOC Progression Note (Addendum)
Transition of Care [facility]) - Progression Note    Patient Details  Name: Kelli Peterson MRN: [id number] Date of Birth: [birth year]  Transition of Care [facility] CM/SW [place], [place] Phone Number: 03/03/2021, 9:31 AM  Clinical Narrative:     CSW initiated insurance auth.#[id number]  CSW contacted [place]. Awaiting responses.   940: [facility] can accept pt today if [facility] auth is received and pt receives covid booster vaccine and covid test. CSW updated pt daughter and pt. Pt agreeable to receive covid booster shot.   1020: Auth approved  03/03/2021-03/05/2021  Expected Discharge Plan: [place] Barriers to Discharge: Continued Medical Work up  Expected Discharge Plan and Services Expected Discharge Plan: [place] arrangements for the past 2 months: Single Family Home                                       Social Determinants of Health (SDOH) Interventions    Readmission Risk Interventions No flowsheet data found.

## 2021-03-03 NOTE — Progress Notes (Addendum)
Patient d/c'd from 4E to [facility] via [facility]. AVS documentation printed and sent with patient to facility. Attempted to call report. Nurse unavailable.

## 2021-03-03 NOTE — TOC Transition Note (Signed)
Transition of Care [facility]) - CM/SW Discharge Note   Patient Details  Name: Kelli Peterson MRN: [id number] Date of Birth: 07-Mar-1930  Transition of Care [facility]) CM/SW Contact:  Bethann Berkshire, [place] Phone Number: 03/03/2021, 2:23 PM   Clinical Narrative:     Patient will DC to: [facility] Anticipated DC date: 03/03/21 Family notified: Debbe Odea (Daughter)  [phone] (Mobile) Transport by: Corey Harold   Per MD patient ready for DC to No Name. RN, patient, patient's family, and facility notified of DC. Discharge Summary and FL2 sent to facility. RN to call report prior to discharge [phone]). DC packet on chart. Ambulance transport requested for patient.   CSW will sign off for now as social work intervention is no longer needed. Please consult [place] again if new needs arise.   Final next level of care: [place] Barriers to Discharge: No Barriers Identified   Patient Goals and CMS Choice     Choice offered to / list presented to : [place]  Discharge Placement              Patient chooses bed at: [facility] Patient to be transferred to facility by: [place] Name of family member notified: Debbe Odea (Daughter)   [phone] (Mobile) Patient and family notified of of transfer: 03/03/21  Discharge Plan and Services                                     Social Determinants of Health (SDOH) Interventions     Readmission Risk Interventions No flowsheet data found.

## 2021-03-03 NOTE — Discharge Summary (Signed)
PATIENT DETAILS Name: Kelli Peterson Age: 87 y.o. Sex: female Date of Birth: 03-31-1930 MRN: [id number]. Admitting Physician: Harold Hedge, MD [facility]:[id number], Hinton Dyer, DO  Admit Date: 02/22/2021 Discharge date: 03/03/2021  Recommendations for Outpatient Follow-up:  1. Follow up with PCP in 1-2 weeks 2. Please obtain CMP/CBC in one week   Admitted From:  Home  Disposition: SNF   Home Health: No  Equipment/Devices: None  Discharge Condition: Stable  CODE STATUS: DNR  Diet recommendation:  Diet Order            Diet - low sodium heart healthy           DIET DYS 3 Room service appropriate? Yes; Fluid consistency: Thin  Diet effective now                 Brief Narrative: Patient is a 87 y.o. female with history of A. fib, CAD, CKD stage IIIa, HTN, HLD, history of DVT, chronic diastolic heart failure-presented to the hospital following a mechanical fall-she was found to have numerous pelvic fractures.  Hospital course complicated by A. fib with RVR, AKI, acute hypoxic respiratory failure due to suspected aspiration pneumonia.  See below for further details.   Significant studies: 5/22>> CT pelvis: Numerous fractures of the right hemipelvis. 5/22>> CT head: No acute abnormalities. 5/24>>Echo: EF 32-20%, grade 3 diastolic dysfunction.  Moderate pulmonary hypertension  Antimicrobial therapy: Unasyn: 5/24>>5/28  Microbiology data: 5/24>> urine culture: E. Coli 5/24>> blood culture: No growth  Procedures : None  Consults: Palliative care, cardiology, trauma, orthopedic  Brief Hospital Course: Numerous right-sided pelvic fractures: Secondary to mechanical fall-evaluated by trauma/orthopedics-nonoperative management recommended-WBAT-follow-up with Dr. [name] in 2-3 weeks.  Pain adequately controlled by scheduled Tylenol.  No longer on narcotics.  AKI on CKD stage IIIa: AKI likely hemodynamically mediated-overall improved-back to baseline.  A. fib with  RVR: Required Cardizem infusion-rate better controlled-currently on oral Cardizem/metoprolol and Eliquis.  Chronic diastolic heart failure: Volume status stable-tolerating Demadex at half of home dose.  CAD: No anginal symptoms  HTN: BP reasonable-on Cardizem/metoprolol.  Acute hypoxic respiratory failure due to aspiration pneumonia-superimposed on suspected interstitial lung disease: Hypoxia has improved-she was titrated on room air today.  Completed Unasyn on 5/28.  E. coli UTI: Completed a 5-day course of Unasyn on 5/28.    Normocytic anemia: Hemoglobin stable-follow periodically.  Palliative care: DNR in place-she is incredibly frail-and at risk of further decompensation.  Palliative care following.  Plans are to discharge to SNF when bed available.  Nutrition Problem: Nutrition Problem: Severe Malnutrition Etiology: chronic illness Signs/Symptoms: energy intake < 75% for > or equal to 1 month,moderate fat depletion,severe fat depletion,moderate muscle depletion,severe muscle depletion,percent weight loss Percent weight loss: 8 % Interventions: Ensure Enlive (each supplement provides 350kcal and 20 grams of protein),Magic cup,MVI   Procedures None  Discharge Diagnoses:  Principal Problem:   Hip fracture, unspecified laterality, closed, initial encounter ([place]) Active Problems:   CKD (chronic kidney disease), stage III ([place])   Acute hypoxemic respiratory failure ([place])   Atrial fibrillation with rapid ventricular response [facility])   Fall   Discharge Instructions:  Activity:  As tolerated with Full fall precautions use walker/cane & assistance as needed  Discharge Instructions    Diet - low sodium heart healthy   Complete by: As directed    Discharge instructions   Complete by: As directed    Follow with Primary MD  [name], DO in 1-2 weeks  Follow-up with Dr. [name] in 2 weeks  Please get a complete blood count and chemistry panel checked by your  Primary MD at your next visit, and again as instructed by your Primary MD.  Get Medicines reviewed and adjusted: Please take all your medications with you for your next visit with your Primary MD  Laboratory/radiological data: Please request your Primary MD to go over all hospital tests and procedure/radiological results at the follow up, please ask your Primary MD to get all Hospital records sent to his/her office.  In some cases, they will be blood work, cultures and biopsy results pending at the time of your discharge. Please request that your primary care M.D. follows up on these results.  Also Note the following: If you experience worsening of your admission symptoms, develop shortness of breath, life threatening emergency, suicidal or homicidal thoughts you must seek medical attention immediately by calling 911 or calling your MD immediately  if symptoms less severe.  You must read complete instructions/literature along with all the possible adverse reactions/side effects for all the Medicines you take and that have been prescribed to you. Take any new Medicines after you have completely understood and accpet all the possible adverse reactions/side effects.   Do not drive when taking Pain medications or sleeping medications (Benzodaizepines)  Do not take more than prescribed Pain, Sleep and Anxiety Medications. It is not advisable to combine anxiety,sleep and pain medications without talking with your primary care practitioner  Special Instructions: If you have smoked or chewed Tobacco  in the last 2 yrs please stop smoking, stop any regular Alcohol  and or any Recreational drug use.  Wear Seat belts while driving.  Please note: You were cared for by a hospitalist during your hospital stay. Once you are discharged, your primary care physician will handle any further medical issues. Please note that NO REFILLS for any discharge medications will be authorized once you are discharged, as  it is imperative that you return to your primary care physician (or establish a relationship with a primary care physician if you do not have one) for your post hospital discharge needs so that they can reassess your need for medications and monitor your lab values.   Increase activity slowly   Complete by: As directed    No dressing needed   Complete by: As directed      Allergies as of 03/03/2021      Reactions   Penicillins Swelling, Other (See Comments)   Ankles and feet swell   Zocor [simvastatin] Other (See Comments)   Weak and dizzy   Nsaids Other (See Comments)   Tylenol is all that is permitted      Medication List    TAKE these medications   acetaminophen 500 MG tablet Commonly known as: TYLENOL Take 2 tablets (1,000 mg total) by mouth 3 (three) times daily. What changed:   how much to take  when to take this  reasons to take this   apixaban 2.5 MG Tabs tablet Commonly known as: ELIQUIS Take 2.5 mg by mouth 2 (two) times daily.   atorvastatin 20 MG tablet Commonly known as: LIPITOR Take 20 mg by mouth daily.   brimonidine 0.2 % ophthalmic solution Commonly known as: ALPHAGAN Place 1 drop into the right eye 2 (two) times daily.   denosumab 60 MG/ML Sosy injection Commonly known as: PROLIA Inject 60 mg into the skin every 6 (six) months.   diltiazem 240 MG 24 hr capsule Commonly known as: CARDIZEM CD Take 1 capsule (240 mg total)  by mouth daily.   dorzolamide-timolol 22.3-6.8 MG/ML ophthalmic solution Commonly known as: COSOPT Place 1 drop into both eyes 2 (two) times daily.   feeding supplement Liqd Take 237 mLs by mouth daily.   latanoprost 0.005 % ophthalmic solution Commonly known as: XALATAN Place 1 drop into both eyes every evening.   metoprolol succinate 25 MG 24 hr tablet Commonly known as: TOPROL-XL Take 2 tablets (50 mg total) by mouth daily. What changed: how much to take   multivitamin with minerals Tabs tablet Take 1 tablet by  mouth daily.   polyethylene glycol 17 g packet Commonly known as: MIRALAX / GLYCOLAX Take 17 g by mouth daily.   potassium chloride 10 MEQ tablet Commonly known as: KLOR-CON Take 1 tablet (10 mEq total) by mouth daily.   torsemide 20 MG tablet Commonly known as: DEMADEX Take 1 tablet (20 mg total) by mouth daily. What changed: when to take this            Discharge Care Instructions  (From admission, onward)         Start     Ordered   03/03/21 0000  No dressing needed        03/03/21 1108          Follow-up Information    [name], DO. Schedule an appointment as soon as possible for a visit.   Specialty: Family Medicine Contact information: [address] [place] [address] [phone]        [name], MD Follow up in 1 month(s).   Specialty: Cardiology Contact information: [address] [place] [address] [phone]        Shona Needles, MD. Schedule an appointment as soon as possible for a visit in 2 week(s).   Specialty: Orthopedic Surgery Contact information: [place] [address] [phone]              Allergies  Allergen Reactions  . Penicillins Swelling and Other (See Comments)    Ankles and feet swell  . Zocor [Simvastatin] Other (See Comments)    Weak and dizzy  . Nsaids Other (See Comments)    Tylenol is all that is permitted    Other Procedures/Studies: CT Head Wo Contrast  Result Date: 02/22/2021 CLINICAL DATA:  Fall, head trauma EXAM: CT HEAD WITHOUT CONTRAST TECHNIQUE: Contiguous axial images were obtained from the base of the skull through the vertex without intravenous contrast. COMPARISON:  02/07/2018 FINDINGS: Brain: No evidence of acute infarction, hemorrhage, hydrocephalus, extra-axial collection or mass lesion/mass effect. Advanced low-density changes within the periventricular and subcortical white matter compatible with chronic microvascular  ischemic change. Mild diffuse cerebral volume loss. Vascular: Atherosclerotic calcifications involving the large vessels of the skull base. No unexpected hyperdense vessel. Skull: Normal. Negative for fracture or focal lesion. Sinuses/Orbits: No acute finding. Other: Negative for scalp hematoma. IMPRESSION: 1. No acute intracranial findings. 2. Chronic microvascular ischemic change and cerebral volume loss. Electronically Signed   By: Davina Poke D.O.   On: 02/22/2021 14:16   CT PELVIS WO CONTRAST  Result Date: 02/22/2021 CLINICAL DATA:  Unwitnessed fall in a bathroom today. Right hip pain. EXAM: CT PELVIS WITHOUT CONTRAST TECHNIQUE: Multidetector CT imaging of the pelvis was performed following the standard protocol without intravenous contrast. COMPARISON:  Current right hip radiographs. FINDINGS: Musculoskeletal: Right pelvic fractures. There is a nondisplaced, coronal oblique fracture extending across the right ilium from the inferior margin of the SI joint  2 the level of the posterosuperior right acetabulum. Another fracture crosses the pubis at its junction with the medial acetabulum, displaced by 5 mm. A third fracture is noted of the inferior pubic ramus at the level of the abductor muscle origins. No other fractures. No bone lesions. Hip joints, SI joints and pubic symphysis are normally aligned. There is soft tissue hemorrhage at lies medial to the right pubis and anterior medial right acetabulum, mildly indenting the bladder. Urinary Tract: No bladder mass, wall thickening or stone. Visualized ureters normal in course and in caliber. Bowel: Rectum moderately distended with stool. Multiple sigmoid colon diverticula. No bowel wall thickening or inflammation. Vascular/Lymphatic: Aortoiliac atherosclerotic calcifications stable from the previous CT. No enlarged lymph nodes. Reproductive:  Status post hysterectomy.  No pelvic masses. Other:  None. IMPRESSION: 1. Fractures of the right hemipelvis as  detailed above, involving the right ileum, junction of the right pubis and anteromedial acetabulum and of the inferior right pubic ramus. Associated extraperitoneal hemorrhage along the right anterior inferior pelvis. 2. No fracture of the right proximal femur. No dislocation. No bone lesions. Electronically Signed   By: Lajean Manes M.D.   On: 02/22/2021 12:27   DG Chest Port 1 View  Result Date: 02/23/2021 CLINICAL DATA:  Shortness of breath, history CHF, hypertension, atrial fibrillation EXAM: PORTABLE CHEST 1 VIEW COMPARISON:  Portable exam 1824 hours compared to 02/22/2021 FINDINGS: Kyphotic positioning with artifacts project over LEFT chest. Enlargement of cardiac silhouette with slight vascular congestion. Atherosclerotic calcification aorta. Mild perihilar infiltrates likely pulmonary edema. No pleural effusion or pneumothorax. Marked osseous demineralization. IMPRESSION: Enlargement of cardiac silhouette with suspect mild pulmonary edema. Aortic Atherosclerosis (ICD10-I70.0). Electronically Signed   By: Lavonia Dana M.D.   On: 02/23/2021 18:41   DG Chest Port 1 View  Result Date: 02/22/2021 CLINICAL DATA:  Fall EXAM: PORTABLE CHEST 1 VIEW COMPARISON:  04/23/2020 FINDINGS: Cardiomegaly, vascular congestion. No overt edema, confluent opacities or effusions. Aortic atherosclerosis. No acute bony abnormality. IMPRESSION: Cardiomegaly, vascular congestion. Aortic atherosclerosis. Electronically Signed   By: Rolm Baptise M.D.   On: 02/22/2021 14:11   DG Knee Right Port  Result Date: 02/23/2021 CLINICAL DATA:  87 year old with acute knee pain. EXAM: PORTABLE RIGHT KNEE - 1-2 VIEW COMPARISON:  None. FINDINGS: Negative for fracture or dislocation. Extensive osteophytosis in the lateral knee compartment and patellofemoral compartment. Question a small joint effusion. Diffuse vascular calcifications. IMPRESSION: Osteoarthritis in the right knee without acute bone abnormality. Electronically Signed   By:  Markus Daft M.D.   On: 02/23/2021 11:54   ECHOCARDIOGRAM COMPLETE  Result Date: 02/24/2021    ECHOCARDIOGRAM REPORT   Patient Name:   Kelli Peterson Date of Exam: 02/24/2021 Medical Rec #:  [id number]        Height:       60.0 in Accession #:    [id number]       Weight:       111.8 lb Date of Birth:  [birth year]         BSA:          1.458 m Patient Age:    68 years         BP:           101/67 mmHg Patient Gender: F                HR:           95 bpm. Exam Location:  Inpatient Procedure: 2D Echo, Cardiac Doppler and Color  Doppler Indications:    R94.31 Abnormal EKG  History:        Patient has prior history of Echocardiogram examinations, most                 recent 04/16/2020. CHF, Pulmonary HTN, Arrythmias:Atrial                 Fibrillation; Risk Factors:Hypertension and Dyslipidemia. CKD.                 Cancer.  Sonographer:    Jonelle Sidle Dance Referring Phys: [id number] [place] IMPRESSIONS  1. Left ventricular ejection fraction, by estimation, is 55 to 60%. The left ventricle has normal function. The left ventricle has no regional wall motion abnormalities. There is mild left ventricular hypertrophy. Left ventricular diastolic parameters are consistent with Grade III diastolic dysfunction (restrictive).  2. Right ventricular systolic function is normal. The right ventricular size is normal. There is moderately elevated pulmonary artery systolic pressure. The estimated right ventricular systolic pressure is 01.6 mmHg.  3. Left atrial size was severely dilated.  4. Right atrial size was severely dilated.  5. The mitral valve is normal in structure. Mild mitral valve regurgitation. No evidence of mitral stenosis.  6. Tricuspid valve regurgitation is severe.  7. The aortic valve is normal in structure. There is mild calcification of the aortic valve. There is mild thickening of the aortic valve. Aortic valve regurgitation is not visualized. Mild to moderate aortic valve sclerosis/calcification is present, without  any evidence of aortic stenosis.  8. The inferior vena cava is normal in size with greater than 50% respiratory variability, suggesting right atrial pressure of 3 mmHg. FINDINGS  Left Ventricle: Left ventricular ejection fraction, by estimation, is 55 to 60%. The left ventricle has normal function. The left ventricle has no regional wall motion abnormalities. The left ventricular internal cavity size was normal in size. There is  mild left ventricular hypertrophy. Left ventricular diastolic parameters are consistent with Grade III diastolic dysfunction (restrictive). Right Ventricle: The right ventricular size is normal. No increase in right ventricular wall thickness. Right ventricular systolic function is normal. There is moderately elevated pulmonary artery systolic pressure. The tricuspid regurgitant velocity is 3.43 m/s, and with an assumed right atrial pressure of 8 mmHg, the estimated right ventricular systolic pressure is 01.0 mmHg. Left Atrium: Left atrial size was severely dilated. Right Atrium: Right atrial size was severely dilated. Pericardium: There is no evidence of pericardial effusion. Mitral Valve: The mitral valve is normal in structure. Mild mitral valve regurgitation. No evidence of mitral valve stenosis. Tricuspid Valve: The tricuspid valve is normal in structure. Tricuspid valve regurgitation is severe. No evidence of tricuspid stenosis. Aortic Valve: The aortic valve is normal in structure. There is mild calcification of the aortic valve. There is mild thickening of the aortic valve. Aortic valve regurgitation is not visualized. Mild to moderate aortic valve sclerosis/calcification is present, without any evidence of aortic stenosis. Aortic valve mean gradient measures 3.5 mmHg. Aortic valve peak gradient measures 6.8 mmHg. Aortic valve area, by VTI measures 1.09 cm. Pulmonic Valve: The pulmonic valve was normal in structure. Pulmonic valve regurgitation is not visualized. No evidence of  pulmonic stenosis. Aorta: The aortic root is normal in size and structure. Venous: The inferior vena cava is normal in size with greater than 50% respiratory variability, suggesting right atrial pressure of 3 mmHg. IAS/Shunts: No atrial level shunt detected by color flow Doppler.  LEFT VENTRICLE PLAX 2D LVIDd:  3.70 cm LVIDs:         2.90 cm LV PW:         1.30 cm LV IVS:        1.20 cm LVOT diam:     1.70 cm LV SV:         26 LV SV Index:   18 LVOT Area:     2.27 cm  LV Volumes (MOD) LV vol d, MOD A2C: 30.1 ml LV vol d, MOD A4C: 31.1 ml LV vol s, MOD A2C: 11.1 ml LV vol s, MOD A4C: 13.2 ml LV SV MOD A2C:     19.0 ml LV SV MOD A4C:     31.1 ml LV SV MOD BP:      18.2 ml RIGHT VENTRICLE          IVC RV Basal diam:  3.00 cm  IVC diam: 2.20 cm TAPSE (M-mode): 1.0 cm LEFT ATRIUM           Index       RIGHT ATRIUM           Index LA diam:      4.30 cm 2.95 cm/m  RA Area:     28.10 cm LA Vol (A2C): 89.7 ml 61.54 ml/m RA Volume:   93.50 ml  64.14 ml/m LA Vol (A4C): 52.3 ml 35.88 ml/m  AORTIC VALVE AV Area (Vmax):    1.15 cm AV Area (Vmean):   1.08 cm AV Area (VTI):     1.09 cm AV Vmax:           130.00 cm/s AV Vmean:          87.750 cm/s AV VTI:            0.236 m AV Peak Grad:      6.8 mmHg AV Mean Grad:      3.5 mmHg LVOT Vmax:         66.10 cm/s LVOT Vmean:        41.600 cm/s LVOT VTI:          0.113 m LVOT/AV VTI ratio: 0.48  AORTA Ao Root diam: 2.80 cm Ao Asc diam:  3.00 cm MITRAL VALVE                TRICUSPID VALVE MV Area (PHT): 5.42 cm     TR Peak grad:   47.1 mmHg MV Decel Time: 140 msec     TR Vmax:        343.00 cm/s MV E velocity: 127.00 cm/s MV A velocity: 34.90 cm/s   SHUNTS MV E/A ratio:  3.64         Systemic VTI:  0.11 m                             Systemic Diam: 1.70 cm Candee Furbish MD Electronically signed by Candee Furbish MD Signature Date/Time: 02/24/2021/4:34:31 PM    Final    DG Hip Unilat  With Pelvis 2-3 Views Right  Result Date: 02/22/2021 CLINICAL DATA:  Unwitnessed fall.  Right  hip pain. EXAM: DG HIP (WITH OR WITHOUT PELVIS) 2-3V RIGHT COMPARISON:  02/15/2016. FINDINGS: Fracture of the right pubis adjacent to the medial right acetabulum, minimally displaced, approximately 4 mm. There is a fracture of the inferior pubic, without significant displacement. A subtle fracture line is suggested of the right ilium just lateral to the inferior right SI joint. No other fractures.  Specifically,  no right proximal femur fracture. Hip joints, SI joints and pubic symphysis are normally aligned. Skeletal structures are diffusely demineralized. IMPRESSION: 1. Right pelvic fractures. Fracture at the junction of the right pubis with the medial acetabulum, fracture of the inferior right pubic ramus and a questionable fracture of right ilium just lateral to the inferior right SI joint. 2. No right proximal femur fracture.  No dislocation. Electronically Signed   By: Lajean Manes M.D.   On: 02/22/2021 12:08     TODAY-DAY OF DISCHARGE:  Subjective:   Kelli Peterson today has no headache,no chest abdominal pain,no new weakness tingling or numbness, feels much better wants to go home today.   Objective:   Blood pressure 138/71, pulse 100, temperature 97.7 F (36.5 C), temperature source Oral, resp. rate 18, height 5' (1.524 m), weight 50.5 kg, SpO2 94 %.  Intake/Output Summary (Last 24 hours) at 03/03/2021 1108 Last data filed at 03/03/2021 0629 Gross per 24 hour  Intake 480 ml  Output 900 ml  Net -420 ml   Filed Weights   02/23/21 0358 02/24/21 0336 02/25/21 0334  Weight: 48.4 kg 50.7 kg 50.5 kg    Exam: Awake Alert, Oriented *3, No new F.N deficits, Normal affect [place].AT,PERRAL Supple Neck,No JVD, No cervical lymphadenopathy appriciated.  Symmetrical Chest wall movement, Good air movement bilaterally, CTAB RRR,No Gallops,Rubs or new Murmurs, No Parasternal Heave +ve B.Sounds, Abd Soft, Non tender, No organomegaly appriciated, No rebound -guarding or rigidity. No Cyanosis,  Clubbing or edema, No new Rash or bruise   PERTINENT RADIOLOGIC STUDIES: No results found.   PERTINENT LAB RESULTS: CBC: No results for input(s): WBC, HGB, HCT, PLT in the last 72 hours. CMET CMP     Component Value Date/Time   NA 139 02/28/2021 0102   NA 142 02/20/2016 0000   K 3.6 02/28/2021 0102   CL 107 02/28/2021 0102   CO2 25 02/28/2021 0102   GLUCOSE 111 (H) 02/28/2021 0102   BUN 25 (H) 02/28/2021 0102   BUN 15 02/20/2016 0000   CREATININE 1.05 (H) 02/28/2021 0102   CALCIUM 8.6 (L) 02/28/2021 0102   PROT 6.6 04/23/2020 0634   ALBUMIN 2.4 (L) 04/23/2020 0634   AST 50 (H) 04/23/2020 0634   ALT 36 04/23/2020 0634   ALKPHOS 46 04/23/2020 0634   BILITOT 0.4 04/23/2020 0634   GFRNONAA 51 (L) 02/28/2021 0102   GFRAA 53 (L) 04/24/2020 0424    GFR Estimated Creatinine Clearance: 26.6 mL/min (A) (by C-G formula based on SCr of 1.05 mg/dL (H)). No results for input(s): LIPASE, AMYLASE in the last 72 hours. No results for input(s): CKTOTAL, CKMB, CKMBINDEX, TROPONINI in the last 72 hours. Invalid input(s): POCBNP No results for input(s): DDIMER in the last 72 hours. No results for input(s): HGBA1C in the last 72 hours. No results for input(s): CHOL, HDL, LDLCALC, TRIG, CHOLHDL, LDLDIRECT in the last 72 hours. No results for input(s): TSH, T4TOTAL, T3FREE, THYROIDAB in the last 72 hours.  Invalid input(s): FREET3 No results for input(s): VITAMINB12, FOLATE, FERRITIN, TIBC, IRON, RETICCTPCT in the last 72 hours. Coags: No results for input(s): INR in the last 72 hours.  Invalid input(s): PT Microbiology: Recent Results (from the past 240 hour(s))  Resp Panel by RT-PCR (Flu A&B, Covid) Nasopharyngeal Swab     Status: None   Collection Time: 02/22/21  2:50 PM   Specimen: Nasopharyngeal Swab; Nasopharyngeal(NP) swabs in vial transport medium  Result Value Ref Range Status   SARS Coronavirus 2 by RT PCR  NEGATIVE NEGATIVE Final    Comment: (NOTE) SARS-CoV-2 target  nucleic acids are NOT DETECTED.  The SARS-CoV-2 RNA is generally detectable in upper respiratory specimens during the acute phase of infection. The lowest concentration of SARS-CoV-2 viral copies this assay can detect is 138 copies/mL. A negative result does not preclude SARS-Cov-2 infection and should not be used as the sole basis for treatment or other patient management decisions. A negative result may occur with  improper specimen collection/handling, submission of specimen other than nasopharyngeal swab, presence of viral mutation(s) within the areas targeted by this assay, and inadequate number of viral copies(<138 copies/mL). A negative result must be combined with clinical observations, patient history, and epidemiological information. The expected result is Negative.  Fact Sheet for Patients:  EntrepreneurPulse.com.au  Fact Sheet for Healthcare Providers:  IncredibleEmployment.be  This test is no t yet approved or cleared by the [place] FDA and  has been authorized for detection and/or diagnosis of SARS-CoV-2 by FDA under an Emergency Use Authorization (EUA). This EUA will remain  in effect (meaning this test can be used) for the duration of the COVID-19 declaration under Section 564(b)(1) of the Act, 21 U.S.C.section 360bbb-3(b)(1), unless the authorization is terminated  or revoked sooner.       Influenza A by PCR NEGATIVE NEGATIVE Final   Influenza B by PCR NEGATIVE NEGATIVE Final    Comment: (NOTE) The Xpert Xpress SARS-CoV-2/FLU/RSV plus assay is intended as an aid in the diagnosis of influenza from Nasopharyngeal swab specimens and should not be used as a sole basis for treatment. Nasal washings and aspirates are unacceptable for Xpert Xpress SARS-CoV-2/FLU/RSV testing.  Fact Sheet for Patients: EntrepreneurPulse.com.au  Fact Sheet for Healthcare  Providers: IncredibleEmployment.be  This test is not yet approved or cleared by the [place] FDA and has been authorized for detection and/or diagnosis of SARS-CoV-2 by FDA under an Emergency Use Authorization (EUA). This EUA will remain in effect (meaning this test can be used) for the duration of the COVID-19 declaration under Section 564(b)(1) of the Act, 21 U.S.C. section 360bbb-3(b)(1), unless the authorization is terminated or revoked.  Performed at [facility], [place] [address] [place], [place] [address]   Culture, blood (Routine X 2) w Reflex to ID Panel     Status: None   Collection Time: 02/24/21  8:47 AM   Specimen: BLOOD RIGHT HAND  Result Value Ref Range Status   Specimen Description BLOOD RIGHT HAND  Final   Special Requests   Final    BOTTLES DRAWN AEROBIC AND ANAEROBIC Blood Culture adequate volume   Culture   Final    NO GROWTH 5 DAYS Performed at [facility], [place] [address] [place], [place] [address]    Report Status 03/01/2021 FINAL  Final  Culture, blood (Routine X 2) w Reflex to ID Panel     Status: None   Collection Time: 02/24/21  8:47 AM   Specimen: BLOOD RIGHT HAND  Result Value Ref Range Status   Specimen Description BLOOD RIGHT HAND  Final   Special Requests   Final    BOTTLES DRAWN AEROBIC AND ANAEROBIC Blood Culture adequate volume   Culture   Final    NO GROWTH 5 DAYS Performed at [facility], [place] [address], [place], [place] [address]    Report Status 03/01/2021 FINAL  Final  Culture, Urine     Status: Abnormal   Collection Time: 02/24/21  3:16 PM   Specimen: Urine, Random  Result Value Ref Range Status   Specimen Description URINE, RANDOM  Final   Special Requests   Final    NONE Performed at [facility], [address] [place], [place] [address]    Culture 30,000 COLONIES/mL ESCHERICHIA COLI (A)  Final   Report Status 02/27/2021 FINAL  Final   Organism ID, Bacteria  ESCHERICHIA COLI (A)  Final      Susceptibility   Escherichia coli - MIC*    AMPICILLIN <=2 SENSITIVE Sensitive     CEFAZOLIN <=4 SENSITIVE Sensitive     CEFEPIME <=0.12 SENSITIVE Sensitive     CEFTRIAXONE <=0.25 SENSITIVE Sensitive     CIPROFLOXACIN <=0.25 SENSITIVE Sensitive     GENTAMICIN <=1 SENSITIVE Sensitive     IMIPENEM <=0.25 SENSITIVE Sensitive     NITROFURANTOIN <=16 SENSITIVE Sensitive     TRIMETH/SULFA >=320 RESISTANT Resistant     AMPICILLIN/SULBACTAM <=2 SENSITIVE Sensitive     PIP/TAZO <=4 SENSITIVE Sensitive     * 30,000 COLONIES/mL ESCHERICHIA COLI    FURTHER DISCHARGE INSTRUCTIONS:  Get Medicines reviewed and adjusted: Please take all your medications with you for your next visit with your Primary MD  Laboratory/radiological data: Please request your Primary MD to go over all hospital tests and procedure/radiological results at the follow up, please ask your Primary MD to get all Hospital records sent to his/her office.  In some cases, they will be blood work, cultures and biopsy results pending at the time of your discharge. Please request that your primary care M.D. goes through all the records of your hospital data and follows up on these results.  Also Note the following: If you experience worsening of your admission symptoms, develop shortness of breath, life threatening emergency, suicidal or homicidal thoughts you must seek medical attention immediately by calling 911 or calling your MD immediately  if symptoms less severe.  You must read complete instructions/literature along with all the possible adverse reactions/side effects for all the Medicines you take and that have been prescribed to you. Take any new Medicines after you have completely understood and accpet all the possible adverse reactions/side effects.   Do not drive when taking Pain medications or sleeping medications (Benzodaizepines)  Do not take more than prescribed Pain, Sleep and Anxiety  Medications. It is not advisable to combine anxiety,sleep and pain medications without talking with your primary care practitioner  Special Instructions: If you have smoked or chewed Tobacco  in the last 2 yrs please stop smoking, stop any regular Alcohol  and or any Recreational drug use.  Wear Seat belts while driving.  Please note: You were cared for by a hospitalist during your hospital stay. Once you are discharged, your primary care physician will handle any further medical issues. Please note that NO REFILLS for any discharge medications will be authorized once you are discharged, as it is imperative that you return to your primary care physician (or establish a relationship with a primary care physician if you do not have one) for your post hospital discharge needs so that they can reassess your need for medications and monitor your lab values.  Total Time spent coordinating discharge including counseling, education and face to face time equals 35 minutes.  SignedOren Binet 03/03/2021 11:08 AM

## 2021-03-04 ENCOUNTER — Encounter: Payer: Self-pay | Admitting: Internal Medicine

## 2021-03-04 ENCOUNTER — Non-Acute Institutional Stay (SKILLED_NURSING_FACILITY): Payer: Medicare Other | Admitting: Internal Medicine

## 2021-03-04 DIAGNOSIS — J9601 Acute respiratory failure with hypoxia: Secondary | ICD-10-CM | POA: Diagnosis not present

## 2021-03-04 DIAGNOSIS — N189 Chronic kidney disease, unspecified: Secondary | ICD-10-CM

## 2021-03-04 DIAGNOSIS — S32810G Multiple fractures of pelvis with stable disruption of pelvic ring, subsequent encounter for fracture with delayed healing: Secondary | ICD-10-CM

## 2021-03-04 DIAGNOSIS — I5033 Acute on chronic diastolic (congestive) heart failure: Secondary | ICD-10-CM

## 2021-03-04 DIAGNOSIS — N179 Acute kidney failure, unspecified: Secondary | ICD-10-CM

## 2021-03-04 DIAGNOSIS — I4891 Unspecified atrial fibrillation: Secondary | ICD-10-CM | POA: Diagnosis not present

## 2021-03-04 DIAGNOSIS — E43 Unspecified severe protein-calorie malnutrition: Secondary | ICD-10-CM

## 2021-03-04 NOTE — Assessment & Plan Note (Signed)
PT/OT at SNF as tolerated. ?

## 2021-03-04 NOTE — Progress Notes (Signed)
NURSING HOME LOCATION:  [facility] ROOM NUMBER:  [place]:  DNR  PCP:  [name] DO  This is a comprehensive admission note to this SNFperformed on this date less than 30 days from date of admission. Included are preadmission medical/surgical history; reconciled medication list; family history; social history and comprehensive review of systems.  Corrections and additions to the records were documented. Comprehensive physical exam was also performed. Additionally a clinical summary was entered for each active diagnosis pertinent to this admission in the Problem List to enhance continuity of care.  HPI: Patient was hospitalized 5/22 - 03/03/2021 presenting to the ED after mechanical fall  CT of the pelvis revealed numerous fractures of the right hemipelvis.  Dr. [name] recommended WBAT and nonoperative management.  Pain was adequately controlled by scheduled Tylenol after narcotics were weaned and discontinued because of associated AMS as per daughter. CT of the head had revealed no acute change. She has a history of chronic congestive heart failure; @ D ECHO revealed an EF of 55-60% with grade 3 diastolic dysfunction and moderate pulmonary hypertension. Hospital course was complicated by A. fib with RVR, AKI superimposed on CKD, & acute hypoxic respiratory failure due to suspected aspiration pneumonia.  AKI was likely hemodynamically mediated and improved with return to baseline. The A. fib with RVR was treated with Cardizem infusion with better rate control.  At discharge she was on Cardizem, metoprolol, and Eliquis. Demadex was continued at half home dose for the chronic diastolic heart failure. She received Unasyn 5/24-5/28 for clinical aspiration pneumonia.  E. coli UTI , sensitive to Unasyn was also documented. Her anemia remained stable and no transfusions were required. Palliative Care consulted; she has a DNR in place because of her frail state  with risk of decompensation in the setting of severe malnutrition.  Past medical and surgical history: Includes diagnoses of tricuspid regurgitation, pulmonary hypertension, permanent A. fib, macular degeneration, iron deficiency anemia, essential hypertension, dyslipidemia, fibrocystic breast disease, history of DVT, CKD stage II, history of cervical cancer, history of chronic diastolic congestive heart failure, history of cerebellar hemorrhage, history of adenomatous colon polyp, and Barrett's esophagus. Procedures include cataract surgeries and TAH.  Social history: Nondrinker; never smoked.  Family history: Stroke present in both parents.  History is noncontributory due to her advanced age.   Review of systems: When asked the name of the president she stated "yucky".  Initially she stated the year was 202 but corrected this to 2022.  Her only complaint at this time is pain in her side related to the fractures sustained in the fall.  Constitutional: No fever, significant weight change, fatigue  Eyes: No redness, discharge, pain, vision change ENT/mouth: No nasal congestion, purulent discharge, earache, change in hearing, sore throat  Cardiovascular: No chest pain, palpitations, paroxysmal nocturnal dyspnea, claudication, edema  Respiratory: No cough, sputum production, hemoptysis, DOE, significant snoring, apnea  Gastrointestinal: No heartburn, dysphagia, abdominal pain, nausea /vomiting, rectal bleeding, melena, change in bowels Genitourinary: No dysuria, hematuria, pyuria, incontinence, nocturia Dermatologic: No rash, pruritus, change in appearance of skin Neurologic: No dizziness, headache, syncope, seizures, numbness, tingling Psychiatric: No significant anxiety, depression, insomnia, anorexia Endocrine: No change in hair/skin/nails, excessive thirst, excessive hunger, excessive urination  Hematologic/lymphatic: No significant bruising, lymphadenopathy, abnormal  bleeding Allergy/immunology: No itchy/watery eyes, significant sneezing, urticaria, angioedema  Physical exam:  Pertinent or positive findings: She appears her stated age and very frail.  Responses are slow and she tends to repeat  herself when answering queries.  It is almost a "baby talk" cadence.  For instance when I asked what happened she stated "I fell, I fell".  Facies tend to be blank.  Nasal oxygen is in place.  She has complete dentures.  Heart sounds are distant and rhythm is irregular.  Breath sounds are decreased.  Pedal pulses are decreased.  Strength to opposition of the upper extremities is fair.  She has a bruise over the right temple area.  General appearance: no acute distress, increased work of breathing is present.   Lymphatic: No lymphadenopathy about the head, neck, axilla. Eyes: No conjunctival inflammation or lid edema is present. There is no scleral icterus. Ears:  External ear exam shows no significant lesions or deformities.   Nose:  External nasal examination shows no deformity or inflammation. Nasal mucosa are pink and moist without lesions, exudates Oral exam: Lips and gums are healthy appearing.There is no oropharyngeal erythema or exudate. Neck:  No thyromegaly, masses, tenderness noted.    Heart:  No gallop, murmur, click, rub.  Lungs: without wheezes, rhonchi, rales, rubs. Abdomen: Bowel sounds are normal.  Abdomen is soft and nontender with no organomegaly, hernias, masses. GU: Deferred  Extremities:  No cyanosis, clubbing, edema. Neurologic exam: Balance, Rhomberg, finger to nose testing could not be completed due to clinical state Skin: Warm & dry w/o tenting. No significant lesions or rash.  See clinical summary under each active problem in the Problem List with associated updated therapeutic plan

## 2021-03-04 NOTE — Patient Instructions (Signed)
See assessment and plan under each diagnosis in the problem list and acutely for this visit 

## 2021-03-04 NOTE — Assessment & Plan Note (Signed)
Avoid nephrotoxic agents.

## 2021-03-04 NOTE — Assessment & Plan Note (Addendum)
Rate controlled with calcium channel blocker and beta-blocker.  Eliquis for thromboembolic prophylaxis lifelong. PMH of DVT.

## 2021-03-04 NOTE — Assessment & Plan Note (Addendum)
Current total protein is low normal at 6.6; albumin is 3.4.  Nutrition consult at SNF with protein supplementation. Clinically weak & frail on exam.

## 2021-03-05 NOTE — Assessment & Plan Note (Signed)
No clinical signs of CHF; ie no NVD or peripheral edema.

## 2021-03-05 NOTE — Assessment & Plan Note (Signed)
O2 sats 99% & chest clear

## 2021-03-11 ENCOUNTER — Encounter: Payer: Self-pay | Admitting: Adult Health

## 2021-03-11 ENCOUNTER — Non-Acute Institutional Stay (SKILLED_NURSING_FACILITY): Payer: Medicare Other | Admitting: Adult Health

## 2021-03-11 DIAGNOSIS — S32810G Multiple fractures of pelvis with stable disruption of pelvic ring, subsequent encounter for fracture with delayed healing: Secondary | ICD-10-CM

## 2021-03-11 DIAGNOSIS — I4891 Unspecified atrial fibrillation: Secondary | ICD-10-CM | POA: Diagnosis not present

## 2021-03-11 DIAGNOSIS — I5033 Acute on chronic diastolic (congestive) heart failure: Secondary | ICD-10-CM

## 2021-03-11 NOTE — Progress Notes (Signed)
Location:  [place] Room Number: 126-B Place of Service:  SNF (31) Provider:  Durenda Age, DNP, FNP-BC  Patient Care Team: Kelli Morning, DO as PCP - General (Family Medicine) Kelli Margarita, MD as PCP - Cardiology (Cardiology)  Extended Emergency Contact Information Primary Emergency Contact: Kelli Peterson Address: [place] [address] Kelli Peterson Phone: [phone] Mobile Phone: [phone] Relation: Daughter  Code Status:  DNR  Goals of care: Advanced Directive information Advanced Directives 03/11/2021  Does Patient Have a Medical Advance Directive? Yes  Type of Advance Directive Out of facility DNR (pink MOST or yellow form);[place];Living will  Does patient want to make changes to medical advance directive? No - Patient declined  Copy of [name] in Chart? Yes - validated most recent copy scanned in chart (See row information)  Would patient like information on creating a medical advance directive? -     Chief Complaint  Patient presents with   Acute Visit    Short-term Rehabilitation    HPI:  Pt is a 87 y.o. female seen today for medical management of chronic diseases.  HR ranging from 86-100, with outlier 119.  She states Eliquis 2.5 mg twice a day, Toprol XL 50 mg daily and Cardizem CD 240 mg daily for atrial fibrillation. No reported SOB.  She takes torsemide 20 mg daily for chronic diastolic heart failure.  She was admitted to [facility] on  03/03/21 post hospital admission 02/22/21 to 03/03/21 S/P mechanical fall at home sustaining numerous pelvic fracture.  She has a PMH of atrial fibrillation, CAD, CKD stage IIIa, hypertension, hyperlipidemia, history of DVT and chronic diastolic heart failure.  Orthopedics was consulted and recommended WBAT and to follow-up in 2 to 3 weeks with Dr. Doreatha Peterson.   Past Medical History:   Diagnosis Date   Adenomatous colon polyp 2007   Barrett's esophagus 2007   Cerebellar hemorrhage (HCC)    Cervical cancer (HCC)    Chronic diastolic CHF (congestive heart failure) (HCC)    CKD (chronic kidney disease), stage II    Closed pelvic fracture (HCC)    DVT (deep venous thrombosis) (HCC)    Fibrocystic breast disease    Fracture of pubic ramus (HCC)    Hyperlipidemia    Hypertension    Iron deficiency anemia    Macular degeneration    Permanent atrial fibrillation (HCC)    Pulmonary hypertension (HCC)    Tricuspid regurgitation    Past Surgical History:  Procedure Laterality Date   CATARACT EXTRACTION Right 02/15/2010   [facility]   CATARACT EXTRACTION Left 01/30/2010   [facility]   CATARACT EXTRACTION, BILATERAL  2010   TOTAL ABDOMINAL HYSTERECTOMY  1964    Allergies  Allergen Reactions   Penicillins Swelling and Other (See Comments)    Ankles and feet swell   Zocor [Simvastatin] Other (See Comments)    Weak and dizzy   Nsaids Other (See Comments)    Tylenol is all that is permitted    Outpatient Encounter Medications as of 03/11/2021  Medication Sig   acetaminophen (TYLENOL) 500 MG tablet Take 2 tablets (1,000 mg total) by mouth 3 (three) times daily.   apixaban (ELIQUIS) 2.5 MG TABS tablet Take 2.5 mg by mouth 2 (two) times daily.   atorvastatin (LIPITOR) 20 MG tablet Take 20 mg by mouth daily.   bisacodyl (DULCOLAX) 10 MG suppository Place 10  mg rectally as needed for moderate constipation.   brimonidine (ALPHAGAN) 0.2 % ophthalmic solution Place 1 drop into the right eye 2 (two) times daily.   denosumab (PROLIA) 60 MG/ML SOSY injection Inject 60 mg into the skin every 6 (six) months.   diltiazem (CARDIZEM CD) 240 MG 24 hr capsule Take 1 capsule (240 mg total) by mouth daily.   dorzolamide-timolol (COSOPT) 22.3-6.8 MG/ML ophthalmic solution Place 1 drop into both eyes 2 (two) times daily.   latanoprost (XALATAN) 0.005 % ophthalmic solution Place 1 drop into both  eyes every evening.   Magnesium Hydroxide (MILK OF MAGNESIA PO) Take 30 mLs by mouth as needed.   metoprolol succinate (TOPROL-XL) 25 MG 24 hr tablet Take 2 tablets (50 mg total) by mouth daily.   Multiple Vitamin (MULTIVITAMIN WITH MINERALS) TABS tablet Take 1 tablet by mouth daily.   polyethylene glycol (MIRALAX / GLYCOLAX) 17 g packet Take 17 g by mouth daily.   potassium chloride (KLOR-CON) 10 MEQ tablet Take 1 tablet (10 mEq total) by mouth daily.   Sodium Phosphates (RA SALINE ENEMA RE) Place rectally as needed.   torsemide (DEMADEX) 20 MG tablet Take 1 tablet (20 mg total) by mouth daily.   [DISCONTINUED] feeding supplement (ENSURE ENLIVE / ENSURE PLUS) LIQD Take 237 mLs by mouth daily.   No facility-administered encounter medications on file as of 03/11/2021.    Review of Systems  GENERAL: No change in appetite, no fatigue, no weight changes, no fever or chills  MOUTH and THROAT: Denies oral discomfort, gingival pain or bleeding RESPIRATORY: no cough, SOB, DOE, wheezing, hemoptysis CARDIAC: No chest pain, edema or palpitations GI: No abdominal pain, diarrhea, constipation, heart burn, nausea or vomiting GU: Denies dysuria, frequency, hematuria or discharge NEUROLOGICAL: Denies dizziness, syncope, numbness, or headache PSYCHIATRIC: Denies feelings of depression or anxiety. No report of hallucinations, insomnia, paranoia, or agitation   Immunization History  Administered Date(s) Administered   IPV 11/14/2019, 05/23/2020, 12/08/2020   Influenza, Quadrivalent, Recombinant, Inj, Pf 08/02/2017, 06/27/2018   Influenza,inj,quad, With Preservative 06/06/2017   Influenza-Unspecified 08/21/1996   PFIZER Comirnaty(Gray Top)Covid-19 Tri-Sucrose Vaccine 03/03/2021   PFIZER(Purple Top)SARS-COV-2 Vaccination 12/13/2019, 01/03/2020   PPD Test 02/18/2016   Pneumococcal Polysaccharide-23 05/22/2002   Tdap 06/17/2011   Pertinent  Health Maintenance Due  Topic Date Due   DEXA SCAN  Never done    PNA vac Low Risk Adult (2 of 2 - PCV13) 05/23/2003   INFLUENZA VACCINE  05/04/2021   No flowsheet data found.   Vitals:   03/11/21 1408  BP: 126/62  Pulse: 70  Resp: 20  Temp: 97.9 F (36.6 C)  Weight: 111 lb 9.6 oz (50.6 kg)  Height: 5' (1.524 m)   Body mass index is 21.8 kg/m.  Physical Exam  GENERAL APPEARANCE: Well nourished. In no acute distress. Normal body habitus SKIN:  Skin is warm and dry.  MOUTH and THROAT: Lips are without lesions. Oral mucosa is moist and without lesions.  RESPIRATORY: Breathing is even & unlabored, BS CTAB CARDIAC: Irregular heart rhythm no murmur,no extra heart sounds, no edema GI: Abdomen soft, normal BS, no masses, no tenderness EXTREMITIES:  Able to move X 4 extremities NEUROLOGICAL: There is no tremor. Speech is clear. Alert and oriented X 3. PSYCHIATRIC:  Affect and behavior are appropriate  Labs reviewed: Recent Labs    04/21/20 0524 04/22/20 0446 04/23/20 0634 04/24/20 0424 02/26/21 0052 02/27/21 0146 02/28/21 0102  NA 139 139 141   < > 137 139 139  K 3.6 3.3* 4.3   < > 4.1 3.5 3.6  CL 95* 92* 97*   < > 105 105 107  CO2 35* 38* 35*   < > 23 27 25   GLUCOSE 94 98 95   < > 116* 108* 111*  BUN 19 18 19    < > 28* 25* 25*  CREATININE 1.13* 1.12* 1.15*   < > 1.16* 1.19* 1.05*  CALCIUM 8.7* 8.7* 9.3   < > 8.3* 8.4* 8.6*  MG 1.9 2.0 2.1  --   --   --   --   PHOS 3.2 3.9 4.4  --   --   --   --    < > = values in this interval not displayed.   Recent Labs    04/21/20 0524 04/22/20 0446 04/23/20 0634  AST 35 39 50*  ALT 24 27 36  ALKPHOS 47 47 46  BILITOT 0.7 0.8 0.4  PROT 6.7 6.8 6.6  ALBUMIN 2.2* 2.3* 2.4*   Recent Labs    04/22/20 0446 04/23/20 0634 04/24/20 0424 02/22/21 1334 02/22/21 1700 02/25/21 0037 02/26/21 0052 02/28/21 0102  WBC 6.6 6.0   < > 17.9*   < > 10.5 11.7* 9.0  NEUTROABS 4.6 3.9  --  16.0*  --   --   --   --   HGB 10.0* 9.7*   < > 14.1   < > 9.5* 10.0* 9.2*  HCT 31.8* 31.7*   < > 44.5   < >  29.7* 31.3* 28.3*  MCV 82.4 82.8   < > 87.9   < > 86.8 87.4 86.8  PLT 257 242   < > 226   < > PLATELET CLUMPS NOTED ON SMEAR, COUNT APPEARS DECREASED 123* 145*   < > = values in this interval not displayed.   Lab Results  Component Value Date   TSH 0.583 02/25/2021   No results found for: HGBA1C Lab Results  Component Value Date   CHOL 106 02/20/2016   HDL 31 (A) 02/20/2016   LDLCALC 56 02/20/2016   TRIG 92 02/20/2016    Significant Diagnostic Results in last 30 days:  CT Head Wo Contrast  Result Date: 02/22/2021 CLINICAL DATA:  Fall, head trauma EXAM: CT HEAD WITHOUT CONTRAST TECHNIQUE: Contiguous axial images were obtained from the base of the skull through the vertex without intravenous contrast. COMPARISON:  02/07/2018 FINDINGS: Brain: No evidence of acute infarction, hemorrhage, hydrocephalus, extra-axial collection or mass lesion/mass effect. Advanced low-density changes within the periventricular and subcortical white matter compatible with chronic microvascular ischemic change. Mild diffuse cerebral volume loss. Vascular: Atherosclerotic calcifications involving the large vessels of the skull base. No unexpected hyperdense vessel. Skull: Normal. Negative for fracture or focal lesion. Sinuses/Orbits: No acute finding. Other: Negative for scalp hematoma. IMPRESSION: 1. No acute intracranial findings. 2. Chronic microvascular ischemic change and cerebral volume loss. Electronically Signed   By: Davina Poke D.O.   On: 02/22/2021 14:16   CT PELVIS WO CONTRAST  Result Date: 02/22/2021 CLINICAL DATA:  Unwitnessed fall in a bathroom today. Right hip pain. EXAM: CT PELVIS WITHOUT CONTRAST TECHNIQUE: Multidetector CT imaging of the pelvis was performed following the standard protocol without intravenous contrast. COMPARISON:  Current right hip radiographs. FINDINGS: Musculoskeletal: Right pelvic fractures. There is a nondisplaced, coronal oblique fracture extending across the right ilium  from the inferior margin of the SI joint 2 the level of the posterosuperior right acetabulum. Another fracture crosses the pubis at its junction with  the medial acetabulum, displaced by 5 mm. A third fracture is noted of the inferior pubic ramus at the level of the abductor muscle origins. No other fractures. No bone lesions. Hip joints, SI joints and pubic symphysis are normally aligned. There is soft tissue hemorrhage at lies medial to the right pubis and anterior medial right acetabulum, mildly indenting the bladder. Urinary Tract: No bladder mass, wall thickening or stone. Visualized ureters normal in course and in caliber. Bowel: Rectum moderately distended with stool. Multiple sigmoid colon diverticula. No bowel wall thickening or inflammation. Vascular/Lymphatic: Aortoiliac atherosclerotic calcifications stable from the previous CT. No enlarged lymph nodes. Reproductive:  Status post hysterectomy.  No pelvic masses. Other:  None. IMPRESSION: 1. Fractures of the right hemipelvis as detailed above, involving the right ileum, junction of the right pubis and anteromedial acetabulum and of the inferior right pubic ramus. Associated extraperitoneal hemorrhage along the right anterior inferior pelvis. 2. No fracture of the right proximal femur. No dislocation. No bone lesions. Electronically Signed   By: Lajean Manes M.D.   On: 02/22/2021 12:27   DG Chest Port 1 View  Result Date: 02/23/2021 CLINICAL DATA:  Shortness of breath, history CHF, hypertension, atrial fibrillation EXAM: PORTABLE CHEST 1 VIEW COMPARISON:  Portable exam 1824 hours compared to 02/22/2021 FINDINGS: Kyphotic positioning with artifacts project over LEFT chest. Enlargement of cardiac silhouette with slight vascular congestion. Atherosclerotic calcification aorta. Mild perihilar infiltrates likely pulmonary edema. No pleural effusion or pneumothorax. Marked osseous demineralization. IMPRESSION: Enlargement of cardiac silhouette with suspect  mild pulmonary edema. Aortic Atherosclerosis (ICD10-I70.0). Electronically Signed   By: Lavonia Dana M.D.   On: 02/23/2021 18:41   DG Chest Port 1 View  Result Date: 02/22/2021 CLINICAL DATA:  Fall EXAM: PORTABLE CHEST 1 VIEW COMPARISON:  04/23/2020 FINDINGS: Cardiomegaly, vascular congestion. No overt edema, confluent opacities or effusions. Aortic atherosclerosis. No acute bony abnormality. IMPRESSION: Cardiomegaly, vascular congestion. Aortic atherosclerosis. Electronically Signed   By: Rolm Baptise M.D.   On: 02/22/2021 14:11   DG Knee Right Port  Result Date: 02/23/2021 CLINICAL DATA:  87 year old with acute knee pain. EXAM: PORTABLE RIGHT KNEE - 1-2 VIEW COMPARISON:  None. FINDINGS: Negative for fracture or dislocation. Extensive osteophytosis in the lateral knee compartment and patellofemoral compartment. Question a small joint effusion. Diffuse vascular calcifications. IMPRESSION: Osteoarthritis in the right knee without acute bone abnormality. Electronically Signed   By: Markus Daft M.D.   On: 02/23/2021 11:54   ECHOCARDIOGRAM COMPLETE  Result Date: 02/24/2021    ECHOCARDIOGRAM REPORT   Patient Name:   Kelli Peterson Date of Exam: 02/24/2021 Medical Rec #:  [id number]        Height:       60.0 in Accession #:    [id number]       Weight:       111.8 lb Date of Birth:  1930/09/13         BSA:          1.458 m Patient Age:    9 years         BP:           101/67 mmHg Patient Gender: F                HR:           95 bpm. Exam Location:  Inpatient Procedure: 2D Echo, Cardiac Doppler and Color Doppler Indications:    R94.31 Abnormal EKG  History:        Patient  has prior history of Echocardiogram examinations, most                 recent 04/16/2020. CHF, Pulmonary HTN, Arrythmias:Atrial                 Fibrillation; Risk Factors:Hypertension and Dyslipidemia. CKD.                 Cancer.  Sonographer:    Jonelle Sidle Dance Referring Phys: [id number] [place] IMPRESSIONS  1. Left ventricular ejection fraction,  by estimation, is 55 to 60%. The left ventricle has normal function. The left ventricle has no regional wall motion abnormalities. There is mild left ventricular hypertrophy. Left ventricular diastolic parameters are consistent with Grade III diastolic dysfunction (restrictive).  2. Right ventricular systolic function is normal. The right ventricular size is normal. There is moderately elevated pulmonary artery systolic pressure. The estimated right ventricular systolic pressure is 28.3 mmHg.  3. Left atrial size was severely dilated.  4. Right atrial size was severely dilated.  5. The mitral valve is normal in structure. Mild mitral valve regurgitation. No evidence of mitral stenosis.  6. Tricuspid valve regurgitation is severe.  7. The aortic valve is normal in structure. There is mild calcification of the aortic valve. There is mild thickening of the aortic valve. Aortic valve regurgitation is not visualized. Mild to moderate aortic valve sclerosis/calcification is present, without any evidence of aortic stenosis.  8. The inferior vena cava is normal in size with greater than 50% respiratory variability, suggesting right atrial pressure of 3 mmHg. FINDINGS  Left Ventricle: Left ventricular ejection fraction, by estimation, is 55 to 60%. The left ventricle has normal function. The left ventricle has no regional wall motion abnormalities. The left ventricular internal cavity size was normal in size. There is  mild left ventricular hypertrophy. Left ventricular diastolic parameters are consistent with Grade III diastolic dysfunction (restrictive). Right Ventricle: The right ventricular size is normal. No increase in right ventricular wall thickness. Right ventricular systolic function is normal. There is moderately elevated pulmonary artery systolic pressure. The tricuspid regurgitant velocity is 3.43 m/s, and with an assumed right atrial pressure of 8 mmHg, the estimated right ventricular systolic pressure is 15.1  mmHg. Left Atrium: Left atrial size was severely dilated. Right Atrium: Right atrial size was severely dilated. Pericardium: There is no evidence of pericardial effusion. Mitral Valve: The mitral valve is normal in structure. Mild mitral valve regurgitation. No evidence of mitral valve stenosis. Tricuspid Valve: The tricuspid valve is normal in structure. Tricuspid valve regurgitation is severe. No evidence of tricuspid stenosis. Aortic Valve: The aortic valve is normal in structure. There is mild calcification of the aortic valve. There is mild thickening of the aortic valve. Aortic valve regurgitation is not visualized. Mild to moderate aortic valve sclerosis/calcification is present, without any evidence of aortic stenosis. Aortic valve mean gradient measures 3.5 mmHg. Aortic valve peak gradient measures 6.8 mmHg. Aortic valve area, by VTI measures 1.09 cm. Pulmonic Valve: The pulmonic valve was normal in structure. Pulmonic valve regurgitation is not visualized. No evidence of pulmonic stenosis. Aorta: The aortic root is normal in size and structure. Venous: The inferior vena cava is normal in size with greater than 50% respiratory variability, suggesting right atrial pressure of 3 mmHg. IAS/Shunts: No atrial level shunt detected by color flow Doppler.  LEFT VENTRICLE PLAX 2D LVIDd:         3.70 cm LVIDs:         2.90 cm LV  PW:         1.30 cm LV IVS:        1.20 cm LVOT diam:     1.70 cm LV SV:         26 LV SV Index:   18 LVOT Area:     2.27 cm  LV Volumes (MOD) LV vol d, MOD A2C: 30.1 ml LV vol d, MOD A4C: 31.1 ml LV vol s, MOD A2C: 11.1 ml LV vol s, MOD A4C: 13.2 ml LV SV MOD A2C:     19.0 ml LV SV MOD A4C:     31.1 ml LV SV MOD BP:      18.2 ml RIGHT VENTRICLE          IVC RV Basal diam:  3.00 cm  IVC diam: 2.20 cm TAPSE (M-mode): 1.0 cm LEFT ATRIUM           Index       RIGHT ATRIUM           Index LA diam:      4.30 cm 2.95 cm/m  RA Area:     28.10 cm LA Vol (A2C): 89.7 ml 61.54 ml/m RA Volume:    93.50 ml  64.14 ml/m LA Vol (A4C): 52.3 ml 35.88 ml/m  AORTIC VALVE AV Area (Vmax):    1.15 cm AV Area (Vmean):   1.08 cm AV Area (VTI):     1.09 cm AV Vmax:           130.00 cm/s AV Vmean:          87.750 cm/s AV VTI:            0.236 m AV Peak Grad:      6.8 mmHg AV Mean Grad:      3.5 mmHg LVOT Vmax:         66.10 cm/s LVOT Vmean:        41.600 cm/s LVOT VTI:          0.113 m LVOT/AV VTI ratio: 0.48  AORTA Ao Root diam: 2.80 cm Ao Asc diam:  3.00 cm MITRAL VALVE                TRICUSPID VALVE MV Area (PHT): 5.42 cm     TR Peak grad:   47.1 mmHg MV Decel Time: 140 msec     TR Vmax:        343.00 cm/s MV E velocity: 127.00 cm/s MV A velocity: 34.90 cm/s   SHUNTS MV E/A ratio:  3.64         Systemic VTI:  0.11 m                             Systemic Diam: 1.70 cm Candee Furbish MD Electronically signed by Candee Furbish MD Signature Date/Time: 02/24/2021/4:34:31 PM    Final    DG Hip Unilat  With Pelvis 2-3 Views Right  Result Date: 02/22/2021 CLINICAL DATA:  Unwitnessed fall.  Right hip pain. EXAM: DG HIP (WITH OR WITHOUT PELVIS) 2-3V RIGHT COMPARISON:  02/15/2016. FINDINGS: Fracture of the right pubis adjacent to the medial right acetabulum, minimally displaced, approximately 4 mm. There is a fracture of the inferior pubic, without significant displacement. A subtle fracture line is suggested of the right ilium just lateral to the inferior right SI joint. No other fractures.  Specifically, no right proximal femur fracture. Hip joints, SI joints and pubic symphysis are normally  aligned. Skeletal structures are diffusely demineralized. IMPRESSION: 1. Right pelvic fractures. Fracture at the junction of the right pubis with the medial acetabulum, fracture of the inferior right pubic ramus and a questionable fracture of right ilium just lateral to the inferior right SI joint. 2. No right proximal femur fracture.  No dislocation. Electronically Signed   By: Lajean Manes M.D.   On: 02/22/2021 12:08      Assessment/Plan  1. Atrial fibrillation with rapid ventricular response (HCC) -Continue Eliquis for anticoagulation and Toprol-XL and Cardizem CD for rate control  2. Acute on chronic diastolic (congestive) heart failure (HCC) -  no SOB, continue torsemide and Klor-Con  3. Multiple closed pelvic fractures with disruption of pelvic circle, with delayed healing, subsequent encounter -    continue PT and OT, for therapeutic strengthening exercises -   Follow-up with orthopedics    Family/ staff Communication: Discussed plan of care with resident and charge nurse.  Labs/tests ordered: None  Goals of care:   Short-term care/palliative care   Durenda Age, DNP, MSN, FNP-BC [facility] [phone] (Monday-Friday 8:00 a.m. - 5:00 p.m.) [phone] (after hours)

## 2021-03-13 ENCOUNTER — Non-Acute Institutional Stay: Payer: Medicare Other | Admitting: Hospice

## 2021-03-13 ENCOUNTER — Other Ambulatory Visit: Payer: Self-pay

## 2021-03-13 DIAGNOSIS — Z515 Encounter for palliative care: Secondary | ICD-10-CM | POA: Diagnosis not present

## 2021-03-13 DIAGNOSIS — S32810G Multiple fractures of pelvis with stable disruption of pelvic ring, subsequent encounter for fracture with delayed healing: Secondary | ICD-10-CM | POA: Diagnosis not present

## 2021-03-13 DIAGNOSIS — I4891 Unspecified atrial fibrillation: Secondary | ICD-10-CM | POA: Diagnosis not present

## 2021-03-13 NOTE — Progress Notes (Signed)
[facility] Consult Note Telephone: [phone]  Fax: [phone]  PATIENT NAME: Kelli Peterson [address] [place] [address] [phone] (home)  DOB: 29-Apr-1930 MRN: [id number]  PRIMARY CARE PROVIDER:    [name], DO,  [address] [place] [address] [phone]  REFERRING PROVIDER:   Dr. Keturah Barre. [place]  RESPONSIBLE PARTY:    Contact Information     Name Relation Home Work Mobile   [name],[name] Daughter [phone]  [phone]       I met face to face with patient at facility. Palliative Care was asked to follow this patient by consultation request of Dr. Keturah Barre. Linna Darner to address advance care planning, complex medical decision making and goals of care clarification.  NP called [name] and left her a voicemail with callback number.  This is the initial visit.    ASSESSMENT AND / RECOMMENDATIONS:   Advance Care Planning: Our advance care planning conversation included a discussion about:    The value and importance of advance care planning  Difference between Hospice and Palliative care Exploration of goals of care in the event of a sudden injury or illness  Identification and preparation of a healthcare agent  Review and updating or creation of an  advance directive document .   CODE STATUS: Patient is a DO NOT RESUSCITATE.  Goals of Care: Goals include to maximize quality of life and symptom management  I spent 16 minutes providing this initial consultation. More than 50% of the time in this consultation was spent on counseling patient and coordinating communication. --------------------------------------------------------------------------------------------------------------------------------------  Symptom Management/Plan: Hip fracture: Numerous right-sided pelvic fractures secondary to mechanical fall PT OT is ongoing.  Weightbearing as tolerated.  Follow-up with Dr. [name] as  planned. Pain: Adequately controlled by Tylenol. A. fib: Continue Eliquis and Cardizem Chronic diastolic heart failure: Continue Demadex as ordered.  Routine CBC BMP. Follow up: Palliative care will continue to follow for complex medical decision making, advance care planning, and clarification of goals. Return 6 weeks or prn.Encouraged to call provider sooner with any concerns.   Family /Caregiver/Community Supports: Patient in SNF for acute rehab  PPS: 40%  HOSPICE ELIGIBILITY/DIAGNOSIS: TBD  Chief Complaint: Initial Palliative care visit: Right hip fracture  HISTORY OF PRESENT ILLNESS:  Kelli Peterson is a 87 y.o. year old female  with multiple medical conditions including acute numerous right-sided hip fractures secondary to mechanical fall with associated pain and difficulty in mobility. Fracture is nonoperative; conservative treatment only.  Hip fracture impairs her mobility.  She reports pain is worse when she tries to move around; laying still is helpful; patient is currently managed with Tylenol.  History of A. fib, chronic diastolic heart failure, gait disturbance, difficulty walking, cognitive deficits, hypertension.  History obtained from review of EMR, discussion with primary team, caregiver, family and/or Ms. Slaven.  Review and summarization of Epic records shows history from other than patient. Rest of 10 point ROS asked and negative.     Review of lab tests/diagnostics   Results for Kelli, Peterson (MRN [id number]) as of 03/13/2021 17:08  Ref. Range 02/28/2021 01:02  Sodium Latest Ref Range: 135 - 145 mmol/L 139  Potassium Latest Ref Range: 3.5 - 5.1 mmol/L 3.6  Chloride Latest Ref Range: 98 - 111 mmol/L 107  CO2 Latest Ref Range: 22 - 32 mmol/L 25  Glucose Latest Ref Range: 70 - 99 mg/dL 111 (H)  BUN Latest Ref Range: 8 - 23 mg/dL 25 (H)  Creatinine Latest Ref Range: 0.44 - 1.00 mg/dL 1.05 (H)  Calcium Latest Ref Range: 8.9 - 10.3 mg/dL 8.6 (L)  Anion gap Latest  Ref Range: 5 - 15  7  GFR, Estimated Latest Ref Range: >60 mL/min 51 (L)   ROS General: NAD EYES: denies vision changes ENMT: denies dysphagia Cardiovascular: denies chest pain/discomfort Pulmonary: denies cough, denies SOB Abdomen: endorses good appetite, denies constipation/diarrhea GU: denies dysuria, urinary frequency MSK: Endorsed recent  fall and hip fracture Skin: denies rashes or wounds Neurological: denies pain, denies insomnia Psych: Endorses positive mood Heme/lymph/immuno: denies bruises, abnormal bleeding  Physical Exam: Constitutional: NAD General: Well groomed, cooperative EYES: anicteric sclera, lids intact, no discharge  ENMT: Moist mucous membrane CV: S1 S2, RRR, no LE edema Pulmonary: LCTA, no increased work of breathing, no cough, Abdomen: active BS + 4 quadrants, soft and non tender GU: no suprapubic tenderness MSK: weakness, moderate sarcopenia, limited ROM Skin: warm and dry, no rashes or wounds on visible skin Neuro:  weakness, otherwise non focal Psych: non-anxious affect Hem/lymph/immuno: no widespread bruising   PAST MEDICAL HISTORY:  Active Ambulatory Problems    Diagnosis Date Noted   Multiple closed pelvic fractures with disruption of pelvic circle, with delayed healing, subsequent encounter 02/15/2016   Fracture of pubic ramus (HCC) 02/15/2016   Atrial fibrillation with RVR (HCC)    AKI (acute kidney injury) ([facility]) 05/31/2018   Dehydration 05/31/2018   Essential hypertension 05/31/2018   Atrial fibrillation, chronic (HCC) 05/31/2018   COPD (chronic obstructive pulmonary disease) ([facility]) 05/31/2018   Glaucoma 05/31/2018   Hyperlipidemia 05/31/2018   Constipation 05/31/2018   Protein-calorie malnutrition, severe 06/02/2018   Branch retinal vein occlusion with macular edema of left eye 01/23/2020   Branch retinal vein occlusion of right eye 01/23/2020   Exudative age-related macular degeneration of right eye with inactive choroidal  neovascularization ([facility]) 01/23/2020   Intermediate stage nonexudative age-related macular degeneration of left eye 01/23/2020   CKD (chronic kidney disease), stage III ([facility]) 04/15/2020   Acute hypoxemic respiratory failure (HCC) 04/15/2020   Atrial fibrillation with rapid ventricular response (HCC)    Acute on chronic diastolic (congestive) heart failure (HCC)    Pulmonary hypertension (HCC)    Acute kidney injury superimposed on chronic kidney disease (HCC)    Pressure injury of skin 04/17/2020   Acute on chronic diastolic heart failure (HCC)    Hip fracture, unspecified laterality, closed, initial encounter ([facility]) 02/22/2021   Resolved Ambulatory Problems    Diagnosis Date Noted   Acute right hip pain    Acute renal failure (ARF) ([facility]) 05/31/2018   SOB (shortness of breath)    Fall 02/22/2021   Past Medical History:  Diagnosis Date   Adenomatous colon polyp 2007   Barrett's esophagus 2007   Cerebellar hemorrhage (HCC)    Cervical cancer (HCC)    Chronic diastolic CHF (congestive heart failure) (HCC)    CKD (chronic kidney disease), stage II    Closed pelvic fracture (HCC)    DVT (deep venous thrombosis) (HCC)    Fibrocystic breast disease    Hypertension    Iron deficiency anemia    Macular degeneration    Permanent atrial fibrillation (HCC)    Tricuspid regurgitation     SOCIAL HX:  Social History   Tobacco Use   Smoking status: Never   Smokeless tobacco: Never  Substance Use Topics   Alcohol use: No     FAMILY HX:  Family History  Problem Relation Age of Onset  CVA Father    CVA Mother       ALLERGIES:  Allergies  Allergen Reactions   Penicillins Swelling and Other (See Comments)    Ankles and feet swell   Zocor [Simvastatin] Other (See Comments)    Weak and dizzy   Nsaids Other (See Comments)    Tylenol is all that is permitted      PERTINENT MEDICATIONS:  Outpatient Encounter Medications as of 03/13/2021  Medication Sig   acetaminophen  (TYLENOL) 500 MG tablet Take 2 tablets (1,000 mg total) by mouth 3 (three) times daily.   apixaban (ELIQUIS) 2.5 MG TABS tablet Take 2.5 mg by mouth 2 (two) times daily.   atorvastatin (LIPITOR) 20 MG tablet Take 20 mg by mouth daily.   bisacodyl (DULCOLAX) 10 MG suppository Place 10 mg rectally as needed for moderate constipation.   brimonidine (ALPHAGAN) 0.2 % ophthalmic solution Place 1 drop into the right eye 2 (two) times daily.   denosumab (PROLIA) 60 MG/ML SOSY injection Inject 60 mg into the skin every 6 (six) months.   diltiazem (CARDIZEM CD) 240 MG 24 hr capsule Take 1 capsule (240 mg total) by mouth daily.   dorzolamide-timolol (COSOPT) 22.3-6.8 MG/ML ophthalmic solution Place 1 drop into both eyes 2 (two) times daily.   latanoprost (XALATAN) 0.005 % ophthalmic solution Place 1 drop into both eyes every evening.   Magnesium Hydroxide (MILK OF MAGNESIA PO) Take 30 mLs by mouth as needed.   metoprolol succinate (TOPROL-XL) 25 MG 24 hr tablet Take 2 tablets (50 mg total) by mouth daily.   Multiple Vitamin (MULTIVITAMIN WITH MINERALS) TABS tablet Take 1 tablet by mouth daily.   polyethylene glycol (MIRALAX / GLYCOLAX) 17 g packet Take 17 g by mouth daily.   potassium chloride (KLOR-CON) 10 MEQ tablet Take 1 tablet (10 mEq total) by mouth daily.   Sodium Phosphates (RA SALINE ENEMA RE) Place rectally as needed.   torsemide (DEMADEX) 20 MG tablet Take 1 tablet (20 mg total) by mouth daily.   No facility-administered encounter medications on file as of 03/13/2021.    Thank you for the opportunity to participate in the care of Ms. Deriso.  The palliative care team will continue to follow. Please call our office at [phone] if we can be of additional assistance.   Note: Portions of this note were generated with [employer]. Dictation errors may occur despite best attempts at proofreading.  Teodoro Spray, NP

## 2021-03-20 ENCOUNTER — Non-Acute Institutional Stay (SKILLED_NURSING_FACILITY): Payer: Medicare Other | Admitting: Adult Health

## 2021-03-20 ENCOUNTER — Encounter: Payer: Self-pay | Admitting: Adult Health

## 2021-03-20 DIAGNOSIS — S32810G Multiple fractures of pelvis with stable disruption of pelvic ring, subsequent encounter for fracture with delayed healing: Secondary | ICD-10-CM

## 2021-03-20 DIAGNOSIS — N179 Acute kidney failure, unspecified: Secondary | ICD-10-CM

## 2021-03-20 DIAGNOSIS — I4891 Unspecified atrial fibrillation: Secondary | ICD-10-CM | POA: Diagnosis not present

## 2021-03-20 DIAGNOSIS — I10 Essential (primary) hypertension: Secondary | ICD-10-CM

## 2021-03-20 DIAGNOSIS — I2510 Atherosclerotic heart disease of native coronary artery without angina pectoris: Secondary | ICD-10-CM

## 2021-03-20 DIAGNOSIS — N189 Chronic kidney disease, unspecified: Secondary | ICD-10-CM

## 2021-03-20 DIAGNOSIS — I5032 Chronic diastolic (congestive) heart failure: Secondary | ICD-10-CM | POA: Diagnosis not present

## 2021-03-20 MED ORDER — POTASSIUM CHLORIDE ER 10 MEQ PO TBCR: 10.0000 meq | EXTENDED_RELEASE_TABLET | Freq: Every day | ORAL | 0 refills | Status: DC

## 2021-03-20 MED ORDER — DORZOLAMIDE HCL-TIMOLOL MAL 2-0.5 % OP SOLN: 1.0000 [drp] | Freq: Two times a day (BID) | OPHTHALMIC | 0 refills | Status: DC

## 2021-03-20 MED ORDER — APIXABAN 2.5 MG PO TABS: 2.5000 mg | ORAL_TABLET | Freq: Two times a day (BID) | ORAL | 0 refills | Status: DC

## 2021-03-20 MED ORDER — TORSEMIDE 20 MG PO TABS: 20.0000 mg | ORAL_TABLET | Freq: Every day | ORAL | 0 refills | Status: AC

## 2021-03-20 MED ORDER — LATANOPROST 0.005 % OP SOLN: 1.0000 [drp] | Freq: Every evening | OPHTHALMIC | 0 refills | Status: DC

## 2021-03-20 MED ORDER — ATORVASTATIN CALCIUM 20 MG PO TABS: 20.0000 mg | ORAL_TABLET | Freq: Every day | ORAL | 0 refills | Status: DC

## 2021-03-20 MED ORDER — METOPROLOL SUCCINATE ER 25 MG PO TB24: 50.0000 mg | ORAL_TABLET | Freq: Every day | ORAL | 0 refills | Status: DC

## 2021-03-20 MED ORDER — BRIMONIDINE TARTRATE 0.2 % OP SOLN: 1.0000 [drp] | Freq: Two times a day (BID) | OPHTHALMIC | 0 refills | Status: DC

## 2021-03-20 MED ORDER — DILTIAZEM HCL ER COATED BEADS 240 MG PO CP24: 240.0000 mg | ORAL_CAPSULE | Freq: Every day | ORAL | 0 refills | Status: AC

## 2021-03-20 NOTE — Progress Notes (Signed)
Location:  [facility] Room Number: 126-B Place of Service:  SNF (31) Provider:  Durenda Age, DNP, FNP-BC  Patient Care Team: Kelli Morning, DO as PCP - General (Family Medicine) Kelli Margarita, MD as PCP - Cardiology (Cardiology)  Extended Emergency Contact Information Primary Emergency Contact: Kelli Peterson Address: [place] [address] Kelli Peterson of [place] Phone: [phone] Mobile Phone: [phone] Relation: Daughter  Code Status: DNR    Goals of care: Advanced Directive information Advanced Directives 03/20/2021  Does Patient Have a Medical Advance Directive? Yes  Type of Paramedic of [place];Out of facility DNR (pink MOST or yellow form)  Does patient want to make changes to medical advance directive? No - Patient declined  Copy of [place] in Chart? Yes - validated most recent copy scanned in chart (See row information)  Would patient like information on creating a medical advance directive? -  Pre-existing out of facility DNR order (yellow form or pink MOST form) Yellow form placed in chart (order not valid for inpatient use)     Chief Complaint  Patient presents with   Discharge Note    HPI:  Pt is a 87 y.o. female who is for discharge home today, 03/20/21, with Home health PT and OT.  She was admitted to [facility] on 03/03/21 post hospital admission 02/22/21 to 03/03/21 S/P mechanical fall at home sustaining numerous pelvic fracture.  Orthopedics was consulted and recommended WBAT and to follow-up with orthopedics, Dr. [name].  Patient was admitted to this facility for short-term rehabilitation after the patient's recent hospitalization.  Patient has completed SNF rehabilitation and therapy has cleared the patient for discharge.   Past Medical History:  Diagnosis Date   Adenomatous colon polyp 2007   Barrett's esophagus 2007    Cerebellar hemorrhage (HCC)    Cervical cancer (HCC)    Chronic diastolic CHF (congestive heart failure) (HCC)    CKD (chronic kidney disease), stage II    Closed pelvic fracture (HCC)    DVT (deep venous thrombosis) (HCC)    Fibrocystic breast disease    Fracture of pubic ramus (HCC)    Hyperlipidemia    Hypertension    Iron deficiency anemia    Macular degeneration    Permanent atrial fibrillation (HCC)    Pulmonary hypertension (HCC)    Tricuspid regurgitation    Past Surgical History:  Procedure Laterality Date   CATARACT EXTRACTION Right 02/15/2010   [facility]   CATARACT EXTRACTION Left 01/30/2010   [facility]   CATARACT EXTRACTION, BILATERAL  2010   TOTAL ABDOMINAL HYSTERECTOMY  1964    Allergies  Allergen Reactions   Penicillins Swelling and Other (See Comments)    Ankles and feet swell   Zocor [Simvastatin] Other (See Comments)    Weak and dizzy   Nsaids Other (See Comments)    Tylenol is all that is permitted    Outpatient Encounter Medications as of 03/20/2021  Medication Sig   acetaminophen (TYLENOL) 500 MG tablet Take 2 tablets (1,000 mg total) by mouth 3 (three) times daily.   apixaban (ELIQUIS) 2.5 MG TABS tablet Take 2.5 mg by mouth 2 (two) times daily.   atorvastatin (LIPITOR) 20 MG tablet Take 20 mg by mouth daily.   bisacodyl (DULCOLAX) 10 MG suppository Place 10 mg rectally as needed for moderate constipation.   brimonidine (ALPHAGAN) 0.2 % ophthalmic solution Place 1 drop into the  right eye 2 (two) times daily.   denosumab (PROLIA) 60 MG/ML SOSY injection Inject 60 mg into the skin every 6 (six) months.   dorzolamide-timolol (COSOPT) 22.3-6.8 MG/ML ophthalmic solution Place 1 drop into both eyes 2 (two) times daily.   latanoprost (XALATAN) 0.005 % ophthalmic solution Place 1 drop into both eyes every evening.   Magnesium Hydroxide (MILK OF MAGNESIA PO) Take 30 mLs by mouth as needed.   metoprolol succinate (TOPROL-XL) 25 MG 24 hr tablet Take 2 tablets (50 mg  total) by mouth daily.   Multiple Vitamin (MULTIVITAMIN WITH MINERALS) TABS tablet Take 1 tablet by mouth daily.   polyethylene glycol (MIRALAX / GLYCOLAX) 17 g packet Take 17 g by mouth daily.   Sodium Phosphates (RA SALINE ENEMA RE) Place rectally as needed.   torsemide (DEMADEX) 20 MG tablet Take 1 tablet (20 mg total) by mouth daily.   diltiazem (CARDIZEM CD) 240 MG 24 hr capsule Take 1 capsule (240 mg total) by mouth daily.   potassium chloride (KLOR-CON) 10 MEQ tablet Take 1 tablet (10 mEq total) by mouth daily.   No facility-administered encounter medications on file as of 03/20/2021.    Review of Systems  GENERAL: No change in appetite, no fatigue, no weight changes, no fever, chills or weakness MOUTH and THROAT: Denies oral discomfort, gingival pain or bleeding RESPIRATORY: no cough, SOB, DOE, wheezing, hemoptysis CARDIAC: No chest pain, edema or palpitations GI: No abdominal pain, diarrhea, constipation, heart burn, nausea or vomiting GU: Denies dysuria, frequency, hematuria or discharge NEUROLOGICAL: Denies dizziness, syncope, numbness, or headache PSYCHIATRIC: Denies feelings of depression or anxiety. No report of hallucinations, insomnia, paranoia, or agitation   Immunization History  Administered Date(s) Administered   IPV 11/14/2019, 05/23/2020, 12/08/2020   Influenza, Quadrivalent, Recombinant, Inj, Pf 08/02/2017, 06/27/2018   Influenza,inj,quad, With Preservative 06/06/2017   Influenza-Unspecified 08/21/1996   PFIZER Comirnaty(Gray Top)Covid-19 Tri-Sucrose Vaccine 03/03/2021   PFIZER(Purple Top)SARS-COV-2 Vaccination 12/13/2019, 01/03/2020   PPD Test 02/18/2016   Pneumococcal Polysaccharide-23 05/22/2002   Tdap 06/17/2011   Pertinent  Health Maintenance Due  Topic Date Due   DEXA SCAN  Never done   PNA vac Low Risk Adult (2 of 2 - PCV13) 05/23/2003   INFLUENZA VACCINE  05/04/2021   No flowsheet data found.   Vitals:   03/20/21 1148  BP: 126/79  Pulse:  (!) 101  Temp: (!) 96.8 F (36 C)  Weight: 107 lb 12.8 oz (48.9 kg)  Height: 5' (1.524 m)   Body mass index is 21.05 kg/m.  Physical Exam  GENERAL APPEARANCE: Well nourished. In no acute distress. Normal body habitus SKIN:  Skin is warm and dry.  MOUTH and THROAT: Lips are without lesions. Oral mucosa is moist and without lesions.  RESPIRATORY: Breathing is even & unlabored, BS CTAB CARDIAC: Irregular heart rhythm, no murmur,no extra heart sounds, no edema GI: Abdomen soft, normal BS, no masses, no tenderness NEUROLOGICAL: There is no tremor. Speech is clear. Alert to self, and place, disoriented to time. PSYCHIATRIC:  Affect and behavior are appropriate  Labs reviewed: Recent Labs    04/21/20 0524 04/22/20 0446 04/23/20 0634 04/24/20 0424 02/26/21 0052 02/27/21 0146 02/28/21 0102  NA 139 139 141   < > 137 139 139  K 3.6 3.3* 4.3   < > 4.1 3.5 3.6  CL 95* 92* 97*   < > 105 105 107  CO2 35* 38* 35*   < > 23 27 25   GLUCOSE 94 98 95   < >  116* 108* 111*  BUN 19 18 19    < > 28* 25* 25*  CREATININE 1.13* 1.12* 1.15*   < > 1.16* 1.19* 1.05*  CALCIUM 8.7* 8.7* 9.3   < > 8.3* 8.4* 8.6*  MG 1.9 2.0 2.1  --   --   --   --   PHOS 3.2 3.9 4.4  --   --   --   --    < > = values in this interval not displayed.   Recent Labs    04/21/20 0524 04/22/20 0446 04/23/20 0634  AST 35 39 50*  ALT 24 27 36  ALKPHOS 47 47 46  BILITOT 0.7 0.8 0.4  PROT 6.7 6.8 6.6  ALBUMIN 2.2* 2.3* 2.4*   Recent Labs    04/22/20 0446 04/23/20 0634 04/24/20 0424 02/22/21 1334 02/22/21 1700 02/25/21 0037 02/26/21 0052 02/28/21 0102  WBC 6.6 6.0   < > 17.9*   < > 10.5 11.7* 9.0  NEUTROABS 4.6 3.9  --  16.0*  --   --   --   --   HGB 10.0* 9.7*   < > 14.1   < > 9.5* 10.0* 9.2*  HCT 31.8* 31.7*   < > 44.5   < > 29.7* 31.3* 28.3*  MCV 82.4 82.8   < > 87.9   < > 86.8 87.4 86.8  PLT 257 242   < > 226   < > PLATELET CLUMPS NOTED ON SMEAR, COUNT APPEARS DECREASED 123* 145*   < > = values in this  interval not displayed.   Lab Results  Component Value Date   TSH 0.583 02/25/2021   No results found for: HGBA1C Lab Results  Component Value Date   CHOL 106 02/20/2016   HDL 31 (A) 02/20/2016   LDLCALC 56 02/20/2016   TRIG 92 02/20/2016    Significant Diagnostic Results in last 30 days:  CT Head Wo Contrast  Result Date: 02/22/2021 CLINICAL DATA:  Fall, head trauma EXAM: CT HEAD WITHOUT CONTRAST TECHNIQUE: Contiguous axial images were obtained from the base of the skull through the vertex without intravenous contrast. COMPARISON:  02/07/2018 FINDINGS: Brain: No evidence of acute infarction, hemorrhage, hydrocephalus, extra-axial collection or mass lesion/mass effect. Advanced low-density changes within the periventricular and subcortical white matter compatible with chronic microvascular ischemic change. Mild diffuse cerebral volume loss. Vascular: Atherosclerotic calcifications involving the large vessels of the skull base. No unexpected hyperdense vessel. Skull: Normal. Negative for fracture or focal lesion. Sinuses/Orbits: No acute finding. Other: Negative for scalp hematoma. IMPRESSION: 1. No acute intracranial findings. 2. Chronic microvascular ischemic change and cerebral volume loss. Electronically Signed   By: Davina Poke D.O.   On: 02/22/2021 14:16   CT PELVIS WO CONTRAST  Result Date: 02/22/2021 CLINICAL DATA:  Unwitnessed fall in a bathroom today. Right hip pain. EXAM: CT PELVIS WITHOUT CONTRAST TECHNIQUE: Multidetector CT imaging of the pelvis was performed following the standard protocol without intravenous contrast. COMPARISON:  Current right hip radiographs. FINDINGS: Musculoskeletal: Right pelvic fractures. There is a nondisplaced, coronal oblique fracture extending across the right ilium from the inferior margin of the SI joint 2 the level of the posterosuperior right acetabulum. Another fracture crosses the pubis at its junction with the medial acetabulum, displaced by  5 mm. A third fracture is noted of the inferior pubic ramus at the level of the abductor muscle origins. No other fractures. No bone lesions. Hip joints, SI joints and pubic symphysis are normally aligned. There is soft  tissue hemorrhage at lies medial to the right pubis and anterior medial right acetabulum, mildly indenting the bladder. Urinary Tract: No bladder mass, wall thickening or stone. Visualized ureters normal in course and in caliber. Bowel: Rectum moderately distended with stool. Multiple sigmoid colon diverticula. No bowel wall thickening or inflammation. Vascular/Lymphatic: Aortoiliac atherosclerotic calcifications stable from the previous CT. No enlarged lymph nodes. Reproductive:  Status post hysterectomy.  No pelvic masses. Other:  None. IMPRESSION: 1. Fractures of the right hemipelvis as detailed above, involving the right ileum, junction of the right pubis and anteromedial acetabulum and of the inferior right pubic ramus. Associated extraperitoneal hemorrhage along the right anterior inferior pelvis. 2. No fracture of the right proximal femur. No dislocation. No bone lesions. Electronically Signed   By: Lajean Manes M.D.   On: 02/22/2021 12:27   DG Chest Port 1 View  Result Date: 02/23/2021 CLINICAL DATA:  Shortness of breath, history CHF, hypertension, atrial fibrillation EXAM: PORTABLE CHEST 1 VIEW COMPARISON:  Portable exam 1824 hours compared to 02/22/2021 FINDINGS: Kyphotic positioning with artifacts project over LEFT chest. Enlargement of cardiac silhouette with slight vascular congestion. Atherosclerotic calcification aorta. Mild perihilar infiltrates likely pulmonary edema. No pleural effusion or pneumothorax. Marked osseous demineralization. IMPRESSION: Enlargement of cardiac silhouette with suspect mild pulmonary edema. Aortic Atherosclerosis (ICD10-I70.0). Electronically Signed   By: Lavonia Dana M.D.   On: 02/23/2021 18:41   DG Chest Port 1 View  Result Date:  02/22/2021 CLINICAL DATA:  Fall EXAM: PORTABLE CHEST 1 VIEW COMPARISON:  04/23/2020 FINDINGS: Cardiomegaly, vascular congestion. No overt edema, confluent opacities or effusions. Aortic atherosclerosis. No acute bony abnormality. IMPRESSION: Cardiomegaly, vascular congestion. Aortic atherosclerosis. Electronically Signed   By: Rolm Baptise M.D.   On: 02/22/2021 14:11   DG Knee Right Port  Result Date: 02/23/2021 CLINICAL DATA:  87 year old with acute knee pain. EXAM: PORTABLE RIGHT KNEE - 1-2 VIEW COMPARISON:  None. FINDINGS: Negative for fracture or dislocation. Extensive osteophytosis in the lateral knee compartment and patellofemoral compartment. Question a small joint effusion. Diffuse vascular calcifications. IMPRESSION: Osteoarthritis in the right knee without acute bone abnormality. Electronically Signed   By: Markus Daft M.D.   On: 02/23/2021 11:54   ECHOCARDIOGRAM COMPLETE  Result Date: 02/24/2021    ECHOCARDIOGRAM REPORT   Patient Name:   Kelli Peterson Date of Exam: 02/24/2021 Medical Rec #:  [id number]        Height:       60.0 in Accession #:    [id number]       Weight:       111.8 lb Date of Birth:  08-07-1930         BSA:          1.458 m Patient Age:    30 years         BP:           101/67 mmHg Patient Gender: F                HR:           95 bpm. Exam Location:  Inpatient Procedure: 2D Echo, Cardiac Doppler and Color Doppler Indications:    R94.31 Abnormal EKG  History:        Patient has prior history of Echocardiogram examinations, most                 recent 04/16/2020. CHF, Pulmonary HTN, Arrythmias:Atrial  Fibrillation; Risk Factors:Hypertension and Dyslipidemia. CKD.                 Cancer.  Sonographer:    Jonelle Sidle Dance Referring Phys: [id number] Urie Daggett IMPRESSIONS  1. Left ventricular ejection fraction, by estimation, is 55 to 60%. The left ventricle has normal function. The left ventricle has no regional wall motion abnormalities. There is mild left ventricular  hypertrophy. Left ventricular diastolic parameters are consistent with Grade III diastolic dysfunction (restrictive).  2. Right ventricular systolic function is normal. The right ventricular size is normal. There is moderately elevated pulmonary artery systolic pressure. The estimated right ventricular systolic pressure is 82.5 mmHg.  3. Left atrial size was severely dilated.  4. Right atrial size was severely dilated.  5. The mitral valve is normal in structure. Mild mitral valve regurgitation. No evidence of mitral stenosis.  6. Tricuspid valve regurgitation is severe.  7. The aortic valve is normal in structure. There is mild calcification of the aortic valve. There is mild thickening of the aortic valve. Aortic valve regurgitation is not visualized. Mild to moderate aortic valve sclerosis/calcification is present, without any evidence of aortic stenosis.  8. The inferior vena cava is normal in size with greater than 50% respiratory variability, suggesting right atrial pressure of 3 mmHg. FINDINGS  Left Ventricle: Left ventricular ejection fraction, by estimation, is 55 to 60%. The left ventricle has normal function. The left ventricle has no regional wall motion abnormalities. The left ventricular internal cavity size was normal in size. There is  mild left ventricular hypertrophy. Left ventricular diastolic parameters are consistent with Grade III diastolic dysfunction (restrictive). Right Ventricle: The right ventricular size is normal. No increase in right ventricular wall thickness. Right ventricular systolic function is normal. There is moderately elevated pulmonary artery systolic pressure. The tricuspid regurgitant velocity is 3.43 m/s, and with an assumed right atrial pressure of 8 mmHg, the estimated right ventricular systolic pressure is 05.3 mmHg. Left Atrium: Left atrial size was severely dilated. Right Atrium: Right atrial size was severely dilated. Pericardium: There is no evidence of pericardial  effusion. Mitral Valve: The mitral valve is normal in structure. Mild mitral valve regurgitation. No evidence of mitral valve stenosis. Tricuspid Valve: The tricuspid valve is normal in structure. Tricuspid valve regurgitation is severe. No evidence of tricuspid stenosis. Aortic Valve: The aortic valve is normal in structure. There is mild calcification of the aortic valve. There is mild thickening of the aortic valve. Aortic valve regurgitation is not visualized. Mild to moderate aortic valve sclerosis/calcification is present, without any evidence of aortic stenosis. Aortic valve mean gradient measures 3.5 mmHg. Aortic valve peak gradient measures 6.8 mmHg. Aortic valve area, by VTI measures 1.09 cm. Pulmonic Valve: The pulmonic valve was normal in structure. Pulmonic valve regurgitation is not visualized. No evidence of pulmonic stenosis. Aorta: The aortic root is normal in size and structure. Venous: The inferior vena cava is normal in size with greater than 50% respiratory variability, suggesting right atrial pressure of 3 mmHg. IAS/Shunts: No atrial level shunt detected by color flow Doppler.  LEFT VENTRICLE PLAX 2D LVIDd:         3.70 cm LVIDs:         2.90 cm LV PW:         1.30 cm LV IVS:        1.20 cm LVOT diam:     1.70 cm LV SV:         26 LV SV Index:  18 LVOT Area:     2.27 cm  LV Volumes (MOD) LV vol d, MOD A2C: 30.1 ml LV vol d, MOD A4C: 31.1 ml LV vol s, MOD A2C: 11.1 ml LV vol s, MOD A4C: 13.2 ml LV SV MOD A2C:     19.0 ml LV SV MOD A4C:     31.1 ml LV SV MOD BP:      18.2 ml RIGHT VENTRICLE          IVC RV Basal diam:  3.00 cm  IVC diam: 2.20 cm TAPSE (M-mode): 1.0 cm LEFT ATRIUM           Index       RIGHT ATRIUM           Index LA diam:      4.30 cm 2.95 cm/m  RA Area:     28.10 cm LA Vol (A2C): 89.7 ml 61.54 ml/m RA Volume:   93.50 ml  64.14 ml/m LA Vol (A4C): 52.3 ml 35.88 ml/m  AORTIC VALVE AV Area (Vmax):    1.15 cm AV Area (Vmean):   1.08 cm AV Area (VTI):     1.09 cm AV Vmax:            130.00 cm/s AV Vmean:          87.750 cm/s AV VTI:            0.236 m AV Peak Grad:      6.8 mmHg AV Mean Grad:      3.5 mmHg LVOT Vmax:         66.10 cm/s LVOT Vmean:        41.600 cm/s LVOT VTI:          0.113 m LVOT/AV VTI ratio: 0.48  AORTA Ao Root diam: 2.80 cm Ao Asc diam:  3.00 cm MITRAL VALVE                TRICUSPID VALVE MV Area (PHT): 5.42 cm     TR Peak grad:   47.1 mmHg MV Decel Time: 140 msec     TR Vmax:        343.00 cm/s MV E velocity: 127.00 cm/s MV A velocity: 34.90 cm/s   SHUNTS MV E/A ratio:  3.64         Systemic VTI:  0.11 m                             Systemic Diam: 1.70 cm Candee Furbish MD Electronically signed by Candee Furbish MD Signature Date/Time: 02/24/2021/4:34:31 PM    Final    DG Hip Unilat  With Pelvis 2-3 Views Right  Result Date: 02/22/2021 CLINICAL DATA:  Unwitnessed fall.  Right hip pain. EXAM: DG HIP (WITH OR WITHOUT PELVIS) 2-3V RIGHT COMPARISON:  02/15/2016. FINDINGS: Fracture of the right pubis adjacent to the medial right acetabulum, minimally displaced, approximately 4 mm. There is a fracture of the inferior pubic, without significant displacement. A subtle fracture line is suggested of the right ilium just lateral to the inferior right SI joint. No other fractures.  Specifically, no right proximal femur fracture. Hip joints, SI joints and pubic symphysis are normally aligned. Skeletal structures are diffusely demineralized. IMPRESSION: 1. Right pelvic fractures. Fracture at the junction of the right pubis with the medial acetabulum, fracture of the inferior right pubic ramus and a questionable fracture of right ilium just lateral to the inferior right SI joint. 2.  No right proximal femur fracture.  No dislocation. Electronically Signed   By: Lajean Manes M.D.   On: 02/22/2021 12:08    Assessment/Plan  1. Multiple closed pelvic fractures with disruption of pelvic circle, with delayed healing, subsequent encounter -  WBAT -Follow-up for home health PT and OT,  for therapeutic strengthening exercises-   continue acetaminophen 500 mg 2 tabs = 1000 mg TID for pain  2. Atrial fibrillation with rapid ventricular response (HCC)  - apixaban (ELIQUIS) 2.5 MG TABS tablet; Take 1 tablet (2.5 mg total) by mouth 2 (two) times daily.  Dispense: 60 tablet; Refill: 0 - diltiazem (CARDIZEM CD) 240 MG 24 hr capsule; Take 1 capsule (240 mg total) by mouth daily.  Dispense: 30 capsule; Refill: 0 - metoprolol succinate (TOPROL-XL) 25 MG 24 hr tablet; Take 2 tablets (50 mg total) by mouth daily.  Dispense: 60 tablet; Refill: 0  3. Chronic diastolic heart failure (HCC)  - potassium chloride (KLOR-CON) 10 MEQ tablet; Take 1 tablet (10 mEq total) by mouth daily.  Dispense: 30 tablet; Refill: 0 - torsemide (DEMADEX) 20 MG tablet; Take 1 tablet (20 mg total) by mouth daily.  Dispense: 30 tablet; Refill: 0  4. Acute kidney injury superimposed on chronic kidney disease [facility]) Lab Results  Component Value Date   NA 139 02/28/2021   K 3.6 02/28/2021   CO2 25 02/28/2021   GLUCOSE 111 (H) 02/28/2021   BUN 25 (H) 02/28/2021   CREATININE 1.05 (H) 02/28/2021   CALCIUM 8.6 (L) 02/28/2021   GFRNONAA 51 (L) 02/28/2021   GFRAA 53 (L) 04/24/2020   -  now at baseline  5. Essential hypertension  -  continue Metoprolol succinate and Cardizem  6. Coronary artery disease involving native coronary artery of native heart without angina pectoris  -  no chest pains - atorvastatin (LIPITOR) 20 MG tablet; Take 1 tablet (20 mg total) by mouth daily.  Dispense: 30 tablet; Refill: 0      I have filled out patient's discharge paperwork and e-prescribed medications.  Patient will have home health PT and OT.  DME provided:  None  Total discharge time: Greater than 30 minutes Greater than 50% was spent in counseling and coordination of care.   Discharge time involved coordination of the discharge process with social worker, nursing staff and therapy department. Medical  justification for home health services verified.   Durenda Age, DNP, MSN, FNP-BC [facility] [phone] (Monday-Friday 8:00 a.m. - 5:00 p.m.) [phone] (after hours)

## 2021-03-24 DIAGNOSIS — I509 Heart failure, unspecified: Secondary | ICD-10-CM | POA: Diagnosis not present

## 2021-03-24 DIAGNOSIS — S329XXA Fracture of unspecified parts of lumbosacral spine and pelvis, initial encounter for closed fracture: Secondary | ICD-10-CM | POA: Diagnosis not present

## 2021-03-24 DIAGNOSIS — Z7901 Long term (current) use of anticoagulants: Secondary | ICD-10-CM | POA: Diagnosis not present

## 2021-03-24 DIAGNOSIS — N1832 Chronic kidney disease, stage 3b: Secondary | ICD-10-CM | POA: Diagnosis not present

## 2021-03-24 DIAGNOSIS — R2681 Unsteadiness on feet: Secondary | ICD-10-CM | POA: Diagnosis not present

## 2021-03-24 DIAGNOSIS — S329XXD Fracture of unspecified parts of lumbosacral spine and pelvis, subsequent encounter for fracture with routine healing: Secondary | ICD-10-CM | POA: Diagnosis not present

## 2021-03-24 DIAGNOSIS — I6930 Unspecified sequelae of cerebral infarction: Secondary | ICD-10-CM | POA: Diagnosis not present

## 2021-03-24 DIAGNOSIS — Z09 Encounter for follow-up examination after completed treatment for conditions other than malignant neoplasm: Secondary | ICD-10-CM | POA: Diagnosis not present

## 2021-03-24 DIAGNOSIS — I4891 Unspecified atrial fibrillation: Secondary | ICD-10-CM | POA: Diagnosis not present

## 2021-03-25 ENCOUNTER — Other Ambulatory Visit: Payer: Self-pay | Admitting: Adult Health

## 2021-03-25 DIAGNOSIS — I5032 Chronic diastolic (congestive) heart failure: Secondary | ICD-10-CM

## 2021-03-31 DIAGNOSIS — E785 Hyperlipidemia, unspecified: Secondary | ICD-10-CM | POA: Diagnosis not present

## 2021-03-31 DIAGNOSIS — I4821 Permanent atrial fibrillation: Secondary | ICD-10-CM | POA: Diagnosis not present

## 2021-03-31 DIAGNOSIS — D509 Iron deficiency anemia, unspecified: Secondary | ICD-10-CM | POA: Diagnosis not present

## 2021-03-31 DIAGNOSIS — Z86718 Personal history of other venous thrombosis and embolism: Secondary | ICD-10-CM | POA: Diagnosis not present

## 2021-03-31 DIAGNOSIS — I130 Hypertensive heart and chronic kidney disease with heart failure and stage 1 through stage 4 chronic kidney disease, or unspecified chronic kidney disease: Secondary | ICD-10-CM | POA: Diagnosis not present

## 2021-03-31 DIAGNOSIS — I071 Rheumatic tricuspid insufficiency: Secondary | ICD-10-CM | POA: Diagnosis not present

## 2021-03-31 DIAGNOSIS — I5032 Chronic diastolic (congestive) heart failure: Secondary | ICD-10-CM | POA: Diagnosis not present

## 2021-03-31 DIAGNOSIS — Z8673 Personal history of transient ischemic attack (TIA), and cerebral infarction without residual deficits: Secondary | ICD-10-CM | POA: Diagnosis not present

## 2021-03-31 DIAGNOSIS — S32810G Multiple fractures of pelvis with stable disruption of pelvic ring, subsequent encounter for fracture with delayed healing: Secondary | ICD-10-CM | POA: Diagnosis not present

## 2021-03-31 DIAGNOSIS — I2510 Atherosclerotic heart disease of native coronary artery without angina pectoris: Secondary | ICD-10-CM | POA: Diagnosis not present

## 2021-03-31 DIAGNOSIS — Z87440 Personal history of urinary (tract) infections: Secondary | ICD-10-CM | POA: Diagnosis not present

## 2021-03-31 DIAGNOSIS — N1832 Chronic kidney disease, stage 3b: Secondary | ICD-10-CM | POA: Diagnosis not present

## 2021-04-03 DIAGNOSIS — I5032 Chronic diastolic (congestive) heart failure: Secondary | ICD-10-CM | POA: Diagnosis not present

## 2021-04-03 DIAGNOSIS — I071 Rheumatic tricuspid insufficiency: Secondary | ICD-10-CM | POA: Diagnosis not present

## 2021-04-03 DIAGNOSIS — I2510 Atherosclerotic heart disease of native coronary artery without angina pectoris: Secondary | ICD-10-CM | POA: Diagnosis not present

## 2021-04-03 DIAGNOSIS — S32810G Multiple fractures of pelvis with stable disruption of pelvic ring, subsequent encounter for fracture with delayed healing: Secondary | ICD-10-CM | POA: Diagnosis not present

## 2021-04-03 DIAGNOSIS — Z86718 Personal history of other venous thrombosis and embolism: Secondary | ICD-10-CM | POA: Diagnosis not present

## 2021-04-03 DIAGNOSIS — I4821 Permanent atrial fibrillation: Secondary | ICD-10-CM | POA: Diagnosis not present

## 2021-04-03 DIAGNOSIS — Z87440 Personal history of urinary (tract) infections: Secondary | ICD-10-CM | POA: Diagnosis not present

## 2021-04-03 DIAGNOSIS — I130 Hypertensive heart and chronic kidney disease with heart failure and stage 1 through stage 4 chronic kidney disease, or unspecified chronic kidney disease: Secondary | ICD-10-CM | POA: Diagnosis not present

## 2021-04-03 DIAGNOSIS — E785 Hyperlipidemia, unspecified: Secondary | ICD-10-CM | POA: Diagnosis not present

## 2021-04-03 DIAGNOSIS — N1832 Chronic kidney disease, stage 3b: Secondary | ICD-10-CM | POA: Diagnosis not present

## 2021-04-03 DIAGNOSIS — Z8673 Personal history of transient ischemic attack (TIA), and cerebral infarction without residual deficits: Secondary | ICD-10-CM | POA: Diagnosis not present

## 2021-04-03 DIAGNOSIS — D509 Iron deficiency anemia, unspecified: Secondary | ICD-10-CM | POA: Diagnosis not present

## 2021-04-08 DIAGNOSIS — D509 Iron deficiency anemia, unspecified: Secondary | ICD-10-CM | POA: Diagnosis not present

## 2021-04-08 DIAGNOSIS — Z86718 Personal history of other venous thrombosis and embolism: Secondary | ICD-10-CM | POA: Diagnosis not present

## 2021-04-08 DIAGNOSIS — S32810G Multiple fractures of pelvis with stable disruption of pelvic ring, subsequent encounter for fracture with delayed healing: Secondary | ICD-10-CM | POA: Diagnosis not present

## 2021-04-08 DIAGNOSIS — I2510 Atherosclerotic heart disease of native coronary artery without angina pectoris: Secondary | ICD-10-CM | POA: Diagnosis not present

## 2021-04-08 DIAGNOSIS — N1832 Chronic kidney disease, stage 3b: Secondary | ICD-10-CM | POA: Diagnosis not present

## 2021-04-08 DIAGNOSIS — I4821 Permanent atrial fibrillation: Secondary | ICD-10-CM | POA: Diagnosis not present

## 2021-04-08 DIAGNOSIS — Z87440 Personal history of urinary (tract) infections: Secondary | ICD-10-CM | POA: Diagnosis not present

## 2021-04-08 DIAGNOSIS — I130 Hypertensive heart and chronic kidney disease with heart failure and stage 1 through stage 4 chronic kidney disease, or unspecified chronic kidney disease: Secondary | ICD-10-CM | POA: Diagnosis not present

## 2021-04-08 DIAGNOSIS — I5032 Chronic diastolic (congestive) heart failure: Secondary | ICD-10-CM | POA: Diagnosis not present

## 2021-04-08 DIAGNOSIS — I071 Rheumatic tricuspid insufficiency: Secondary | ICD-10-CM | POA: Diagnosis not present

## 2021-04-08 DIAGNOSIS — E785 Hyperlipidemia, unspecified: Secondary | ICD-10-CM | POA: Diagnosis not present

## 2021-04-08 DIAGNOSIS — Z8673 Personal history of transient ischemic attack (TIA), and cerebral infarction without residual deficits: Secondary | ICD-10-CM | POA: Diagnosis not present

## 2021-04-10 DIAGNOSIS — D509 Iron deficiency anemia, unspecified: Secondary | ICD-10-CM | POA: Diagnosis not present

## 2021-04-10 DIAGNOSIS — N1832 Chronic kidney disease, stage 3b: Secondary | ICD-10-CM | POA: Diagnosis not present

## 2021-04-10 DIAGNOSIS — E785 Hyperlipidemia, unspecified: Secondary | ICD-10-CM | POA: Diagnosis not present

## 2021-04-10 DIAGNOSIS — I2510 Atherosclerotic heart disease of native coronary artery without angina pectoris: Secondary | ICD-10-CM | POA: Diagnosis not present

## 2021-04-10 DIAGNOSIS — Z87440 Personal history of urinary (tract) infections: Secondary | ICD-10-CM | POA: Diagnosis not present

## 2021-04-10 DIAGNOSIS — I071 Rheumatic tricuspid insufficiency: Secondary | ICD-10-CM | POA: Diagnosis not present

## 2021-04-10 DIAGNOSIS — I4821 Permanent atrial fibrillation: Secondary | ICD-10-CM | POA: Diagnosis not present

## 2021-04-10 DIAGNOSIS — I130 Hypertensive heart and chronic kidney disease with heart failure and stage 1 through stage 4 chronic kidney disease, or unspecified chronic kidney disease: Secondary | ICD-10-CM | POA: Diagnosis not present

## 2021-04-10 DIAGNOSIS — I5032 Chronic diastolic (congestive) heart failure: Secondary | ICD-10-CM | POA: Diagnosis not present

## 2021-04-10 DIAGNOSIS — S32810G Multiple fractures of pelvis with stable disruption of pelvic ring, subsequent encounter for fracture with delayed healing: Secondary | ICD-10-CM | POA: Diagnosis not present

## 2021-04-10 DIAGNOSIS — Z86718 Personal history of other venous thrombosis and embolism: Secondary | ICD-10-CM | POA: Diagnosis not present

## 2021-04-10 DIAGNOSIS — Z8673 Personal history of transient ischemic attack (TIA), and cerebral infarction without residual deficits: Secondary | ICD-10-CM | POA: Diagnosis not present

## 2021-04-13 DIAGNOSIS — S32810G Multiple fractures of pelvis with stable disruption of pelvic ring, subsequent encounter for fracture with delayed healing: Secondary | ICD-10-CM | POA: Diagnosis not present

## 2021-04-13 DIAGNOSIS — E785 Hyperlipidemia, unspecified: Secondary | ICD-10-CM | POA: Diagnosis not present

## 2021-04-13 DIAGNOSIS — N1832 Chronic kidney disease, stage 3b: Secondary | ICD-10-CM | POA: Diagnosis not present

## 2021-04-13 DIAGNOSIS — Z87440 Personal history of urinary (tract) infections: Secondary | ICD-10-CM | POA: Diagnosis not present

## 2021-04-13 DIAGNOSIS — D509 Iron deficiency anemia, unspecified: Secondary | ICD-10-CM | POA: Diagnosis not present

## 2021-04-13 DIAGNOSIS — I4821 Permanent atrial fibrillation: Secondary | ICD-10-CM | POA: Diagnosis not present

## 2021-04-13 DIAGNOSIS — Z8673 Personal history of transient ischemic attack (TIA), and cerebral infarction without residual deficits: Secondary | ICD-10-CM | POA: Diagnosis not present

## 2021-04-13 DIAGNOSIS — I071 Rheumatic tricuspid insufficiency: Secondary | ICD-10-CM | POA: Diagnosis not present

## 2021-04-13 DIAGNOSIS — I130 Hypertensive heart and chronic kidney disease with heart failure and stage 1 through stage 4 chronic kidney disease, or unspecified chronic kidney disease: Secondary | ICD-10-CM | POA: Diagnosis not present

## 2021-04-13 DIAGNOSIS — I5032 Chronic diastolic (congestive) heart failure: Secondary | ICD-10-CM | POA: Diagnosis not present

## 2021-04-13 DIAGNOSIS — Z86718 Personal history of other venous thrombosis and embolism: Secondary | ICD-10-CM | POA: Diagnosis not present

## 2021-04-13 DIAGNOSIS — I2510 Atherosclerotic heart disease of native coronary artery without angina pectoris: Secondary | ICD-10-CM | POA: Diagnosis not present

## 2021-04-14 DIAGNOSIS — Z86718 Personal history of other venous thrombosis and embolism: Secondary | ICD-10-CM | POA: Diagnosis not present

## 2021-04-14 DIAGNOSIS — I071 Rheumatic tricuspid insufficiency: Secondary | ICD-10-CM | POA: Diagnosis not present

## 2021-04-14 DIAGNOSIS — S32810G Multiple fractures of pelvis with stable disruption of pelvic ring, subsequent encounter for fracture with delayed healing: Secondary | ICD-10-CM | POA: Diagnosis not present

## 2021-04-14 DIAGNOSIS — Z87440 Personal history of urinary (tract) infections: Secondary | ICD-10-CM | POA: Diagnosis not present

## 2021-04-14 DIAGNOSIS — Z8673 Personal history of transient ischemic attack (TIA), and cerebral infarction without residual deficits: Secondary | ICD-10-CM | POA: Diagnosis not present

## 2021-04-14 DIAGNOSIS — D509 Iron deficiency anemia, unspecified: Secondary | ICD-10-CM | POA: Diagnosis not present

## 2021-04-14 DIAGNOSIS — I4821 Permanent atrial fibrillation: Secondary | ICD-10-CM | POA: Diagnosis not present

## 2021-04-14 DIAGNOSIS — I130 Hypertensive heart and chronic kidney disease with heart failure and stage 1 through stage 4 chronic kidney disease, or unspecified chronic kidney disease: Secondary | ICD-10-CM | POA: Diagnosis not present

## 2021-04-14 DIAGNOSIS — E785 Hyperlipidemia, unspecified: Secondary | ICD-10-CM | POA: Diagnosis not present

## 2021-04-14 DIAGNOSIS — I2510 Atherosclerotic heart disease of native coronary artery without angina pectoris: Secondary | ICD-10-CM | POA: Diagnosis not present

## 2021-04-14 DIAGNOSIS — I5032 Chronic diastolic (congestive) heart failure: Secondary | ICD-10-CM | POA: Diagnosis not present

## 2021-04-14 DIAGNOSIS — N1832 Chronic kidney disease, stage 3b: Secondary | ICD-10-CM | POA: Diagnosis not present

## 2021-04-15 DIAGNOSIS — Z8673 Personal history of transient ischemic attack (TIA), and cerebral infarction without residual deficits: Secondary | ICD-10-CM | POA: Diagnosis not present

## 2021-04-15 DIAGNOSIS — I2510 Atherosclerotic heart disease of native coronary artery without angina pectoris: Secondary | ICD-10-CM | POA: Diagnosis not present

## 2021-04-15 DIAGNOSIS — Z86718 Personal history of other venous thrombosis and embolism: Secondary | ICD-10-CM | POA: Diagnosis not present

## 2021-04-15 DIAGNOSIS — I5032 Chronic diastolic (congestive) heart failure: Secondary | ICD-10-CM | POA: Diagnosis not present

## 2021-04-15 DIAGNOSIS — D509 Iron deficiency anemia, unspecified: Secondary | ICD-10-CM | POA: Diagnosis not present

## 2021-04-15 DIAGNOSIS — I130 Hypertensive heart and chronic kidney disease with heart failure and stage 1 through stage 4 chronic kidney disease, or unspecified chronic kidney disease: Secondary | ICD-10-CM | POA: Diagnosis not present

## 2021-04-15 DIAGNOSIS — S32810G Multiple fractures of pelvis with stable disruption of pelvic ring, subsequent encounter for fracture with delayed healing: Secondary | ICD-10-CM | POA: Diagnosis not present

## 2021-04-15 DIAGNOSIS — Z87440 Personal history of urinary (tract) infections: Secondary | ICD-10-CM | POA: Diagnosis not present

## 2021-04-15 DIAGNOSIS — I4821 Permanent atrial fibrillation: Secondary | ICD-10-CM | POA: Diagnosis not present

## 2021-04-15 DIAGNOSIS — E785 Hyperlipidemia, unspecified: Secondary | ICD-10-CM | POA: Diagnosis not present

## 2021-04-15 DIAGNOSIS — N1832 Chronic kidney disease, stage 3b: Secondary | ICD-10-CM | POA: Diagnosis not present

## 2021-04-15 DIAGNOSIS — I071 Rheumatic tricuspid insufficiency: Secondary | ICD-10-CM | POA: Diagnosis not present

## 2021-04-16 ENCOUNTER — Other Ambulatory Visit: Payer: Self-pay | Admitting: Adult Health

## 2021-04-16 DIAGNOSIS — I5032 Chronic diastolic (congestive) heart failure: Secondary | ICD-10-CM

## 2021-04-16 DIAGNOSIS — I4891 Unspecified atrial fibrillation: Secondary | ICD-10-CM

## 2021-04-21 DIAGNOSIS — I4891 Unspecified atrial fibrillation: Secondary | ICD-10-CM | POA: Diagnosis not present

## 2021-04-21 DIAGNOSIS — S329XXD Fracture of unspecified parts of lumbosacral spine and pelvis, subsequent encounter for fracture with routine healing: Secondary | ICD-10-CM | POA: Diagnosis not present

## 2021-04-21 DIAGNOSIS — N1832 Chronic kidney disease, stage 3b: Secondary | ICD-10-CM | POA: Diagnosis not present

## 2021-04-21 DIAGNOSIS — H401122 Primary open-angle glaucoma, left eye, moderate stage: Secondary | ICD-10-CM | POA: Diagnosis not present

## 2021-04-21 DIAGNOSIS — H401113 Primary open-angle glaucoma, right eye, severe stage: Secondary | ICD-10-CM | POA: Diagnosis not present

## 2021-04-21 DIAGNOSIS — I509 Heart failure, unspecified: Secondary | ICD-10-CM | POA: Diagnosis not present

## 2021-04-21 DIAGNOSIS — Z7901 Long term (current) use of anticoagulants: Secondary | ICD-10-CM | POA: Diagnosis not present

## 2021-04-22 DIAGNOSIS — Z8673 Personal history of transient ischemic attack (TIA), and cerebral infarction without residual deficits: Secondary | ICD-10-CM | POA: Diagnosis not present

## 2021-04-22 DIAGNOSIS — I2510 Atherosclerotic heart disease of native coronary artery without angina pectoris: Secondary | ICD-10-CM | POA: Diagnosis not present

## 2021-04-22 DIAGNOSIS — I130 Hypertensive heart and chronic kidney disease with heart failure and stage 1 through stage 4 chronic kidney disease, or unspecified chronic kidney disease: Secondary | ICD-10-CM | POA: Diagnosis not present

## 2021-04-22 DIAGNOSIS — I4821 Permanent atrial fibrillation: Secondary | ICD-10-CM | POA: Diagnosis not present

## 2021-04-22 DIAGNOSIS — N1832 Chronic kidney disease, stage 3b: Secondary | ICD-10-CM | POA: Diagnosis not present

## 2021-04-22 DIAGNOSIS — I071 Rheumatic tricuspid insufficiency: Secondary | ICD-10-CM | POA: Diagnosis not present

## 2021-04-22 DIAGNOSIS — S32810G Multiple fractures of pelvis with stable disruption of pelvic ring, subsequent encounter for fracture with delayed healing: Secondary | ICD-10-CM | POA: Diagnosis not present

## 2021-04-22 DIAGNOSIS — Z87440 Personal history of urinary (tract) infections: Secondary | ICD-10-CM | POA: Diagnosis not present

## 2021-04-22 DIAGNOSIS — E785 Hyperlipidemia, unspecified: Secondary | ICD-10-CM | POA: Diagnosis not present

## 2021-04-22 DIAGNOSIS — I5032 Chronic diastolic (congestive) heart failure: Secondary | ICD-10-CM | POA: Diagnosis not present

## 2021-04-22 DIAGNOSIS — D509 Iron deficiency anemia, unspecified: Secondary | ICD-10-CM | POA: Diagnosis not present

## 2021-04-22 DIAGNOSIS — Z86718 Personal history of other venous thrombosis and embolism: Secondary | ICD-10-CM | POA: Diagnosis not present

## 2021-04-24 DIAGNOSIS — I130 Hypertensive heart and chronic kidney disease with heart failure and stage 1 through stage 4 chronic kidney disease, or unspecified chronic kidney disease: Secondary | ICD-10-CM | POA: Diagnosis not present

## 2021-04-24 DIAGNOSIS — Z86718 Personal history of other venous thrombosis and embolism: Secondary | ICD-10-CM | POA: Diagnosis not present

## 2021-04-24 DIAGNOSIS — Z87440 Personal history of urinary (tract) infections: Secondary | ICD-10-CM | POA: Diagnosis not present

## 2021-04-24 DIAGNOSIS — I4821 Permanent atrial fibrillation: Secondary | ICD-10-CM | POA: Diagnosis not present

## 2021-04-24 DIAGNOSIS — E785 Hyperlipidemia, unspecified: Secondary | ICD-10-CM | POA: Diagnosis not present

## 2021-04-24 DIAGNOSIS — N1832 Chronic kidney disease, stage 3b: Secondary | ICD-10-CM | POA: Diagnosis not present

## 2021-04-24 DIAGNOSIS — D509 Iron deficiency anemia, unspecified: Secondary | ICD-10-CM | POA: Diagnosis not present

## 2021-04-24 DIAGNOSIS — Z8673 Personal history of transient ischemic attack (TIA), and cerebral infarction without residual deficits: Secondary | ICD-10-CM | POA: Diagnosis not present

## 2021-04-24 DIAGNOSIS — I071 Rheumatic tricuspid insufficiency: Secondary | ICD-10-CM | POA: Diagnosis not present

## 2021-04-24 DIAGNOSIS — I5032 Chronic diastolic (congestive) heart failure: Secondary | ICD-10-CM | POA: Diagnosis not present

## 2021-04-24 DIAGNOSIS — I2510 Atherosclerotic heart disease of native coronary artery without angina pectoris: Secondary | ICD-10-CM | POA: Diagnosis not present

## 2021-04-24 DIAGNOSIS — S32810G Multiple fractures of pelvis with stable disruption of pelvic ring, subsequent encounter for fracture with delayed healing: Secondary | ICD-10-CM | POA: Diagnosis not present

## 2021-04-28 DIAGNOSIS — Z87440 Personal history of urinary (tract) infections: Secondary | ICD-10-CM | POA: Diagnosis not present

## 2021-04-28 DIAGNOSIS — I071 Rheumatic tricuspid insufficiency: Secondary | ICD-10-CM | POA: Diagnosis not present

## 2021-04-28 DIAGNOSIS — E785 Hyperlipidemia, unspecified: Secondary | ICD-10-CM | POA: Diagnosis not present

## 2021-04-28 DIAGNOSIS — I2510 Atherosclerotic heart disease of native coronary artery without angina pectoris: Secondary | ICD-10-CM | POA: Diagnosis not present

## 2021-04-28 DIAGNOSIS — I4821 Permanent atrial fibrillation: Secondary | ICD-10-CM | POA: Diagnosis not present

## 2021-04-28 DIAGNOSIS — D509 Iron deficiency anemia, unspecified: Secondary | ICD-10-CM | POA: Diagnosis not present

## 2021-04-28 DIAGNOSIS — N1832 Chronic kidney disease, stage 3b: Secondary | ICD-10-CM | POA: Diagnosis not present

## 2021-04-28 DIAGNOSIS — Z8673 Personal history of transient ischemic attack (TIA), and cerebral infarction without residual deficits: Secondary | ICD-10-CM | POA: Diagnosis not present

## 2021-04-28 DIAGNOSIS — I5032 Chronic diastolic (congestive) heart failure: Secondary | ICD-10-CM | POA: Diagnosis not present

## 2021-04-28 DIAGNOSIS — Z86718 Personal history of other venous thrombosis and embolism: Secondary | ICD-10-CM | POA: Diagnosis not present

## 2021-04-28 DIAGNOSIS — S32810G Multiple fractures of pelvis with stable disruption of pelvic ring, subsequent encounter for fracture with delayed healing: Secondary | ICD-10-CM | POA: Diagnosis not present

## 2021-04-28 DIAGNOSIS — I130 Hypertensive heart and chronic kidney disease with heart failure and stage 1 through stage 4 chronic kidney disease, or unspecified chronic kidney disease: Secondary | ICD-10-CM | POA: Diagnosis not present

## 2021-04-30 DIAGNOSIS — I130 Hypertensive heart and chronic kidney disease with heart failure and stage 1 through stage 4 chronic kidney disease, or unspecified chronic kidney disease: Secondary | ICD-10-CM | POA: Diagnosis not present

## 2021-04-30 DIAGNOSIS — S32810G Multiple fractures of pelvis with stable disruption of pelvic ring, subsequent encounter for fracture with delayed healing: Secondary | ICD-10-CM | POA: Diagnosis not present

## 2021-04-30 DIAGNOSIS — I071 Rheumatic tricuspid insufficiency: Secondary | ICD-10-CM | POA: Diagnosis not present

## 2021-04-30 DIAGNOSIS — Z87440 Personal history of urinary (tract) infections: Secondary | ICD-10-CM | POA: Diagnosis not present

## 2021-04-30 DIAGNOSIS — I2510 Atherosclerotic heart disease of native coronary artery without angina pectoris: Secondary | ICD-10-CM | POA: Diagnosis not present

## 2021-04-30 DIAGNOSIS — E785 Hyperlipidemia, unspecified: Secondary | ICD-10-CM | POA: Diagnosis not present

## 2021-04-30 DIAGNOSIS — Z8673 Personal history of transient ischemic attack (TIA), and cerebral infarction without residual deficits: Secondary | ICD-10-CM | POA: Diagnosis not present

## 2021-04-30 DIAGNOSIS — N1832 Chronic kidney disease, stage 3b: Secondary | ICD-10-CM | POA: Diagnosis not present

## 2021-04-30 DIAGNOSIS — I4821 Permanent atrial fibrillation: Secondary | ICD-10-CM | POA: Diagnosis not present

## 2021-04-30 DIAGNOSIS — Z86718 Personal history of other venous thrombosis and embolism: Secondary | ICD-10-CM | POA: Diagnosis not present

## 2021-04-30 DIAGNOSIS — D509 Iron deficiency anemia, unspecified: Secondary | ICD-10-CM | POA: Diagnosis not present

## 2021-04-30 DIAGNOSIS — I5032 Chronic diastolic (congestive) heart failure: Secondary | ICD-10-CM | POA: Diagnosis not present

## 2021-05-04 DIAGNOSIS — D509 Iron deficiency anemia, unspecified: Secondary | ICD-10-CM | POA: Diagnosis not present

## 2021-05-04 DIAGNOSIS — Z8673 Personal history of transient ischemic attack (TIA), and cerebral infarction without residual deficits: Secondary | ICD-10-CM | POA: Diagnosis not present

## 2021-05-04 DIAGNOSIS — I2510 Atherosclerotic heart disease of native coronary artery without angina pectoris: Secondary | ICD-10-CM | POA: Diagnosis not present

## 2021-05-04 DIAGNOSIS — Z87440 Personal history of urinary (tract) infections: Secondary | ICD-10-CM | POA: Diagnosis not present

## 2021-05-04 DIAGNOSIS — I4821 Permanent atrial fibrillation: Secondary | ICD-10-CM | POA: Diagnosis not present

## 2021-05-04 DIAGNOSIS — I130 Hypertensive heart and chronic kidney disease with heart failure and stage 1 through stage 4 chronic kidney disease, or unspecified chronic kidney disease: Secondary | ICD-10-CM | POA: Diagnosis not present

## 2021-05-04 DIAGNOSIS — S32810G Multiple fractures of pelvis with stable disruption of pelvic ring, subsequent encounter for fracture with delayed healing: Secondary | ICD-10-CM | POA: Diagnosis not present

## 2021-05-04 DIAGNOSIS — Z86718 Personal history of other venous thrombosis and embolism: Secondary | ICD-10-CM | POA: Diagnosis not present

## 2021-05-04 DIAGNOSIS — I071 Rheumatic tricuspid insufficiency: Secondary | ICD-10-CM | POA: Diagnosis not present

## 2021-05-04 DIAGNOSIS — I5032 Chronic diastolic (congestive) heart failure: Secondary | ICD-10-CM | POA: Diagnosis not present

## 2021-05-04 DIAGNOSIS — N1832 Chronic kidney disease, stage 3b: Secondary | ICD-10-CM | POA: Diagnosis not present

## 2021-05-04 DIAGNOSIS — E785 Hyperlipidemia, unspecified: Secondary | ICD-10-CM | POA: Diagnosis not present

## 2021-05-13 DIAGNOSIS — I130 Hypertensive heart and chronic kidney disease with heart failure and stage 1 through stage 4 chronic kidney disease, or unspecified chronic kidney disease: Secondary | ICD-10-CM | POA: Diagnosis not present

## 2021-05-13 DIAGNOSIS — I5032 Chronic diastolic (congestive) heart failure: Secondary | ICD-10-CM | POA: Diagnosis not present

## 2021-05-13 DIAGNOSIS — Z86718 Personal history of other venous thrombosis and embolism: Secondary | ICD-10-CM | POA: Diagnosis not present

## 2021-05-13 DIAGNOSIS — I4821 Permanent atrial fibrillation: Secondary | ICD-10-CM | POA: Diagnosis not present

## 2021-05-13 DIAGNOSIS — D509 Iron deficiency anemia, unspecified: Secondary | ICD-10-CM | POA: Diagnosis not present

## 2021-05-13 DIAGNOSIS — I2510 Atherosclerotic heart disease of native coronary artery without angina pectoris: Secondary | ICD-10-CM | POA: Diagnosis not present

## 2021-05-13 DIAGNOSIS — N1832 Chronic kidney disease, stage 3b: Secondary | ICD-10-CM | POA: Diagnosis not present

## 2021-05-13 DIAGNOSIS — E785 Hyperlipidemia, unspecified: Secondary | ICD-10-CM | POA: Diagnosis not present

## 2021-05-13 DIAGNOSIS — Z87440 Personal history of urinary (tract) infections: Secondary | ICD-10-CM | POA: Diagnosis not present

## 2021-05-13 DIAGNOSIS — Z8673 Personal history of transient ischemic attack (TIA), and cerebral infarction without residual deficits: Secondary | ICD-10-CM | POA: Diagnosis not present

## 2021-05-13 DIAGNOSIS — I071 Rheumatic tricuspid insufficiency: Secondary | ICD-10-CM | POA: Diagnosis not present

## 2021-05-13 DIAGNOSIS — S32810G Multiple fractures of pelvis with stable disruption of pelvic ring, subsequent encounter for fracture with delayed healing: Secondary | ICD-10-CM | POA: Diagnosis not present

## 2021-05-15 ENCOUNTER — Other Ambulatory Visit: Payer: Self-pay | Admitting: Adult Health

## 2021-05-15 DIAGNOSIS — I4891 Unspecified atrial fibrillation: Secondary | ICD-10-CM

## 2021-05-15 DIAGNOSIS — I5032 Chronic diastolic (congestive) heart failure: Secondary | ICD-10-CM

## 2021-05-21 ENCOUNTER — Other Ambulatory Visit: Payer: Self-pay | Admitting: Adult Health

## 2021-05-21 DIAGNOSIS — I4891 Unspecified atrial fibrillation: Secondary | ICD-10-CM

## 2021-06-03 DIAGNOSIS — I130 Hypertensive heart and chronic kidney disease with heart failure and stage 1 through stage 4 chronic kidney disease, or unspecified chronic kidney disease: Secondary | ICD-10-CM | POA: Diagnosis not present

## 2021-06-03 DIAGNOSIS — E785 Hyperlipidemia, unspecified: Secondary | ICD-10-CM | POA: Diagnosis not present

## 2021-06-03 DIAGNOSIS — I509 Heart failure, unspecified: Secondary | ICD-10-CM | POA: Diagnosis not present

## 2021-06-03 DIAGNOSIS — N1832 Chronic kidney disease, stage 3b: Secondary | ICD-10-CM | POA: Diagnosis not present

## 2021-06-23 ENCOUNTER — Other Ambulatory Visit: Payer: Self-pay | Admitting: Adult Health

## 2021-06-23 DIAGNOSIS — I2510 Atherosclerotic heart disease of native coronary artery without angina pectoris: Secondary | ICD-10-CM

## 2021-07-03 DIAGNOSIS — N1832 Chronic kidney disease, stage 3b: Secondary | ICD-10-CM | POA: Diagnosis not present

## 2021-07-03 DIAGNOSIS — I509 Heart failure, unspecified: Secondary | ICD-10-CM | POA: Diagnosis not present

## 2021-07-03 DIAGNOSIS — I130 Hypertensive heart and chronic kidney disease with heart failure and stage 1 through stage 4 chronic kidney disease, or unspecified chronic kidney disease: Secondary | ICD-10-CM | POA: Diagnosis not present

## 2021-07-03 DIAGNOSIS — E785 Hyperlipidemia, unspecified: Secondary | ICD-10-CM | POA: Diagnosis not present

## 2021-07-15 ENCOUNTER — Other Ambulatory Visit: Payer: Self-pay | Admitting: Adult Health

## 2021-07-15 DIAGNOSIS — I4891 Unspecified atrial fibrillation: Secondary | ICD-10-CM

## 2021-07-16 DIAGNOSIS — M810 Age-related osteoporosis without current pathological fracture: Secondary | ICD-10-CM | POA: Diagnosis not present

## 2021-07-16 DIAGNOSIS — R739 Hyperglycemia, unspecified: Secondary | ICD-10-CM | POA: Diagnosis not present

## 2021-07-16 DIAGNOSIS — I10 Essential (primary) hypertension: Secondary | ICD-10-CM | POA: Diagnosis not present

## 2021-07-23 DIAGNOSIS — R2681 Unsteadiness on feet: Secondary | ICD-10-CM | POA: Diagnosis not present

## 2021-07-23 DIAGNOSIS — N1832 Chronic kidney disease, stage 3b: Secondary | ICD-10-CM | POA: Diagnosis not present

## 2021-07-23 DIAGNOSIS — Z23 Encounter for immunization: Secondary | ICD-10-CM | POA: Diagnosis not present

## 2021-07-23 DIAGNOSIS — R7989 Other specified abnormal findings of blood chemistry: Secondary | ICD-10-CM | POA: Diagnosis not present

## 2021-07-23 DIAGNOSIS — Z7901 Long term (current) use of anticoagulants: Secondary | ICD-10-CM | POA: Diagnosis not present

## 2021-07-23 DIAGNOSIS — M810 Age-related osteoporosis without current pathological fracture: Secondary | ICD-10-CM | POA: Diagnosis not present

## 2021-07-23 DIAGNOSIS — I4891 Unspecified atrial fibrillation: Secondary | ICD-10-CM | POA: Diagnosis not present

## 2021-07-23 DIAGNOSIS — I10 Essential (primary) hypertension: Secondary | ICD-10-CM | POA: Diagnosis not present

## 2021-07-23 DIAGNOSIS — R7303 Prediabetes: Secondary | ICD-10-CM | POA: Diagnosis not present

## 2021-07-23 DIAGNOSIS — I509 Heart failure, unspecified: Secondary | ICD-10-CM | POA: Diagnosis not present

## 2021-07-23 DIAGNOSIS — E785 Hyperlipidemia, unspecified: Secondary | ICD-10-CM | POA: Diagnosis not present

## 2021-09-17 ENCOUNTER — Other Ambulatory Visit: Payer: Self-pay | Admitting: Adult Health

## 2021-09-17 DIAGNOSIS — I5032 Chronic diastolic (congestive) heart failure: Secondary | ICD-10-CM

## 2021-10-16 ENCOUNTER — Telehealth: Payer: Self-pay

## 2021-10-16 NOTE — Telephone Encounter (Signed)
Attempted to contact patient's daughter [name] to schedule a Palliative Care consult appointment. No answer left a message to return call.

## 2021-10-27 ENCOUNTER — Telehealth: Payer: Self-pay

## 2021-10-27 NOTE — Telephone Encounter (Signed)
Attempted to contact patient's daughter [name] to schedule a Palliative Care consult appointment. No answer left a message to return call. Spoke with patient she states her daughter handles all her appointments.

## 2022-03-17 DIAGNOSIS — H401113 Primary open-angle glaucoma, right eye, severe stage: Secondary | ICD-10-CM | POA: Diagnosis not present

## 2022-03-17 DIAGNOSIS — H401122 Primary open-angle glaucoma, left eye, moderate stage: Secondary | ICD-10-CM | POA: Diagnosis not present

## 2022-05-24 DIAGNOSIS — Z23 Encounter for immunization: Secondary | ICD-10-CM | POA: Diagnosis not present

## 2022-07-28 DIAGNOSIS — I4891 Unspecified atrial fibrillation: Secondary | ICD-10-CM | POA: Diagnosis not present

## 2022-07-28 DIAGNOSIS — N1832 Chronic kidney disease, stage 3b: Secondary | ICD-10-CM | POA: Diagnosis not present

## 2022-07-28 DIAGNOSIS — Z7901 Long term (current) use of anticoagulants: Secondary | ICD-10-CM | POA: Diagnosis not present

## 2022-07-28 DIAGNOSIS — M810 Age-related osteoporosis without current pathological fracture: Secondary | ICD-10-CM | POA: Diagnosis not present

## 2022-07-28 DIAGNOSIS — I129 Hypertensive chronic kidney disease with stage 1 through stage 4 chronic kidney disease, or unspecified chronic kidney disease: Secondary | ICD-10-CM | POA: Diagnosis not present

## 2022-07-28 DIAGNOSIS — R7303 Prediabetes: Secondary | ICD-10-CM | POA: Diagnosis not present

## 2022-08-04 DIAGNOSIS — I4891 Unspecified atrial fibrillation: Secondary | ICD-10-CM | POA: Diagnosis not present

## 2022-08-04 DIAGNOSIS — R634 Abnormal weight loss: Secondary | ICD-10-CM | POA: Diagnosis not present

## 2022-08-04 DIAGNOSIS — Z7901 Long term (current) use of anticoagulants: Secondary | ICD-10-CM | POA: Diagnosis not present

## 2022-08-04 DIAGNOSIS — Z23 Encounter for immunization: Secondary | ICD-10-CM | POA: Diagnosis not present

## 2022-08-04 DIAGNOSIS — R7303 Prediabetes: Secondary | ICD-10-CM | POA: Diagnosis not present

## 2022-08-04 DIAGNOSIS — N1832 Chronic kidney disease, stage 3b: Secondary | ICD-10-CM | POA: Diagnosis not present

## 2022-08-04 DIAGNOSIS — I129 Hypertensive chronic kidney disease with stage 1 through stage 4 chronic kidney disease, or unspecified chronic kidney disease: Secondary | ICD-10-CM | POA: Diagnosis not present

## 2022-08-04 DIAGNOSIS — M810 Age-related osteoporosis without current pathological fracture: Secondary | ICD-10-CM | POA: Diagnosis not present

## 2022-08-04 DIAGNOSIS — D649 Anemia, unspecified: Secondary | ICD-10-CM | POA: Diagnosis not present

## 2022-10-11 ENCOUNTER — Emergency Department (HOSPITAL_COMMUNITY): Payer: Medicare Other

## 2022-10-11 ENCOUNTER — Encounter (HOSPITAL_COMMUNITY): Payer: Self-pay

## 2022-10-11 ENCOUNTER — Other Ambulatory Visit: Payer: Self-pay

## 2022-10-11 ENCOUNTER — Inpatient Hospital Stay (HOSPITAL_COMMUNITY)
Admission: EM | Admit: 2022-10-11 | Discharge: 2022-10-14 | DRG: 291 | Disposition: A | Discharge status: Hospice | Payer: Medicare Other | Attending: Internal Medicine | Admitting: Internal Medicine

## 2022-10-11 DIAGNOSIS — J811 Chronic pulmonary edema: Secondary | ICD-10-CM | POA: Diagnosis not present

## 2022-10-11 DIAGNOSIS — E440 Moderate protein-calorie malnutrition: Secondary | ICD-10-CM | POA: Diagnosis not present

## 2022-10-11 DIAGNOSIS — Z6821 Body mass index (BMI) 21.0-21.9, adult: Secondary | ICD-10-CM

## 2022-10-11 DIAGNOSIS — I509 Heart failure, unspecified: Principal | ICD-10-CM

## 2022-10-11 DIAGNOSIS — I130 Hypertensive heart and chronic kidney disease with heart failure and stage 1 through stage 4 chronic kidney disease, or unspecified chronic kidney disease: Secondary | ICD-10-CM | POA: Diagnosis not present

## 2022-10-11 DIAGNOSIS — H409 Unspecified glaucoma: Secondary | ICD-10-CM | POA: Diagnosis present

## 2022-10-11 DIAGNOSIS — R54 Age-related physical debility: Secondary | ICD-10-CM | POA: Diagnosis not present

## 2022-10-11 DIAGNOSIS — I110 Hypertensive heart disease with heart failure: Secondary | ICD-10-CM | POA: Diagnosis not present

## 2022-10-11 DIAGNOSIS — E785 Hyperlipidemia, unspecified: Secondary | ICD-10-CM | POA: Diagnosis present

## 2022-10-11 DIAGNOSIS — Z515 Encounter for palliative care: Secondary | ICD-10-CM

## 2022-10-11 DIAGNOSIS — R0902 Hypoxemia: Secondary | ICD-10-CM | POA: Diagnosis not present

## 2022-10-11 DIAGNOSIS — I272 Pulmonary hypertension, unspecified: Secondary | ICD-10-CM | POA: Diagnosis not present

## 2022-10-11 DIAGNOSIS — Z7901 Long term (current) use of anticoagulants: Secondary | ICD-10-CM | POA: Diagnosis not present

## 2022-10-11 DIAGNOSIS — N1832 Chronic kidney disease, stage 3b: Secondary | ICD-10-CM | POA: Diagnosis present

## 2022-10-11 DIAGNOSIS — H401132 Primary open-angle glaucoma, bilateral, moderate stage: Secondary | ICD-10-CM | POA: Diagnosis not present

## 2022-10-11 DIAGNOSIS — T17908A Unspecified foreign body in respiratory tract, part unspecified causing other injury, initial encounter: Secondary | ICD-10-CM | POA: Diagnosis present

## 2022-10-11 DIAGNOSIS — J449 Chronic obstructive pulmonary disease, unspecified: Secondary | ICD-10-CM | POA: Diagnosis present

## 2022-10-11 DIAGNOSIS — N390 Urinary tract infection, site not specified: Secondary | ICD-10-CM | POA: Diagnosis present

## 2022-10-11 DIAGNOSIS — R404 Transient alteration of awareness: Secondary | ICD-10-CM | POA: Diagnosis not present

## 2022-10-11 DIAGNOSIS — I482 Chronic atrial fibrillation, unspecified: Secondary | ICD-10-CM | POA: Diagnosis present

## 2022-10-11 DIAGNOSIS — Z823 Family history of stroke: Secondary | ICD-10-CM | POA: Diagnosis not present

## 2022-10-11 DIAGNOSIS — J9621 Acute and chronic respiratory failure with hypoxia: Secondary | ICD-10-CM | POA: Diagnosis not present

## 2022-10-11 DIAGNOSIS — Z8541 Personal history of malignant neoplasm of cervix uteri: Secondary | ICD-10-CM

## 2022-10-11 DIAGNOSIS — R1310 Dysphagia, unspecified: Secondary | ICD-10-CM

## 2022-10-11 DIAGNOSIS — R627 Adult failure to thrive: Secondary | ICD-10-CM | POA: Diagnosis not present

## 2022-10-11 DIAGNOSIS — J9601 Acute respiratory failure with hypoxia: Secondary | ICD-10-CM

## 2022-10-11 DIAGNOSIS — Z8673 Personal history of transient ischemic attack (TIA), and cerebral infarction without residual deficits: Secondary | ICD-10-CM

## 2022-10-11 DIAGNOSIS — D649 Anemia, unspecified: Secondary | ICD-10-CM | POA: Diagnosis not present

## 2022-10-11 DIAGNOSIS — Z66 Do not resuscitate: Secondary | ICD-10-CM | POA: Diagnosis not present

## 2022-10-11 DIAGNOSIS — I4821 Permanent atrial fibrillation: Secondary | ICD-10-CM | POA: Diagnosis not present

## 2022-10-11 DIAGNOSIS — Z7189 Other specified counseling: Secondary | ICD-10-CM | POA: Diagnosis not present

## 2022-10-11 DIAGNOSIS — I959 Hypotension, unspecified: Secondary | ICD-10-CM | POA: Diagnosis present

## 2022-10-11 DIAGNOSIS — Z743 Need for continuous supervision: Secondary | ICD-10-CM | POA: Diagnosis not present

## 2022-10-11 DIAGNOSIS — Z7401 Bed confinement status: Secondary | ICD-10-CM | POA: Diagnosis not present

## 2022-10-11 DIAGNOSIS — Z90710 Acquired absence of both cervix and uterus: Secondary | ICD-10-CM

## 2022-10-11 DIAGNOSIS — I5033 Acute on chronic diastolic (congestive) heart failure: Secondary | ICD-10-CM | POA: Diagnosis not present

## 2022-10-11 DIAGNOSIS — Z79899 Other long term (current) drug therapy: Secondary | ICD-10-CM | POA: Diagnosis not present

## 2022-10-11 DIAGNOSIS — Z86718 Personal history of other venous thrombosis and embolism: Secondary | ICD-10-CM

## 2022-10-11 DIAGNOSIS — N179 Acute kidney failure, unspecified: Secondary | ICD-10-CM | POA: Diagnosis not present

## 2022-10-11 DIAGNOSIS — R29898 Other symptoms and signs involving the musculoskeletal system: Secondary | ICD-10-CM | POA: Diagnosis not present

## 2022-10-11 DIAGNOSIS — R5383 Other fatigue: Secondary | ICD-10-CM | POA: Diagnosis not present

## 2022-10-11 DIAGNOSIS — Z1152 Encounter for screening for COVID-19: Secondary | ICD-10-CM

## 2022-10-11 DIAGNOSIS — J9811 Atelectasis: Secondary | ICD-10-CM | POA: Diagnosis not present

## 2022-10-11 DIAGNOSIS — J90 Pleural effusion, not elsewhere classified: Secondary | ICD-10-CM | POA: Diagnosis not present

## 2022-10-11 DIAGNOSIS — N189 Chronic kidney disease, unspecified: Secondary | ICD-10-CM | POA: Diagnosis not present

## 2022-10-11 DIAGNOSIS — I10 Essential (primary) hypertension: Secondary | ICD-10-CM | POA: Diagnosis not present

## 2022-10-11 DIAGNOSIS — N183 Chronic kidney disease, stage 3 unspecified: Secondary | ICD-10-CM | POA: Diagnosis present

## 2022-10-11 DIAGNOSIS — N3000 Acute cystitis without hematuria: Secondary | ICD-10-CM | POA: Diagnosis not present

## 2022-10-11 LAB — CBC WITH DIFFERENTIAL/PLATELET
Abs Immature Granulocytes: 0.03 10*3/uL (ref 0.00–0.07)
Basophils Absolute: 0 10*3/uL (ref 0.0–0.1)
Basophils Relative: 0 %
Eosinophils Absolute: 0 10*3/uL (ref 0.0–0.5)
Eosinophils Relative: 0 %
HCT: 37.6 % (ref 36.0–46.0)
Hemoglobin: 11.2 g/dL — ABNORMAL LOW (ref 12.0–15.0)
Immature Granulocytes: 0 %
Lymphocytes Relative: 12 %
Lymphs Abs: 0.9 10*3/uL (ref 0.7–4.0)
MCH: 25.1 pg — ABNORMAL LOW (ref 26.0–34.0)
MCHC: 29.8 g/dL — ABNORMAL LOW (ref 30.0–36.0)
MCV: 84.3 fL (ref 80.0–100.0)
Monocytes Absolute: 0.6 10*3/uL (ref 0.1–1.0)
Monocytes Relative: 7 %
Neutro Abs: 6.3 10*3/uL (ref 1.7–7.7)
Neutrophils Relative %: 81 %
Platelets: 300 10*3/uL (ref 150–400)
RBC: 4.46 MIL/uL (ref 3.87–5.11)
RDW: 21 % — ABNORMAL HIGH (ref 11.5–15.5)
WBC: 7.8 10*3/uL (ref 4.0–10.5)
nRBC: 0 % (ref 0.0–0.2)

## 2022-10-11 LAB — COMPREHENSIVE METABOLIC PANEL
ALT: 12 U/L (ref 0–44)
AST: 20 U/L (ref 15–41)
Albumin: 2.5 g/dL — ABNORMAL LOW (ref 3.5–5.0)
Alkaline Phosphatase: 77 U/L (ref 38–126)
Anion gap: 10 (ref 5–15)
BUN: 23 mg/dL (ref 8–23)
CO2: 26 mmol/L (ref 22–32)
Calcium: 7.9 mg/dL — ABNORMAL LOW (ref 8.9–10.3)
Chloride: 105 mmol/L (ref 98–111)
Creatinine, Ser: 1.52 mg/dL — ABNORMAL HIGH (ref 0.44–1.00)
GFR, Estimated: 32 mL/min — ABNORMAL LOW (ref >60)
Glucose, Bld: 96 mg/dL (ref 70–99)
Potassium: 4.3 mmol/L (ref 3.5–5.1)
Sodium: 141 mmol/L (ref 135–145)
Total Bilirubin: 0.7 mg/dL (ref 0.3–1.2)
Total Protein: 7 g/dL (ref 6.5–8.1)

## 2022-10-11 LAB — RESP PANEL BY RT-PCR (RSV, FLU A&B, COVID)  RVPGX2
Influenza A by PCR: NEGATIVE
Influenza B by PCR: NEGATIVE
Resp Syncytial Virus by PCR: NEGATIVE
SARS Coronavirus 2 by RT PCR: NEGATIVE

## 2022-10-11 LAB — URINALYSIS, ROUTINE W REFLEX MICROSCOPIC
Bilirubin Urine: NEGATIVE
Glucose, UA: NEGATIVE mg/dL
Hgb urine dipstick: NEGATIVE
Ketones, ur: NEGATIVE mg/dL
Nitrite: NEGATIVE
Protein, ur: NEGATIVE mg/dL
Specific Gravity, Urine: 1.01 (ref 1.005–1.030)
pH: 5 (ref 5.0–8.0)

## 2022-10-11 LAB — I-STAT CHEM 8, ED
BUN: 25 mg/dL — ABNORMAL HIGH (ref 8–23)
Calcium, Ion: 1.06 mmol/L — ABNORMAL LOW (ref 1.15–1.40)
Chloride: 104 mmol/L (ref 98–111)
Creatinine, Ser: 1.7 mg/dL — ABNORMAL HIGH (ref 0.44–1.00)
Glucose, Bld: 104 mg/dL — ABNORMAL HIGH (ref 70–99)
HCT: 38 % (ref 36.0–46.0)
Hemoglobin: 12.9 g/dL (ref 12.0–15.0)
Potassium: 4.5 mmol/L (ref 3.5–5.1)
Sodium: 142 mmol/L (ref 135–145)
TCO2: 27 mmol/L (ref 22–32)

## 2022-10-11 LAB — MAGNESIUM: Magnesium: 2.5 mg/dL — ABNORMAL HIGH (ref 1.7–2.4)

## 2022-10-11 LAB — LACTIC ACID, PLASMA
Lactic Acid, Venous: 1.4 mmol/L (ref 0.5–1.9)
Lactic Acid, Venous: 2 mmol/L (ref 0.5–1.9)

## 2022-10-11 LAB — BRAIN NATRIURETIC PEPTIDE: B Natriuretic Peptide: 612 pg/mL — ABNORMAL HIGH (ref 0.0–100.0)

## 2022-10-11 MED ORDER — METOPROLOL SUCCINATE ER 50 MG PO TB24
75.0000 mg | ORAL_TABLET | Freq: Every day | ORAL | Status: DC
  Administered 2022-10-12 – 2022-10-14 (×3): 75 mg via ORAL
  Filled 2022-10-11 (×3): qty 1

## 2022-10-11 MED ORDER — ACETAMINOPHEN 650 MG RE SUPP: 650.0000 mg | Freq: Four times a day (QID) | RECTAL | Status: DC | PRN

## 2022-10-11 MED ORDER — POLYETHYLENE GLYCOL 3350 17 G PO PACK: 17.0000 g | PACK | Freq: Every day | ORAL | Status: DC | PRN

## 2022-10-11 MED ORDER — SODIUM CHLORIDE 0.9% FLUSH
3.0000 mL | Freq: Two times a day (BID) | INTRAVENOUS | Status: DC
  Administered 2022-10-11 – 2022-10-13 (×4): 3 mL via INTRAVENOUS

## 2022-10-11 MED ORDER — BISACODYL 5 MG PO TBEC: 5.0000 mg | DELAYED_RELEASE_TABLET | Freq: Every day | ORAL | Status: DC | PRN

## 2022-10-11 MED ORDER — DOCUSATE SODIUM 100 MG PO CAPS
100.0000 mg | ORAL_CAPSULE | Freq: Two times a day (BID) | ORAL | Status: DC
  Administered 2022-10-12 – 2022-10-13 (×3): 100 mg via ORAL
  Filled 2022-10-11 (×6): qty 1

## 2022-10-11 MED ORDER — MORPHINE SULFATE (PF) 2 MG/ML IV SOLN: 2.0000 mg | INTRAVENOUS | Status: DC | PRN

## 2022-10-11 MED ORDER — APIXABAN 2.5 MG PO TABS
2.5000 mg | ORAL_TABLET | Freq: Two times a day (BID) | ORAL | Status: DC
  Administered 2022-10-12 – 2022-10-14 (×4): 2.5 mg via ORAL
  Filled 2022-10-11 (×6): qty 1

## 2022-10-11 MED ORDER — FUROSEMIDE 10 MG/ML IJ SOLN
40.0000 mg | Freq: Two times a day (BID) | INTRAMUSCULAR | Status: DC
  Administered 2022-10-11 – 2022-10-12 (×3): 40 mg via INTRAVENOUS
  Filled 2022-10-11 (×4): qty 4

## 2022-10-11 MED ORDER — LACTATED RINGERS IV BOLUS
250.0000 mL | Freq: Once | INTRAVENOUS | Status: AC
  Administered 2022-10-11: 250 mL via INTRAVENOUS

## 2022-10-11 MED ORDER — ATORVASTATIN CALCIUM 10 MG PO TABS
20.0000 mg | ORAL_TABLET | Freq: Every day | ORAL | Status: DC
  Administered 2022-10-12 – 2022-10-14 (×3): 20 mg via ORAL
  Filled 2022-10-11 (×3): qty 2

## 2022-10-11 MED ORDER — HYDRALAZINE HCL 20 MG/ML IJ SOLN: 5.0000 mg | INTRAMUSCULAR | Status: DC | PRN

## 2022-10-11 MED ORDER — BRIMONIDINE TARTRATE 0.2 % OP SOLN
1.0000 [drp] | Freq: Two times a day (BID) | OPHTHALMIC | Status: DC
  Administered 2022-10-11 – 2022-10-14 (×6): 1 [drp] via OPHTHALMIC
  Filled 2022-10-11: qty 5

## 2022-10-11 MED ORDER — ACETAMINOPHEN 325 MG PO TABS: 650.0000 mg | ORAL_TABLET | Freq: Four times a day (QID) | ORAL | Status: DC | PRN

## 2022-10-11 MED ORDER — LATANOPROST 0.005 % OP SOLN
1.0000 [drp] | Freq: Every evening | OPHTHALMIC | Status: DC
  Administered 2022-10-11 – 2022-10-13 (×3): 1 [drp] via OPHTHALMIC
  Filled 2022-10-11: qty 2.5

## 2022-10-11 MED ORDER — FUROSEMIDE 10 MG/ML IJ SOLN
40.0000 mg | Freq: Once | INTRAMUSCULAR | Status: AC
  Administered 2022-10-11: 40 mg via INTRAVENOUS
  Filled 2022-10-11: qty 4

## 2022-10-11 MED ORDER — DORZOLAMIDE HCL-TIMOLOL MAL 2-0.5 % OP SOLN
1.0000 [drp] | Freq: Two times a day (BID) | OPHTHALMIC | Status: DC
  Administered 2022-10-11 – 2022-10-14 (×6): 1 [drp] via OPHTHALMIC
  Filled 2022-10-11: qty 10

## 2022-10-11 MED ORDER — TRAZODONE HCL 50 MG PO TABS: 25.0000 mg | ORAL_TABLET | Freq: Every evening | ORAL | Status: DC | PRN

## 2022-10-11 MED ORDER — ONDANSETRON HCL 4 MG PO TABS: 4.0000 mg | ORAL_TABLET | Freq: Four times a day (QID) | ORAL | Status: DC | PRN

## 2022-10-11 MED ORDER — ONDANSETRON HCL 4 MG/2ML IJ SOLN: 4.0000 mg | Freq: Four times a day (QID) | INTRAMUSCULAR | Status: DC | PRN

## 2022-10-11 NOTE — ED Triage Notes (Signed)
Pt BIB [facility] from home d/t SOB. EMS reports spO2 was 88% on RA so they put her on 3LO2 via n/c while en route to ED. SBP was 106.

## 2022-10-11 NOTE — ED Provider Notes (Signed)
[facility] EMERGENCY DEPARTMENT Provider Note   CSN: [id number] Arrival date & time: 10/11/22  1102     History  Chief Complaint  Patient presents with   SOB    PATTRICIA WEIHER is a 89 y.o. female.  HPI Patient presents for fatigue and generalized weakness.  Medical history includes atrial fibrillation, HTN, COPD, HLD, malnutrition, CKD, DVT, CHF, tricuspid regurgitation.  Patient lives at home with daughter.  Daughter noticed poor appetite over the last several days.  Yesterday, she had decreased activity and fatigue.  At baseline, patient stays in a recliner.  She requires assistance with transfers to potty chair and wheelchair.  EMS was called to her home this morning.  EMS noted increased work of breathing.  They noted SpO2 of 88% on room air.  She was placed on 3 L of supplemental oxygen.  She does not wear supplemental oxygen at baseline.  She does take torsemide and has been taking all of her home medications.  Yesterday, patient's daughter noted a rapid heart rate and give her extra metoprolol.  When she subsequently checked her pulse oximetry, she had SpO2's in the 60s.  Patient, herself, denies any areas of discomfort at this time.  She does endorse some mild shortness of breath.    Home Medications Prior to Admission medications   Medication Sig Start Date End Date Taking? Authorizing Provider  acetaminophen (TYLENOL) 500 MG tablet Take 2 tablets (1,000 mg total) by mouth 3 (three) times daily. 03/03/21   Ghimire, Henreitta Leber, MD  apixaban (ELIQUIS) 2.5 MG TABS tablet Take 1 tablet (2.5 mg total) by mouth 2 (two) times daily. 03/20/21   Medina-Vargas, Monina C, NP  atorvastatin (LIPITOR) 20 MG tablet Take 1 tablet (20 mg total) by mouth daily. 03/20/21   Medina-Vargas, Monina C, NP  bisacodyl (DULCOLAX) 10 MG suppository Place 10 mg rectally as needed for moderate constipation.    [provider]  brimonidine (ALPHAGAN) 0.2 % ophthalmic solution Place 1  drop into the right eye 2 (two) times daily. 03/20/21   Medina-Vargas, Monina C, NP  denosumab (PROLIA) 60 MG/ML SOSY injection Inject 60 mg into the skin every 6 (six) months.    [provider]  diltiazem (CARDIZEM CD) 240 MG 24 hr capsule Take 1 capsule (240 mg total) by mouth daily. 03/20/21 04/19/21  Medina-Vargas, Monina C, NP  dorzolamide-timolol (COSOPT) 22.3-6.8 MG/ML ophthalmic solution Place 1 drop into both eyes 2 (two) times daily. 03/20/21   Medina-Vargas, Monina C, NP  latanoprost (XALATAN) 0.005 % ophthalmic solution Place 1 drop into both eyes every evening. 03/20/21   Medina-Vargas, Monina C, NP  Magnesium Hydroxide (MILK OF MAGNESIA PO) Take 30 mLs by mouth as needed.    [provider]  metoprolol succinate (TOPROL-XL) 25 MG 24 hr tablet Take 2 tablets (50 mg total) by mouth daily. 03/20/21 04/19/21  Medina-Vargas, Monina C, NP  Multiple Vitamin (MULTIVITAMIN WITH MINERALS) TABS tablet Take 1 tablet by mouth daily.    [provider]  polyethylene glycol (MIRALAX / GLYCOLAX) 17 g packet Take 17 g by mouth daily. 03/03/21   Ghimire, Henreitta Leber, MD  potassium chloride (KLOR-CON) 10 MEQ tablet Take 1 tablet (10 mEq total) by mouth daily. 03/20/21 04/19/21  Medina-Vargas, Monina C, NP  Sodium Phosphates (RA SALINE ENEMA RE) Place rectally as needed.    [provider]  torsemide (DEMADEX) 20 MG tablet Take 1 tablet (20 mg total) by mouth daily. 03/20/21 04/19/21  Medina-Vargas, Jaymes Graff  C, NP      Allergies    Penicillins, Zocor [simvastatin], and Nsaids    Review of Systems   Review of Systems  Constitutional:  Positive for activity change, appetite change and fatigue.  Respiratory:  Positive for shortness of breath.   Neurological:  Positive for weakness (Generalized).  All other systems reviewed and are negative.   Physical Exam Updated Vital Signs BP 114/81   Pulse (!) 37   Temp (!) 97.5 F (36.4 C) (Oral)   Resp 16   SpO2 95%  Physical  Exam Vitals and nursing note reviewed.  Constitutional:      General: She is not in acute distress.    Appearance: Normal appearance. She is well-developed. She is not toxic-appearing or diaphoretic.  HENT:     Head: Normocephalic and atraumatic.     Right Ear: External ear normal.     Left Ear: External ear normal.     Nose: Nose normal.     Mouth/Throat:     Mouth: Mucous membranes are moist.  Eyes:     Extraocular Movements: Extraocular movements intact.     Conjunctiva/sclera: Conjunctivae normal.  Neck:     Vascular: JVD present.  Cardiovascular:     Rate and Rhythm: Normal rate. Rhythm irregular.     Heart sounds: No murmur heard. Pulmonary:     Effort: Pulmonary effort is normal. No tachypnea, accessory muscle usage or respiratory distress.     Breath sounds: Examination of the right-lower field reveals decreased breath sounds. Examination of the left-lower field reveals decreased breath sounds. Decreased breath sounds present. No wheezing, rhonchi or rales.  Abdominal:     General: There is no distension.     Palpations: Abdomen is soft.     Tenderness: There is no abdominal tenderness.  Musculoskeletal:        General: No swelling.     Cervical back: Normal range of motion and neck supple.     Right lower leg: No edema.     Left lower leg: No edema.  Skin:    General: Skin is warm and dry.     Capillary Refill: Capillary refill takes less than 2 seconds.     Coloration: Skin is not jaundiced or pale.  Neurological:     General: No focal deficit present.     Mental Status: She is alert and oriented to person, place, and time. Mental status is at baseline.  Psychiatric:        Mood and Affect: Mood normal.        Behavior: Behavior normal.     ED Results / Procedures / Treatments   Labs (all labs ordered are listed, but only abnormal results are displayed) Labs Reviewed  COMPREHENSIVE METABOLIC PANEL - Abnormal; Notable for the following components:       Result Value   Creatinine, Ser 1.52 (*)    Calcium 7.9 (*)    Albumin 2.5 (*)    GFR, Estimated 32 (*)    All other components within normal limits  CBC WITH DIFFERENTIAL/PLATELET - Abnormal; Notable for the following components:   Hemoglobin 11.2 (*)    MCH 25.1 (*)    MCHC 29.8 (*)    RDW 21.0 (*)    All other components within normal limits  BRAIN NATRIURETIC PEPTIDE - Abnormal; Notable for the following components:   B Natriuretic Peptide 612.0 (*)    All other components within normal limits  URINALYSIS, ROUTINE W REFLEX MICROSCOPIC - Abnormal;  Notable for the following components:   APPearance HAZY (*)    Leukocytes,Ua SMALL (*)    Bacteria, UA FEW (*)    All other components within normal limits  MAGNESIUM - Abnormal; Notable for the following components:   Magnesium 2.5 (*)    All other components within normal limits  I-STAT CHEM 8, ED - Abnormal; Notable for the following components:   BUN 25 (*)    Creatinine, Ser 1.70 (*)    Glucose, Bld 104 (*)    Calcium, Ion 1.06 (*)    All other components within normal limits  RESP PANEL BY RT-PCR (RSV, FLU A&B, COVID)  [id number]  CULTURE, BLOOD (ROUTINE X 2)  CULTURE, BLOOD (ROUTINE X 2)  URINE CULTURE  LACTIC ACID, PLASMA  LACTIC ACID, PLASMA    EKG EKG Interpretation  Date/Time:  Monday October 11 2022 11:16:51 EST Ventricular Rate:  98 PR Interval:    QRS Duration: 88 QT Interval:  404 QTC Calculation: 469 R Axis:   -29 Text Interpretation: Atrial fibrillation Ventricular premature complex Borderline left axis deviation Low voltage, extremity and precordial leads Borderline repolarization abnormality Confirmed by Godfrey Pick [phone]) on 10/11/2022 12:47:09 PM  Radiology DG Chest Port 1 View  Result Date: 10/11/2022 CLINICAL DATA:  Fatigue. EXAM: PORTABLE CHEST 1 VIEW COMPARISON:  Chest x-ray dated Feb 23, 2021. FINDINGS: Chronic cardiomegaly. Increased pulmonary vascular congestion and mild diffuse interstitial  thickening. New small bilateral pleural effusions and bibasilar atelectasis. No pneumothorax. No acute osseous abnormality. IMPRESSION: 1. Mild congestive heart failure. Electronically Signed   By: Titus Dubin M.D.   On: 10/11/2022 12:28    Procedures Procedures    Medications Ordered in ED Medications  lactated ringers bolus 250 mL (250 mLs Intravenous New Bag/Given 10/11/22 1537)  furosemide (LASIX) injection 40 mg (40 mg Intravenous Given 10/11/22 1607)    ED Course/ Medical Decision Making/ A&P                           Medical Decision Making Amount and/or Complexity of Data Reviewed Labs: ordered. Radiology: ordered.  Risk Prescription drug management.   This patient presents to the ED for concern of fatigue, generalized weakness, shortness of breath, this involves an extensive number of treatment options, and is a complaint that carries with it a high risk of complications and morbidity.  The differential diagnosis includes CHF, infection, dehydration, metabolic derangements, deconditioning   Co morbidities that complicate the patient evaluation  atrial fibrillation, HTN, COPD, HLD, malnutrition, CKD, DVT, CHF, tricuspid regurgitation   Additional history obtained:  Additional history obtained from EMS, patient's daughter External records from outside source obtained and reviewed including EMR   Lab Tests:  I Ordered, and personally interpreted labs.  The pertinent results include: Mild hypermagnesemia and hypocalcemia with otherwise normal electrolytes, creatinine is increased from baseline.  Anemia has improved from a year ago.  No leukocytosis is present.  BNP is elevated.   Imaging Studies ordered:  I ordered imaging studies including chest x-ray I independently visualized and interpreted imaging which showed baseline cardiomegaly, increased pulmonary vascular congestion interstitial thickening with small bilateral pleural effusions I agree with the  radiologist interpretation   Cardiac Monitoring: / EKG:  The patient was maintained on a cardiac monitor.  I personally viewed and interpreted the cardiac monitored which showed an underlying rhythm of: Atrial fibrillation   Problem List / ED Course / Critical interventions / Medication management  Patient presents  to the ED with initial provide EMS that they were called out for shortness of breath.  They also state that she had increased work of breathing and hypoxia on scene.  She was placed on supplemental oxygen.  On arrival in the ED, patient was weaned to room air and was able to maintain normal SpO2.  Patient's daughter arrived shortly thereafter and provided further history.  Reports that her main concern is been decreased appetite and decreased activity over the past several days.  Daughter is worried about dehydration.  Small bolus of IV fluid was ordered.  Daughter states that patient is taking torsemide for diuresis.  She has been able to take her home medications.  Patient was kept on bedside cardiac monitor.  EKG shows atrial fibrillation.  Laboratory workup was initiated.  On lab work, patient has elevated BNP.  Chest x-ray shows vascular congestion and interstitial thickening with small bilateral pleural effusions.  Per chart review, the last time BNP was elevated to this degree she was admitted for CHF exacerbation.  Last echocardiogram was May 2022.  At that time, she had preserved LVEF with grade 3 diastolic dysfunction and severe biatrial dilatation.  Today, given elevated BNP and x-ray findings, IV Lasix was ordered.  Patient also was started back on supplemental oxygen due to episodes of hypoxia.  On review of cardiac monitor, she remained in atrial fibrillation without any pauses greater than 2 seconds.  Patient to be admitted for CHF exacerbation. I ordered medication including Lasix for diuresis Reevaluation of the patient after these medicines showed that the patient stayed the  same I have reviewed the patients home medicines and have made adjustments as needed   Social Determinants of Health:  Lives at home with daughter         Final Clinical Impression(s) / ED Diagnoses Final diagnoses:  Acute on chronic congestive heart failure, unspecified heart failure type [facility])    Rx / DC Orders ED Discharge Orders     None         Godfrey Pick, MD 10/11/22 1640

## 2022-10-11 NOTE — ED Notes (Signed)
ED TO INPATIENT HANDOFF REPORT  ED Nurse Name and Phone #: Daphine Deutscher, [phone]  S Name/Age/Gender Kelli Peterson 89 y.o. female Room/Bed: 046C/046C  Code Status   Code Status: DNR  Home/SNF/Other Home Patient oriented to:   Is this baseline? Yes   Triage Complete: Triage complete  Chief Complaint Acute on chronic diastolic (congestive) heart failure (HCC) [I50.33]  Triage Note Pt BIB [facility] from home d/t SOB. EMS reports spO2 was 88% on RA so they put her on 3LO2 via n/c while en route to ED. SBP was 106.   Allergies Allergies  Allergen Reactions   Penicillins Swelling and Other (See Comments)    Swelling of ankles, feet   Zocor [Simvastatin] Other (See Comments)    Weakness  Dizziness    Ferosul [Ferrous Sulfate] Other (See Comments)    Severe constipation if given daily. Tolerates 2-3 times a week.   Nsaids Other (See Comments)    Tylenol is all that is permitted    Level of Care/Admitting Diagnosis ED Disposition     ED Disposition  Admit   Condition  --   [place]: [facility] [100100]  Level of Care: Telemetry Cardiac [103]  May admit patient to [facility] or [facility] if equivalent level of care is available:: No  Covid Evaluation: Asymptomatic - no recent exposure (last 10 days) testing not required  Diagnosis: Acute on chronic diastolic (congestive) heart failure [facility]) [[id number]]  Admitting Physician: Karmen Bongo [[id number]]  Attending Physician: Karmen Bongo [[id number]]  Certification:: I certify this patient will need inpatient services for at least 2 midnights  Estimated Length of Stay: 3          B Medical/Surgery History Past Medical History:  Diagnosis Date   Adenomatous colon polyp 2007   Barrett's esophagus 2007   Cerebellar hemorrhage ([place])    Cervical cancer ([place])    Chronic diastolic CHF (congestive heart failure) ([place])    CKD (chronic kidney disease), stage II    Closed pelvic fracture ([place])     DVT (deep venous thrombosis) ([place])    Fibrocystic breast disease    Fracture of pubic ramus (HCC)    Hyperlipidemia    Hypertension    Iron deficiency anemia    Macular degeneration    Permanent atrial fibrillation ([place])    Pulmonary hypertension ([place])    Tricuspid regurgitation    Past Surgical History:  Procedure Laterality Date   CATARACT EXTRACTION Right 02/15/2010   [facility]   CATARACT EXTRACTION Left 01/30/2010   [facility]   CATARACT EXTRACTION, BILATERAL  2010   TOTAL ABDOMINAL HYSTERECTOMY  1964     A IV Location/Drains/Wounds Patient Lines/Drains/Airways Status     Active Line/Drains/Airways     Name Placement date Placement time Site Days   Peripheral IV 10/11/22 22 G Anterior;Right Forearm 10/11/22  1211  Forearm  less than 1   Wound / Incision (Open or Dehisced) 02/23/21 Non-pressure wound Sacrum Medial Purple/pink area 02/23/21  1944  Sacrum  595            Intake/Output Last 24 hours No intake or output data in the 24 hours ending 10/11/22 2103  Labs/Imaging Results for orders placed or performed during the hospital encounter of 10/11/22 (from the past 48 hour(s))  Resp panel by RT-PCR (RSV, Flu A&B, Covid) Anterior Nasal Swab     Status: None   Collection Time: 10/11/22 11:20 AM   Specimen: Anterior Nasal Swab  Result Value Ref  Range   SARS Coronavirus 2 by RT PCR NEGATIVE NEGATIVE    Comment: (NOTE) SARS-CoV-2 target nucleic acids are NOT DETECTED.  The SARS-CoV-2 RNA is generally detectable in upper respiratory specimens during the acute phase of infection. The lowest concentration of SARS-CoV-2 viral copies this assay can detect is 138 copies/mL. A negative result does not preclude SARS-Cov-2 infection and should not be used as the sole basis for treatment or other patient management decisions. A negative result may occur with  improper specimen collection/handling, submission of specimen other than nasopharyngeal swab, presence of viral  mutation(s) within the areas targeted by this assay, and inadequate number of viral copies(<138 copies/mL). A negative result must be combined with clinical observations, patient history, and epidemiological information. The expected result is Negative.  Fact Sheet for Patients:  EntrepreneurPulse.com.au  Fact Sheet for Healthcare Providers:  IncredibleEmployment.be  This test is no t yet approved or cleared by the [place] FDA and  has been authorized for detection and/or diagnosis of SARS-CoV-2 by FDA under an Emergency Use Authorization (EUA). This EUA will remain  in effect (meaning this test can be used) for the duration of the COVID-19 declaration under Section 564(b)(1) of the Act, 21 U.S.C.section 360bbb-3(b)(1), unless the authorization is terminated  or revoked sooner.       Influenza A by PCR NEGATIVE NEGATIVE   Influenza B by PCR NEGATIVE NEGATIVE    Comment: (NOTE) The Xpert Xpress SARS-CoV-2/FLU/RSV plus assay is intended as an aid in the diagnosis of influenza from Nasopharyngeal swab specimens and should not be used as a sole basis for treatment. Nasal washings and aspirates are unacceptable for Xpert Xpress SARS-CoV-2/FLU/RSV testing.  Fact Sheet for Patients: EntrepreneurPulse.com.au  Fact Sheet for Healthcare Providers: IncredibleEmployment.be  This test is not yet approved or cleared by the [place] FDA and has been authorized for detection and/or diagnosis of SARS-CoV-2 by FDA under an Emergency Use Authorization (EUA). This EUA will remain in effect (meaning this test can be used) for the duration of the COVID-19 declaration under Section 564(b)(1) of the Act, 21 U.S.C. section 360bbb-3(b)(1), unless the authorization is terminated or revoked.     Resp Syncytial Virus by PCR NEGATIVE NEGATIVE    Comment: (NOTE) Fact Sheet for  Patients: EntrepreneurPulse.com.au  Fact Sheet for Healthcare Providers: IncredibleEmployment.be  This test is not yet approved or cleared by the [place] FDA and has been authorized for detection and/or diagnosis of SARS-CoV-2 by FDA under an Emergency Use Authorization (EUA). This EUA will remain in effect (meaning this test can be used) for the duration of the COVID-19 declaration under Section 564(b)(1) of the Act, 21 U.S.C. section 360bbb-3(b)(1), unless the authorization is terminated or revoked.  Performed at [facility], [place] [address], [place],  [address]   Urinalysis, Routine w reflex microscopic Urine, Clean Catch     Status: Abnormal   Collection Time: 10/11/22 11:20 AM  Result Value Ref Range   Color, Urine YELLOW YELLOW   APPearance HAZY (A) CLEAR   Specific Gravity, Urine 1.010 1.005 - 1.030   pH 5.0 5.0 - 8.0   Glucose, UA NEGATIVE NEGATIVE mg/dL   Hgb urine dipstick NEGATIVE NEGATIVE   Bilirubin Urine NEGATIVE NEGATIVE   Ketones, ur NEGATIVE NEGATIVE mg/dL   Protein, ur NEGATIVE NEGATIVE mg/dL   Nitrite NEGATIVE NEGATIVE   Leukocytes,Ua SMALL (A) NEGATIVE   RBC / HPF 0-5 0 - 5 RBC/hpf   WBC, UA 6-10 0 - 5 WBC/hpf  Bacteria, UA FEW (A) NONE SEEN   Squamous Epithelial / HPF 0-5 0 - 5 /HPF   Mucus PRESENT    Hyaline Casts, UA PRESENT     Comment: Performed at [facility], [place] [address], [place], [place] [address]  I-Stat Chem 8, ED     Status: Abnormal   Collection Time: 10/11/22 12:18 PM  Result Value Ref Range   Sodium 142 135 - 145 mmol/L   Potassium 4.5 3.5 - 5.1 mmol/L   Chloride 104 98 - 111 mmol/L   BUN 25 (H) 8 - 23 mg/dL   Creatinine, Ser 1.70 (H) 0.44 - 1.00 mg/dL   Glucose, Bld 104 (H) 70 - 99 mg/dL    Comment: Glucose reference range applies only to samples taken after fasting for at least 8 hours.   Calcium, Ion 1.06 (L) 1.15 - 1.40 mmol/L   TCO2 27 22 - 32 mmol/L   Hemoglobin  12.9 12.0 - 15.0 g/dL   HCT 38.0 36.0 - 46.0 %  Lactic acid, plasma     Status: None   Collection Time: 10/11/22 12:40 PM  Result Value Ref Range   Lactic Acid, Venous 1.4 0.5 - 1.9 mmol/L    Comment: Performed at [facility] [address] [place], [place] [address]  Comprehensive metabolic panel     Status: Abnormal   Collection Time: 10/11/22  2:38 PM  Result Value Ref Range   Sodium 141 135 - 145 mmol/L   Potassium 4.3 3.5 - 5.1 mmol/L   Chloride 105 98 - 111 mmol/L   CO2 26 22 - 32 mmol/L   Glucose, Bld 96 70 - 99 mg/dL    Comment: Glucose reference range applies only to samples taken after fasting for at least 8 hours.   BUN 23 8 - 23 mg/dL   Creatinine, Ser 1.52 (H) 0.44 - 1.00 mg/dL   Calcium 7.9 (L) 8.9 - 10.3 mg/dL   Total Protein 7.0 6.5 - 8.1 g/dL   Albumin 2.5 (L) 3.5 - 5.0 g/dL   AST 20 15 - 41 U/L   ALT 12 0 - 44 U/L   Alkaline Phosphatase 77 38 - 126 U/L   Total Bilirubin 0.7 0.3 - 1.2 mg/dL   GFR, Estimated 32 (L) >60 mL/min    Comment: (NOTE) Calculated using the CKD-EPI Creatinine Equation (2021)    Anion gap 10 5 - 15    Comment: Performed at [facility], [place] [address], [place], [place] [address]  CBC with Differential/Platelet     Status: Abnormal   Collection Time: 10/11/22  2:38 PM  Result Value Ref Range   WBC 7.8 4.0 - 10.5 K/uL   RBC 4.46 3.87 - 5.11 MIL/uL   Hemoglobin 11.2 (L) 12.0 - 15.0 g/dL   HCT 37.6 36.0 - 46.0 %   MCV 84.3 80.0 - 100.0 fL   MCH 25.1 (L) 26.0 - 34.0 pg   MCHC 29.8 (L) 30.0 - 36.0 g/dL   RDW 21.0 (H) 11.5 - 15.5 %   Platelets 300 150 - 400 K/uL   nRBC 0.0 0.0 - 0.2 %   Neutrophils Relative % 81 %   Neutro Abs 6.3 1.7 - 7.7 K/uL   Lymphocytes Relative 12 %   Lymphs Abs 0.9 0.7 - 4.0 K/uL   Monocytes Relative 7 %   Monocytes Absolute 0.6 0.1 - 1.0 K/uL   Eosinophils Relative 0 %   Eosinophils Absolute 0.0 0.0 - 0.5  K/uL   Basophils Relative 0 %   Basophils Absolute 0.0 0.0 - 0.1 K/uL   Immature  Granulocytes 0 %   Abs Immature Granulocytes 0.03 0.00 - 0.07 K/uL    Comment: Performed at [facility], [place] [address], [place], [place] [address]  Magnesium     Status: Abnormal   Collection Time: 10/11/22  2:38 PM  Result Value Ref Range   Magnesium 2.5 (H) 1.7 - 2.4 mg/dL    Comment: Performed at [facility] [address] [place], [place] [address]  Brain natriuretic peptide     Status: Abnormal   Collection Time: 10/11/22  2:39 PM  Result Value Ref Range   B Natriuretic Peptide 612.0 (H) 0.0 - 100.0 pg/mL    Comment: Performed at [facility] [address], [place], [place] [address]  Lactic acid, plasma     Status: Abnormal   Collection Time: 10/11/22  4:41 PM  Result Value Ref Range   Lactic Acid, Venous 2.0 (HH) 0.5 - 1.9 mmol/L    Comment: CRITICAL RESULT CALLED TO, READ BACK BY AND VERIFIED WITH  P. PULLIAM RN 10/11/22 '@1717'$  BY J. WHITE Performed at [facility] [address] [place], [place] [address]    DG Chest Port 1 View  Result Date: 10/11/2022 CLINICAL DATA:  Fatigue. EXAM: PORTABLE CHEST 1 VIEW COMPARISON:  Chest x-ray dated Feb 23, 2021. FINDINGS: Chronic cardiomegaly. Increased pulmonary vascular congestion and mild diffuse interstitial thickening. New small bilateral pleural effusions and bibasilar atelectasis. No pneumothorax. No acute osseous abnormality. IMPRESSION: 1. Mild congestive heart failure. Electronically Signed   By: Titus Dubin M.D.   On: 10/11/2022 12:28    Pending Labs Unresulted Labs (From admission, onward)     Start     Ordered   10/12/22 1916  Basic metabolic panel  Tomorrow morning,   R        10/11/22 1808   10/12/22 0500  CBC  Tomorrow morning,   R        10/11/22 1808   10/11/22 1122  Blood Culture (routine x 2)  (Undifferentiated presentation (screening labs and basic nursing orders))  BLOOD CULTURE X 2,   STAT      10/11/22 1122   10/11/22 1122  Urine Culture  (Undifferentiated presentation (screening  labs and basic nursing orders))  ONCE - URGENT,   URGENT       Question:  Indication  Answer:  Altered mental status (if no other cause identified)   10/11/22 1122            Vitals/Pain Today's Vitals   10/11/22 1613 10/11/22 1630 10/11/22 1700 10/11/22 2035  BP:  98/73 114/75 99/70  Pulse:  94 86 (!) 104  Resp:  (!) 44 (!) 41 (!) 32  Temp: (!) 97.5 F (36.4 C)   (!) 97.4 F (36.3 C)  TempSrc: Oral   Oral  SpO2:  100% 97% 98%  Weight:    48.9 kg  Height:    5' (1.524 m)  PainSc:        Isolation Precautions No active isolations  Medications Medications  atorvastatin (LIPITOR) tablet 20 mg (has no administration in time range)  metoprolol succinate (TOPROL-XL) 24 hr tablet 75 mg (has no administration in time range)  apixaban (ELIQUIS) tablet 2.5 mg (has no administration in time range)  brimonidine (ALPHAGAN) 0.2 % ophthalmic solution 1 drop (has no administration in time range)  dorzolamide-timolol (COSOPT)  2-0.5 % ophthalmic solution 1 drop (has no administration in time range)  latanoprost (XALATAN) 0.005 % ophthalmic solution 1 drop (has no administration in time range)  furosemide (LASIX) injection 40 mg (has no administration in time range)  sodium chloride flush (NS) 0.9 % injection 3 mL (has no administration in time range)  acetaminophen (TYLENOL) tablet 650 mg (has no administration in time range)    Or  acetaminophen (TYLENOL) suppository 650 mg (has no administration in time range)  morphine (PF) 2 MG/ML injection 2 mg (has no administration in time range)  traZODone (DESYREL) tablet 25 mg (has no administration in time range)  docusate sodium (COLACE) capsule 100 mg (has no administration in time range)  polyethylene glycol (MIRALAX / GLYCOLAX) packet 17 g (has no administration in time range)  bisacodyl (DULCOLAX) EC tablet 5 mg (has no administration in time range)  ondansetron (ZOFRAN) tablet 4 mg (has no administration in time range)    Or   ondansetron (ZOFRAN) injection 4 mg (has no administration in time range)  hydrALAZINE (APRESOLINE) injection 5 mg (has no administration in time range)  lactated ringers bolus 250 mL (0 mLs Intravenous Stopped 10/11/22 1656)  furosemide (LASIX) injection 40 mg (40 mg Intravenous Given 10/11/22 1607)    Mobility non-ambulatory High fall risk   Focused Assessments Neuro Assessment Handoff:  Swallow screen pass? Yes  Cardiac Rhythm: Atrial fibrillation       Neuro Assessment: Exceptions to [facility] Neuro Checks:      Has TPA been given? No If patient is a Neuro Trauma and patient is going to OR before floor call report to [place] nurse: [phone] or [phone]   R Recommendations: See Admitting Provider Note  Report given to:   Additional Notes:

## 2022-10-11 NOTE — H&P (Signed)
History and Physical    Patient: Kelli Peterson PFX:[id number] DOB: Oct 16, 1929 DOA: 10/11/2022 DOS: the patient was seen and examined on 10/11/2022 PCP: [name], DO  Patient coming from: Home - lives with daughter; NOK: Daughter, Debbe Odea, [phone]   Chief Complaint: SOB  HPI: Kelli Peterson is a 89 y.o. female with medical history significant of ICH; chronic diastolic CHF; HTN; HLD; and afib presenting with SOB.  The patient is chronically quite debilitated, non-ambulatory although able to transfer, with chronic dysphagia (unwilling to have a feeding tube, understands the risks of aspiration and declines swallow evaluation).  She has been "up and down" since Christmas.  + SOB, some LE edema.  Not eating well, fatigued.  Her daughter checked her O2 sats last night and she was in the 60-80 range, never higher than 81% so she called 911.  Primary goal is comfort, although she is open to brief hospitalization for stabilization with plan for dc back to home (no rehab).  They previously had 1 palliative care consult and would like to see palliative care again.  If no echo done in the last year, they would like a repeat echo.    ER Course:  h/o CHF, here with SOB, decreased appetite and fatigue.  88% with EMS, again with desats in ER.  AKI, creatinine 1 -> 1.5, BNP elevated, edema on CXR.  Given Lasix, needs admission.     Review of Systems: As mentioned in the history of present illness. All other systems reviewed and are negative. Past Medical History:  Diagnosis Date   Adenomatous colon polyp 2007   Barrett's esophagus 2007   Cerebellar hemorrhage (HCC)    Cervical cancer (HCC)    Chronic diastolic CHF (congestive heart failure) (HCC)    CKD (chronic kidney disease), stage II    Closed pelvic fracture (HCC)    DVT (deep venous thrombosis) (HCC)    Fibrocystic breast disease    Fracture of pubic ramus (HCC)    Hyperlipidemia    Hypertension    Iron deficiency anemia     Macular degeneration    Permanent atrial fibrillation (HCC)    Pulmonary hypertension (HCC)    Tricuspid regurgitation    Past Surgical History:  Procedure Laterality Date   CATARACT EXTRACTION Right 02/15/2010   [facility]   CATARACT EXTRACTION Left 01/30/2010   [facility]   CATARACT EXTRACTION, BILATERAL  2010   TOTAL ABDOMINAL HYSTERECTOMY  1964   Social History:  reports that she has never smoked. She has never used smokeless tobacco. She reports that she does not drink alcohol and does not use drugs.  Allergies  Allergen Reactions   Penicillins Swelling and Other (See Comments)    Ankles and feet swell   Zocor [Simvastatin] Other (See Comments)    Weak and dizzy   Nsaids Other (See Comments)    Tylenol is all that is permitted    Family History  Problem Relation Age of Onset   CVA Father    CVA Mother     Prior to Admission medications   Medication Sig Start Date End Date Taking? Authorizing Provider  acetaminophen (TYLENOL) 500 MG tablet Take 2 tablets (1,000 mg total) by mouth 3 (three) times daily. 03/03/21   Ghimire, Henreitta Leber, MD  apixaban (ELIQUIS) 2.5 MG TABS tablet Take 1 tablet (2.5 mg total) by mouth 2 (two) times daily. 03/20/21   Medina-Vargas, Monina C, NP  atorvastatin (LIPITOR) 20 MG tablet Take 1 tablet (20 mg total) by  mouth daily. 03/20/21   Medina-Vargas, Monina C, NP  bisacodyl (DULCOLAX) 10 MG suppository Place 10 mg rectally as needed for moderate constipation.    [provider]  brimonidine (ALPHAGAN) 0.2 % ophthalmic solution Place 1 drop into the right eye 2 (two) times daily. 03/20/21   Medina-Vargas, Monina C, NP  denosumab (PROLIA) 60 MG/ML SOSY injection Inject 60 mg into the skin every 6 (six) months.    [provider]  diltiazem (CARDIZEM CD) 240 MG 24 hr capsule Take 1 capsule (240 mg total) by mouth daily. 03/20/21 04/19/21  Medina-Vargas, Monina C, NP  dorzolamide-timolol (COSOPT) 22.3-6.8 MG/ML ophthalmic solution Place 1 drop  into both eyes 2 (two) times daily. 03/20/21   Medina-Vargas, Monina C, NP  latanoprost (XALATAN) 0.005 % ophthalmic solution Place 1 drop into both eyes every evening. 03/20/21   Medina-Vargas, Monina C, NP  Magnesium Hydroxide (MILK OF MAGNESIA PO) Take 30 mLs by mouth as needed.    [provider]  metoprolol succinate (TOPROL-XL) 25 MG 24 hr tablet Take 2 tablets (50 mg total) by mouth daily. 03/20/21 04/19/21  Medina-Vargas, Monina C, NP  Multiple Vitamin (MULTIVITAMIN WITH MINERALS) TABS tablet Take 1 tablet by mouth daily.    [provider]  polyethylene glycol (MIRALAX / GLYCOLAX) 17 g packet Take 17 g by mouth daily. 03/03/21   Ghimire, Henreitta Leber, MD  potassium chloride (KLOR-CON) 10 MEQ tablet Take 1 tablet (10 mEq total) by mouth daily. 03/20/21 04/19/21  Medina-Vargas, Monina C, NP  Sodium Phosphates (RA SALINE ENEMA RE) Place rectally as needed.    [provider]  torsemide (DEMADEX) 20 MG tablet Take 1 tablet (20 mg total) by mouth daily. 03/20/21 04/19/21  Medina-Vargas, Jaymes Graff C, NP    Physical Exam: Vitals:   10/11/22 1600 10/11/22 1613 10/11/22 1630 10/11/22 1700  BP: 114/81  98/73 114/75  Pulse: (!) 37  94 86  Resp: 16  (!) 44 (!) 41  Temp:  (!) 97.5 F (36.4 C)    TempSrc:  Oral    SpO2: 95%  100% 97%   General:  Appears calm and comfortable and is in NAD, extremely debilitated with kyphosis Eyes:  EOMI, normal lids, iris ENT:   hard of hearing, grossly normal lips & tongue, mmm; absent lower dentition Neck:  no LAD, masses or thyromegaly Cardiovascular:  Irregularly irregular with rate control, no m/r/g. 1+ pedal edema Respiratory:   CTA bilaterally with no wheezes/rales/rhonchi.  Normal respiratory effort on [place] O2. Abdomen:  soft, NT, ND Skin:  no rash or induration seen on limited exam Musculoskeletal:  no bony abnormality Psychiatric:  blunted mood and affect, speech dysarthric but appropriate, AOx3 Neurologic:  R facial droop, moves all  extremities in coordinated fashion   Radiological Exams on Admission: Independently reviewed - see discussion in A/P where applicable  DG Chest Port 1 View  Result Date: 10/11/2022 CLINICAL DATA:  Fatigue. EXAM: PORTABLE CHEST 1 VIEW COMPARISON:  Chest x-ray dated Feb 23, 2021. FINDINGS: Chronic cardiomegaly. Increased pulmonary vascular congestion and mild diffuse interstitial thickening. New small bilateral pleural effusions and bibasilar atelectasis. No pneumothorax. No acute osseous abnormality. IMPRESSION: 1. Mild congestive heart failure. Electronically Signed   By: Titus Dubin M.D.   On: 10/11/2022 12:28    EKG: Independently reviewed.  Afib with rate 98; nonspecific ST changes with no evidence of acute ischemia   Labs on Admission: I have personally reviewed the available labs and imaging studies at the time of the  admission.  Pertinent labs:    BUN 23/Creatinine 1.52/GFR 32; 25/1.05/51 on 02/28/21 Albumin 2.5 BNP 612 Lactate 1.4 Unremarkable CBC   Assessment and Plan: Principal Problem:   Acute respiratory failure with hypoxia (HCC) Active Problems:   Essential hypertension   Atrial fibrillation, chronic (HCC)   Glaucoma   Hyperlipidemia   CKD (chronic kidney disease), stage III (HCC)   Acute on chronic diastolic (congestive) heart failure (HCC)   Acute kidney injury superimposed on chronic kidney disease (HCC)   Dysphagia   Chronic pulmonary aspiration    Acute respiratory failure with hypoxia -Patient presenting with SOB -She has not been eating or drinking well but has known chronic aspiration -While CHF may be present, I am also concerned about chronic aspiration pneumonitis -Will admit to cardiac telemetry for further evaluation  Dysphagia/chronic aspiration -Her daughter reports that this is a chronic issue, that they are aware, and that there is "nothing else they can do" -As such, she does not want to withhold food/beverages -Will order speech  therapy evaluation to see if further modifications may be appropriate -However, the primary goal of care is comfort and the patient herself was clear that she would not desire a feeding tube -There is no obvious evidence of infection at this time so with withhold abx, as a retrospective analysis of antibiotics vs. Supportive care in treatment of aspiration indicated no different in hospital mortality or ICU transfers but more antibiotic escalation.    Acute on chronic diastolic CHF -Last echo was in 02/2021 and showed preserved EF and grade 3 diastolic dysfunction -She has mild pedal edema without gross volume overload -BNP is significantly elevated compared to prior -CXR read as pulmonary edema -With elevated BNP and abnl CXR, acute decompensated CHF seems probable as at least a contributing diagnosis -Will request echocardiogram; while she is quite debilitated and her overall goal is comfort, this may help further with advanced care planning -CHF order set utilized -Was given Lasix 40 mg x 1 in ER and will repeat with 40 mg IV BID for now -Continue [place] O2 for now  AKI on stage 3 CKD -Last BMP was 18 months ago - which makes it relatively unhelpful -She does have worsened renal function now compared to then -She has likely had progression of renal disease to stage 3b, but there also may be an acute component -Will recheck in AM -If worsening, would stop diuresis -Avoid nephrotoxic agents  HTN -Continue metoprolol 75 mg daily. -Will also add prn hydralazine  HLD -Continue Lipitor 20 mg daily  Afib -Rate controlled with Toprol XL -Continue Eliquis 2.5 mg BID  Glaucoma -Continue latanoprost, Cosopt, brimonidine  DNR -I have discussed code status with the patient and her daughter and  they are in agreement that the patient would not desire resuscitation and would prefer to die a natural death should that situation arise. -She will need a gold out of facility DNR form at the time of  discharge      Advance Care Planning:   Code Status: DNR   Consults: Palliative care; CHF navigator; TOC team; nutrition; PT/OT; ST  DVT Prophylaxis: Eliquis  Family Communication: Daughter was present throughout evaluation  Severity of Illness: The appropriate patient status for this patient is INPATIENT. Inpatient status is judged to be reasonable and necessary in order to provide the required intensity of service to ensure the patient's safety. The patient's presenting symptoms, physical exam findings, and initial radiographic and laboratory data in the context of their  chronic comorbidities is felt to place them at high risk for further clinical deterioration. Furthermore, it is not anticipated that the patient will be medically stable for discharge from the hospital within 2 midnights of admission.   * I certify that at the point of admission it is my clinical judgment that the patient will require inpatient hospital care spanning beyond 2 midnights from the point of admission due to high intensity of service, high risk for further deterioration and high frequency of surveillance required.*  Author: Karmen Bongo, MD 10/11/2022 6:08 PM  For on call review [email or web address].

## 2022-10-11 NOTE — ED Notes (Signed)
Patient refuses to take any water for swallow eval, patient keep pushing  my hand away and turn her head on opposite side. Po meds are hold for now, also this note was communicated to floor rn

## 2022-10-12 ENCOUNTER — Other Ambulatory Visit (HOSPITAL_COMMUNITY): Payer: Medicare Other

## 2022-10-12 ENCOUNTER — Inpatient Hospital Stay (HOSPITAL_COMMUNITY): Payer: Medicare Other

## 2022-10-12 DIAGNOSIS — J9601 Acute respiratory failure with hypoxia: Secondary | ICD-10-CM | POA: Diagnosis not present

## 2022-10-12 LAB — BASIC METABOLIC PANEL
Anion gap: 9 (ref 5–15)
BUN: 24 mg/dL — ABNORMAL HIGH (ref 8–23)
CO2: 25 mmol/L (ref 22–32)
Calcium: 7.4 mg/dL — ABNORMAL LOW (ref 8.9–10.3)
Chloride: 106 mmol/L (ref 98–111)
Creatinine, Ser: 1.55 mg/dL — ABNORMAL HIGH (ref 0.44–1.00)
GFR, Estimated: 32 mL/min — ABNORMAL LOW (ref >60)
Glucose, Bld: 99 mg/dL (ref 70–99)
Potassium: 4.1 mmol/L (ref 3.5–5.1)
Sodium: 140 mmol/L (ref 135–145)

## 2022-10-12 LAB — CBC
HCT: 33.1 % — ABNORMAL LOW (ref 36.0–46.0)
Hemoglobin: 9.5 g/dL — ABNORMAL LOW (ref 12.0–15.0)
MCH: 24.1 pg — ABNORMAL LOW (ref 26.0–34.0)
MCHC: 28.7 g/dL — ABNORMAL LOW (ref 30.0–36.0)
MCV: 84 fL (ref 80.0–100.0)
Platelets: 269 10*3/uL (ref 150–400)
RBC: 3.94 MIL/uL (ref 3.87–5.11)
RDW: 20.2 % — ABNORMAL HIGH (ref 11.5–15.5)
WBC: 8.8 10*3/uL (ref 4.0–10.5)
nRBC: 0.2 % (ref 0.0–0.2)

## 2022-10-12 NOTE — Progress Notes (Signed)
SLP Cancellation Note  Patient Details Name: Kelli Peterson MRN: [id number] DOB: 05-04-1930   Cancelled treatment:        Orders for swallow evaluation received and appreciated.  Pt with known hx of oropharyngeal dysphagia.  Consumes unmodified diet at baseline for QOL.  Daughter politely declines speech therapy assessment at this time. SLP will sign off.    Celedonio Savage, MA, [place] Office: [phone] 10/12/2022, 9:30 AM

## 2022-10-12 NOTE — Progress Notes (Signed)
   10/12/22 1600  Assess: MEWS Score  Temp (!) 97.2 F (36.2 C)  BP (!) 91/54  MAP (mmHg) (!) 63  Pulse Rate (!) 103  ECG Heart Rate (!) 111  Resp 20  SpO2 99 %  Assess: MEWS Score  MEWS Temp 0  MEWS Systolic 1  MEWS Pulse 2  MEWS RR 0  MEWS LOC 0  MEWS Score 3  MEWS Score Color Yellow  Assess: if the MEWS score is Yellow or Red  Were vital signs taken at a resting state? Yes  Focused Assessment No change from prior assessment  Does the patient meet 2 or more of the SIRS criteria? No  Does the patient have a confirmed or suspected source of infection? No  Provider and Rapid Response Notified? Yes  MEWS guidelines implemented *See Row Information* No, other (Comment) (Yellow MEWS)  Treat  MEWS Interventions Administered scheduled meds/treatments  Escalate  MEWS: Escalate Yellow: discuss with charge nurse/RN and consider discussing with provider and RRT  Notify: Charge Nurse/RN  Name of Charge Nurse/RN Notified Mikle Bosworth RN  Date Charge Nurse/RN Notified 10/12/22  Time Charge Nurse/RN Notified 1614  Provider Notification  Provider Name/Title Dr. Broadus John  Date Provider Notified 10/12/22  Time Provider Notified 1610  Method of Notification Page  Notification Reason Other (Comment) (yellow MEWS)  Provider response No new orders  Date of Provider Response 10/12/22  Time of Provider Response 1612  Document  Progress note created (see row info) Yes  Assess: SIRS CRITERIA  SIRS Temperature  0  SIRS Pulse 1  SIRS Respirations  0  SIRS WBC 0  SIRS Score Sum  1

## 2022-10-12 NOTE — Progress Notes (Signed)
OT Cancellation Note  Patient Details Name: Kelli Peterson MRN: [id number] DOB: [birth year]   Cancelled Treatment:    Reason Eval/Treat Not Completed: Other (comment) (Pt awaiting palliative consult, daughter declining PT/OT evaluation at this time. Will follow up as appropriate.)  Renaye Rakers, OTD, OTR/L [facility] [phone]  Ulla Gallo 10/12/2022, 11:16 AM

## 2022-10-12 NOTE — Progress Notes (Signed)
Initial Nutrition Assessment  DOCUMENTATION CODES:   Non-severe (moderate) malnutrition in context of chronic illness  INTERVENTION:  Liberalize diet from a heart healthy to a regular diet to provide widest variety of menu options to enhance nutritional adequacy Magic cup TID with meals, each supplement provides 290 kcal and 9 grams of protein Ice pops from floor stock and with each meal as desired  NUTRITION DIAGNOSIS:   Moderate Malnutrition related to chronic illness (CHF, chronic dysphagia) as evidenced by mild fat depletion, moderate fat depletion, severe muscle depletion.  GOAL:   Patient will meet greater than or equal to 90% of their needs  MONITOR:   PO intake, Labs, Weight trends  REASON FOR ASSESSMENT:   Consult Other (Comment) (nutrition goals)  ASSESSMENT:   Pt admitted with acute respiratory failure with hypoxia. PMH significant for ICH, chronic diastolic CHF, HTN, HLD and afib.  In H&P pt noted to have chronic dysphagia (unwilling to have a feeding tube, understands the risks of aspiration and declines swallow evaluation). Primary goal is comfort, though open to brief hospitalization.   Spoke with pt and her daughter present at bedside. Her daughter provided history for patient. She states that she typically eats very little at home but that she ate really well for dinner and breakfast as she was more alert than usual. Pt usually wears lower denture plates but they have not been fitting well so pt has not been wearing them. Her daughter has been ordering all of her meals and has been able to order foods she can tolerate. Pt has chronic dysphagia which she has been evaluated for many times but she has difficulty following recommendations. Pt's daughter states that she has been on a thickened liquid diet at one time but pt will not drink. She has difficulty consuming enough fluids at home as she is only able to tolerate sips without coughing. Pt has received ice pops  and enjoys these and can tolerate without coughing fits. Pt's daughter declines additional SLP evaluation.   Meal completions: 1/9: 25% breakfast, 30% lunch  Pt's daughter reports that her usual weight is about 105 lbs. She states that she does not feel as though she looks like she has lost weight based on her observation.Chart provides limited documentation of weight history within the last year.   Pt has had very limited mobility for a while. She is able to scoot herself from bed to bedside commode but is primarily bedbound.   Medications: colace, lasix '40mg'$  BID  Labs: BUN 24, Cr 1.55, GFR 32  NUTRITION - FOCUSED PHYSICAL EXAM:  Flowsheet Row Most Recent Value  Orbital Region Moderate depletion  Upper Arm Region Mild depletion  Thoracic and Lumbar Region Mild depletion  Buccal Region Moderate depletion  Temple Region Severe depletion  Clavicle Bone Region Severe depletion  Clavicle and Acromion Bone Region Moderate depletion  Scapular Bone Region Moderate depletion  Dorsal Hand Unable to assess  [in mits]  Patellar Region Severe depletion  Anterior Thigh Region Severe depletion  Posterior Calf Region Severe depletion  Edema (RD Assessment) None  Hair Reviewed  Eyes Reviewed  Mouth Other (Comment)  [edentulous]  Skin Reviewed  Nails Unable to assess      Diet Order:   Diet Order             Diet regular Room service appropriate? Yes with Assist; Fluid consistency: Thin; Fluid restriction: 1500 mL Fluid  Diet effective now  EDUCATION NEEDS:   No education needs have been identified at this time  Skin:  Skin Assessment: Skin Integrity Issues: Skin Integrity Issues:: Stage I Stage I: coccyx  Last BM:  1/8  Height:   Ht Readings from Last 1 Encounters:  10/11/22 5' (1.524 m)    Weight:   Wt Readings from Last 1 Encounters:  10/12/22 49.8 kg   BMI:  Body mass index is 21.44 kg/m.  Estimated Nutritional Needs:   Kcal:   1200-1400  Protein:  60-75g  Fluid:  1.2-1.4L  Clayborne Dana, RDN, LDN Clinical Nutrition

## 2022-10-12 NOTE — Progress Notes (Signed)
PT Cancellation Note  Patient Details Name: Kelli Peterson MRN: [id number] DOB: 04-03-1930   Cancelled Treatment:    Reason Eval/Treat Not Completed: Other (comment) Pt awaiting palliative consult, daughter declining PT/OT evaluation at this time   Wyona Almas, PT, [place] 10/12/2022, 1:30 PM

## 2022-10-12 NOTE — Plan of Care (Signed)

## 2022-10-12 NOTE — Progress Notes (Signed)
PROGRESS NOTE    Kelli Peterson  ONG:[id number] DOB: 1929-10-21 DOA: 10/11/2022 PCP: [name], DO  89/F with history of ICH, chronic diastolic CHF, paroxysmal A-fib, hypertension, chronic dysphagia lives at home with her daughter, progressively getting weaker, has become almost total care for the last 6 months, fluctuating mental status, was brought to the ED with increased shortness of breath, tachypnea, low oxygen sats. -In the ED hypoxic, creatinine 1.5, elevated BNP, mild pulmonary edema on x-ray  Subjective: Frail, weak, had a few bites for breakfast, denies any new complaints  Assessment and Plan:  Acute hypoxic respiratory failure Acute on chronic diastolic CHF -Continue IV Lasix today, albumin is 2.5 contributing to third spacing -Very poor functional status, prognosis is poor -Would not push GDMT at this stage, will cancel echo -Palliative consulted for goals of care  Dysphagia Chronic aspiration -Concern for recurrent aspiration, will repeat x-ray, SLP eval  Ongoing failure to thrive, severe debility -I think she would benefit from home with hospice services, discussed with daughter -Palliative consulted for goals of care  Paroxysmal atrial fibrillation -Continue Toprol and Eliquis  Severe protein calorie malnutrition -Albumin is 2.5  CKD 3a -Creatinine relatively stable  Acute on chronic anemia -No workup planned  DVT prophylaxis: Apixaban Code Status: DNR Family Communication: Discussed with daughter Disposition Plan: Possibly home with hospice  Consultants: Palliative consult   Procedures:   Antimicrobials:    Objective: Vitals:   10/11/22 2202 10/12/22 0458 10/12/22 0509 10/12/22 0753  BP: 107/78  (!) 152/94 102/75  Pulse: 79   (!) 102  Resp: '14  20 20  '$ Temp: (!) 97.3 F (36.3 C)  (!) 97.3 F (36.3 C) (!) 97.1 F (36.2 C)  TempSrc: Axillary  Axillary Axillary  SpO2: 92%  92% 98%  Weight: 49.9 kg 49.9 kg 49.8 kg   Height:         Intake/Output Summary (Last 24 hours) at 10/12/2022 0948 Last data filed at 10/12/2022 0757 Gross per 24 hour  Intake 30 ml  Output --  Net 30 ml   Filed Weights   10/11/22 2202 10/12/22 0458 10/12/22 0509  Weight: 49.9 kg 49.9 kg 49.8 kg    Examination:  General exam: Chronically ill debilitated female laying in bed, eyes open, mumbles few words, no distress CVS: S1-S2, regular rhythm Lungs: Decreased breath sounds at the bases Abdomen: Soft, nontender, bowel sounds present Extremities: Trace edema Skin: No rashes Psychiatry: Flat affect    Data Reviewed:   CBC: Recent Labs  Lab 10/11/22 1218 10/11/22 1438 10/12/22 0058  WBC  --  7.8 8.8  NEUTROABS  --  6.3  --   HGB 12.9 11.2* 9.5*  HCT 38.0 37.6 33.1*  MCV  --  84.3 84.0  PLT  --  300 440   Basic Metabolic Panel: Recent Labs  Lab 10/11/22 1218 10/11/22 1438 10/12/22 0058  NA 142 141 140  K 4.5 4.3 4.1  CL 104 105 106  CO2  --  26 25  GLUCOSE 104* 96 99  BUN 25* 23 24*  CREATININE 1.70* 1.52* 1.55*  CALCIUM  --  7.9* 7.4*  MG  --  2.5*  --    GFR: Estimated Creatinine Clearance: 17.3 mL/min (A) (by C-G formula based on SCr of 1.55 mg/dL (H)). Liver Function Tests: Recent Labs  Lab 10/11/22 1438  AST 20  ALT 12  ALKPHOS 77  BILITOT 0.7  PROT 7.0  ALBUMIN 2.5*   No results for input(s): "LIPASE", "AMYLASE"  in the last 168 hours. No results for input(s): "AMMONIA" in the last 168 hours. Coagulation Profile: No results for input(s): "INR", "PROTIME" in the last 168 hours. Cardiac Enzymes: No results for input(s): "CKTOTAL", "CKMB", "CKMBINDEX", "TROPONINI" in the last 168 hours. BNP (last 3 results) No results for input(s): "PROBNP" in the last 8760 hours. HbA1C: No results for input(s): "HGBA1C" in the last 72 hours. CBG: No results for input(s): "GLUCAP" in the last 168 hours. Lipid Profile: No results for input(s): "CHOL", "HDL", "LDLCALC", "TRIG", "CHOLHDL", "LDLDIRECT" in the last  72 hours. Thyroid Function Tests: No results for input(s): "TSH", "T4TOTAL", "FREET4", "T3FREE", "THYROIDAB" in the last 72 hours. Anemia Panel: No results for input(s): "VITAMINB12", "FOLATE", "FERRITIN", "TIBC", "IRON", "RETICCTPCT" in the last 72 hours. Urine analysis:    Component Value Date/Time   COLORURINE YELLOW 10/11/2022 1120   APPEARANCEUR HAZY (A) 10/11/2022 1120   LABSPEC 1.010 10/11/2022 1120   PHURINE 5.0 10/11/2022 1120   GLUCOSEU NEGATIVE 10/11/2022 1120   HGBUR NEGATIVE 10/11/2022 1120   BILIRUBINUR NEGATIVE 10/11/2022 1120   KETONESUR NEGATIVE 10/11/2022 1120   PROTEINUR NEGATIVE 10/11/2022 1120   NITRITE NEGATIVE 10/11/2022 1120   LEUKOCYTESUR SMALL (A) 10/11/2022 1120   Sepsis Labs: '@LABRCNTIP'$ (procalcitonin:4,lacticidven:4)  ) Recent Results (from the past 240 hour(s))  Resp panel by RT-PCR (RSV, Flu A&B, Covid) Anterior Nasal Swab     Status: None   Collection Time: 10/11/22 11:20 AM   Specimen: Anterior Nasal Swab  Result Value Ref Range Status   SARS Coronavirus 2 by RT PCR NEGATIVE NEGATIVE Final    Comment: (NOTE) SARS-CoV-2 target nucleic acids are NOT DETECTED.  The SARS-CoV-2 RNA is generally detectable in upper respiratory specimens during the acute phase of infection. The lowest concentration of SARS-CoV-2 viral copies this assay can detect is 138 copies/mL. A negative result does not preclude SARS-Cov-2 infection and should not be used as the sole basis for treatment or other patient management decisions. A negative result may occur with  improper specimen collection/handling, submission of specimen other than nasopharyngeal swab, presence of viral mutation(s) within the areas targeted by this assay, and inadequate number of viral copies(<138 copies/mL). A negative result must be combined with clinical observations, patient history, and epidemiological information. The expected result is Negative.  Fact Sheet for Patients:   EntrepreneurPulse.com.au  Fact Sheet for Healthcare Providers:  IncredibleEmployment.be  This test is no t yet approved or cleared by the [place] FDA and  has been authorized for detection and/or diagnosis of SARS-CoV-2 by FDA under an Emergency Use Authorization (EUA). This EUA will remain  in effect (meaning this test can be used) for the duration of the COVID-19 declaration under Section 564(b)(1) of the Act, 21 U.S.C.section 360bbb-3(b)(1), unless the authorization is terminated  or revoked sooner.       Influenza A by PCR NEGATIVE NEGATIVE Final   Influenza B by PCR NEGATIVE NEGATIVE Final    Comment: (NOTE) The Xpert Xpress SARS-CoV-2/FLU/RSV plus assay is intended as an aid in the diagnosis of influenza from Nasopharyngeal swab specimens and should not be used as a sole basis for treatment. Nasal washings and aspirates are unacceptable for Xpert Xpress SARS-CoV-2/FLU/RSV testing.  Fact Sheet for Patients: EntrepreneurPulse.com.au  Fact Sheet for Healthcare Providers: IncredibleEmployment.be  This test is not yet approved or cleared by the [place] FDA and has been authorized for detection and/or diagnosis of SARS-CoV-2 by FDA under an Emergency Use Authorization (EUA). This EUA will remain in effect (meaning this  test can be used) for the duration of the COVID-19 declaration under Section 564(b)(1) of the Act, 21 U.S.C. section 360bbb-3(b)(1), unless the authorization is terminated or revoked.     Resp Syncytial Virus by PCR NEGATIVE NEGATIVE Final    Comment: (NOTE) Fact Sheet for Patients: EntrepreneurPulse.com.au  Fact Sheet for Healthcare Providers: IncredibleEmployment.be  This test is not yet approved or cleared by the [place] FDA and has been authorized for detection and/or diagnosis of SARS-CoV-2 by FDA under an Emergency Use  Authorization (EUA). This EUA will remain in effect (meaning this test can be used) for the duration of the COVID-19 declaration under Section 564(b)(1) of the Act, 21 U.S.C. section 360bbb-3(b)(1), unless the authorization is terminated or revoked.  Performed at [facility], [place] [address], [place], [place] [address]   Blood Culture (routine x 2)     Status: None (Preliminary result)   Collection Time: 10/11/22 11:55 AM   Specimen: BLOOD RIGHT FOREARM  Result Value Ref Range Status   Specimen Description BLOOD RIGHT FOREARM  Final   Special Requests   Final    BOTTLES DRAWN AEROBIC ONLY Blood Culture results may not be optimal due to an inadequate volume of blood received in culture bottles   Culture   Final    NO GROWTH < 24 HOURS Performed at [facility], [place] [address], [place], [place] [address]    Report Status PENDING  Incomplete  Blood Culture (routine x 2)     Status: None (Preliminary result)   Collection Time: 10/11/22 12:10 PM   Specimen: BLOOD  Result Value Ref Range Status   Specimen Description BLOOD LEFT ANTECUBITAL  Final   Special Requests   Final    BOTTLES DRAWN AEROBIC AND ANAEROBIC Blood Culture results may not be optimal due to an inadequate volume of blood received in culture bottles   Culture   Final    NO GROWTH < 24 HOURS Performed at [facility], [place] [address] [place], [place] [address]    Report Status PENDING  Incomplete     Radiology Studies: DG Chest Port 1 View  Result Date: 10/11/2022 CLINICAL DATA:  Fatigue. EXAM: PORTABLE CHEST 1 VIEW COMPARISON:  Chest x-ray dated Feb 23, 2021. FINDINGS: Chronic cardiomegaly. Increased pulmonary vascular congestion and mild diffuse interstitial thickening. New small bilateral pleural effusions and bibasilar atelectasis. No pneumothorax. No acute osseous abnormality. IMPRESSION: 1. Mild congestive heart failure. Electronically Signed   By: Titus Dubin M.D.   On: 10/11/2022 12:28      Scheduled Meds:  apixaban  2.5 mg Oral BID   atorvastatin  20 mg Oral Daily   brimonidine  1 drop Right Eye BID   docusate sodium  100 mg Oral BID   dorzolamide-timolol  1 drop Both Eyes BID   furosemide  40 mg Intravenous BID   latanoprost  1 drop Both Eyes QPM   metoprolol succinate  75 mg Oral Daily   sodium chloride flush  3 mL Intravenous Q12H   Continuous Infusions:   LOS: 1 day    Time spent: 23mn  PDomenic Polite MD [facility]   10/12/2022, 9:48 AM

## 2022-10-12 NOTE — TOC Progression Note (Signed)
Transition of Care [facility]) - Progression Note    Patient Details  Name: Kelli Peterson MRN: [id number] Date of Birth: October 31, 1929  Transition of Care [facility]) CM/SW Contact  Zenon Mayo, RN Phone Number: 10/12/2022, 10:18 AM  Clinical Narrative:     From home with daughter, acute resp failure, on 4 liters, , daughter wants to speak with Palliative.  TOC following.        Expected Discharge Plan and Services                                               Social Determinants of Health (SDOH) Interventions SDOH Screenings   Tobacco Use: Low Risk  (10/11/2022)    Readmission Risk Interventions     No data to display

## 2022-10-12 NOTE — Progress Notes (Signed)
Heart Failure Navigator Progress Note  Assessed for Heart & Vascular TOC clinic readiness.  Patient does not meet criteria due to nonambulatory, debilitated, has a Palliative consult..   Navigator will sign off at this time.   Earnestine Leys, BSN, Clinical cytogeneticist Only

## 2022-10-13 DIAGNOSIS — N390 Urinary tract infection, site not specified: Secondary | ICD-10-CM | POA: Diagnosis present

## 2022-10-13 DIAGNOSIS — I482 Chronic atrial fibrillation, unspecified: Secondary | ICD-10-CM | POA: Diagnosis not present

## 2022-10-13 DIAGNOSIS — Z66 Do not resuscitate: Secondary | ICD-10-CM

## 2022-10-13 DIAGNOSIS — I10 Essential (primary) hypertension: Secondary | ICD-10-CM | POA: Diagnosis not present

## 2022-10-13 DIAGNOSIS — R627 Adult failure to thrive: Secondary | ICD-10-CM

## 2022-10-13 DIAGNOSIS — I5033 Acute on chronic diastolic (congestive) heart failure: Secondary | ICD-10-CM

## 2022-10-13 DIAGNOSIS — N189 Chronic kidney disease, unspecified: Secondary | ICD-10-CM

## 2022-10-13 DIAGNOSIS — J449 Chronic obstructive pulmonary disease, unspecified: Secondary | ICD-10-CM

## 2022-10-13 DIAGNOSIS — Z515 Encounter for palliative care: Secondary | ICD-10-CM | POA: Diagnosis not present

## 2022-10-13 DIAGNOSIS — Z7189 Other specified counseling: Secondary | ICD-10-CM | POA: Diagnosis not present

## 2022-10-13 DIAGNOSIS — N179 Acute kidney failure, unspecified: Secondary | ICD-10-CM | POA: Diagnosis not present

## 2022-10-13 DIAGNOSIS — E785 Hyperlipidemia, unspecified: Secondary | ICD-10-CM

## 2022-10-13 DIAGNOSIS — E440 Moderate protein-calorie malnutrition: Secondary | ICD-10-CM | POA: Diagnosis present

## 2022-10-13 LAB — CBC
HCT: 39.1 % (ref 36.0–46.0)
Hemoglobin: 11.4 g/dL — ABNORMAL LOW (ref 12.0–15.0)
MCH: 24.7 pg — ABNORMAL LOW (ref 26.0–34.0)
MCHC: 29.2 g/dL — ABNORMAL LOW (ref 30.0–36.0)
MCV: 84.6 fL (ref 80.0–100.0)
Platelets: 175 10*3/uL (ref 150–400)
RBC: 4.62 MIL/uL (ref 3.87–5.11)
RDW: 20.3 % — ABNORMAL HIGH (ref 11.5–15.5)
WBC: 11.7 10*3/uL — ABNORMAL HIGH (ref 4.0–10.5)
nRBC: 0 % (ref 0.0–0.2)

## 2022-10-13 LAB — BASIC METABOLIC PANEL
Anion gap: 12 (ref 5–15)
BUN: 25 mg/dL — ABNORMAL HIGH (ref 8–23)
CO2: 22 mmol/L (ref 22–32)
Calcium: 7.2 mg/dL — ABNORMAL LOW (ref 8.9–10.3)
Chloride: 103 mmol/L (ref 98–111)
Creatinine, Ser: 1.66 mg/dL — ABNORMAL HIGH (ref 0.44–1.00)
GFR, Estimated: 29 mL/min — ABNORMAL LOW (ref >60)
Glucose, Bld: 82 mg/dL (ref 70–99)
Potassium: 4.1 mmol/L (ref 3.5–5.1)
Sodium: 137 mmol/L (ref 135–145)

## 2022-10-13 MED ORDER — OXYCODONE HCL 5 MG PO TABS
5.0000 mg | ORAL_TABLET | ORAL | Status: DC | PRN
  Administered 2022-10-13: 5 mg via ORAL
  Filled 2022-10-13: qty 1

## 2022-10-13 MED ORDER — GERHARDT'S BUTT CREAM
TOPICAL_CREAM | Freq: Two times a day (BID) | CUTANEOUS | Status: DC
  Filled 2022-10-13: qty 1

## 2022-10-13 MED ORDER — CEPHALEXIN 250 MG PO CAPS
250.0000 mg | ORAL_CAPSULE | Freq: Two times a day (BID) | ORAL | Status: DC
  Administered 2022-10-13 – 2022-10-14 (×2): 250 mg via ORAL
  Filled 2022-10-13 (×2): qty 1

## 2022-10-13 NOTE — Progress Notes (Signed)
OT Cancellation Note  Patient Details Name: MAJESTIC MOLONY MRN: [id number] DOB: 22-Apr-1930   Cancelled Treatment:    Reason Eval/Treat Not Completed: Other (comment) (Pt's daugther reports she is awaiting palliative meeting, declining OT work with pt at this time. Will s/o, please reconsult if pt's needs change.)  Renaye Rakers, OTD, OTR/L SecureChat Preferred Acute Rehab [phone]   Ulla Gallo 10/13/2022, 8:34 AM

## 2022-10-13 NOTE — Assessment & Plan Note (Signed)
Continue with atorvastatin 

## 2022-10-13 NOTE — Consult Note (Signed)
Consultation Note Date: 10/13/2022   Patient Name: Kelli Peterson  DOB: 1930-04-16  MRN: [id number]  Age / Sex: 89 y.o., female  PCP: Kelli Morning, DO Referring Physician: Tawni Peterson  Reason for Consultation: Establishing goals of care  HPI/Patient Profile: 89 y.o. female  with past medical history of ICH, chronic diastolic CHF, paroxysmal A-fib, hypertension, and chronic dysphagia admitted on 10/11/2022 with acute respiratory failure as well as acute on chronic diastolic CHF.  Patient with ongoing failure to thrive and severe debility.  PMT consulted to discuss goals of care.  Clinical Assessment and Goals of Care: I have reviewed medical records including EPIC notes, labs and imaging, assessed the patient and then met with patient's daughter/ Kelli Peterson to discuss diagnosis prognosis, GOC, EOL wishes, disposition and options.  I introduced Palliative Medicine as specialized medical care for people living with serious illness. It focuses on providing relief from the symptoms and stress of a serious illness. The goal is to improve quality of life for both the patient and the family.  As far as functional and nutritional status, Kelli Peterson tells me the patient has significantly declined over the past 6 months.  She tells me about loss of functional ability.  She tells me patient spends most of her time sleeping.  She also tells me about poor appetite.   We discussed patient's current illness and what it means in the larger context of patient's on-going co-morbidities.  Natural disease trajectory and expectations at EOL were discussed.  We discussed that her diseases are irreversible.  I attempted to elicit values and goals of care important to the patient.  Would like to avoid aggressive medical interventions.  The difference between aggressive medical intervention and comfort care was considered in light of the patient's goals of care.    Hospice services outpatient were explained and offered.  Discussed philosophy and type of care provided by hospice.  Daughter is interested in support of hospice at discharge.  Questions and concerns were addressed. The family was encouraged to call with questions or concerns.  Primary Decision Maker HCPOA -daughter Kelli Peterson    SUMMARY OF RECOMMENDATIONS   -DNR signed and placed on chart, copy given to daughter for home refrigerator -Referral to [facility] hospice for support at home per daughter's request - focus on comfort, continue current treatment to optimize for discharge while elevating comfort and minimizing medical procedures not necessary for comfort  Code Status/Advance Care Planning: DNR      Primary Diagnoses: Present on Admission:  Acute on chronic diastolic (congestive) heart failure ([place])  Acute respiratory failure with hypoxia ([place])  Hyperlipidemia  Glaucoma  Essential hypertension  CKD (chronic Peterson disease), stage III ([place])  Atrial fibrillation, chronic ([place])  Acute Peterson injury superimposed on chronic Peterson disease ([place])  Chronic pulmonary aspiration   I have reviewed the medical record, interviewed the patient and family, and examined the patient. The following aspects are pertinent.  Past Medical History:  Diagnosis Date   Adenomatous colon polyp 2007   Barrett's esophagus 2007   Cerebellar hemorrhage (HCC)    Cervical cancer (HCC)    Chronic diastolic CHF (congestive heart failure) (HCC)    CKD (chronic Peterson disease), stage II    Closed pelvic fracture (HCC)    DVT (deep venous thrombosis) (HCC)    Fibrocystic breast disease    Fracture of pubic ramus (HCC)    Hyperlipidemia    Hypertension    Iron deficiency anemia    Macular degeneration  Permanent atrial fibrillation (HCC)    Pulmonary hypertension (HCC)    Tricuspid regurgitation    Social History   Socioeconomic History   Marital status: Widowed    Spouse name: Not on  file   Number of children: Not on file   Years of education: Not on file   Highest education level: Not on file  Occupational History   Occupation: Retired  Tobacco Use   Smoking status: Never   Smokeless tobacco: Never  Substance and Sexual Activity   Alcohol use: No   Drug use: No   Sexual activity: Not on file  Other Topics Concern   Not on file  Social History Narrative   Not on file   Social Determinants of Health   Financial Resource Strain: Not on file  Food Insecurity: Not on file  Transportation Needs: Not on file  Physical Activity: Not on file  Stress: Not on file  Social Connections: Not on file   Family History  Problem Relation Age of Onset   CVA Father    CVA Mother    Scheduled Meds:  apixaban  2.5 mg Oral BID   atorvastatin  20 mg Oral Daily   brimonidine  1 drop Right Eye BID   docusate sodium  100 mg Oral BID   dorzolamide-timolol  1 drop Both Eyes BID   furosemide  40 mg Intravenous BID   latanoprost  1 drop Both Eyes QPM   metoprolol succinate  75 mg Oral Daily   sodium chloride flush  3 mL Intravenous Q12H   Continuous Infusions: PRN Meds:.acetaminophen **OR** acetaminophen, bisacodyl, hydrALAZINE, morphine injection, ondansetron **OR** ondansetron (ZOFRAN) IV, polyethylene glycol, traZODone Allergies  Allergen Reactions   Penicillins Swelling and Other (See Comments)    Swelling of ankles, feet   Zocor [Simvastatin] Other (See Comments)    Weakness  Dizziness    Ferosul [Ferrous Sulfate] Other (See Comments)    Severe constipation if given daily. Tolerates 2-3 times a week.   Nsaids Other (See Comments)    Tylenol is all that is permitted   Review of Systems  Unable to perform ROS: Other    Physical Exam Constitutional:      General: She is not in acute distress.    Appearance: She is ill-appearing.     Comments: lethargic  Pulmonary:     Effort: Pulmonary effort is normal.  Skin:    General: Skin is warm and dry.      Vital Signs: BP 96/67   Pulse (!) 122   Temp (!) 97.1 F (36.2 C) (Axillary)   Resp 20   Ht 5' (1.524 m)   Wt 52.6 kg   SpO2 100%   BMI 22.65 kg/m  Pain Scale: Faces POSS *See Group Information*: S-Acceptable,Sleep, easy to arouse Pain Score: 0-No pain   SpO2: SpO2: 100 % O2 Device:SpO2: 100 % O2 Flow Rate: .O2 Flow Rate (L/min): 4 L/min  IO: Intake/output summary:  Intake/Output Summary (Last 24 hours) at 10/13/2022 1141 Last data filed at 10/13/2022 0847 Gross per 24 hour  Intake 450 ml  Output 150 ml  Net 300 ml    LBM: Last BM Date : 10/11/22 Baseline Weight: Weight: 48.9 kg Most recent weight: Weight: 52.6 kg     Palliative Assessment/Data: PPS 30%    *Please note that this is a verbal dictation therefore any spelling or grammatical errors are due to the "[place]" system interpretation.   Juel Burrow, DNP, AGNP-C Palliative Medicine  Team [phone] Pager: [phone]

## 2022-10-13 NOTE — Assessment & Plan Note (Signed)
Patient with chronic aspiration.  Continue aspiration precautions.  Not candidate for tube feedings due to deconditioning and poor prognosis.  Advance directives per her daughter.

## 2022-10-13 NOTE — Assessment & Plan Note (Signed)
Continue eyedrops 

## 2022-10-13 NOTE — Progress Notes (Addendum)
Progress Note   Patient: Kelli Peterson AVW:[id number] DOB: Jul 25, 1930 DOA: 10/11/2022     2 DOS: the patient was seen and examined on 10/13/2022   Brief hospital course: Kelli Peterson was admitted to the hospital with the working diagnosis of decompensated heart failure.   89 yo female with the past medical history of heart failure, hypertension, dyslipidemia, and atrial fibrillation who presented with dyspnea. Very poor functional physical status, she is non ambulatory and has chronic dysphagia. At home patient having worsening fatigue, dyspnea and lower extremity edema. Her 02 saturation drooped to 60 to 80%, EMS was called and she was transported to the ED. On her initial physical examination her blood pressure was 114/81, HR 94, RR 16 and 02 saturation 97%, lungs with no wheezing, heart with S1 and S2 present irregularly irregular, abdomen with no distention, positive lower extremity edema.   Na 142, K 4,5 Cl 104 bicarbonate 26, glucose 104 bun 25 cr 1,70 BNP 612 Wbc 7,8 hgb 11.2 plt 300  Sars covid 19 negative  Urine analysis SG 1,010, 6-10 wbc Urine culture positive 100,000 CFU gram negative rods.   Chest radiograph with hypo-inflation, small left pleural effusion, increase bilateral lung markings.  EKG 98 bpm, normal axis, normal qtc, atrial fibrillation rhythm with no significant ST segment or T wave changes. Low voltage.   Patient was placed on furosemide for diuresis.  Patient with very poor prognosis, palliative care was consulted.  Her family decided to continue current medical care and discharge home with hospice services.       Assessment and Plan: * Acute on chronic diastolic (congestive) heart failure (HCC) Echocardiogram with preserved LV systolic function EF 55 to 53%, RV systolic function is preserved. RVSP 55.1 LA and RA with severe dilatation. Severe TR.   Acute on chronic core pulmonale.  Chronic pulmonary hypertension.   Systolic blood pressure 94 to 96  mmHg.  Continue metoprolol  Change furosemide to po.  Patient bed bound with significant poor physical functional capacity.  Poor prognosis with high risk for deterioration.  Plan to discharge patient home with home hospice.   Acute on chronic hypoxemic respiratory failure Follow up chest film with right rotation and small right pleural effusion.  Plan to continue supplemental 02 per [place] for comfort, and treat dyspnea.   Essential hypertension Continue metoprolol Change furosemide to po.   Atrial fibrillation, chronic (HCC) Positive RVR with rate 120 to 130 bpm. Patient in pain at the time of my examination due to infiltrated IV.  Poor prognosis.  Continue analgesics for comfort care.  Due to hypotension, limited pharmacological options.  Continue with telemetry monitoring.   Anticoagulation with apixaban.   Acute kidney injury superimposed on chronic kidney disease (HCC) CKD stage 3b.  Renal function with serum cr at 1.6, K is 4.1 and serum bicarbonate at 22.  Transition to oral furosemide as needed.  Patient with very poor oral intake and high risk for worsening renal failure.   Hyperlipidemia Continue with atorvastatin   Dysphagia Patient with chronic aspiration.  Continue aspiration precautions.  Not candidate for tube feedings due to deconditioning and poor prognosis.  Advance directives per her daughter.   Glaucoma Continue eye drops.   Malnutrition of moderate degree Nutritional support as tolerated.   COPD (chronic obstructive pulmonary disease) (HCC) Continue supplemental -02 per [place].    UTI (urinary tract infection) Positive pyuria and bacteriuria.  Add 3 days of oral cephalexin         Subjective:  patient deconditioned and ill looking appearing,  Physical Exam: Vitals:   10/13/22 0421 10/13/22 0425 10/13/22 0439 10/13/22 1035  BP: 95/78 94/71  96/67  Pulse: (!) 113 (!) 116  (!) 122  Resp: (!) 23 (!) '22 20 20  '$ Temp: (!) 97.2 F (36.2 C) (!)  97.2 F (36.2 C)  (!) 97.1 F (36.2 C)  TempSrc: Axillary Axillary  Axillary  SpO2:  100%  100%  Weight: 52.6 kg     Height:       Neurology eyes closed but able to answer simple questions ENT with mild pallor Cardiovascular with S1 and S2 present irregularly irregular with no gallops No JVD No lower extremity edema Respiratory with decreased breath sounds at bases with no wheezing Abdomen with no distention  Data Reviewed:    Family Communication: I spoke with patient's daughter at the bedside, we talked in detail about patient's condition, plan of care and prognosis and all questions were addressed.   Disposition: Status is: Inpatient Remains inpatient appropriate because: adjusting medical therapy, for possible discharge home tomorrow with home hospice.   Planned Discharge Destination: Home     Author: Tawni Millers, MD 10/13/2022 12:18 PM  For on call review [email or web address].

## 2022-10-13 NOTE — Progress Notes (Signed)
[place] [facility] Hospital Liaison Note   Received request from [facility], Tomi Bamberger, RN, for hospice services at home after discharge.    Spoke with patient's daughter, [name] to initiate education related to hospice philosophy, services, and team approach to care. [name] verbalized understanding of information given. Per discussion, the plan is for patient to discharge home once cleared to DC.    DME needs discussed. Patient has the following equipment in the home (Purchased privately): None Patient requests the following equipment for delivery: Hospital bed and oxygen   Address verified and is correct in the chart. [name] is the family member to contact to arrange time of equipment delivery.    Please send signed and completed DNR home with patient/family. Please provide prescriptions at discharge as needed to ensure ongoing symptom management.    [facility] information and contact numbers given to family & above information shared with TOC.   Please call with any questions/concerns.    Thank you for the opportunity to participate in this patient's care.   Zigmund Gottron  [facility] Liaison  [phone]

## 2022-10-13 NOTE — Progress Notes (Signed)
Patient found at shift change sleeping.  Very difficult to wake but she did wake to voice and touch combination.  She opened her eyes briefly and nodded when RN told her his name.  She did not speak and closed her eyes again.  HR showing 125-130 on monitor.  She appears in no distress at this time, but unless she is able to wake up to protect her airway, oral meds cannot be given.  O2 97% and RR 26.

## 2022-10-13 NOTE — Assessment & Plan Note (Addendum)
Positive RVR with rate 120 to 130 bpm. Patient in pain at the time of my examination due to infiltrated IV.  Poor prognosis.  Continue analgesics for comfort care.  Due to hypotension, limited pharmacological options.  Continue with telemetry monitoring.   Anticoagulation with apixaban.

## 2022-10-13 NOTE — Assessment & Plan Note (Signed)
CKD stage 3b.  Renal function with serum cr at 1.6, K is 4.1 and serum bicarbonate at 22.  Transition to oral furosemide as needed.  Patient with very poor oral intake and high risk for worsening renal failure.

## 2022-10-13 NOTE — Assessment & Plan Note (Signed)
Continue metoprolol Change furosemide to po.

## 2022-10-13 NOTE — TOC Progression Note (Addendum)
Transition of Care [facility]) - Progression Note    Patient Details  Name: Kelli Peterson MRN: [id number] Date of Birth: 12/22/1929  [facility]) CM/SW Contact  Zenon Mayo, RN Phone Number: 10/13/2022, 12:01 PM  Clinical Narrative:    Palliative spoke with daughter she states they want [facility] for home hospice,  NCM confirmed this information, with daughter and confirmed address patient will be going to, she will need ambulance transport at discharge. Sarah with [facility] has been in contact with daughter. Copay for eliquis will be 47.00 per the pharmacist, as it is also for the xarelto which a benefit check was done.        Expected Discharge Plan and Services                                               Social Determinants of Health (SDOH) Interventions SDOH Screenings   Tobacco Use: Low Risk  (10/11/2022)    Readmission Risk Interventions     No data to display

## 2022-10-13 NOTE — Assessment & Plan Note (Signed)
Positive pyuria and bacteriuria.  Plan for 3 days of oral cephalexin

## 2022-10-13 NOTE — Assessment & Plan Note (Addendum)
Echocardiogram with preserved LV systolic function EF 55 to 92%, RV systolic function is preserved. RVSP 55.1 LA and RA with severe dilatation. Severe TR.   Acute on chronic core pulmonale.  Chronic pulmonary hypertension.   Systolic blood pressure 94 to 96 mmHg.  Continue metoprolol  Change furosemide to po.  Patient bed bound with significant poor physical functional capacity.  Poor prognosis with high risk for deterioration.  Plan to discharge patient home with home hospice.   Acute on chronic hypoxemic respiratory failure Follow up chest film with right rotation and small right pleural effusion.  Plan to continue supplemental 02 per [place] for comfort, and treat dyspnea.

## 2022-10-13 NOTE — Progress Notes (Signed)
PT Cancellation & Discharge Note  Patient Details Name: Kelli Peterson MRN: [id number] DOB: 08-08-1930   Cancelled Treatment:    Reason Eval/Treat Not Completed: Other (comment). Daughter reporting she is awaiting Palliative Consult and [place] meeting. Daughter expressing appreciation for therapy attempts but declining desire for pt to mobilize or work on strengthening at this time. Briefly educated daughter on ROM exercises for extremities and pressure relief techniques to prevent pressure wounds. She verbalized understanding and appreciation of recommendations. Discussed DME at home and daughter reports already having all the DME they need, including a RW, w/c, and 3in1. Pt sleeps in a recliner. Daughter expressed desire for PT to sign off at this time and if plans change she will request staff to re-consult PT then.    Moishe Spice, PT, DPT Acute Rehabilitation Services  Office: [place] 10/13/2022, 8:06 AM

## 2022-10-13 NOTE — Assessment & Plan Note (Signed)
Nutritional support as tolerated.

## 2022-10-13 NOTE — Assessment & Plan Note (Signed)
Continue supplemental -02 per [place].

## 2022-10-13 NOTE — Hospital Course (Signed)
Kelli Peterson was admitted to the hospital with the working diagnosis of decompensated heart failure.   89 yo female with the past medical history of heart failure, hypertension, dyslipidemia, and atrial fibrillation who presented with dyspnea. Very poor functional physical status, she is non ambulatory and has chronic dysphagia. At home patient having worsening fatigue, dyspnea and lower extremity edema. Her 02 saturation drooped to 60 to 80%, EMS was called and she was transported to the ED. On her initial physical examination her blood pressure was 114/81, HR 94, RR 16 and 02 saturation 97%, lungs with no wheezing, heart with S1 and S2 present irregularly irregular, abdomen with no distention, positive lower extremity edema.   Na 142, K 4,5 Cl 104 bicarbonate 26, glucose 104 bun 25 cr 1,70 BNP 612 Wbc 7,8 hgb 11.2 plt 300  Sars covid 19 negative  Urine analysis SG 1,010, 6-10 wbc Urine culture positive 100,000 CFU gram negative rods.   Chest radiograph with hypo-inflation, small left pleural effusion, increase bilateral lung markings.  EKG 98 bpm, normal axis, normal qtc, atrial fibrillation rhythm with no significant ST segment or T wave changes. Low voltage.   Patient was placed on furosemide for diuresis.  Patient with very poor prognosis, palliative care was consulted.  Her family decided to continue current medical care and discharge home with hospice services.   Patient with uncontrolled atrial fibrillation, she has been agitated at night. I spoke with her daughter that patient is worsening despite medical care. Death can become imminent.  Her daughter understands this situation and would like to have patient at home, and continue with hospice services.

## 2022-10-14 ENCOUNTER — Other Ambulatory Visit (HOSPITAL_COMMUNITY): Payer: Self-pay

## 2022-10-14 DIAGNOSIS — I482 Chronic atrial fibrillation, unspecified: Secondary | ICD-10-CM | POA: Diagnosis not present

## 2022-10-14 DIAGNOSIS — I10 Essential (primary) hypertension: Secondary | ICD-10-CM | POA: Diagnosis not present

## 2022-10-14 DIAGNOSIS — N179 Acute kidney failure, unspecified: Secondary | ICD-10-CM | POA: Diagnosis not present

## 2022-10-14 DIAGNOSIS — N3000 Acute cystitis without hematuria: Secondary | ICD-10-CM

## 2022-10-14 DIAGNOSIS — H401132 Primary open-angle glaucoma, bilateral, moderate stage: Secondary | ICD-10-CM

## 2022-10-14 DIAGNOSIS — I5033 Acute on chronic diastolic (congestive) heart failure: Secondary | ICD-10-CM | POA: Diagnosis not present

## 2022-10-14 LAB — URINE CULTURE: Culture: >=100000 — AB

## 2022-10-14 MED ORDER — LORAZEPAM 1 MG PO TABS
1.0000 mg | ORAL_TABLET | ORAL | 0 refills | Status: DC | PRN
  Filled 2022-10-14: qty 10, 3d supply, fill #0

## 2022-10-14 MED ORDER — TRAZODONE HCL 50 MG PO TABS
25.0000 mg | ORAL_TABLET | Freq: Every evening | ORAL | 0 refills | Status: DC | PRN
  Filled 2022-10-14: qty 30, 60d supply, fill #0

## 2022-10-14 MED ORDER — LORAZEPAM 1 MG PO TABS
1.0000 mg | ORAL_TABLET | ORAL | Status: DC | PRN
  Administered 2022-10-14: 1 mg via ORAL
  Filled 2022-10-14: qty 1

## 2022-10-14 MED ORDER — CEPHALEXIN 250 MG PO CAPS
250.0000 mg | ORAL_CAPSULE | Freq: Two times a day (BID) | ORAL | 0 refills | Status: AC
  Filled 2022-10-14: qty 4, 2d supply, fill #0

## 2022-10-14 MED ORDER — OXYCODONE HCL 5 MG PO TABS
5.0000 mg | ORAL_TABLET | Freq: Four times a day (QID) | ORAL | 0 refills | Status: DC | PRN
  Filled 2022-10-14: qty 10, 3d supply, fill #0

## 2022-10-14 MED ORDER — METOPROLOL SUCCINATE ER 25 MG PO TB24
75.0000 mg | ORAL_TABLET | Freq: Every day | ORAL | 0 refills | Status: AC
  Filled 2022-10-14: qty 90, 30d supply, fill #0

## 2022-10-14 MED ORDER — DILTIAZEM HCL ER COATED BEADS 240 MG PO CP24
240.0000 mg | ORAL_CAPSULE | Freq: Every day | ORAL | Status: DC
  Administered 2022-10-14: 240 mg via ORAL
  Filled 2022-10-14: qty 1

## 2022-10-14 NOTE — Progress Notes (Signed)
Patient having repeated episodes of apnea up to 10-12 seconds at a time followed by incoherent yelling.  RN asked if the patient was in pain, and she was able to nod "no".  RN asked if she wanted anything to make her more comfortable, and again patient nodded "no".  RN lifted patient higher in the bed for comfort and patient was able to mumble "thank you".  Will continue to monitor.  Patient appears to be in a natural end of life process.

## 2022-10-14 NOTE — Progress Notes (Signed)
Patient has yelled on/off throughout the night but is easily consoled with RN presence in the room for calming conversation. She was able to verbalize "thank you" when she received eye drops and was pulled up into the bed.  She yelled "momma" for a period of time and stated the word "water".  RN attempted to give her water (thickened) and she was unable to pull it through a straw.   RN removed the lid from the cup so that she could attempt a sip and she immediately began coughing.  Water removed.  Patient appears in restless distress but has no palliative medications ordered and cannot safely swallow pills.  She continues to have extended periods of apnea.

## 2022-10-14 NOTE — TOC Progression Note (Signed)
Transition of Care [facility]) - Progression Note    Patient Details  Name: Kelli Peterson MRN: [id number] Date of Birth: July 28, 1930  Transition of Care [facility]) CM/SW Contact  Zenon Mayo, RN Phone Number: 10/14/2022, 12:24 PM  Clinical Narrative:     Per Landry Mellow with [facility] the DME has been delivered to patient's home.  NCM will leave ambulance packet on unit.        Expected Discharge Plan and Services                                               Social Determinants of Health (SDOH) Interventions SDOH Screenings   Tobacco Use: Low Risk  (10/11/2022)    Readmission Risk Interventions     No data to display

## 2022-10-14 NOTE — Progress Notes (Signed)
Pt discharge education and instructions completed. Pt discharged home with hospice. Pt cleaned up and ready to be picked up by [facility]. [facility] to transport pt to disposition. Will continue to monitor till pick up. P. Angelica Pou RN

## 2022-10-14 NOTE — Discharge Summary (Addendum)
Physician Discharge Summary   Patient: Kelli Peterson MRN: [id number] DOB: 03-Feb-1930  Admit date:     10/11/2022  Discharge date: 10/14/22  Discharge Physician: Jimmy Picket Nayan Proch   PCP: [name], DO   Recommendations at discharge:    Kelli Peterson is being discharge home with hospice services. Hospital bed and supplemental home 02 for comfort.  Continue torsemide for diuresis. Increased metoprolol to 75 mg XL to atrial fibrillation rate control. Continue apixaban for anticoagulation. Added oxycodone and lorazepam as needed for pain and anxiety symptoms.   I spoke with patient's daughter at the bedside, we talked in detail about patient's condition, plan of care and prognosis and all questions were addressed.   Discharge Diagnoses: Principal Problem:   Acute on chronic diastolic (congestive) heart failure (HCC) Active Problems:   Essential hypertension   Atrial fibrillation, chronic (HCC)   Acute kidney injury superimposed on chronic kidney disease (HCC)   Hyperlipidemia   Dysphagia   Glaucoma   Malnutrition of moderate degree   COPD (chronic obstructive pulmonary disease) (HCC)   UTI (urinary tract infection)  Resolved Problems:   * No resolved hospital problems. [facility] Course: Kelli Peterson was admitted to the hospital with the working diagnosis of decompensated heart failure.   89 yo female with the past medical history of heart failure, hypertension, dyslipidemia, and atrial fibrillation who presented with dyspnea. Very poor functional physical status, she is non ambulatory and has chronic dysphagia. At home patient having worsening fatigue, dyspnea and lower extremity edema. Her 02 saturation drooped to 60 to 80%, EMS was called and she was transported to the ED. On her initial physical examination her blood pressure was 114/81, HR 94, RR 16 and 02 saturation 97%, lungs with no wheezing, heart with S1 and S2 present irregularly irregular, abdomen with no  distention, positive lower extremity edema.   Na 142, K 4,5 Cl 104 bicarbonate 26, glucose 104 bun 25 cr 1,70 BNP 612 Wbc 7,8 hgb 11.2 plt 300  Sars covid 19 negative  Urine analysis SG 1,010, 6-10 wbc Urine culture positive 100,000 CFU gram negative rods.   Chest radiograph with hypo-inflation, small left pleural effusion, increase bilateral lung markings.  EKG 98 bpm, normal axis, normal qtc, atrial fibrillation rhythm with no significant ST segment or T wave changes. Low voltage.   Patient was placed on furosemide for diuresis.  Patient with very poor prognosis, palliative care was consulted.  Her family decided to continue current medical care and discharge home with hospice services.   Patient with uncontrolled atrial fibrillation, she has been agitated at night. I spoke with her daughter that patient is worsening despite medical care. Death can become imminent.  Her daughter understands this situation and would like to have patient at home, and continue with hospice services.       Assessment and Plan: * Acute on chronic diastolic (congestive) heart failure (HCC) Echocardiogram with preserved LV systolic function EF 55 to 31%, RV systolic function is preserved. RVSP 55.1 LA and RA with severe dilatation. Severe TR.   Acute on chronic core pulmonale.  Chronic pulmonary hypertension.   Patient had furosemide IV for diuresis, her volume status improved, along with her symptoms.   Patient bed bound with significant poor physical functional capacity.  Poor prognosis with high risk for deterioration.  Plan to discharge patient home with home hospice.  Add as needed oxycodone and lorazepam.  Continue torsemide 20 mg po daily.   Acute on chronic hypoxemic  respiratory failure Follow up chest film with right rotation and small right pleural effusion.  Plan to continue supplemental 02 per [place] for comfort, and treat dyspnea.   Essential hypertension Blood pressure has improved,  will continue with metoprolol and diltiazem.   Atrial fibrillation, chronic ([place]) Patient has remained in atrial fibrillation rate 120 to 130 bpm. Intermittent agitation.  Blood pressure is better today at 123 to 109 bpm.  Will resume diltiazem and will continue with metoprolol.   Anticoagulation with apixaban.   Acute kidney injury superimposed on chronic kidney disease (HCC) CKD stage 3b.  Renal function with serum cr at 1.6, K is 4.1 and serum bicarbonate at 22.   Volume status has improved, plan to continue torsemide to keep negative fluid balance.  Hold on K supplementation to prevent hyperkalemia.   Hyperlipidemia Continue with atorvastatin   Dysphagia Patient with chronic aspiration.  Continue aspiration precautions.  Not candidate for tube feedings due to deconditioning and poor prognosis.  Advance directives per her daughter.   Glaucoma Continue eye drops.   Malnutrition of moderate degree Nutritional support as tolerated.   COPD (chronic obstructive pulmonary disease) (HCC) Continue supplemental -02 per [place].    UTI (urinary tract infection) Positive pyuria and bacteriuria.  Plan for 3 days of oral cephalexin          Consultants: Palliative care  Procedures performed: none   Disposition: Home Diet recommendation:  Discharge Diet Orders (From admission, onward)     Start     Ordered   10/14/22 0000  Diet - low sodium heart healthy        10/14/22 1250           Regular diet DISCHARGE MEDICATION: Allergies as of 10/14/2022       Reactions   Penicillins Swelling, Other (See Comments)   Swelling of ankles, feet   Zocor [simvastatin] Other (See Comments)   Weakness  Dizziness    Ferosul [ferrous Sulfate] Other (See Comments)   Severe constipation if given daily. Tolerates 2-3 times a week.   Nsaids Other (See Comments)   Tylenol is all that is permitted        Medication List     STOP taking these medications    atorvastatin 20  MG tablet Commonly known as: LIPITOR   FeroSul 325 (65 FE) MG tablet Generic drug: ferrous sulfate   potassium chloride 10 MEQ tablet Commonly known as: KLOR-CON   Womens 50+ Multi Vitamin Tabs       TAKE these medications    acetaminophen 500 MG tablet Commonly known as: TYLENOL Take 2 tablets (1,000 mg total) by mouth 3 (three) times daily. What changed:  when to take this reasons to take this   apixaban 2.5 MG Tabs tablet Commonly known as: ELIQUIS Take 1 tablet (2.5 mg total) by mouth 2 (two) times daily.   brimonidine 0.2 % ophthalmic solution Commonly known as: ALPHAGAN Place 1 drop into the right eye 2 (two) times daily. What changed:  how to take this when to take this additional instructions   diltiazem 240 MG 24 hr capsule Commonly known as: CARDIZEM CD Take 1 capsule (240 mg total) by mouth daily. What changed: when to take this   dorzolamide-timolol 2-0.5 % ophthalmic solution Commonly known as: COSOPT Place 1 drop into both eyes 2 (two) times daily. What changed:  when to take this additional instructions   famotidine 20 MG tablet Commonly known as: PEPCID Take 20 mg by mouth at  bedtime.   latanoprost 0.005 % ophthalmic solution Commonly known as: XALATAN Place 1 drop into both eyes every evening.   LORazepam 1 MG tablet Commonly known as: ATIVAN Take 1 tablet (1 mg total) by mouth every 4 (four) hours as needed for anxiety.   metoprolol succinate 25 MG 24 hr tablet Commonly known as: TOPROL-XL Take 3 tablets (75 mg total) by mouth daily. Start taking on: October 15, 2022 What changed: how much to take   oxyCODONE 5 MG immediate release tablet Commonly known as: Oxy IR/ROXICODONE Take 1 tablet (5 mg total) by mouth every 6 (six) hours as needed for severe pain.   torsemide 20 MG tablet Commonly known as: DEMADEX Take 1 tablet (20 mg total) by mouth daily. What changed: when to take this   traZODone 50 MG tablet Commonly known as:  DESYREL Take 0.5 tablets (25 mg total) by mouth at bedtime as needed for sleep.        Follow-up Information     [facility] Follow up.   Specialty: Hospice and Palliative Medicine Why: Home Hospice Contact information: [place] [phone]               Discharge Exam: Danley Danker Weights   10/12/22 0458 10/12/22 0509 10/13/22 0421  Weight: 49.9 kg 49.8 kg 52.6 kg   BP 109/80 (BP Location: Left Arm)   Pulse (!) 120   Temp 97.7 F (36.5 C) (Axillary)   Resp 20   Ht 5' (1.524 m)   Wt 52.6 kg   SpO2 96%   BMI 22.65 kg/m   Patient deconditioned and ill looking appearing, denies chest pain or dyspnea.   Neurology awake and alert ENT with mild pallor Cardiovascular with S1 and S2 present, irregularly irregular, with no gallops. No JVD No lower extremity edema Respiratory with no rales or wheezing Abdomen with no distention   Condition at discharge: stable  The results of significant diagnostics from this hospitalization (including imaging, microbiology, ancillary and laboratory) are listed below for reference.   Imaging Studies: DG CHEST PORT 1 VIEW  Result Date: 10/12/2022 CLINICAL DATA:  Hypoxia EXAM: PORTABLE CHEST 1 VIEW COMPARISON:  10/11/2022 and older FINDINGS: Enlarged cardiopericardial silhouette. Calcified and tortuous aorta. Slight increase in small right pleural effusion. Stable small left. Adjacent parenchymal opacities. No pneumothorax. Diffuse osteopenia. Overlapping cardiac leads. IMPRESSION: Slight increase in right-sided pleural effusion. Electronically Signed   By: Jill Side M.D.   On: 10/12/2022 10:39   DG Chest Port 1 View  Result Date: 10/11/2022 CLINICAL DATA:  Fatigue. EXAM: PORTABLE CHEST 1 VIEW COMPARISON:  Chest x-ray dated Feb 23, 2021. FINDINGS: Chronic cardiomegaly. Increased pulmonary vascular congestion and mild diffuse interstitial thickening. New small bilateral pleural effusions and  bibasilar atelectasis. No pneumothorax. No acute osseous abnormality. IMPRESSION: 1. Mild congestive heart failure. Electronically Signed   By: Titus Dubin M.D.   On: 10/11/2022 12:28    Microbiology: Results for orders placed or performed during the hospital encounter of 10/11/22  Resp panel by RT-PCR (RSV, Flu A&B, Covid) Anterior Nasal Swab     Status: None   Collection Time: 10/11/22 11:20 AM   Specimen: Anterior Nasal Swab  Result Value Ref Range Status   SARS Coronavirus 2 by RT PCR NEGATIVE NEGATIVE Final    Comment: (NOTE) SARS-CoV-2 target nucleic acids are NOT DETECTED.  The SARS-CoV-2 RNA is generally detectable in upper respiratory specimens during the acute phase of infection. The lowest concentration of  SARS-CoV-2 viral copies this assay can detect is 138 copies/mL. A negative result does not preclude SARS-Cov-2 infection and should not be used as the sole basis for treatment or other patient management decisions. A negative result may occur with  improper specimen collection/handling, submission of specimen other than nasopharyngeal swab, presence of viral mutation(s) within the areas targeted by this assay, and inadequate number of viral copies(<138 copies/mL). A negative result must be combined with clinical observations, patient history, and epidemiological information. The expected result is Negative.  Fact Sheet for Patients:  EntrepreneurPulse.com.au  Fact Sheet for Healthcare Providers:  IncredibleEmployment.be  This test is no t yet approved or cleared by the [place] FDA and  has been authorized for detection and/or diagnosis of SARS-CoV-2 by FDA under an Emergency Use Authorization (EUA). This EUA will remain  in effect (meaning this test can be used) for the duration of the COVID-19 declaration under Section 564(b)(1) of the Act, 21 U.S.C.section 360bbb-3(b)(1), unless the authorization is terminated  or  revoked sooner.       Influenza A by PCR NEGATIVE NEGATIVE Final   Influenza B by PCR NEGATIVE NEGATIVE Final    Comment: (NOTE) The Xpert Xpress SARS-CoV-2/FLU/RSV plus assay is intended as an aid in the diagnosis of influenza from Nasopharyngeal swab specimens and should not be used as a sole basis for treatment. Nasal washings and aspirates are unacceptable for Xpert Xpress SARS-CoV-2/FLU/RSV testing.  Fact Sheet for Patients: EntrepreneurPulse.com.au  Fact Sheet for Healthcare Providers: IncredibleEmployment.be  This test is not yet approved or cleared by the [place] FDA and has been authorized for detection and/or diagnosis of SARS-CoV-2 by FDA under an Emergency Use Authorization (EUA). This EUA will remain in effect (meaning this test can be used) for the duration of the COVID-19 declaration under Section 564(b)(1) of the Act, 21 U.S.C. section 360bbb-3(b)(1), unless the authorization is terminated or revoked.     Resp Syncytial Virus by PCR NEGATIVE NEGATIVE Final    Comment: (NOTE) Fact Sheet for Patients: EntrepreneurPulse.com.au  Fact Sheet for Healthcare Providers: IncredibleEmployment.be  This test is not yet approved or cleared by the [place] FDA and has been authorized for detection and/or diagnosis of SARS-CoV-2 by FDA under an Emergency Use Authorization (EUA). This EUA will remain in effect (meaning this test can be used) for the duration of the COVID-19 declaration under Section 564(b)(1) of the Act, 21 U.S.C. section 360bbb-3(b)(1), unless the authorization is terminated or revoked.  Performed at [facility], [place] [address], [place], [place] [address]   Urine Culture     Status: Abnormal   Collection Time: 10/11/22 11:22 AM   Specimen: In/Out Cath Urine  Result Value Ref Range Status   Specimen Description IN/OUT CATH URINE  Final   Special Requests   Final     NONE Performed at [facility], [place] [address], [place], [place] [address]    Culture >=100,000 COLONIES/mL ESCHERICHIA COLI (A)  Final   Report Status 10/14/2022 FINAL  Final   Organism ID, Bacteria ESCHERICHIA COLI (A)  Final      Susceptibility   Escherichia coli - MIC*    AMPICILLIN <=2 SENSITIVE Sensitive     CEFAZOLIN <=4 SENSITIVE Sensitive     CEFEPIME <=0.12 SENSITIVE Sensitive     CEFTRIAXONE <=0.25 SENSITIVE Sensitive     CIPROFLOXACIN <=0.25 SENSITIVE Sensitive     GENTAMICIN <=1 SENSITIVE Sensitive     IMIPENEM <=0.25 SENSITIVE Sensitive  NITROFURANTOIN <=16 SENSITIVE Sensitive     TRIMETH/SULFA <=20 SENSITIVE Sensitive     AMPICILLIN/SULBACTAM <=2 SENSITIVE Sensitive     PIP/TAZO <=4 SENSITIVE Sensitive     * >=100,000 COLONIES/mL ESCHERICHIA COLI  Blood Culture (routine x 2)     Status: None (Preliminary result)   Collection Time: 10/11/22 11:55 AM   Specimen: BLOOD RIGHT FOREARM  Result Value Ref Range Status   Specimen Description BLOOD RIGHT FOREARM  Final   Special Requests   Final    BOTTLES DRAWN AEROBIC ONLY Blood Culture results may not be optimal due to an inadequate volume of blood received in culture bottles   Culture   Final    NO GROWTH 3 DAYS Performed at [facility], [place] [address] [place], [place] [address]    Report Status PENDING  Incomplete  Blood Culture (routine x 2)     Status: None (Preliminary result)   Collection Time: 10/11/22 12:10 PM   Specimen: BLOOD  Result Value Ref Range Status   Specimen Description BLOOD LEFT ANTECUBITAL  Final   Special Requests   Final    BOTTLES DRAWN AEROBIC AND ANAEROBIC Blood Culture results may not be optimal due to an inadequate volume of blood received in culture bottles   Culture   Final    NO GROWTH 3 DAYS Performed at [facility], [place] [address] [place], [place] [address]    Report Status PENDING  Incomplete    Labs: CBC: Recent Labs  Lab 10/11/22 1218  10/11/22 1438 10/12/22 0058 10/13/22 0044  WBC  --  7.8 8.8 11.7*  NEUTROABS  --  6.3  --   --   HGB 12.9 11.2* 9.5* 11.4*  HCT 38.0 37.6 33.1* 39.1  MCV  --  84.3 84.0 84.6  PLT  --  300 269 003   Basic Metabolic Panel: Recent Labs  Lab 10/11/22 1218 10/11/22 1438 10/12/22 0058 10/13/22 0044  NA 142 141 140 137  K 4.5 4.3 4.1 4.1  CL 104 105 106 103  CO2  --  '26 25 22  '$ GLUCOSE 104* 96 99 82  BUN 25* 23 24* 25*  CREATININE 1.70* 1.52* 1.55* 1.66*  CALCIUM  --  7.9* 7.4* 7.2*  MG  --  2.5*  --   --    Liver Function Tests: Recent Labs  Lab 10/11/22 1438  AST 20  ALT 12  ALKPHOS 77  BILITOT 0.7  PROT 7.0  ALBUMIN 2.5*   CBG: No results for input(s): "GLUCAP" in the last 168 hours.  Discharge time spent: greater than 30 minutes.  Signed: Tawni Millers, MD [facility] 10/14/2022

## 2022-10-14 NOTE — TOC Transition Note (Addendum)
Transition of Care [facility]) - CM/SW Discharge Note   Patient Details  Name: Kelli Peterson MRN: [id number] Date of Birth: 09-Dec-1929  [facility] CM/SW Contact:  Zenon Mayo, RN Phone Number: 10/14/2022, 12:27 PM   Clinical Narrative:    Patient for possible dc home with hospice today, NCM will leave ambulance packet on unit.  Address confirmed.  PTAR scheduled for 2pm , packet on unit.  Daughter notified of dc , she states to schedule PTAR for 2 pm to give her time to get home.    Final next level of care: Home w Hospice Care Barriers to Discharge: No Barriers Identified   Patient Goals and CMS Choice CMS Medicare.gov Compare Post Acute Care list provided to:: Patient Represenative (must comment) Choice offered to / list presented to : Adult Children  Discharge Placement                         Discharge Plan and Services Additional resources added to the After Visit Summary for                  DME Arranged:  (Hospice supplying DME)         HH Arranged: RN HH Agency:  ([facility]) Date [place]: 10/13/22 Time [place]: 1300 Representative spoke with at [place]: [place] Determinants of Health ([place]) Interventions SDOH Screenings   Tobacco Use: Low Risk  (10/11/2022)     Readmission Risk Interventions     No data to display

## 2022-10-14 NOTE — Progress Notes (Signed)
Pt home med delivered to bedside. Pt now picked up by PTAR to be transported home. Pt transported off unit via stretcher with belongings including medications to the side. Delia Heady RN

## 2022-10-16 LAB — CULTURE, BLOOD (ROUTINE X 2)
Culture: NO GROWTH
Culture: NO GROWTH

## 2022-11-04 DEATH — deceased
